# Patient Record
Sex: Female | Born: 1975
Health system: Southern US, Community
[De-identification: ages and names within clinical notes are randomized; demographics above are authoritative.]

## PROBLEM LIST (undated history)

## (undated) ENCOUNTER — Inpatient Hospital Stay (HOSPITAL_COMMUNITY): Payer: Self-pay

## (undated) DIAGNOSIS — R112 Nausea with vomiting, unspecified: Secondary | ICD-10-CM

## (undated) DIAGNOSIS — Z9889 Other specified postprocedural states: Secondary | ICD-10-CM

## (undated) DIAGNOSIS — O26619 Liver and biliary tract disorders in pregnancy, unspecified trimester: Secondary | ICD-10-CM

## (undated) DIAGNOSIS — N809 Endometriosis, unspecified: Secondary | ICD-10-CM

## (undated) DIAGNOSIS — C9590 Leukemia, unspecified not having achieved remission: Secondary | ICD-10-CM

## (undated) DIAGNOSIS — Z923 Personal history of irradiation: Secondary | ICD-10-CM

## (undated) DIAGNOSIS — Z800 Family history of malignant neoplasm of digestive organs: Secondary | ICD-10-CM

## (undated) DIAGNOSIS — Z806 Family history of leukemia: Secondary | ICD-10-CM

## (undated) DIAGNOSIS — Z5189 Encounter for other specified aftercare: Secondary | ICD-10-CM

## (undated) DIAGNOSIS — C50919 Malignant neoplasm of unspecified site of unspecified female breast: Secondary | ICD-10-CM

## (undated) DIAGNOSIS — D219 Benign neoplasm of connective and other soft tissue, unspecified: Secondary | ICD-10-CM

## (undated) DIAGNOSIS — K831 Obstruction of bile duct: Secondary | ICD-10-CM

## (undated) HISTORY — DX: Endometriosis, unspecified: N80.9

## (undated) HISTORY — PX: NO PAST SURGERIES: SHX2092

## (undated) HISTORY — DX: Benign neoplasm of connective and other soft tissue, unspecified: D21.9

## (undated) HISTORY — DX: Malignant neoplasm of unspecified site of unspecified female breast: C50.919

## (undated) HISTORY — DX: Family history of leukemia: Z80.6

## (undated) HISTORY — DX: Encounter for other specified aftercare: Z51.89

## (undated) HISTORY — DX: Family history of malignant neoplasm of digestive organs: Z80.0

---
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
## 2009-05-22 ENCOUNTER — Encounter (INDEPENDENT_AMBULATORY_CARE_PROVIDER_SITE_OTHER): Payer: Self-pay | Admitting: *Deleted

## 2009-05-22 ENCOUNTER — Ambulatory Visit (HOSPITAL_COMMUNITY): Admission: RE | Admit: 2009-05-22 | Discharge: 2009-05-22 | Payer: Self-pay | Admitting: *Deleted

## 2011-04-09 NOTE — Op Note (Signed)
NAMEALONZO, LOVING NO.:  [id number]   MEDICAL RECORD NO.:  [id number]          PATIENT TYPE:  AMB   LOCATION:  ENDO                         FACILITY:  [facility]   PHYSICIAN:  Georgiana Spinner, M.D.    DATE OF BIRTH:  04-06-1976   DATE OF PROCEDURE:  DATE OF DISCHARGE:                               OPERATIVE REPORT   PROCEDURE:  Colonoscopy.   INDICATIONS:  Colon cancer screening.  Mother with history of colon  cancer at an early age.   ANESTHESIA:  Fentanyl 125 mcg, Versed 12 mg.   PROCEDURE:  With the patient mildly sedated in the left lateral  decubitus position the Pentax videoscopic pediatric colonoscope was  inserted in the rectum and passed under direct vision to the cecum,  identified by ileocecal valve and appendiceal orifice, crow's foot of  the cecum all of which was photographed.  From this point, the  colonoscope was slowly withdrawn taking circumferential views of the  colonic mucosa stopping first at 50 cm from the anal verge at which  point a polyp was seen, photographed and removed using hot biopsy  forceps technique setting of 20/150 blended current.  We removed all the  tissue that we could and removed it in its entirety.  Then we withdrew  to the rectum where a second polyp was seen.  It too was photographed  and it too was removed, but this time using snare cautery technique with  the same setting of 20/150 blended current.  The endoscope was then  placed in retroflexion to view the anal canal from above.  The endoscope  was straightened and withdrawn.  The patient's vital signs and pulse  oximeter remained stable.  The patient tolerated the procedure well  without apparent complication.   FINDINGS:  1. Polyp of rectum.  2. Polyp at 50 cm from anal verge.   PLAN:  Await biopsy report.  The patient will call me for results and  follow-up with me as an outpatient.           ______________________________  Georgiana Spinner, M.D.     GMO/MEDQ  D:  05/22/2009  T:  05/22/2009  Job:  [id number]

## 2013-03-30 ENCOUNTER — Other Ambulatory Visit: Payer: Self-pay | Admitting: Obstetrics and Gynecology

## 2013-04-07 ENCOUNTER — Other Ambulatory Visit: Payer: Self-pay | Admitting: Internal Medicine

## 2013-04-07 DIAGNOSIS — E049 Nontoxic goiter, unspecified: Secondary | ICD-10-CM

## 2013-04-08 ENCOUNTER — Other Ambulatory Visit: Payer: Self-pay

## 2013-04-12 ENCOUNTER — Ambulatory Visit
Admission: RE | Admit: 2013-04-12 | Discharge: 2013-04-12 | Disposition: A | Discharge status: Home / Self Care | Payer: BC Managed Care – PPO | Source: Ambulatory Visit | Attending: Internal Medicine | Admitting: Internal Medicine

## 2013-04-12 DIAGNOSIS — E049 Nontoxic goiter, unspecified: Secondary | ICD-10-CM

## 2014-02-28 ENCOUNTER — Other Ambulatory Visit (HOSPITAL_COMMUNITY): Payer: Self-pay | Admitting: Endocrinology

## 2014-02-28 DIAGNOSIS — E0590 Thyrotoxicosis, unspecified without thyrotoxic crisis or storm: Secondary | ICD-10-CM

## 2014-03-08 ENCOUNTER — Ambulatory Visit (HOSPITAL_COMMUNITY): Payer: BC Managed Care – PPO

## 2014-03-08 ENCOUNTER — Encounter (HOSPITAL_COMMUNITY): Admission: RE | Admit: 2014-03-08 | Payer: BC Managed Care – PPO | Source: Ambulatory Visit

## 2014-03-08 ENCOUNTER — Encounter (HOSPITAL_COMMUNITY)
Admission: RE | Admit: 2014-03-08 | Discharge: 2014-03-08 | Disposition: A | Discharge status: Home / Self Care | Payer: BC Managed Care – PPO | Source: Ambulatory Visit | Attending: Endocrinology | Admitting: Endocrinology

## 2014-03-08 DIAGNOSIS — E0590 Thyrotoxicosis, unspecified without thyrotoxic crisis or storm: Secondary | ICD-10-CM

## 2014-03-08 DIAGNOSIS — R450 Nervousness: Secondary | ICD-10-CM | POA: Insufficient documentation

## 2014-03-08 DIAGNOSIS — G478 Other sleep disorders: Secondary | ICD-10-CM | POA: Insufficient documentation

## 2014-03-08 MED ORDER — SODIUM IODIDE I 131 CAPSULE
16.7000 | Freq: Once | INTRAVENOUS | Status: AC | PRN
  Administered 2014-03-08: 16.7 via ORAL

## 2014-03-09 ENCOUNTER — Ambulatory Visit (HOSPITAL_COMMUNITY): Payer: BC Managed Care – PPO

## 2014-03-09 ENCOUNTER — Encounter (HOSPITAL_COMMUNITY)
Admission: RE | Admit: 2014-03-09 | Discharge: 2014-03-09 | Disposition: A | Discharge status: Home / Self Care | Payer: BC Managed Care – PPO | Source: Ambulatory Visit | Attending: Endocrinology | Admitting: Endocrinology

## 2014-03-09 MED ORDER — SODIUM PERTECHNETATE TC 99M INJECTION
10.0000 | Freq: Once | INTRAVENOUS | Status: AC | PRN
  Administered 2014-03-09: 10 via INTRAVENOUS

## 2016-02-29 DIAGNOSIS — R109 Unspecified abdominal pain: Secondary | ICD-10-CM | POA: Diagnosis not present

## 2016-04-01 DIAGNOSIS — Z1231 Encounter for screening mammogram for malignant neoplasm of breast: Secondary | ICD-10-CM | POA: Diagnosis not present

## 2016-04-01 DIAGNOSIS — Z01419 Encounter for gynecological examination (general) (routine) without abnormal findings: Secondary | ICD-10-CM | POA: Diagnosis not present

## 2016-04-01 DIAGNOSIS — Z6824 Body mass index (BMI) 24.0-24.9, adult: Secondary | ICD-10-CM | POA: Diagnosis not present

## 2016-04-01 DIAGNOSIS — Z3200 Encounter for pregnancy test, result unknown: Secondary | ICD-10-CM | POA: Diagnosis not present

## 2016-08-26 DIAGNOSIS — N911 Secondary amenorrhea: Secondary | ICD-10-CM | POA: Diagnosis not present

## 2016-09-04 DIAGNOSIS — Z3481 Encounter for supervision of other normal pregnancy, first trimester: Secondary | ICD-10-CM | POA: Diagnosis not present

## 2016-09-04 DIAGNOSIS — Z3A08 8 weeks gestation of pregnancy: Secondary | ICD-10-CM | POA: Diagnosis not present

## 2016-09-04 LAB — OB RESULTS CONSOLE ANTIBODY SCREEN: Antibody Screen: NEGATIVE

## 2016-09-04 LAB — OB RESULTS CONSOLE RUBELLA ANTIBODY, IGM: Rubella: IMMUNE

## 2016-09-04 LAB — OB RESULTS CONSOLE RPR: RPR: NONREACTIVE

## 2016-09-04 LAB — OB RESULTS CONSOLE HEPATITIS B SURFACE ANTIGEN: Hepatitis B Surface Ag: NEGATIVE

## 2016-09-04 LAB — OB RESULTS CONSOLE ABO/RH: RH Type: POSITIVE

## 2016-09-04 LAB — OB RESULTS CONSOLE HIV ANTIBODY (ROUTINE TESTING): HIV: NONREACTIVE

## 2016-09-19 DIAGNOSIS — E789 Disorder of lipoprotein metabolism, unspecified: Secondary | ICD-10-CM | POA: Diagnosis not present

## 2016-09-19 DIAGNOSIS — R5383 Other fatigue: Secondary | ICD-10-CM | POA: Diagnosis not present

## 2016-09-19 DIAGNOSIS — E118 Type 2 diabetes mellitus with unspecified complications: Secondary | ICD-10-CM | POA: Diagnosis not present

## 2016-09-19 DIAGNOSIS — I10 Essential (primary) hypertension: Secondary | ICD-10-CM | POA: Diagnosis not present

## 2016-09-23 DIAGNOSIS — Z3491 Encounter for supervision of normal pregnancy, unspecified, first trimester: Secondary | ICD-10-CM | POA: Diagnosis not present

## 2016-09-23 DIAGNOSIS — Z113 Encounter for screening for infections with a predominantly sexual mode of transmission: Secondary | ICD-10-CM | POA: Diagnosis not present

## 2016-09-23 DIAGNOSIS — Z3480 Encounter for supervision of other normal pregnancy, unspecified trimester: Secondary | ICD-10-CM | POA: Diagnosis not present

## 2016-09-23 LAB — OB RESULTS CONSOLE GC/CHLAMYDIA
Chlamydia: NEGATIVE
Gonorrhea: NEGATIVE

## 2016-09-24 DIAGNOSIS — R899 Unspecified abnormal finding in specimens from other organs, systems and tissues: Secondary | ICD-10-CM | POA: Diagnosis not present

## 2016-10-02 DIAGNOSIS — O09521 Supervision of elderly multigravida, first trimester: Secondary | ICD-10-CM | POA: Diagnosis not present

## 2016-10-02 DIAGNOSIS — Z3682 Encounter for antenatal screening for nuchal translucency: Secondary | ICD-10-CM | POA: Diagnosis not present

## 2016-10-23 DIAGNOSIS — Z3480 Encounter for supervision of other normal pregnancy, unspecified trimester: Secondary | ICD-10-CM | POA: Diagnosis not present

## 2016-10-24 DIAGNOSIS — E0789 Other specified disorders of thyroid: Secondary | ICD-10-CM | POA: Diagnosis not present

## 2016-10-24 DIAGNOSIS — E034 Atrophy of thyroid (acquired): Secondary | ICD-10-CM | POA: Diagnosis not present

## 2016-11-13 DIAGNOSIS — Z363 Encounter for antenatal screening for malformations: Secondary | ICD-10-CM | POA: Diagnosis not present

## 2016-11-13 DIAGNOSIS — Z3A18 18 weeks gestation of pregnancy: Secondary | ICD-10-CM | POA: Diagnosis not present

## 2016-11-17 ENCOUNTER — Inpatient Hospital Stay (HOSPITAL_COMMUNITY)
Admission: AD | Admit: 2016-11-17 | Discharge: 2016-11-17 | Disposition: A | Discharge status: Home / Self Care | Payer: BLUE CROSS/BLUE SHIELD | Source: Ambulatory Visit | Attending: Obstetrics and Gynecology | Admitting: Obstetrics and Gynecology

## 2016-11-17 ENCOUNTER — Encounter (HOSPITAL_COMMUNITY): Payer: Self-pay | Admitting: *Deleted

## 2016-11-17 DIAGNOSIS — O26892 Other specified pregnancy related conditions, second trimester: Secondary | ICD-10-CM | POA: Insufficient documentation

## 2016-11-17 DIAGNOSIS — R102 Pelvic and perineal pain: Secondary | ICD-10-CM | POA: Diagnosis not present

## 2016-11-17 DIAGNOSIS — Z87891 Personal history of nicotine dependence: Secondary | ICD-10-CM | POA: Insufficient documentation

## 2016-11-17 DIAGNOSIS — O09522 Supervision of elderly multigravida, second trimester: Secondary | ICD-10-CM | POA: Diagnosis not present

## 2016-11-17 DIAGNOSIS — Z3A19 19 weeks gestation of pregnancy: Secondary | ICD-10-CM | POA: Insufficient documentation

## 2016-11-17 DIAGNOSIS — Z856 Personal history of leukemia: Secondary | ICD-10-CM | POA: Diagnosis not present

## 2016-11-17 DIAGNOSIS — R109 Unspecified abdominal pain: Secondary | ICD-10-CM | POA: Diagnosis present

## 2016-11-17 DIAGNOSIS — N949 Unspecified condition associated with female genital organs and menstrual cycle: Secondary | ICD-10-CM | POA: Diagnosis not present

## 2016-11-17 HISTORY — DX: Leukemia, unspecified not having achieved remission: C95.90

## 2016-11-17 LAB — URINALYSIS, ROUTINE W REFLEX MICROSCOPIC
Bilirubin Urine: NEGATIVE
Glucose, UA: NEGATIVE mg/dL
Hgb urine dipstick: NEGATIVE
Ketones, ur: NEGATIVE mg/dL
Leukocytes, UA: NEGATIVE
Nitrite: NEGATIVE
Protein, ur: NEGATIVE mg/dL
Specific Gravity, Urine: 1.008 (ref 1.005–1.030)
pH: 6 (ref 5.0–8.0)

## 2016-11-17 NOTE — Discharge Instructions (Signed)
Round Ligament Pain Introduction The round ligament is a cord of muscle and tissue that helps to support the uterus. It can become a source of pain during pregnancy if it becomes stretched or twisted as the baby grows. The pain usually begins in the second trimester of pregnancy, and it can come and go until the baby is delivered. It is not a serious problem, and it does not cause harm to the baby. Round ligament pain is usually a short, sharp, and pinching pain, but it can also be a dull, lingering, and aching pain. The pain is felt in the lower side of the abdomen or in the groin. It usually starts deep in the groin and moves up to the outside of the hip area. Pain can occur with:  A sudden change in position.  Rolling over in bed.  Coughing or sneezing.  Physical activity. Follow these instructions at home: Watch your condition for any changes. Take these steps to help with your pain:  When the pain starts, relax. Then try:  Sitting down.  Flexing your knees up to your abdomen.  Lying on your side with one pillow under your abdomen and another pillow between your legs.  Sitting in a warm bath for 15-20 minutes or until the pain goes away.  Take over-the-counter and prescription medicines only as told by your health care provider.  Move slowly when you sit and stand.  Avoid long walks if they cause pain.  Stop or lessen your physical activities if they cause pain. Contact a health care provider if:  Your pain does not go away with treatment.  You feel pain in your back that you did not have before.  Your medicine is not helping. Get help right away if:  You develop a fever or chills.  You develop uterine contractions.  You develop vaginal bleeding.  You develop nausea or vomiting.  You develop diarrhea.  You have pain when you urinate. This information is not intended to replace advice given to you by your health care provider. Make sure you discuss any questions  you have with your health care provider. Document Released: 08/20/2008 Document Revised: 04/18/2016 Document Reviewed: 01/18/2015  2017 Elsevier  

## 2016-11-17 NOTE — MAU Provider Note (Signed)
History     CSN: [id number]  Arrival date and time: 11/17/16 1536   First Provider Initiated Contact with Patient 11/17/16 1619      Chief Complaint  Patient presents with  . Abdominal Pain  . Vaginal Pain   HPI   [name] is a 40 y.o. female G2P1001 @ [redacted]w[redacted]d here in MAU with cold like symptoms and abdominal pain. She has had a cold X 3 days; congestion like. Around lunch time she started experiencing lower abdominal pain that comes every 20-30 minutes. The pain when it comes is very intense. She denies vaginal bleeding. + inconsistent movements.  Standing makes the pain worse. Sitting makes the pain improve.   Denies history of preterm delivery or labor   OB History    Gravida Para Term Preterm AB Living   2 1 1     1    SAB TAB Ectopic Multiple Live Births           1      Past Medical History:  Diagnosis Date  . Leukemia [facility])     Past Surgical History:  Procedure Laterality Date  . NO PAST SURGERIES      History reviewed. No pertinent family history.  Social History  Substance Use Topics  . Smoking status: Former Research scientist (life sciences)  . Smokeless tobacco: Never Used  . Alcohol use No    Allergies: No Known Allergies  Prescriptions Prior to Admission  Medication Sig Dispense Refill Last Dose  . acetaminophen (TYLENOL) 500 MG tablet Take 1,000 mg by mouth every 6 (six) hours as needed for moderate pain.   Past Week at Unknown time  . docusate sodium (COLACE) 100 MG capsule Take 100 mg by mouth daily.   11/16/2016 at Unknown time  . prenatal vitamin w/FE, FA (PRENATAL 1 + 1) 27-1 MG TABS tablet Take 1 tablet by mouth daily at 12 noon.   11/16/2016 at Unknown time   Results for orders placed or performed during the hospital encounter of 11/17/16 (from the past 48 hour(s))  Urinalysis, Routine w reflex microscopic     Status: Abnormal   Collection Time: 11/17/16  3:55 PM  Result Value Ref Range   Color, Urine STRAW (A) YELLOW   APPearance CLEAR CLEAR   Specific  Gravity, Urine 1.008 1.005 - 1.030   pH 6.0 5.0 - 8.0   Glucose, UA NEGATIVE NEGATIVE mg/dL   Hgb urine dipstick NEGATIVE NEGATIVE   Bilirubin Urine NEGATIVE NEGATIVE   Ketones, ur NEGATIVE NEGATIVE mg/dL   Protein, ur NEGATIVE NEGATIVE mg/dL   Nitrite NEGATIVE NEGATIVE   Leukocytes, UA NEGATIVE NEGATIVE    Review of Systems  Constitutional: Negative for fever.  Gastrointestinal: Negative for constipation and diarrhea.  Genitourinary: Negative for dysuria.   Physical Exam   Blood pressure 112/56, pulse 89, temperature 98.4 F (36.9 C), temperature source Oral, resp. rate 18.  Physical Exam  Constitutional: She is oriented to person, place, and time. She appears well-developed and well-nourished. No distress.  HENT:  Head: Normocephalic.  Eyes: Pupils are equal, round, and reactive to light.  GI: Soft. She exhibits no distension. There is no tenderness. There is no rebound.  Genitourinary:  Genitourinary Comments: Dilation: Closed Effacement (%): Thick Cervical Position: Posterior Exam by:: Lenna Sciara Demosthenes Virnig, NP  Musculoskeletal: Normal range of motion.  Neurological: She is alert and oriented to person, place, and time.  Skin: Skin is warm. She is not diaphoretic.  Psychiatric: Her behavior is normal.    MAU Course  Procedures  MDM  UA Discussed patient with Dr. Gaetano Net  + fetal hear tones via doppler    Assessment and Plan    A:  1. Round ligament pain     P:  Discharge home in stable condition Return if symptoms worsen Pregnancy support belt encouraged Over the counter medication list given.   Lezlie Lye, NP 11/17/2016 6:55 PM

## 2016-11-17 NOTE — MAU Note (Signed)
Got sick with cold, last 2 days.  about 4 hrs started having pain in lower abd.  Is not constant, comes about every 44min.  Sharp pains, esp when standing or walking.  Also having pain in her vagina

## 2016-11-21 DIAGNOSIS — E034 Atrophy of thyroid (acquired): Secondary | ICD-10-CM | POA: Diagnosis not present

## 2016-11-25 NOTE — L&D Delivery Note (Signed)
Delivery Note  SVD viable female Apgars 8,9 over intact perineum.  Placenta delivered spontaneously intact with 3VC. Good support and hemostasis noted and R/V exam confirms.  PH art was sent. I was unable to see a cervical polyp which had been previously noted  Mother and baby were doing well.  EBL 225cc  [name], MD

## 2016-12-18 DIAGNOSIS — L299 Pruritus, unspecified: Secondary | ICD-10-CM | POA: Diagnosis not present

## 2016-12-23 DIAGNOSIS — E0789 Other specified disorders of thyroid: Secondary | ICD-10-CM | POA: Diagnosis not present

## 2016-12-26 DIAGNOSIS — E0789 Other specified disorders of thyroid: Secondary | ICD-10-CM | POA: Diagnosis not present

## 2017-01-07 DIAGNOSIS — O368120 Decreased fetal movements, second trimester, not applicable or unspecified: Secondary | ICD-10-CM | POA: Diagnosis not present

## 2017-01-07 DIAGNOSIS — Z3A26 26 weeks gestation of pregnancy: Secondary | ICD-10-CM | POA: Diagnosis not present

## 2017-01-08 DIAGNOSIS — Z3A26 26 weeks gestation of pregnancy: Secondary | ICD-10-CM | POA: Diagnosis not present

## 2017-01-08 DIAGNOSIS — Z36 Encounter for antenatal screening for chromosomal anomalies: Secondary | ICD-10-CM | POA: Diagnosis not present

## 2017-01-08 DIAGNOSIS — Z3480 Encounter for supervision of other normal pregnancy, unspecified trimester: Secondary | ICD-10-CM | POA: Diagnosis not present

## 2017-01-08 DIAGNOSIS — O26612 Liver and biliary tract disorders in pregnancy, second trimester: Secondary | ICD-10-CM | POA: Diagnosis not present

## 2017-01-13 DIAGNOSIS — Z3480 Encounter for supervision of other normal pregnancy, unspecified trimester: Secondary | ICD-10-CM | POA: Diagnosis not present

## 2017-01-13 DIAGNOSIS — O09522 Supervision of elderly multigravida, second trimester: Secondary | ICD-10-CM | POA: Diagnosis not present

## 2017-01-13 DIAGNOSIS — Z3A27 27 weeks gestation of pregnancy: Secondary | ICD-10-CM | POA: Diagnosis not present

## 2017-01-14 ENCOUNTER — Other Ambulatory Visit: Payer: Self-pay

## 2017-01-14 ENCOUNTER — Encounter (HOSPITAL_COMMUNITY): Payer: Self-pay

## 2017-01-14 ENCOUNTER — Ambulatory Visit (HOSPITAL_COMMUNITY)
Admission: RE | Admit: 2017-01-14 | Discharge: 2017-01-14 | Disposition: A | Discharge status: Home / Self Care | Payer: BLUE CROSS/BLUE SHIELD | Source: Ambulatory Visit | Attending: Obstetrics and Gynecology | Admitting: Obstetrics and Gynecology

## 2017-01-14 VITALS — BP 116/65 | HR 82 | Wt 169.1 lb

## 2017-01-14 DIAGNOSIS — O26612 Liver and biliary tract disorders in pregnancy, second trimester: Secondary | ICD-10-CM | POA: Diagnosis not present

## 2017-01-14 DIAGNOSIS — Z3A27 27 weeks gestation of pregnancy: Secondary | ICD-10-CM | POA: Insufficient documentation

## 2017-01-14 DIAGNOSIS — K8020 Calculus of gallbladder without cholecystitis without obstruction: Secondary | ICD-10-CM | POA: Diagnosis not present

## 2017-01-14 DIAGNOSIS — Z856 Personal history of leukemia: Secondary | ICD-10-CM | POA: Insufficient documentation

## 2017-01-14 DIAGNOSIS — E041 Nontoxic single thyroid nodule: Secondary | ICD-10-CM | POA: Diagnosis not present

## 2017-01-14 DIAGNOSIS — Z87891 Personal history of nicotine dependence: Secondary | ICD-10-CM | POA: Diagnosis not present

## 2017-01-14 DIAGNOSIS — K831 Obstruction of bile duct: Secondary | ICD-10-CM | POA: Diagnosis not present

## 2017-01-14 DIAGNOSIS — O99612 Diseases of the digestive system complicating pregnancy, second trimester: Secondary | ICD-10-CM

## 2017-01-14 HISTORY — DX: Liver and biliary tract disorders in pregnancy, unspecified trimester: O26.619

## 2017-01-14 HISTORY — DX: Obstruction of bile duct: K83.1

## 2017-01-14 NOTE — Progress Notes (Signed)
41 year old, G2P1001, currently at 27 weeks 4 days gestation seen in evaluation for management plan for cholestasis of pregnancy.   She notes that her first pregnancy was relatively uncomplicated. She first noticed itching at ~13-[redacted] week gestation which has gotten progressively worse. She had Bile Acids drawn on 12/18/2016 which showed Total Bile Acids of 99991111, Cholic Acid of 8.1, Deoxycholic Acid of 9.4 and Chenodeoxycholic Acid of 8.9. She was placed on Ursodiol 300mg  orallly BID on 30 December 2016 and has noticed a progressive decline in the itching, although still has some mild itching, particularly at night. She has noticed a slight decrease in the fetal movement since starting this medication, but is still <[redacted] weeks gestation with an anterior placenta noted on the providers office ultrasound.   Past Medical History:  Diagnosis Date  . Cholestasis during pregnancy   . Leukemia [facility])    Past Surgical History:  Procedure Laterality Date  . NO PAST SURGERIES     Obstetric History   G2   P1   T1   P0   A0   L1    SAB0   TAB0   Ectopic0   Multiple0   Live Births1     # Outcome Date GA Lbr Len/2nd Weight Sex Delivery Anes PTL Lv  2 Current           1 Term 08/10/11     Vag-Spont        Social History   Social History  . Marital status: Single    Spouse name: N/A  . Number of children: N/A  . Years of education: N/A   Social History Main Topics  . Smoking status: Former Research scientist (life sciences)  . Smokeless tobacco: Never Used  . Alcohol use No  . Drug use: No  . Sexual activity: Yes    Birth control/ protection: None   Other Topics Concern  . None   Social History Narrative  . None   No Known Allergies   Current Outpatient Prescriptions:  .  acetaminophen (TYLENOL) 500 MG tablet, Take 1,000 mg by mouth every 6 (six) hours as needed for moderate pain., Disp: , Rfl:  .  docusate sodium (COLACE) 100 MG capsule, Take 100 mg by mouth daily., Disp: , Rfl:  .  prenatal vitamin w/FE, FA  (PRENATAL 1 + 1) 27-1 MG TABS tablet, Take 1 tablet by mouth daily at 12 noon., Disp: , Rfl:  .  ursodiol (ACTIGALL) 300 MG capsule, Take 300 mg by mouth 2 (two) times daily., Disp: , Rfl:    Impression / Recommendations  1) Intrahepatic Cholestasis of Pregnancy - Currently on Ursodiol therapy.  -  Ursodiol is the preferred treatment of ICP and results in complete resolution of pruritus in approximately 42 percent of patients and an improvement in pruritus in approximately 61 percent. In addition, it improves laboratory abnormalities associated with ICP in both the maternal and fetal compartments, may improve perinatal outcome, and has no known fetal/neonatal toxicity.  - The optimal starting dose has not been determined; you can prescribe 15 mg/kg per day in three divided doses until delivery, but 300 mg twice daily often works. The drug is well-tolerated by most patients, but mild nausea and dizziness have been reported in up to 25 percent of patients. A decrease in pruritus is usually seen within one to two weeks, and biochemical improvement is usually seen within three to four weeks. If pruritus is not relieved to a tolerable level within about two weeks, the  dose is titrated every week or two to symptoms, to a maximum dose of 21 mg/kg per day.  - We follow all pregnancies with ICP with twice weekly biophysical profiles with NST, although the value of antepartum fetal testing to identify fetuses at risk of demise in the setting of ICP is unproven. Nonstress tests and other tests (biophysical profile score, daily fetal kick count) for detection of the effects of chronic placental insufficiency on the fetus may not be useful in ICP because the mechanism of intrauterine fetal demise is thought to be a sudden event rather than the result of a chronic placental vascular process. However, in the absence of high-quality evidence of the lack of value of fetal testing or a proven mechanism for fetal death, many  obstetricians order antepartum testing in women with ICP to detect the rare test suggesting fetal compromise and the need for immediate delivery.  - We favor early delivery to reduce the risk of fetal demise and to initiate disease resolution. The timing should be guided by balancing the risk of fetal death with expectant management against the potential risks of prematurity with early delivery. We deliver most women with ICP at 36+0 to 36+6 weeks of gestation without performing an amniocentesis to check fetal pulmonary maturity prior to delivery. We consider delivery prior to 36 weeks of gestation in women with ICP and excruciating and unremitting maternal pruritus not relieved with pharmacotherapy or jaundice.  - No special considerations related to delivery are required in women with ICP. Continuous fetal monitoring during labor is indicated, given increased frequency of fetal death and non-fatal asphyxial events. Labor induction does not necessarily lead to an increased risk of cesarean delivery compared with expectant management. There does not appear to be an increased risk for postpartum hemorrhage when ICP is managed with Ursodiol. Therefore, we do not routinely assess coagulation parameters or prescribe vitamin K before delivery. In rare refractory cases, the prothrombin time can be checked and vitamin K administered if it is prolonged  2) Thyroid Nodule - Getting monthly TFT's and will get nodule further evaluated after delivery 3) AMA - Had NIPS WNL. Declines further testing  Questions appear answered to her satisfaction. Precautions for the above given. Spent greater than 1/2 of 35 minute visit face to face counseling.

## 2017-01-17 DIAGNOSIS — Z3A28 28 weeks gestation of pregnancy: Secondary | ICD-10-CM | POA: Diagnosis not present

## 2017-01-17 DIAGNOSIS — O9989 Other specified diseases and conditions complicating pregnancy, childbirth and the puerperium: Secondary | ICD-10-CM | POA: Diagnosis not present

## 2017-01-20 DIAGNOSIS — O9989 Other specified diseases and conditions complicating pregnancy, childbirth and the puerperium: Secondary | ICD-10-CM | POA: Diagnosis not present

## 2017-01-20 DIAGNOSIS — Z3A28 28 weeks gestation of pregnancy: Secondary | ICD-10-CM | POA: Diagnosis not present

## 2017-01-23 DIAGNOSIS — O26613 Liver and biliary tract disorders in pregnancy, third trimester: Secondary | ICD-10-CM | POA: Diagnosis not present

## 2017-01-23 DIAGNOSIS — E0789 Other specified disorders of thyroid: Secondary | ICD-10-CM | POA: Diagnosis not present

## 2017-01-23 DIAGNOSIS — Z3A28 28 weeks gestation of pregnancy: Secondary | ICD-10-CM | POA: Diagnosis not present

## 2017-01-27 DIAGNOSIS — H01024 Squamous blepharitis left upper eyelid: Secondary | ICD-10-CM | POA: Diagnosis not present

## 2017-01-27 DIAGNOSIS — H01021 Squamous blepharitis right upper eyelid: Secondary | ICD-10-CM | POA: Diagnosis not present

## 2017-01-27 DIAGNOSIS — D3132 Benign neoplasm of left choroid: Secondary | ICD-10-CM | POA: Diagnosis not present

## 2017-01-27 DIAGNOSIS — H01022 Squamous blepharitis right lower eyelid: Secondary | ICD-10-CM | POA: Diagnosis not present

## 2017-01-28 DIAGNOSIS — Z3A29 29 weeks gestation of pregnancy: Secondary | ICD-10-CM | POA: Diagnosis not present

## 2017-01-28 DIAGNOSIS — O26613 Liver and biliary tract disorders in pregnancy, third trimester: Secondary | ICD-10-CM | POA: Diagnosis not present

## 2017-01-28 DIAGNOSIS — O368330 Maternal care for abnormalities of the fetal heart rate or rhythm, third trimester, not applicable or unspecified: Secondary | ICD-10-CM | POA: Diagnosis not present

## 2017-01-31 DIAGNOSIS — Z3A30 30 weeks gestation of pregnancy: Secondary | ICD-10-CM | POA: Diagnosis not present

## 2017-01-31 DIAGNOSIS — Z3480 Encounter for supervision of other normal pregnancy, unspecified trimester: Secondary | ICD-10-CM | POA: Diagnosis not present

## 2017-01-31 DIAGNOSIS — O3663X0 Maternal care for excessive fetal growth, third trimester, not applicable or unspecified: Secondary | ICD-10-CM | POA: Diagnosis not present

## 2017-01-31 DIAGNOSIS — O09523 Supervision of elderly multigravida, third trimester: Secondary | ICD-10-CM | POA: Diagnosis not present

## 2017-02-04 DIAGNOSIS — O9989 Other specified diseases and conditions complicating pregnancy, childbirth and the puerperium: Secondary | ICD-10-CM | POA: Diagnosis not present

## 2017-02-07 DIAGNOSIS — Z3A31 31 weeks gestation of pregnancy: Secondary | ICD-10-CM | POA: Diagnosis not present

## 2017-02-07 DIAGNOSIS — O09522 Supervision of elderly multigravida, second trimester: Secondary | ICD-10-CM | POA: Diagnosis not present

## 2017-02-11 DIAGNOSIS — Z3A31 31 weeks gestation of pregnancy: Secondary | ICD-10-CM | POA: Diagnosis not present

## 2017-02-11 DIAGNOSIS — O09522 Supervision of elderly multigravida, second trimester: Secondary | ICD-10-CM | POA: Diagnosis not present

## 2017-02-14 DIAGNOSIS — O9989 Other specified diseases and conditions complicating pregnancy, childbirth and the puerperium: Secondary | ICD-10-CM | POA: Diagnosis not present

## 2017-02-14 DIAGNOSIS — Z3A32 32 weeks gestation of pregnancy: Secondary | ICD-10-CM | POA: Diagnosis not present

## 2017-02-18 DIAGNOSIS — O9989 Other specified diseases and conditions complicating pregnancy, childbirth and the puerperium: Secondary | ICD-10-CM | POA: Diagnosis not present

## 2017-02-18 DIAGNOSIS — Z3A32 32 weeks gestation of pregnancy: Secondary | ICD-10-CM | POA: Diagnosis not present

## 2017-02-20 DIAGNOSIS — Z3A32 32 weeks gestation of pregnancy: Secondary | ICD-10-CM | POA: Diagnosis not present

## 2017-02-24 DIAGNOSIS — Z3A33 33 weeks gestation of pregnancy: Secondary | ICD-10-CM | POA: Diagnosis not present

## 2017-02-24 DIAGNOSIS — O368330 Maternal care for abnormalities of the fetal heart rate or rhythm, third trimester, not applicable or unspecified: Secondary | ICD-10-CM | POA: Diagnosis not present

## 2017-02-28 DIAGNOSIS — Z3A34 34 weeks gestation of pregnancy: Secondary | ICD-10-CM | POA: Diagnosis not present

## 2017-02-28 DIAGNOSIS — O9989 Other specified diseases and conditions complicating pregnancy, childbirth and the puerperium: Secondary | ICD-10-CM | POA: Diagnosis not present

## 2017-03-01 ENCOUNTER — Inpatient Hospital Stay (HOSPITAL_COMMUNITY): Payer: BLUE CROSS/BLUE SHIELD

## 2017-03-01 ENCOUNTER — Inpatient Hospital Stay (HOSPITAL_COMMUNITY)
Admission: AD | Admit: 2017-03-01 | Discharge: 2017-03-01 | Disposition: A | Discharge status: Home / Self Care | Payer: BLUE CROSS/BLUE SHIELD | Source: Ambulatory Visit | Attending: Obstetrics & Gynecology | Admitting: Obstetrics & Gynecology

## 2017-03-01 ENCOUNTER — Encounter (HOSPITAL_COMMUNITY): Payer: Self-pay

## 2017-03-01 DIAGNOSIS — R1011 Right upper quadrant pain: Secondary | ICD-10-CM | POA: Diagnosis not present

## 2017-03-01 DIAGNOSIS — Z87891 Personal history of nicotine dependence: Secondary | ICD-10-CM | POA: Insufficient documentation

## 2017-03-01 DIAGNOSIS — O26613 Liver and biliary tract disorders in pregnancy, third trimester: Secondary | ICD-10-CM | POA: Diagnosis not present

## 2017-03-01 DIAGNOSIS — Z3A34 34 weeks gestation of pregnancy: Secondary | ICD-10-CM | POA: Diagnosis not present

## 2017-03-01 DIAGNOSIS — O26893 Other specified pregnancy related conditions, third trimester: Secondary | ICD-10-CM | POA: Diagnosis not present

## 2017-03-01 DIAGNOSIS — K831 Obstruction of bile duct: Secondary | ICD-10-CM | POA: Diagnosis not present

## 2017-03-01 DIAGNOSIS — R1110 Vomiting, unspecified: Secondary | ICD-10-CM | POA: Diagnosis not present

## 2017-03-01 DIAGNOSIS — Z856 Personal history of leukemia: Secondary | ICD-10-CM | POA: Insufficient documentation

## 2017-03-01 DIAGNOSIS — R109 Unspecified abdominal pain: Secondary | ICD-10-CM

## 2017-03-01 LAB — COMPREHENSIVE METABOLIC PANEL
ALT: 16 U/L (ref 14–54)
AST: 16 U/L (ref 15–41)
Albumin: 2.7 g/dL — ABNORMAL LOW (ref 3.5–5.0)
Alkaline Phosphatase: 63 U/L (ref 38–126)
Anion gap: 8 (ref 5–15)
BUN: 9 mg/dL (ref 6–20)
CO2: 20 mmol/L — ABNORMAL LOW (ref 22–32)
Calcium: 8 mg/dL — ABNORMAL LOW (ref 8.9–10.3)
Chloride: 108 mmol/L (ref 101–111)
Creatinine, Ser: 0.56 mg/dL (ref 0.44–1.00)
GFR calc Af Amer: >60 mL/min (ref >60)
GFR calc non Af Amer: >60 mL/min (ref >60)
Glucose, Bld: 91 mg/dL (ref 65–99)
Potassium: 3.7 mmol/L (ref 3.5–5.1)
Sodium: 136 mmol/L (ref 135–145)
Total Bilirubin: 0.2 mg/dL — ABNORMAL LOW (ref 0.3–1.2)
Total Protein: 5.8 g/dL — ABNORMAL LOW (ref 6.5–8.1)

## 2017-03-01 LAB — URINALYSIS, ROUTINE W REFLEX MICROSCOPIC
Bilirubin Urine: NEGATIVE
Glucose, UA: NEGATIVE mg/dL
Hgb urine dipstick: NEGATIVE
Ketones, ur: NEGATIVE mg/dL
Leukocytes, UA: NEGATIVE
Nitrite: NEGATIVE
Protein, ur: 30 mg/dL — AB
Specific Gravity, Urine: 1.023 (ref 1.005–1.030)
pH: 5 (ref 5.0–8.0)

## 2017-03-01 LAB — AMYLASE: Amylase: 52 U/L (ref 28–100)

## 2017-03-01 LAB — CBC
HCT: 35.8 % — ABNORMAL LOW (ref 36.0–46.0)
Hemoglobin: 11.9 g/dL — ABNORMAL LOW (ref 12.0–15.0)
MCH: 31.1 pg (ref 26.0–34.0)
MCHC: 33.2 g/dL (ref 30.0–36.0)
MCV: 93.5 fL (ref 78.0–100.0)
Platelets: 153 10*3/uL (ref 150–400)
RBC: 3.83 MIL/uL — ABNORMAL LOW (ref 3.87–5.11)
RDW: 14 % (ref 11.5–15.5)
WBC: 8.7 10*3/uL (ref 4.0–10.5)

## 2017-03-01 LAB — LIPASE, BLOOD: Lipase: 15 U/L (ref 11–51)

## 2017-03-01 NOTE — MAU Provider Note (Signed)
Joann Meadows is a 41 year old G2P1001 at 34 weeks and 1 day here with complaints of upper right-sided abdominal pain under her rib cage and nausea and vomiting 4 timse last night. Today she has felt nauseated but has not thrown up. She has not eaten today. She gets twice weekly testing for cholestasis of pregnancy and takes Actigall.   History     CSN: [id number]  Arrival date and time: 03/01/17 1049   First Provider Initiated Contact with Patient 03/01/17 1136      Chief Complaint  Patient presents with  . Nausea  . right hip pain   Abdominal Pain  This is a new problem. The current episode started yesterday. The onset quality is sudden. The problem occurs intermittently. The pain is located in the RUQ. The pain is at a severity of 6/10. The quality of the pain is aching. Pertinent negatives include no anorexia. The pain is aggravated by vomiting. The pain is relieved by nothing. She has tried nothing for the symptoms.    OB History    Gravida Para Term Preterm AB Living   2 1 1     1    SAB TAB Ectopic Multiple Live Births           1      Past Medical History:  Diagnosis Date  . Cholestasis during pregnancy   . Leukemia [facility])     Past Surgical History:  Procedure Laterality Date  . NO PAST SURGERIES      No family history on file.  Social History  Substance Use Topics  . Smoking status: Former Research scientist (life sciences)  . Smokeless tobacco: Never Used  . Alcohol use No    Allergies: No Known Allergies  Prescriptions Prior to Admission  Medication Sig Dispense Refill Last Dose  . acetaminophen (TYLENOL) 500 MG tablet Take 1,000 mg by mouth every 6 (six) hours as needed for mild pain, moderate pain, fever or headache.    Past Week at Unknown time  . docusate sodium (COLACE) 100 MG capsule Take 100 mg by mouth at bedtime.    02/28/2017 at Unknown time  . prenatal vitamin w/FE, FA (PRENATAL 1 + 1) 27-1 MG TABS tablet Take 1 tablet by mouth at bedtime.    Past Week at Unknown time  .  ursodiol (ACTIGALL) 300 MG capsule Take 300 mg by mouth 2 (two) times daily.   02/28/2017 at Unknown time    Review of Systems  Respiratory: Negative.   Cardiovascular: Negative.   Gastrointestinal: Positive for abdominal pain. Negative for anorexia.  Genitourinary: Negative.   Neurological: Negative.    Physical Exam   Blood pressure 107/73, pulse (!) 101, temperature 97.7 F (36.5 C), temperature source Oral, resp. rate 16, height 5\' 5"  (1.651 m), weight 79.4 kg (175 lb), last menstrual period 07/05/2016.  Physical Exam  Constitutional: She is oriented to person, place, and time. She appears well-developed.  HENT:  Head: Normocephalic.  Neck: Normal range of motion.  Respiratory: Effort normal.  GI: Soft. She exhibits no distension and no mass. There is no tenderness. There is no rebound and no guarding.  Musculoskeletal: Normal range of motion.  Neurological: She is alert and oriented to person, place, and time.  Skin: Skin is warm and dry.  Psychiatric: She has a normal mood and affect.  NEFG; cervix is closed and long.   MAU Course  Procedures  MDM NST: 135 bpm, moderate variability, present accels, no decels, irregular contractions UA: negative CMP/amylase/lipase  normal [place] of RUQ: gallbladder normal; liver has some benign hemangionas.   Assessment and Plan   1. Abdominal pain during pregnancy in third trimester   2. Cholestasis during pregnancy in third trimester    3. Patient stable for discharge; plans to keep follow up NST on 03-04-2017. Plan of care discussed with Dr. Lynnette Caffey, who agrees.   Mervyn Skeeters K[place]ra CNM 03/01/2017, 12:28 PM

## 2017-03-01 NOTE — Progress Notes (Addendum)
G2P1 @ 34.[redacted] wksga. Presents to triage for vomiting last night and right hip pain. Denies LOF or bleeding. +FM efm applied.   cholestasias with current pregnancy. On medications for the itching.   1137: Provider at bs assessing pt.   1140: Urine sent to lab  1143: SVE: ft.  FFN done but not sent until further order.   1215:Juice and crackers given. Pt tolerated it well  1236: received orders for lab and U/S 1238: U/S paged.  1239:  U/S returned page. Report status given 1240: Updated pt regards to POC.   1515: discharge instructions given with pt understanding. Pt left unit via ambulatory with family

## 2017-03-04 DIAGNOSIS — Z3A34 34 weeks gestation of pregnancy: Secondary | ICD-10-CM | POA: Diagnosis not present

## 2017-03-04 DIAGNOSIS — O368330 Maternal care for abnormalities of the fetal heart rate or rhythm, third trimester, not applicable or unspecified: Secondary | ICD-10-CM | POA: Diagnosis not present

## 2017-03-07 DIAGNOSIS — O9989 Other specified diseases and conditions complicating pregnancy, childbirth and the puerperium: Secondary | ICD-10-CM | POA: Diagnosis not present

## 2017-03-07 DIAGNOSIS — Z36 Encounter for antenatal screening for chromosomal anomalies: Secondary | ICD-10-CM | POA: Diagnosis not present

## 2017-03-07 DIAGNOSIS — Z3A35 35 weeks gestation of pregnancy: Secondary | ICD-10-CM | POA: Diagnosis not present

## 2017-03-11 DIAGNOSIS — Z3A35 35 weeks gestation of pregnancy: Secondary | ICD-10-CM | POA: Diagnosis not present

## 2017-03-11 DIAGNOSIS — O9989 Other specified diseases and conditions complicating pregnancy, childbirth and the puerperium: Secondary | ICD-10-CM | POA: Diagnosis not present

## 2017-03-13 LAB — OB RESULTS CONSOLE GBS: GBS: POSITIVE

## 2017-03-14 DIAGNOSIS — O368390 Maternal care for abnormalities of the fetal heart rate or rhythm, unspecified trimester, not applicable or unspecified: Secondary | ICD-10-CM | POA: Diagnosis not present

## 2017-03-17 ENCOUNTER — Telehealth (HOSPITAL_COMMUNITY): Payer: Self-pay | Admitting: *Deleted

## 2017-03-17 ENCOUNTER — Encounter (HOSPITAL_COMMUNITY): Payer: Self-pay | Admitting: *Deleted

## 2017-03-17 NOTE — Telephone Encounter (Signed)
Preadmission screen  

## 2017-03-18 DIAGNOSIS — O9989 Other specified diseases and conditions complicating pregnancy, childbirth and the puerperium: Secondary | ICD-10-CM | POA: Diagnosis not present

## 2017-03-18 DIAGNOSIS — Z6836 Body mass index (BMI) 36.0-36.9, adult: Secondary | ICD-10-CM | POA: Diagnosis not present

## 2017-03-21 ENCOUNTER — Inpatient Hospital Stay (HOSPITAL_COMMUNITY): Admission: RE | Admit: 2017-03-21 | Payer: BLUE CROSS/BLUE SHIELD | Source: Ambulatory Visit

## 2017-03-21 DIAGNOSIS — Z3A37 37 weeks gestation of pregnancy: Secondary | ICD-10-CM | POA: Diagnosis not present

## 2017-03-21 DIAGNOSIS — O368330 Maternal care for abnormalities of the fetal heart rate or rhythm, third trimester, not applicable or unspecified: Secondary | ICD-10-CM | POA: Diagnosis not present

## 2017-03-24 ENCOUNTER — Other Ambulatory Visit (HOSPITAL_COMMUNITY): Payer: Self-pay | Admitting: Obstetrics and Gynecology

## 2017-03-25 ENCOUNTER — Inpatient Hospital Stay (HOSPITAL_COMMUNITY): Payer: BLUE CROSS/BLUE SHIELD | Admitting: Anesthesiology

## 2017-03-25 ENCOUNTER — Encounter (HOSPITAL_COMMUNITY): Payer: Self-pay

## 2017-03-25 ENCOUNTER — Inpatient Hospital Stay (HOSPITAL_COMMUNITY)
Admission: RE | Admit: 2017-03-25 | Discharge: 2017-03-27 | DRG: 775 | Disposition: A | Discharge status: Home / Self Care | Payer: BLUE CROSS/BLUE SHIELD | Source: Ambulatory Visit | Attending: Obstetrics and Gynecology | Admitting: Obstetrics and Gynecology

## 2017-03-25 DIAGNOSIS — Z8249 Family history of ischemic heart disease and other diseases of the circulatory system: Secondary | ICD-10-CM | POA: Diagnosis not present

## 2017-03-25 DIAGNOSIS — Z3A37 37 weeks gestation of pregnancy: Secondary | ICD-10-CM

## 2017-03-25 DIAGNOSIS — Z87891 Personal history of nicotine dependence: Secondary | ICD-10-CM | POA: Diagnosis not present

## 2017-03-25 DIAGNOSIS — O99824 Streptococcus B carrier state complicating childbirth: Secondary | ICD-10-CM | POA: Diagnosis not present

## 2017-03-25 DIAGNOSIS — K831 Obstruction of bile duct: Secondary | ICD-10-CM | POA: Diagnosis not present

## 2017-03-25 DIAGNOSIS — Z412 Encounter for routine and ritual male circumcision: Secondary | ICD-10-CM | POA: Diagnosis not present

## 2017-03-25 DIAGNOSIS — O2662 Liver and biliary tract disorders in childbirth: Principal | ICD-10-CM | POA: Diagnosis present

## 2017-03-25 DIAGNOSIS — Z833 Family history of diabetes mellitus: Secondary | ICD-10-CM | POA: Diagnosis not present

## 2017-03-25 DIAGNOSIS — Z23 Encounter for immunization: Secondary | ICD-10-CM | POA: Diagnosis not present

## 2017-03-25 LAB — TYPE AND SCREEN
ABO/RH(D): B POS
Antibody Screen: NEGATIVE

## 2017-03-25 LAB — CBC
HCT: 35.7 % — ABNORMAL LOW (ref 36.0–46.0)
Hemoglobin: 12 g/dL (ref 12.0–15.0)
MCH: 31 pg (ref 26.0–34.0)
MCHC: 33.6 g/dL (ref 30.0–36.0)
MCV: 92.2 fL (ref 78.0–100.0)
Platelets: 180 10*3/uL (ref 150–400)
RBC: 3.87 MIL/uL (ref 3.87–5.11)
RDW: 13.9 % (ref 11.5–15.5)
WBC: 9.4 10*3/uL (ref 4.0–10.5)

## 2017-03-25 LAB — RPR: RPR Ser Ql: NONREACTIVE

## 2017-03-25 LAB — ABO/RH: ABO/RH(D): B POS

## 2017-03-25 MED ORDER — ERYTHROMYCIN 5 MG/GM OP OINT
TOPICAL_OINTMENT | OPHTHALMIC | Status: AC
  Filled 2017-03-25: qty 1

## 2017-03-25 MED ORDER — SENNOSIDES-DOCUSATE SODIUM 8.6-50 MG PO TABS
2.0000 | ORAL_TABLET | ORAL | Status: DC
  Administered 2017-03-25 – 2017-03-27 (×2): 2 via ORAL
  Filled 2017-03-25 (×2): qty 2

## 2017-03-25 MED ORDER — TETANUS-DIPHTH-ACELL PERTUSSIS 5-2.5-18.5 LF-MCG/0.5 IM SUSP: 0.5000 mL | Freq: Once | INTRAMUSCULAR | Status: DC

## 2017-03-25 MED ORDER — OXYCODONE-ACETAMINOPHEN 5-325 MG PO TABS: 1.0000 | ORAL_TABLET | ORAL | Status: DC | PRN

## 2017-03-25 MED ORDER — PHENYLEPHRINE 40 MCG/ML (10ML) SYRINGE FOR IV PUSH (FOR BLOOD PRESSURE SUPPORT)
80.0000 ug | PREFILLED_SYRINGE | INTRAVENOUS | Status: DC | PRN
  Filled 2017-03-25: qty 10

## 2017-03-25 MED ORDER — TERBUTALINE SULFATE 1 MG/ML IJ SOLN: 0.2500 mg | Freq: Once | INTRAMUSCULAR | Status: DC | PRN

## 2017-03-25 MED ORDER — COCONUT OIL OIL
1.0000 "application " | TOPICAL_OIL | Status: DC | PRN
  Administered 2017-03-26: 1 via TOPICAL
  Filled 2017-03-25: qty 120

## 2017-03-25 MED ORDER — SODIUM CHLORIDE 0.9 % IV SOLN
2.0000 g | Freq: Four times a day (QID) | INTRAVENOUS | Status: DC
  Administered 2017-03-25 (×2): 2 g via INTRAVENOUS
  Filled 2017-03-25 (×3): qty 2000

## 2017-03-25 MED ORDER — ONDANSETRON HCL 4 MG/2ML IJ SOLN: 4.0000 mg | INTRAMUSCULAR | Status: DC | PRN

## 2017-03-25 MED ORDER — PHENYLEPHRINE 40 MCG/ML (10ML) SYRINGE FOR IV PUSH (FOR BLOOD PRESSURE SUPPORT): 80.0000 ug | PREFILLED_SYRINGE | INTRAVENOUS | Status: DC | PRN

## 2017-03-25 MED ORDER — SOD CITRATE-CITRIC ACID 500-334 MG/5ML PO SOLN: 30.0000 mL | ORAL | Status: DC | PRN

## 2017-03-25 MED ORDER — BUTORPHANOL TARTRATE 1 MG/ML IJ SOLN: 1.0000 mg | INTRAMUSCULAR | Status: DC | PRN

## 2017-03-25 MED ORDER — OXYCODONE-ACETAMINOPHEN 5-325 MG PO TABS: 2.0000 | ORAL_TABLET | ORAL | Status: DC | PRN

## 2017-03-25 MED ORDER — MEASLES, MUMPS & RUBELLA VAC ~~LOC~~ INJ: 0.5000 mL | INJECTION | Freq: Once | SUBCUTANEOUS | Status: DC

## 2017-03-25 MED ORDER — ACETAMINOPHEN 325 MG PO TABS: 650.0000 mg | ORAL_TABLET | ORAL | Status: DC | PRN

## 2017-03-25 MED ORDER — ZOLPIDEM TARTRATE 5 MG PO TABS: 5.0000 mg | ORAL_TABLET | Freq: Every evening | ORAL | Status: DC | PRN

## 2017-03-25 MED ORDER — EPHEDRINE 5 MG/ML INJ: 10.0000 mg | INTRAVENOUS | Status: DC | PRN

## 2017-03-25 MED ORDER — BENZOCAINE-MENTHOL 20-0.5 % EX AERO: 1.0000 "application " | INHALATION_SPRAY | CUTANEOUS | Status: DC | PRN

## 2017-03-25 MED ORDER — ONDANSETRON HCL 4 MG/2ML IJ SOLN
4.0000 mg | Freq: Four times a day (QID) | INTRAMUSCULAR | Status: DC | PRN
  Administered 2017-03-25: 4 mg via INTRAVENOUS
  Filled 2017-03-25: qty 2

## 2017-03-25 MED ORDER — ONDANSETRON HCL 4 MG PO TABS: 4.0000 mg | ORAL_TABLET | ORAL | Status: DC | PRN

## 2017-03-25 MED ORDER — PRENATAL MULTIVITAMIN CH
1.0000 | ORAL_TABLET | Freq: Every day | ORAL | Status: DC
  Administered 2017-03-26 – 2017-03-27 (×2): 1 via ORAL
  Filled 2017-03-25 (×2): qty 1

## 2017-03-25 MED ORDER — FENTANYL 2.5 MCG/ML BUPIVACAINE 1/10 % EPIDURAL INFUSION (WH - ANES)
14.0000 mL/h | INTRAMUSCULAR | Status: DC | PRN
  Administered 2017-03-25 (×2): 14 mL/h via EPIDURAL
  Filled 2017-03-25 (×2): qty 100

## 2017-03-25 MED ORDER — WITCH HAZEL-GLYCERIN EX PADS: 1.0000 "application " | MEDICATED_PAD | CUTANEOUS | Status: DC | PRN

## 2017-03-25 MED ORDER — OXYTOCIN 40 UNITS IN LACTATED RINGERS INFUSION - SIMPLE MED
2.5000 [IU]/h | INTRAVENOUS | Status: DC
  Administered 2017-03-25: 2.5 [IU]/h via INTRAVENOUS

## 2017-03-25 MED ORDER — MEDROXYPROGESTERONE ACETATE 150 MG/ML IM SUSP: 150.0000 mg | INTRAMUSCULAR | Status: DC | PRN

## 2017-03-25 MED ORDER — LIDOCAINE HCL (PF) 1 % IJ SOLN
30.0000 mL | INTRAMUSCULAR | Status: DC | PRN
  Filled 2017-03-25: qty 30

## 2017-03-25 MED ORDER — OXYTOCIN 40 UNITS IN LACTATED RINGERS INFUSION - SIMPLE MED
1.0000 m[IU]/min | INTRAVENOUS | Status: DC
  Administered 2017-03-25: 2 m[IU]/min via INTRAVENOUS
  Filled 2017-03-25: qty 1000

## 2017-03-25 MED ORDER — DIBUCAINE 1 % RE OINT: 1.0000 "application " | TOPICAL_OINTMENT | RECTAL | Status: DC | PRN

## 2017-03-25 MED ORDER — SIMETHICONE 80 MG PO CHEW: 80.0000 mg | CHEWABLE_TABLET | ORAL | Status: DC | PRN

## 2017-03-25 MED ORDER — LIDOCAINE HCL (PF) 1 % IJ SOLN
INTRAMUSCULAR | Status: DC | PRN
  Administered 2017-03-25 (×2): 4 mL via EPIDURAL

## 2017-03-25 MED ORDER — DIPHENHYDRAMINE HCL 25 MG PO CAPS: 25.0000 mg | ORAL_CAPSULE | Freq: Four times a day (QID) | ORAL | Status: DC | PRN

## 2017-03-25 MED ORDER — LACTATED RINGERS IV SOLN: 500.0000 mL | INTRAVENOUS | Status: DC | PRN

## 2017-03-25 MED ORDER — LACTATED RINGERS IV SOLN
INTRAVENOUS | Status: DC
  Administered 2017-03-25 (×3): via INTRAVENOUS

## 2017-03-25 MED ORDER — IBUPROFEN 600 MG PO TABS
600.0000 mg | ORAL_TABLET | Freq: Four times a day (QID) | ORAL | Status: DC
  Administered 2017-03-25 – 2017-03-27 (×7): 600 mg via ORAL
  Filled 2017-03-25 (×7): qty 1

## 2017-03-25 MED ORDER — MISOPROSTOL 25 MCG QUARTER TABLET
25.0000 ug | ORAL_TABLET | ORAL | Status: DC | PRN
  Administered 2017-03-25 (×2): 25 ug via VAGINAL
  Filled 2017-03-25 (×2): qty 1

## 2017-03-25 MED ORDER — LACTATED RINGERS IV SOLN: 500.0000 mL | Freq: Once | INTRAVENOUS | Status: DC

## 2017-03-25 MED ORDER — DIPHENHYDRAMINE HCL 50 MG/ML IJ SOLN: 12.5000 mg | INTRAMUSCULAR | Status: DC | PRN

## 2017-03-25 MED ORDER — OXYTOCIN BOLUS FROM INFUSION
500.0000 mL | Freq: Once | INTRAVENOUS | Status: AC
  Administered 2017-03-25: 500 mL via INTRAVENOUS

## 2017-03-25 NOTE — H&P (Signed)
Shemia Bevel is a 41 y.o. female presenting for IOL due to Cholestasis of pregnancy, diagnosed at 28 weeks.  Has had 2x/weekly NSTs and BPP weekly.  MFM recommends delivery at 37 weeks.  AMA with normal NIPT.  GBS+. OB History    Gravida Para Term Preterm AB Living   2 1 1     1    SAB TAB Ectopic Multiple Live Births           1     Past Medical History:  Diagnosis Date  . Blood transfusion without reported diagnosis    leaukemia 18 years ago  . Cholestasis during pregnancy   . Endometriosis   . Fibroid   . Leukemia [facility])    Past Surgical History:  Procedure Laterality Date  . NO PAST SURGERIES     Family History: family history includes Cancer in her mother; Diabetes in her maternal aunt, maternal grandmother, maternal uncle, and paternal aunt; Hypertension in her father; Leukemia in her paternal grandfather; Mental retardation in her maternal uncle; Stroke in her paternal grandmother. Social History:  reports that she has quit smoking. She has never used smokeless tobacco. She reports that she does not drink alcohol or use drugs.     Maternal Diabetes: No Genetic Screening: Normal Maternal Ultrasounds/Referrals: Normal Fetal Ultrasounds or other Referrals:  Referred to Materal Fetal Medicine  Maternal Substance Abuse:  No Significant Maternal Medications:  Meds include: Other: urosdiol Significant Maternal Lab Results:  None Other Comments:  None  ROS History Dilation: 1.5 Effacement (%): Thick Station: -3 Exam by:: Tia Gelb Blood pressure 137/90, pulse 89, temperature 97.6 F (36.4 C), temperature source Oral, resp. rate 20, height 5\' 5"  (1.651 m), weight 178 lb (80.7 kg), last menstrual period 07/05/2016. Exam Physical Exam  Prenatal labs: ABO, Rh: --/--/B POS, B POS (05/01 0110) Antibody: NEG (05/01 0110) Rubella: Immune (10/11 0000) RPR: Nonreactive (10/11 0000)  HBsAg: Negative (10/11 0000)  HIV: Non-reactive (10/11 0000)  GBS: Positive (04/19 0000)    Assessment/Plan: IUP at term.Cholestasis GBS +  IV abx AROM, Pitocin, anticipate SVD   Denai Caba C 03/25/2017, 9:39 AM

## 2017-03-25 NOTE — Anesthesia Preprocedure Evaluation (Signed)
Anesthesia Evaluation  Patient identified by MRN, date of birth, ID band Patient awake    Reviewed: Allergy & Precautions, Patient's Chart, lab work & pertinent test results  Airway Mallampati: II  TM Distance: >3 FB Neck ROM: Full    Dental no notable dental hx. (+) Teeth Intact   Pulmonary former smoker,    Pulmonary exam normal breath sounds clear to auscultation       Cardiovascular negative cardio ROS Normal cardiovascular exam Rhythm:Regular Rate:Normal     Neuro/Psych negative neurological ROS  negative psych ROS   GI/Hepatic GERD  ,Cholestasis of pregnancy   Endo/Other    Renal/GU   negative genitourinary   Musculoskeletal negative musculoskeletal ROS (+)   Abdominal (+) - obese,   Peds  Hematology Hx/o Leukemia   Anesthesia Other Findings   Reproductive/Obstetrics (+) Pregnancy AMA                             Anesthesia Physical Anesthesia Plan  ASA: II  Anesthesia Plan: Epidural   Post-op Pain Management:    Induction:   Airway Management Planned: Natural Airway  Additional Equipment:   Intra-op Plan:   Post-operative Plan:   Informed Consent: I have reviewed the patients History and Physical, chart, labs and discussed the procedure including the risks, benefits and alternatives for the proposed anesthesia with the patient or authorized representative who has indicated his/her understanding and acceptance.     Plan Discussed with: Anesthesiologist  Anesthesia Plan Comments:         Anesthesia Quick Evaluation

## 2017-03-25 NOTE — Anesthesia Procedure Notes (Signed)
Epidural Patient location during procedure: OB Start time: 03/25/2017 11:01 AM  Staffing Anesthesiologist: Josephine Igo Performed: anesthesiologist   Preanesthetic Checklist Completed: patient identified, site marked, surgical consent, pre-op evaluation, timeout performed, IV checked, risks and benefits discussed and monitors and equipment checked  Epidural Patient position: sitting Prep: site prepped and draped and DuraPrep Patient monitoring: continuous pulse ox and blood pressure Approach: midline Location: L3-L4 Injection technique: LOR air  Needle:  Needle type: Tuohy  Needle gauge: 17 G Needle length: 9 cm and 9 Needle insertion depth: 5 cm cm Catheter type: closed end flexible Catheter size: 19 Gauge Catheter at skin depth: 10 cm Test dose: negative and Other  Assessment Events: blood not aspirated, injection not painful, no injection resistance, negative IV test and no paresthesia  Additional Notes Patient identified. Risks and benefits discussed including failed block, incomplete  Pain control, post dural puncture headache, nerve damage, paralysis, blood pressure Changes, nausea, vomiting, reactions to medications-both toxic and allergic and post Partum back pain. All questions were answered. Patient expressed understanding and wished to proceed. Sterile technique was used throughout procedure. Epidural site was Dressed with sterile barrier dressing. No paresthesias, signs of intravascular injection Or signs of intrathecal spread were encountered.  Patient was more comfortable after the epidural was dosed. Please see RN's note for documentation of vital signs and FHR which are stable.

## 2017-03-25 NOTE — Anesthesia Pain Management Evaluation Note (Signed)
  CRNA Pain Management Visit Note  Patient: Joann Meadows, 41 y.o., female  "Hello I am a member of the anesthesia team at [facility]. We have an anesthesia team available at all times to provide care throughout the hospital, including epidural management and anesthesia for C-section. I don't know your plan for the delivery whether it a natural birth, water birth, IV sedation, nitrous supplementation, doula or epidural, but we want to meet your pain goals."   1.Was your pain managed to your expectations on prior hospitalizations?   Yes   2.What is your expectation for pain management during this hospitalization?     Epidural  3.How can we help you reach that goal?   Record the patient's initial score and the patient's pain goal.   Pain: 4  Pain Goal: 8 [facility] wants you to be able to say your pain was always managed very well.  Jabier Mutton 03/25/2017

## 2017-03-26 LAB — CBC
HCT: 35.8 % — ABNORMAL LOW (ref 36.0–46.0)
Hemoglobin: 11.9 g/dL — ABNORMAL LOW (ref 12.0–15.0)
MCH: 31 pg (ref 26.0–34.0)
MCHC: 33.2 g/dL (ref 30.0–36.0)
MCV: 93.2 fL (ref 78.0–100.0)
Platelets: 186 10*3/uL (ref 150–400)
RBC: 3.84 MIL/uL — ABNORMAL LOW (ref 3.87–5.11)
RDW: 14.1 % (ref 11.5–15.5)
WBC: 15.3 10*3/uL — ABNORMAL HIGH (ref 4.0–10.5)

## 2017-03-26 NOTE — Lactation Note (Signed)
This note was copied from a baby's chart. Lactation Consultation Note  Mother reports that she is having difficulty positioning the baby.  Attempted to help her with positioning but baby was not hungry and fell asleep. Encouraged her to call for assistance at the next feeding to work on positioning. Reviewed early term behavior with her and encouraged hand expression and spoon feeding after each BF.  Patient Name: Joann Meadows [id number] Date: 03/26/2017 Reason for consult: Initial assessment   Maternal Data    Feeding Feeding Type: Breast Fed Length of feed: 15 min  LATCH Score/Interventions Latch: Too sleepy or reluctant, no latch achieved, no sucking elicited.  Audible Swallowing: None  Type of Nipple: Everted at rest and after stimulation  Comfort (Breast/Nipple): Soft / non-tender     Hold (Positioning): Assistance needed to correctly position infant at breast and maintain latch.  LATCH Score: 5  Lactation Tools Discussed/Used     Consult Status Consult Status: Follow-up Date: 03/27/17 Follow-up type: In-patient    [name] 03/26/2017, 2:00 PM

## 2017-03-26 NOTE — Anesthesia Postprocedure Evaluation (Signed)
Anesthesia Post Note  Patient: Joann Meadows  Procedure(s) Performed: * No procedures listed *  Patient location during evaluation: Mother Baby Anesthesia Type: Epidural Level of consciousness: awake Pain management: pain level controlled Vital Signs Assessment: post-procedure vital signs reviewed and stable Respiratory status: spontaneous breathing Cardiovascular status: stable Postop Assessment: no headache, no backache, epidural receding and patient able to bend at knees        Last Vitals:  Vitals:   03/25/17 2215 03/26/17 0235  BP: 130/71 110/68  Pulse: (!) 103 91  Resp: 18 18  Temp: 37.3 C 36.9 C    Last Pain:  Vitals:   03/26/17 0640  TempSrc:   PainSc: 3    Pain Goal:                 Everette Rank

## 2017-03-26 NOTE — Lactation Note (Signed)
This note was copied from a baby's chart. Lactation Consultation Note  Patient Name: Joann Meadows [id number] Date: 03/26/2017 Reason for consult: Follow-up assessment  Baby 13 hours old and 2nd [place] visit for today , mom called for a feeding assessment with feeding cues. Diaper checked and dry .  LC assessed breast tissue and noted the areolas to be  semi compressible, LC had mom massage breast tissue , hand express, pre - pump if needed to make the nipple areola complex more elastic and then reverse pressure . Areolas on right and left breast more compressible. After above steps. ( didn't need to pre - pump).  Baby fed the left 10 mins , and the the right 10 mins, swallows noted and sustained latch, initially on the left some discomfort and improved.  LC instructed mom on the use hand pump and shells.     Maternal Data Has patient been taught Hand Expression?: Yes Does the patient have breastfeeding experience prior to this delivery?: Yes  Feeding Feeding Type: Breast Fed (football / right breast ) Length of feed: 10 min  LATCH Score/Interventions Latch: Grasps breast easily, tongue down, lips flanged, rhythmical sucking. (massage, hand express, reverse pressure ) Intervention(s): Skin to skin;Teach feeding cues;Waking techniques Intervention(s): Adjust position;Assist with latch;Breast massage;Breast compression  Audible Swallowing: A few with stimulation Intervention(s): Skin to skin;Hand expression;Alternate breast massage  Type of Nipple: Everted at rest and after stimulation (more areola edema compared to the left , ) Intervention(s): Shells;Hand pump;Reverse pressure  Comfort (Breast/Nipple): Soft / non-tender  Problem noted: Mild/Moderate discomfort (swelling at nipples.) Interventions (Mild/moderate discomfort): Hand massage;Reverse pressue;Pre-pump if needed;Breast shields  Hold (Positioning): Assistance needed to correctly position infant at breast and maintain  latch. Intervention(s): Breastfeeding basics reviewed;Support Pillows;Position options;Skin to skin  LATCH Score: 8  Lactation Tools Discussed/Used Tools: Shells;Pump Shell Type: Inverted Breast pump type: Manual Pump Review: Setup, frequency, and cleaning   Consult Status Consult Status: Follow-up Date: 03/27/17 Follow-up type: In-patient    [place] 03/26/2017, 2:37 PM

## 2017-03-26 NOTE — Progress Notes (Signed)
Post Partum Day 1 Subjective: no complaints, up ad lib, voiding, tolerating PO and + flatus  Objective: Blood pressure 110/68, pulse 91, temperature 98.5 F (36.9 C), temperature source Oral, resp. rate 18, height 5\' 5"  (1.651 m), weight 178 lb (80.7 kg), last menstrual period 07/05/2016, SpO2 98 %, unknown if currently breastfeeding.  Physical Exam:  General: alert and cooperative Lochia: appropriate Uterine Fundus: firm Incision: na DVT Evaluation: No evidence of DVT seen on physical exam. Negative Homan's sign. No cords or calf tenderness. No significant calf/ankle edema.   Recent Labs  03/25/17 0110 03/26/17 0507  HGB 12.0 11.9*  HCT 35.7* 35.8*    Assessment/Plan: Plan for discharge tomorrow and Circumcision prior to discharge   LOS: 1 day   Sye Schroepfer G 03/26/2017, 7:59 AM

## 2017-03-26 NOTE — Progress Notes (Signed)
Post Partum Day 1 Subjective: no complaints, up ad lib, voiding, tolerating PO and + flatus  Objective: Blood pressure 110/68, pulse 91, temperature 98.5 F (36.9 C), temperature source Oral, resp. rate 18, height 5\' 5"  (1.651 m), weight 80.7 kg (178 lb), last menstrual period 07/05/2016, SpO2 98 %, unknown if currently breastfeeding.  Physical Exam:  General: alert, cooperative and appears stated age Lochia: appropriate Uterine Fundus: firm Incision: N/A DVT Evaluation: No evidence of DVT seen on physical exam. Negative Homan's sign. No cords or calf tenderness. No significant calf/ankle edema.   Recent Labs  03/25/17 0110 03/26/17 0507  HGB 12.0 11.9*  HCT 35.7* 35.8*    Assessment/Plan: Plan for discharge tomorrow and Circumcision prior to discharge - requests Dr. Corinna Capra to do.   LOS: 1 day   Tyson Dense 03/26/2017, 8:04 AM

## 2017-03-26 NOTE — Plan of Care (Signed)
Problem: Education: Goal: Knowledge of condition will improve Patient comfortable with care and is just waiting for baby to have circumcision. Consent signed.

## 2017-03-27 ENCOUNTER — Ambulatory Visit: Payer: Self-pay

## 2017-03-27 MED ORDER — IBUPROFEN 600 MG PO TABS: 600.0000 mg | ORAL_TABLET | Freq: Four times a day (QID) | ORAL | 1 refills | Status: DC

## 2017-03-27 NOTE — Lactation Note (Signed)
This note was copied from a baby's chart. Lactation Consultation Note  Baby 73 hours old and mother requested a nipple shield. Mother states her R nipple burns when she breastfeeds.  No shooting pain into breast.  Nipple is pink. Latched baby with #20NS per mother's request but mother states it does not help with burning. Relatched baby without NS.  Sucks and swallows observed. Provided mother w/ comfort gels and suggest she alternate with gels and shells w/ coconut oil and ebm. Discussed S&S of yeast infection for future reference. Reviewed cluster feeding.  Patient Name: Joann Meadows [id number] Date: 03/27/2017 Reason for consult: Follow-up assessment   Maternal Data    Feeding Feeding Type: Breast Fed Length of feed: 25 min  LATCH Score/Interventions Latch: Grasps breast easily, tongue down, lips flanged, rhythmical sucking. Intervention(s): Teach feeding cues Intervention(s): Assist with latch  Audible Swallowing: A few with stimulation Intervention(s): Skin to skin;Alternate breast massage  Type of Nipple: Everted at rest and after stimulation  Comfort (Breast/Nipple): Filling, red/small blisters or bruises, mild/mod discomfort  Problem noted: Mild/Moderate discomfort Interventions (Mild/moderate discomfort): Hand expression;Comfort gels  Hold (Positioning): No assistance needed to correctly position infant at breast. Intervention(s): Breastfeeding basics reviewed;Support Pillows;Skin to skin  LATCH Score: 8  Lactation Tools Discussed/Used Tools: Nipple Shields Nipple shield size: 20 Shell Type: Inverted   Consult Status Consult Status: Follow-up Date: 03/28/17 Follow-up type: In-patient    Joann Meadows [facility] 03/27/2017, 6:11 PM

## 2017-03-27 NOTE — Discharge Summary (Signed)
Obstetric Discharge Summary Reason for Admission: induction of labor Prenatal Procedures: ultrasound Intrapartum Procedures: spontaneous vaginal delivery Postpartum Procedures: none Complications-Operative and Postpartum: none Hemoglobin  Date Value Ref Range Status  03/26/2017 11.9 (L) 12.0 - 15.0 g/dL Final   HCT  Date Value Ref Range Status  03/26/2017 35.8 (L) 36.0 - 46.0 % Final    Physical Exam:  General: alert and cooperative Lochia: appropriate Uterine Fundus: firm Incision: perineum intact DVT Evaluation: No evidence of DVT seen on physical exam. Negative Homan's sign. No cords or calf tenderness. No significant calf/ankle edema.  Discharge Diagnoses: Term Pregnancy-delivered  Discharge Information: Date: 03/27/2017 Activity: pelvic rest Diet: routine Medications: PNV and Ibuprofen Condition: stable Instructions: refer to practice specific booklet Discharge to: home   Newborn Data: Live born female  Birth Weight: 6 lb 15.3 oz (3155 g) APGAR: 8, 9  Home with mother.  Joann Meadows G 03/27/2017, 8:16 AM

## 2017-03-27 NOTE — Lactation Note (Signed)
This note was copied from a baby's chart. Lactation Consultation Note In 110 hrs old baby had 7% weight loss. Had 7 voids and 5 stools. Baby cluster feeding at this time. Mom states feeding going well, just feeding a lot. Reviewed cluster feeding and newborn feeding habits. Stressed importance of I&O. Patient Name: Joann Meadows Date: 03/27/2017 Reason for consult: Follow-up assessment;Infant weight loss   Maternal Data    Feeding Feeding Type: Breast Fed Length of feed: 15 min  LATCH Score/Interventions                      Lactation Tools Discussed/Used     Consult Status Consult Status: Follow-up Date: 03/27/17 Follow-up type: In-patient    Vamsi Apfel, Elta [place] 03/27/2017, 2:20 AM

## 2017-03-27 NOTE — Plan of Care (Signed)
Problem: Education: Goal: Knowledge of condition will improve Outcome: Completed/Met Date Met: 03/27/17 Discharge education reviewed with patient. Patient verbalizes understanding of information.

## 2017-03-27 NOTE — Lactation Note (Signed)
This note was copied from a baby's chart. Lactation Consultation Note  Patient Name: Joann Meadows [id number] Date: 03/27/2017  Mom states baby cluster fed all night.  She is asking if baby needs formula.  Baby is currently in nursery for circumcision.   Discussed with mom that baby will probably be sleepy after circ and it will be a good time for her to get some rest. Encouraged to call out for assist/concerns.   Maternal Data    Feeding    LATCH Score/Interventions                      Lactation Tools Discussed/Used     Consult Status      Ave Filter 03/27/2017, 9:27 AM

## 2017-03-28 ENCOUNTER — Ambulatory Visit: Payer: Self-pay

## 2017-03-28 NOTE — Lactation Note (Signed)
This note was copied from a baby's chart. Lactation Consultation Note  Patient Name: Joann Meadows [id number] Date: 03/28/2017  Mom concerned about sore nipples and breast fullness.  Nipples red/intact.  Breasts full but not engorged.  Mom states baby softens breast but she fills up quickly.  Discussed milk coming to volume and reassured this is normal.  Mom has comfort gels.  Recommended ice packs for breast discomfort.  Mom would like to pump with DEBP.  Pump set up and initiated.  Mom comfortable with pumping.  She obtained 19 mls in a 10 minute pumping.  Parents would like to rent a pump.  Paperwork given.  Outpatient lactation services and support reviewed and encouraged.   Maternal Data    Feeding Feeding Type: Breast Fed Length of feed: 15 min  LATCH Score/Interventions                      Lactation Tools Discussed/Used     Consult Status      [place] Cantero S 03/28/2017, 10:30 AM

## 2017-04-13 ENCOUNTER — Telehealth (HOSPITAL_COMMUNITY): Payer: Self-pay | Admitting: Lactation Services

## 2017-04-13 NOTE — Telephone Encounter (Signed)
Joann Meadows called to say that her DEBP is not working properly, there is an error message when she attempts to use. Mom states that she or her sister will bring the pump in with the rental paperwork to switch with an operating pump. Given this LC's phone extension to call for assistance before 2300 tonight--04/13/17.

## 2017-04-25 DIAGNOSIS — E034 Atrophy of thyroid (acquired): Secondary | ICD-10-CM | POA: Diagnosis not present

## 2017-04-30 DIAGNOSIS — Z1389 Encounter for screening for other disorder: Secondary | ICD-10-CM | POA: Diagnosis not present

## 2017-08-04 DIAGNOSIS — J069 Acute upper respiratory infection, unspecified: Secondary | ICD-10-CM | POA: Diagnosis not present

## 2017-08-04 DIAGNOSIS — J029 Acute pharyngitis, unspecified: Secondary | ICD-10-CM | POA: Diagnosis not present

## 2017-09-03 DIAGNOSIS — R946 Abnormal results of thyroid function studies: Secondary | ICD-10-CM | POA: Diagnosis not present

## 2017-09-03 DIAGNOSIS — E7800 Pure hypercholesterolemia, unspecified: Secondary | ICD-10-CM | POA: Diagnosis not present

## 2017-09-03 DIAGNOSIS — D229 Melanocytic nevi, unspecified: Secondary | ICD-10-CM | POA: Diagnosis not present

## 2017-09-03 DIAGNOSIS — C9291 Myeloid leukemia, unspecified in remission: Secondary | ICD-10-CM | POA: Diagnosis not present

## 2017-09-18 DIAGNOSIS — Z1283 Encounter for screening for malignant neoplasm of skin: Secondary | ICD-10-CM | POA: Diagnosis not present

## 2017-09-18 DIAGNOSIS — D225 Melanocytic nevi of trunk: Secondary | ICD-10-CM | POA: Diagnosis not present

## 2017-09-18 DIAGNOSIS — L821 Other seborrheic keratosis: Secondary | ICD-10-CM | POA: Diagnosis not present

## 2017-12-26 DIAGNOSIS — H01024 Squamous blepharitis left upper eyelid: Secondary | ICD-10-CM | POA: Diagnosis not present

## 2017-12-26 DIAGNOSIS — D3132 Benign neoplasm of left choroid: Secondary | ICD-10-CM | POA: Diagnosis not present

## 2017-12-26 DIAGNOSIS — H01022 Squamous blepharitis right lower eyelid: Secondary | ICD-10-CM | POA: Diagnosis not present

## 2017-12-26 DIAGNOSIS — H01021 Squamous blepharitis right upper eyelid: Secondary | ICD-10-CM | POA: Diagnosis not present

## 2018-04-09 DIAGNOSIS — H01024 Squamous blepharitis left upper eyelid: Secondary | ICD-10-CM | POA: Diagnosis not present

## 2018-04-09 DIAGNOSIS — H01021 Squamous blepharitis right upper eyelid: Secondary | ICD-10-CM | POA: Diagnosis not present

## 2018-04-09 DIAGNOSIS — D3132 Benign neoplasm of left choroid: Secondary | ICD-10-CM | POA: Diagnosis not present

## 2018-04-09 DIAGNOSIS — H01022 Squamous blepharitis right lower eyelid: Secondary | ICD-10-CM | POA: Diagnosis not present

## 2018-08-12 DIAGNOSIS — Z800 Family history of malignant neoplasm of digestive organs: Secondary | ICD-10-CM | POA: Diagnosis not present

## 2018-08-12 DIAGNOSIS — R1013 Epigastric pain: Secondary | ICD-10-CM | POA: Diagnosis not present

## 2018-08-14 DIAGNOSIS — J069 Acute upper respiratory infection, unspecified: Secondary | ICD-10-CM | POA: Diagnosis not present

## 2018-08-14 DIAGNOSIS — R109 Unspecified abdominal pain: Secondary | ICD-10-CM | POA: Diagnosis not present

## 2018-09-09 DIAGNOSIS — Z800 Family history of malignant neoplasm of digestive organs: Secondary | ICD-10-CM | POA: Diagnosis not present

## 2018-09-09 DIAGNOSIS — Z1211 Encounter for screening for malignant neoplasm of colon: Secondary | ICD-10-CM | POA: Diagnosis not present

## 2018-09-29 DIAGNOSIS — H01022 Squamous blepharitis right lower eyelid: Secondary | ICD-10-CM | POA: Diagnosis not present

## 2018-09-29 DIAGNOSIS — H01025 Squamous blepharitis left lower eyelid: Secondary | ICD-10-CM | POA: Diagnosis not present

## 2018-09-29 DIAGNOSIS — H01021 Squamous blepharitis right upper eyelid: Secondary | ICD-10-CM | POA: Diagnosis not present

## 2018-09-29 DIAGNOSIS — H01024 Squamous blepharitis left upper eyelid: Secondary | ICD-10-CM | POA: Diagnosis not present

## 2018-10-01 DIAGNOSIS — R197 Diarrhea, unspecified: Secondary | ICD-10-CM | POA: Diagnosis not present

## 2018-11-25 DIAGNOSIS — C50919 Malignant neoplasm of unspecified site of unspecified female breast: Secondary | ICD-10-CM

## 2018-11-25 DIAGNOSIS — Z923 Personal history of irradiation: Secondary | ICD-10-CM

## 2018-11-25 HISTORY — DX: Malignant neoplasm of unspecified site of unspecified female breast: C50.919

## 2018-11-25 HISTORY — DX: Personal history of irradiation: Z92.3

## 2018-12-24 DIAGNOSIS — H0102B Squamous blepharitis left eye, upper and lower eyelids: Secondary | ICD-10-CM | POA: Diagnosis not present

## 2018-12-24 DIAGNOSIS — H0102A Squamous blepharitis right eye, upper and lower eyelids: Secondary | ICD-10-CM | POA: Diagnosis not present

## 2018-12-24 DIAGNOSIS — H1045 Other chronic allergic conjunctivitis: Secondary | ICD-10-CM | POA: Diagnosis not present

## 2018-12-24 DIAGNOSIS — H0014 Chalazion left upper eyelid: Secondary | ICD-10-CM | POA: Diagnosis not present

## 2018-12-30 DIAGNOSIS — Z1231 Encounter for screening mammogram for malignant neoplasm of breast: Secondary | ICD-10-CM | POA: Diagnosis not present

## 2018-12-30 DIAGNOSIS — N760 Acute vaginitis: Secondary | ICD-10-CM | POA: Diagnosis not present

## 2018-12-30 DIAGNOSIS — N951 Menopausal and female climacteric states: Secondary | ICD-10-CM | POA: Diagnosis not present

## 2018-12-30 DIAGNOSIS — Z6827 Body mass index (BMI) 27.0-27.9, adult: Secondary | ICD-10-CM | POA: Diagnosis not present

## 2018-12-30 DIAGNOSIS — Z01419 Encounter for gynecological examination (general) (routine) without abnormal findings: Secondary | ICD-10-CM | POA: Diagnosis not present

## 2019-01-04 ENCOUNTER — Other Ambulatory Visit: Payer: Self-pay | Admitting: Obstetrics and Gynecology

## 2019-01-04 DIAGNOSIS — R928 Other abnormal and inconclusive findings on diagnostic imaging of breast: Secondary | ICD-10-CM

## 2019-01-05 ENCOUNTER — Other Ambulatory Visit: Payer: Self-pay | Admitting: Obstetrics and Gynecology

## 2019-01-05 ENCOUNTER — Ambulatory Visit
Admission: RE | Admit: 2019-01-05 | Discharge: 2019-01-05 | Disposition: A | Discharge status: Home / Self Care | Payer: BLUE CROSS/BLUE SHIELD | Source: Ambulatory Visit | Attending: Obstetrics and Gynecology | Admitting: Obstetrics and Gynecology

## 2019-01-05 DIAGNOSIS — R921 Mammographic calcification found on diagnostic imaging of breast: Secondary | ICD-10-CM

## 2019-01-05 DIAGNOSIS — R928 Other abnormal and inconclusive findings on diagnostic imaging of breast: Secondary | ICD-10-CM

## 2019-01-08 ENCOUNTER — Other Ambulatory Visit: Payer: Self-pay | Admitting: Obstetrics and Gynecology

## 2019-01-08 ENCOUNTER — Ambulatory Visit
Admission: RE | Admit: 2019-01-08 | Discharge: 2019-01-08 | Disposition: A | Discharge status: Home / Self Care | Payer: BLUE CROSS/BLUE SHIELD | Source: Ambulatory Visit | Attending: Obstetrics and Gynecology | Admitting: Obstetrics and Gynecology

## 2019-01-08 DIAGNOSIS — R921 Mammographic calcification found on diagnostic imaging of breast: Secondary | ICD-10-CM

## 2019-01-08 DIAGNOSIS — D0511 Intraductal carcinoma in situ of right breast: Secondary | ICD-10-CM | POA: Diagnosis not present

## 2019-01-11 ENCOUNTER — Other Ambulatory Visit: Payer: Self-pay | Admitting: Obstetrics and Gynecology

## 2019-01-11 DIAGNOSIS — R921 Mammographic calcification found on diagnostic imaging of breast: Secondary | ICD-10-CM

## 2019-01-14 ENCOUNTER — Ambulatory Visit
Admission: RE | Admit: 2019-01-14 | Discharge: 2019-01-14 | Disposition: A | Discharge status: Home / Self Care | Payer: BLUE CROSS/BLUE SHIELD | Source: Ambulatory Visit | Attending: Obstetrics and Gynecology | Admitting: Obstetrics and Gynecology

## 2019-01-14 DIAGNOSIS — R921 Mammographic calcification found on diagnostic imaging of breast: Secondary | ICD-10-CM

## 2019-01-14 DIAGNOSIS — N6012 Diffuse cystic mastopathy of left breast: Secondary | ICD-10-CM | POA: Diagnosis not present

## 2019-01-15 ENCOUNTER — Ambulatory Visit: Payer: Self-pay | Admitting: Surgery

## 2019-01-15 DIAGNOSIS — D0511 Intraductal carcinoma in situ of right breast: Secondary | ICD-10-CM

## 2019-01-15 DIAGNOSIS — N6092 Unspecified benign mammary dysplasia of left breast: Secondary | ICD-10-CM

## 2019-01-15 NOTE — H&P (Signed)
History of Present Illness Joann Meadows. Joann Corwin MD; 01/15/2019 1:09 PM) The patient is a 43 year old female who presents with breast cancer. PCP - [facility] Referred by GYN [name] for DCIS right breast/ ALH left breast  This is a healthy 43 year old [place] female with a history of acute myelogenous leukemia 21 years ago who presents after a recent routine screening mammogram. In the lateral inferior right breast there is a 5 mm area of pleomorphic calcifications. The upper outer left breast showed a loosely grouped 16 mm area of calcifications. The left lateral inferior breast showed a 3 mm area of round punctate calcifications. Initially she underwent core biopsy of the right breast calcifications. This returned a diagnosis of DCIS with lobular neoplasia, ER/PR positive. Subsequently she underwent biopsy of the 2 areas in the left breast. The loosely grouped 16 mm area of calcifications revealed a diagnosis of PASH. The lower Outer quadrant area of calcifications showed atypical lobular hyperplasia. She is here for her initial consultation. She is accompanied by her husband and sister-in-law.  No previous breast complaints AML treated at age 35. No residual problems.  Menarche - 16 First pregnancy - 34 Breast feed - yes Hormones - no Family history - negative for breast cancer; mother died of colon cancer at age 61.    Path - right breast - DCIS with lobular neoplasia, ER/ PR + Left breast - lower outer quadrant 3 mm calcs - atypical lobular hyperplasia Left breast - upper outer quadrant 16 mm calcs - PASH   CLINICAL DATA: The patient was called back from screening mammography due to bilateral breast calcifications.  EXAM: DIGITAL DIAGNOSTIC BILATERAL MAMMOGRAM  COMPARISON: Previous exam(s).  ACR Breast Density Category c: The breast tissue is heterogeneously dense, which may obscure small masses.  FINDINGS: There is a group of calcifications in the lateral inferior  right breast spanning 5 mm. These calcifications are pleomorphic. The remainder of the calcifications are scattered and not group.  There are loosely grouped calcifications in the upper-outer left breast spanning up to 16 mm, round and punctate in character. There is a smaller group of round and punctate calcifications in the lateral inferior left breast, spanning 3 mm. The remainder of the calcifications on the left are scattered.  IMPRESSION: There is a 5 mm group of indeterminate calcifications in the lateral inferior right breast. There are 2 indeterminate groups on the left as described above. The remainder of the bilateral calcifications are scattered and of no concern.  RECOMMENDATION: Recommend biopsying the lateral inferior 5 mm group of calcifications on the right. If this biopsy is benign, recommend six-month follow-up of the left breast calcifications. If the biopsy requires surgery, recommend biopsying the 2 groups of indeterminate calcifications on the left.  I have discussed the findings and recommendations with the patient. Results were also provided in writing at the conclusion of the visit. If applicable, a reminder letter will be sent to the patient regarding the next appointment.  BI-RADS CATEGORY 4: Suspicious.   Electronically Signed By: [name] On: 01/05/2019 15:30  CLINICAL DATA: 43 year old with screening detected 5 mm group of indeterminate calcifications involving the LOWER OUTER QUADRANT of the RIGHT breast at MIDDLE depth. Patient states she had multiple episodes of RIGHT breast mastitis while breast feeding (which she discontinued approximately 6 months ago). Personal history of leukemia 20+ years ago.  EXAM: RIGHT BREAST STEREOTACTIC CORE NEEDLE BIOPSY  COMPARISON: Previous exams.  FINDINGS: The patient and I  discussed the procedure of stereotactic-guided biopsy including benefits and alternatives. We discussed the  high likelihood of a successful procedure. We discussed the risks of the procedure including infection, bleeding, tissue injury, clip migration, and inadequate sampling. Informed written consent was given. The usual time out protocol was performed immediately prior to the procedure.  Using sterile technique with chlorhexidine as skin antisepsis, 1% lidocaine and 1% lidocaine with epinephrine as local anesthetic, under stereotactic guidance, a 9 gauge Brevera vacuum assisted device was used to perform core needle biopsy of calcifications involving the LOWER OUTER QUADRANT of the RIGHT breast at MIDDLE depth using a LATERAL approach. Specimen radiograph was performed showing calcifications in at least 2 of the core samples. Specimens with calcifications are identified for pathology.  Lesion quadrant: LOWER OUTER QUADRANT.  At the conclusion of the procedure, a coil shaped tissue marker clip was deployed into the biopsy cavity. Follow-up 2-view mammogram was performed and dictated separately.  IMPRESSION: Stereotactic-guided biopsy of indeterminate calcifications involving the LOWER OUTER QUADRANT of the RIGHT breast at MIDDLE depth. No apparent complications.  Electronically Signed: By: [name] On: 01/08/2019 13:50  ADDENDUM: Pathology revealed DUCTAL CARCINOMA IN SITU, LOBULAR NEOPLASIA with CALCIFICATIONS of the RIGHT breast, lower outer quadrant at middle depth. This was found to be concordant by Dr. [name].  Pathology results were discussed with the patient by telephone. The patient reported doing well after the biopsy with tenderness at the site. Post biopsy instructions and care were reviewed and questions were answered. The patient was encouraged to call [facility] for any additional concerns.  Surgical consultation has been arranged with Dr. [name] at [facility] on 01-15-2019. Patient is scheduled for  biopsies at [facility] for LEFT breast CALCIFICATIONS on 01-14-19 at 1130.  Pathology results reported by [name], RN on 01/12/2019.   Electronically Signed By: [name] On: 01/12/2019 08:17  CLINICAL DATA: Confirmation of clip placement after stereotactic tomosynthesis guided core needle biopsy of calcifications involving the LOWER OUTER QUADRANT of the RIGHT breast at MIDDLE depth.  EXAM: DIAGNOSTIC RIGHT MAMMOGRAM POST STEREOTACTIC BIOPSY  COMPARISON: Previous exam(s).  FINDINGS: Mammographic images were obtained following stereotactic tomosynthesis guided biopsy of calcifications involving the LOWER OUTER QUADRANT of the RIGHT breast at MIDDLE depth. The coil shaped tissue marker clip is appropriately position at the site of the biopsied calcifications. There are residual calcifications from the posterior aspect of the biopsied group, and the clip is approximately 5 mm MEDIAL to the calcifications.  Expected post biopsy changes are present without evidence of hematoma.  IMPRESSION: Appropriate positioning of the coil shaped tissue marker clip at the site of the biopsied calcifications in the LOWER OUTER QUADRANT of the RIGHT breast at MIDDLE depth. The clip is approximately 5 mm MEDIAL to the biopsied calcifications.  Final Assessment: Post Procedure Mammograms for Marker Placement   Electronically Signed By: [name] On: 01/08/2019 13:53  CLINICAL DATA: Status post stereotactic core biopsies of left breast calcifications  EXAM: DIAGNOSTIC LEFT MAMMOGRAM POST STEREOTACTIC BIOPSY  COMPARISON: Previous exam(s).  FINDINGS: Mammographic images were obtained following left breast stereotactic guided biopsy of calcifications in the lower lateral left breast, calcifications in the upper-outer left breast. Cc and lateral views of the left breast demonstrate coil biopsy clip in the lower lateral left breast expected area of concern, X  biopsy clip in the upper-outer left breast expected area of concern.  IMPRESSION: Post biopsy mammogram demonstrating biopsy clips in the expected  areas of concern left breast.  Final Assessment: Post Procedure Mammograms for Marker Placement   Electronically Signed By: Abelardo Diesel M.D. On: 01/14/2019 10:22   Past Surgical History Emeline Gins, [place]; 01/15/2019 11:26 AM) Colon Polyp Removal - Colonoscopy  Diagnostic Studies History Emeline Gins, CMA; 01/15/2019 11:26 AM) Colonoscopy within last year Mammogram within last year Pap Smear 1-5 years ago  Allergies Emeline Gins, [place]; 01/15/2019 11:27 AM) No Known Drug Allergies [01/15/2019]: Allergies Reconciled  Medication History Emeline Gins, [place]; 01/15/2019 11:28 AM) Medications Reconciled  Social History Emeline Gins, [place]; 01/15/2019 11:26 AM) Caffeine use Coffee. No alcohol use No drug use Tobacco use Former smoker.  Family History Emeline Gins, [place]; 01/15/2019 11:26 AM) Cancer Family Members In General. Cerebrovascular Accident Family Members In [place] Mother. Diabetes Mellitus Family Members In General. Hypertension Family Members In General, Father.  Pregnancy / Birth History Emeline Gins, [place]; 01/15/2019 11:26 AM) Age at menarche 2 years. Gravida 2 Length (months) of breastfeeding 12-24 Maternal age 72-40 Para 2 Regular periods  Other Problems Emeline Gins, CMA; 01/15/2019 11:26 AM) Cancer Hypercholesterolemia Thyroid Disease     Review of Systems Emeline Gins CMA; 01/15/2019 11:26 AM) General Present- Fatigue and Night Sweats. Not Present- Appetite Loss, Chills, Fever, Weight Gain and Weight Loss. Skin Present- Dryness. Not Present- Change in Wart/Mole, Hives, Jaundice, New Lesions, Non-Healing Wounds, Rash and Ulcer. HEENT Present- Sore Throat. Not Present- Earache, Hearing Loss, Hoarseness, Nose Bleed, Oral Ulcers, Ringing in  the Ears, Seasonal Allergies, Sinus Pain, Visual Disturbances, Wears glasses/contact lenses and Yellow Eyes. Breast Present- Breast Pain. Not Present- Breast Mass, Nipple Discharge and Skin Changes. Cardiovascular Present- Palpitations and Rapid Heart Rate. Not Present- Chest Pain, Difficulty Breathing Lying Down, Leg Cramps, Shortness of Breath and Swelling of Extremities. Gastrointestinal Present- Gets full quickly at meals. Not Present- Abdominal Pain, Bloating, Bloody Stool, Change in Bowel Habits, Chronic diarrhea, Constipation, Difficulty Swallowing, Excessive gas, Hemorrhoids, Indigestion, Nausea, Rectal Pain and Vomiting. Female Genitourinary Not Present- Frequency, Nocturia, Painful Urination, Pelvic Pain and Urgency. Neurological Present- Decreased Memory and Headaches. Not Present- Fainting, Numbness, Seizures, Tingling, Tremor, Trouble walking and Weakness. Psychiatric Present- Anxiety. Not Present- Bipolar, Change in Sleep Pattern, Depression, Fearful and Frequent crying. Endocrine Present- Excessive Hunger. Not Present- Cold Intolerance, Hair Changes, Heat Intolerance, Hot flashes and New Diabetes.  Vitals Emeline Gins CMA; 01/15/2019 11:27 AM) 01/15/2019 11:27 AM Weight: 150.8 lb Height: 65in Body Surface Area: 1.75 m Body Mass Index: 25.09 kg/m  Temp.: 98.53F  Pulse: 97 (Regular)  BP: 156/90 (Sitting, Left Arm, Standard)      Physical Exam Rodman Key K. Francesco Provencal MD; 01/15/2019 1:10 PM)  The physical exam findings are as follows: Note:WDWN in NAD Eyes: Pupils equal, round; sclera anicteric HENT: Oral mucosa moist; good dentition Neck: No masses palpated, no thyromegaly Lungs: CTA bilaterally; normal respiratory effort Breasts: symmetric; significant bruising in right lower quadrant near inframammary crease; no lymphadenopathy, no palpable masses; normal nipple left breast - healing biopsy sites in upper and lower outer quadrants; no lymphadenopathy; no palpable  masses; normal nipple CV: Regular rate and rhythm; no murmurs; extremities well-perfused with no edema Abd: +bowel sounds, soft, non-tender, no palpable organomegaly; no palpable hernias Skin: Warm, dry; no sign of jaundice Psychiatric - alert and oriented x 4; calm mood and affect    Assessment & Plan Rodman Key K. Tijana Walder MD; 01/15/2019 1:16 PM)  DUCTAL CARCINOMA IN SITU (DCIS) OF RIGHT BREAST (D05.11)  Current Plans Referred to Genetic Counseling, for evaluation and follow  up [facility]). Routine. Referred to Oncology, for evaluation and follow up (Oncology). Routine. Referred to Radiation Oncology, for evaluation and follow up (Radiation Oncology). Routine. Schedule for Surgery - Tentatively schedule bilateral radioactive seed localized lumpectomy. The surgical procedure has been discussed with the patient. Potential risks, benefits, alternative treatments, and expected outcomes have been explained. All of the patient's questions at this time have been answered. The likelihood of reaching the patient's treatment goal is good. The patient understand the proposed surgical procedure and wishes to proceed. Note:I spent over 60 minutes with the patient and her family members discussing the current findings as well as the expected course of her disease. I explained that the atypical lobular hyperplasia in the left breast was a marker for increased risk of breast cancer in the future. For this we recommend simple radioactive seed localized lumpectomy. There is no need to address the area of [place] at this time. We discussed that the core biopsy on the right side showed DCIS with no sign of invasive cancer. This is a relatively small area of calcifications. At this time, we would recommend radioactive seed localized lumpectomy for excision. We did discuss the possibility of finding invasive cancer in the specimen. This might require subsequent sentinel lymph node biopsy to rule out  lymph node metastases. I also discussed the need for genetic testing which may change all of her surgical plans. She has no family history of breast cancer but did have her first child at a later age.  I answered all of their questions to the best of my ability. We will put in an urgent referral for genetic counseling and testing as well as a consultation with medical oncology. We sent a referral also for radiation oncology but there is no urgency to have this consultation prior to surgery. We will tentatively schedule her for bilateral radioactive seed localized lumpectomies with the understanding that positive genetic testing may change our surgical plan.  We also briefly discussed possible bilateral nipple sparing mastectomies with immediate reconstruction by plastic surgery if genetic testing revealed high risk breast cancer gene.  Joann Meadows. [name], MD, [facility]  General/ Trauma Surgery [place] [phone]  01/15/2019 1:16 PM

## 2019-01-15 NOTE — H&P (View-Only) (Signed)
History of Present Illness Joann Meadows. Joann Pope MD; 01/15/2019 1:09 PM) Joann Meadows is a 43 year old female who presents with breast cancer. PCP - [place] Referred by GYN Joann Meadows for DCIS right breast/ ALH left breast  This is a healthy 43 year old [place] female with a history of acute myelogenous leukemia 21 years ago who presents after a recent routine screening mammogram. In Joann lateral inferior right breast there is a 5 mm area of pleomorphic calcifications. Joann upper outer left breast showed a loosely grouped 16 mm area of calcifications. Joann left lateral inferior breast showed a 3 mm area of round punctate calcifications. Initially she underwent core biopsy of Joann right breast calcifications. This returned a diagnosis of DCIS with lobular neoplasia, ER/PR positive. Subsequently she underwent biopsy of Joann 2 areas in Joann left breast. Joann loosely grouped 16 mm area of calcifications revealed a diagnosis of PASH. Joann lower Outer quadrant area of calcifications showed atypical lobular hyperplasia. She is here for her initial consultation. She is accompanied by her husband and sister-in-law.  No previous breast complaints AML treated at age 72. No residual problems.  Menarche - 84 First pregnancy - 34 Breast feed - yes Hormones - no Family history - negative for breast cancer; mother died of colon cancer at age 98.    Path - right breast - DCIS with lobular neoplasia, ER/ PR + Left breast - lower outer quadrant 3 mm calcs - atypical lobular hyperplasia Left breast - upper outer quadrant 16 mm calcs - PASH   CLINICAL DATA: Joann Meadows was called back from screening mammography due to bilateral breast calcifications.  EXAM: DIGITAL DIAGNOSTIC BILATERAL MAMMOGRAM  COMPARISON: Previous exam(s).  ACR Breast Density Category c: Joann breast tissue is heterogeneously dense, which may obscure small masses.  FINDINGS: There is a group of calcifications in Joann lateral inferior  right breast spanning 5 mm. These calcifications are pleomorphic. Joann remainder of Joann calcifications are scattered and not group.  There are loosely grouped calcifications in Joann upper-outer left breast spanning up to 16 mm, round and punctate in character. There is a smaller group of round and punctate calcifications in Joann lateral inferior left breast, spanning 3 mm. Joann remainder of Joann calcifications on Joann left are scattered.  IMPRESSION: There is a 5 mm group of indeterminate calcifications in Joann lateral inferior right breast. There are 2 indeterminate groups on Joann left as described above. Joann remainder of Joann bilateral calcifications are scattered and of no concern.  RECOMMENDATION: Recommend biopsying Joann lateral inferior 5 mm group of calcifications on Joann right. If this biopsy is benign, recommend six-month follow-up of Joann left breast calcifications. If Joann biopsy requires surgery, recommend biopsying Joann 2 groups of indeterminate calcifications on Joann left.  I have discussed Joann findings and recommendations with Joann Meadows. Results were also provided in writing at Joann conclusion of Joann visit. If applicable, a reminder letter will be sent to Joann Meadows regarding Joann next appointment.  BI-RADS CATEGORY 4: Suspicious.   Electronically Signed By: [name] On: 01/05/2019 15:30  CLINICAL DATA: 43 year old with screening detected 5 mm group of indeterminate calcifications involving Joann LOWER OUTER QUADRANT of Joann RIGHT breast at MIDDLE depth. Meadows states she had multiple episodes of RIGHT breast mastitis while breast feeding (which she discontinued approximately 6 months ago). Personal history of leukemia 20+ years ago.  EXAM: RIGHT BREAST STEREOTACTIC CORE NEEDLE BIOPSY  COMPARISON: Previous exams.  FINDINGS: Joann Meadows and I  discussed Joann procedure of stereotactic-guided biopsy including benefits and alternatives. We discussed Joann  high likelihood of a successful procedure. We discussed Joann risks of Joann procedure including infection, bleeding, tissue injury, clip migration, and inadequate sampling. Informed written consent was given. Joann usual time out protocol was performed immediately prior to Joann procedure.  Using sterile technique with chlorhexidine as skin antisepsis, 1% lidocaine and 1% lidocaine with epinephrine as local anesthetic, under stereotactic guidance, a 9 gauge Brevera vacuum assisted device was used to perform core needle biopsy of calcifications involving Joann LOWER OUTER QUADRANT of Joann RIGHT breast at MIDDLE depth using a LATERAL approach. Specimen radiograph was performed showing calcifications in at least 2 of Joann core samples. Specimens with calcifications are identified for pathology.  Lesion quadrant: LOWER OUTER QUADRANT.  At Joann conclusion of Joann procedure, a coil shaped tissue marker clip was deployed into Joann biopsy cavity. Follow-up 2-view mammogram was performed and dictated separately.  IMPRESSION: Stereotactic-guided biopsy of indeterminate calcifications involving Joann LOWER OUTER QUADRANT of Joann RIGHT breast at MIDDLE depth. No apparent complications.  Electronically Signed: By: [name] On: 01/08/2019 13:50  ADDENDUM: Pathology revealed DUCTAL CARCINOMA IN SITU, LOBULAR NEOPLASIA with CALCIFICATIONS of Joann RIGHT breast, lower outer quadrant at middle depth. This was found to be concordant by Dr. [name].  Pathology results were discussed with Joann Meadows by telephone. Joann Meadows reported doing well after Joann biopsy with tenderness at Joann site. Post biopsy instructions and care were reviewed and questions were answered. Joann Meadows was encouraged to call Joann Bailey for any additional concerns.  Surgical consultation has been arranged with Dr. [name] at [facility] on 01-15-2019. Meadows is scheduled for  biopsies at [facility] for LEFT breast CALCIFICATIONS on 01-14-19 at 1130.  Pathology results reported by [name], RN on 01/12/2019.   Electronically Signed By: [name] On: 01/12/2019 08:17  CLINICAL DATA: Confirmation of clip placement after stereotactic tomosynthesis guided core needle biopsy of calcifications involving Joann LOWER OUTER QUADRANT of Joann RIGHT breast at MIDDLE depth.  EXAM: DIAGNOSTIC RIGHT MAMMOGRAM POST STEREOTACTIC BIOPSY  COMPARISON: Previous exam(s).  FINDINGS: Mammographic images were obtained following stereotactic tomosynthesis guided biopsy of calcifications involving Joann LOWER OUTER QUADRANT of Joann RIGHT breast at MIDDLE depth. Joann coil shaped tissue marker clip is appropriately position at Joann site of Joann biopsied calcifications. There are residual calcifications from Joann posterior aspect of Joann biopsied group, and Joann clip is approximately 5 mm MEDIAL to Joann calcifications.  Expected post biopsy changes are present without evidence of hematoma.  IMPRESSION: Appropriate positioning of Joann coil shaped tissue marker clip at Joann site of Joann biopsied calcifications in Joann LOWER OUTER QUADRANT of Joann RIGHT breast at MIDDLE depth. Joann clip is approximately 5 mm MEDIAL to Joann biopsied calcifications.  Final Assessment: Post Procedure Mammograms for Marker Placement   Electronically Signed By: [name] On: 01/08/2019 13:53  CLINICAL DATA: Status post stereotactic core biopsies of left breast calcifications  EXAM: DIAGNOSTIC LEFT MAMMOGRAM POST STEREOTACTIC BIOPSY  COMPARISON: Previous exam(s).  FINDINGS: Mammographic images were obtained following left breast stereotactic guided biopsy of calcifications in Joann lower lateral left breast, calcifications in Joann upper-outer left breast. Cc and lateral views of Joann left breast demonstrate coil biopsy clip in Joann lower lateral left breast expected area of concern, X  biopsy clip in Joann upper-outer left breast expected area of concern.  IMPRESSION: Post biopsy mammogram demonstrating biopsy clips in Joann expected  areas of concern left breast.  Final Assessment: Post Procedure Mammograms for Marker Placement   Electronically Signed By: Abelardo Diesel M.D. On: 01/14/2019 10:22   Past Surgical History Emeline Gins, [place]; 01/15/2019 11:26 AM) Colon Polyp Removal - Colonoscopy  Diagnostic Studies History Emeline Gins, CMA; 01/15/2019 11:26 AM) Colonoscopy within last year Mammogram within last year Pap Smear 1-5 years ago  Allergies Emeline Gins, [place]; 01/15/2019 11:27 AM) No Known Drug Allergies [01/15/2019]: Allergies Reconciled  Medication History Emeline Gins, [place]; 01/15/2019 11:28 AM) Medications Reconciled  Social History Emeline Gins, [place]; 01/15/2019 11:26 AM) Caffeine use Coffee. No alcohol use No drug use Tobacco use Former smoker.  Family History Emeline Gins, [place]; 01/15/2019 11:26 AM) Cancer Family Members In General. Cerebrovascular Accident Family Members In [place] Mother. Diabetes Mellitus Family Members In General. Hypertension Family Members In General, Father.  Pregnancy / Birth History Emeline Gins, [place]; 01/15/2019 11:26 AM) Age at menarche 24 years. Gravida 2 Length (months) of breastfeeding 12-24 Maternal age 24-40 Para 2 Regular periods  Other Problems Emeline Gins, CMA; 01/15/2019 11:26 AM) Cancer Hypercholesterolemia Thyroid Disease     Review of Systems Emeline Gins CMA; 01/15/2019 11:26 AM) General Present- Fatigue and Night Sweats. Not Present- Appetite Loss, Chills, Fever, Weight Gain and Weight Loss. Skin Present- Dryness. Not Present- Change in Wart/Mole, Hives, Jaundice, New Lesions, Non-Healing Wounds, Rash and Ulcer. HEENT Present- Sore Throat. Not Present- Earache, Hearing Loss, Hoarseness, Nose Bleed, Oral Ulcers, Ringing in  Joann Ears, Seasonal Allergies, Sinus Pain, Visual Disturbances, Wears glasses/contact lenses and Yellow Eyes. Breast Present- Breast Pain. Not Present- Breast Mass, Nipple Discharge and Skin Changes. Cardiovascular Present- Palpitations and Rapid Heart Rate. Not Present- Chest Pain, Difficulty Breathing Lying Down, Leg Cramps, Shortness of Breath and Swelling of Extremities. Gastrointestinal Present- Gets full quickly at meals. Not Present- Abdominal Pain, Bloating, Bloody Stool, Change in Bowel Habits, Chronic diarrhea, Constipation, Difficulty Swallowing, Excessive gas, Hemorrhoids, Indigestion, Nausea, Rectal Pain and Vomiting. Female Genitourinary Not Present- Frequency, Nocturia, Painful Urination, Pelvic Pain and Urgency. Neurological Present- Decreased Memory and Headaches. Not Present- Fainting, Numbness, Seizures, Tingling, Tremor, Trouble walking and Weakness. Psychiatric Present- Anxiety. Not Present- Bipolar, Change in Sleep Pattern, Depression, Fearful and Frequent crying. Endocrine Present- Excessive Hunger. Not Present- Cold Intolerance, Hair Changes, Heat Intolerance, Hot flashes and New Diabetes.  Vitals Emeline Gins CMA; 01/15/2019 11:27 AM) 01/15/2019 11:27 AM Weight: 150.8 lb Height: 65in Body Surface Area: 1.75 m Body Mass Index: 25.09 kg/m  Temp.: 98.58F  Pulse: 97 (Regular)  BP: 156/90 (Sitting, Left Arm, Standard)      Physical Exam Rodman Key K. Rea Reser MD; 01/15/2019 1:10 PM)  Joann physical exam findings are as follows: Note:WDWN in NAD Eyes: Pupils equal, round; sclera anicteric HENT: Oral mucosa moist; good dentition Neck: No masses palpated, no thyromegaly Lungs: CTA bilaterally; normal respiratory effort Breasts: symmetric; significant bruising in right lower quadrant near inframammary crease; no lymphadenopathy, no palpable masses; normal nipple left breast - healing biopsy sites in upper and lower outer quadrants; no lymphadenopathy; no palpable  masses; normal nipple CV: Regular rate and rhythm; no murmurs; extremities well-perfused with no edema Abd: +bowel sounds, soft, non-tender, no palpable organomegaly; no palpable hernias Skin: Warm, dry; no sign of jaundice Psychiatric - alert and oriented x 4; calm mood and affect    Assessment & Plan Rodman Key K. Sharae Zappulla MD; 01/15/2019 1:16 PM)  DUCTAL CARCINOMA IN SITU (DCIS) OF RIGHT BREAST (D05.11)  Current Plans Referred to Genetic Counseling, for evaluation and follow  up [facility]). Routine. Referred to Oncology, for evaluation and follow up (Oncology). Routine. Referred to Radiation Oncology, for evaluation and follow up (Radiation Oncology). Routine. Schedule for Surgery - Tentatively schedule bilateral radioactive seed localized lumpectomy. Joann surgical procedure has been discussed with Joann Meadows. Potential risks, benefits, alternative treatments, and expected outcomes have been explained. All of Joann Meadows's questions at this time have been answered. Joann likelihood of reaching Joann Meadows's treatment goal is good. Joann Meadows understand Joann proposed surgical procedure and wishes to proceed. Note:I spent over 60 minutes with Joann Meadows and her family members discussing Joann current findings as well as Joann expected course of her disease. I explained that Joann atypical lobular hyperplasia in Joann left breast was a marker for increased risk of breast cancer in Joann future. For this we recommend simple radioactive seed localized lumpectomy. There is no need to address Joann area of [place] at this time. We discussed that Joann core biopsy on Joann right side showed DCIS with no sign of invasive cancer. This is a relatively small area of calcifications. At this time, we would recommend radioactive seed localized lumpectomy for excision. We did discuss Joann possibility of finding invasive cancer in Joann specimen. This might require subsequent sentinel lymph node biopsy to rule out  lymph node metastases. I also discussed Joann need for genetic testing which may change all of her surgical plans. She has no family history of breast cancer but did have her first child at a later age.  I answered all of their questions to Joann best of my ability. We will put in an urgent referral for genetic counseling and testing as well as a consultation with medical oncology. We sent a referral also for radiation oncology but there is no urgency to have this consultation prior to surgery. We will tentatively schedule her for bilateral radioactive seed localized lumpectomies with Joann understanding that positive genetic testing may change our surgical plan.  We also briefly discussed possible bilateral nipple sparing mastectomies with immediate reconstruction by plastic surgery if genetic testing revealed high risk breast cancer gene.  Joann Meadows. [name], MD, [facility]  General/ Trauma Surgery [place] [phone]  01/15/2019 1:16 PM

## 2019-01-18 ENCOUNTER — Other Ambulatory Visit: Payer: Self-pay | Admitting: Surgery

## 2019-01-18 DIAGNOSIS — N6092 Unspecified benign mammary dysplasia of left breast: Secondary | ICD-10-CM

## 2019-01-20 ENCOUNTER — Encounter: Payer: Self-pay | Admitting: Hematology and Oncology

## 2019-01-20 ENCOUNTER — Telehealth: Payer: Self-pay | Admitting: Hematology and Oncology

## 2019-01-20 NOTE — Telephone Encounter (Signed)
A genetic counseling appt has been scheduled for the patient on 3/2 at 8am. A medonc appt has been scheduled for the pt to see Dr. Lindi Adie on 3/16 at 1pm. Letters mailed to the pt.

## 2019-01-25 ENCOUNTER — Inpatient Hospital Stay: Payer: BLUE CROSS/BLUE SHIELD | Attending: Genetic Counselor | Admitting: Genetic Counselor

## 2019-01-25 ENCOUNTER — Inpatient Hospital Stay: Payer: BLUE CROSS/BLUE SHIELD

## 2019-01-25 ENCOUNTER — Encounter: Payer: Self-pay | Admitting: Genetic Counselor

## 2019-01-25 DIAGNOSIS — Z800 Family history of malignant neoplasm of digestive organs: Secondary | ICD-10-CM

## 2019-01-25 DIAGNOSIS — C50919 Malignant neoplasm of unspecified site of unspecified female breast: Secondary | ICD-10-CM | POA: Insufficient documentation

## 2019-01-25 DIAGNOSIS — C9200 Acute myeloblastic leukemia, not having achieved remission: Secondary | ICD-10-CM | POA: Insufficient documentation

## 2019-01-25 DIAGNOSIS — Z1379 Encounter for other screening for genetic and chromosomal anomalies: Secondary | ICD-10-CM

## 2019-01-25 DIAGNOSIS — C9201 Acute myeloblastic leukemia, in remission: Secondary | ICD-10-CM

## 2019-01-25 DIAGNOSIS — Z87891 Personal history of nicotine dependence: Secondary | ICD-10-CM | POA: Insufficient documentation

## 2019-01-25 DIAGNOSIS — Z806 Family history of leukemia: Secondary | ICD-10-CM

## 2019-01-25 DIAGNOSIS — D0511 Intraductal carcinoma in situ of right breast: Secondary | ICD-10-CM | POA: Diagnosis not present

## 2019-01-25 NOTE — Progress Notes (Signed)
REFERRING PROVIDER: Nicholas Lose, MD [place], [place] [id number]   Donnie Mesa MD  PRIMARY PROVIDER:  [name], Carlean Jews.Marlou Sa, MD  PRIMARY REASON FOR VISIT:  1. Family history of colon cancer   2. Family history of leukemia   3. Ductal carcinoma in situ (DCIS) of right breast   4. Acute myeloid leukemia in remission ([place])      HISTORY OF PRESENT ILLNESS:   Joann Meadows, a 43 y.o. female, was seen for a [facility] cancer genetics consultation at the request of Dr. Lindi Adie due to a personal and family history of cancer.  Joann Meadows presents to clinic today to discuss the possibility of a hereditary predisposition to cancer, genetic testing, and to further clarify her future cancer risks, as well as potential cancer risks for family members.   In 1998, at the age of 20, Joann Meadows was diagnosed with AML. This was treated with chemotherapy only.  In 2020, at the age of 48, Joann Meadows was diagnosed with DCIS of the right breast.  This will be treated with lumpectomy on 02/11/2019.     CANCER HISTORY:   No history exists.     HORMONAL RISK FACTORS:  Menarche was at age 64.  First live birth at age 62.  OCP use for approximately 0 years.  Ovaries intact: yes.  Hysterectomy: no.  Menopausal status: premenopausal.  HRT use: 0 years. Colonoscopy: yes; 2 polyps total. Mammogram within the last year: yes. Number of breast biopsies: 1. Up to date with pelvic exams:  yes. Any excessive radiation exposure in the past:  no  Past Medical History:  Diagnosis Date  . Blood transfusion without reported diagnosis    leaukemia 18 years ago  . Breast cancer ([place])   . Cholestasis during pregnancy   . Endometriosis   . Family history of colon cancer   . Family history of leukemia   . Fibroid   . Leukemia [facility])     Past Surgical History:  Procedure Laterality Date  . NO PAST SURGERIES      Social History   Socioeconomic History  . Marital status: Single     Spouse name: Not on file  . Number of children: Not on file  . Years of education: Not on file  . Highest education level: Not on file  Occupational History  . Not on file  Social Needs  . Financial resource strain: Not on file  . Food insecurity:    Worry: Not on file    Inability: Not on file  . Transportation needs:    Medical: Not on file    Non-medical: Not on file  Tobacco Use  . Smoking status: Former Smoker    Years: 10.00    Types: Cigarettes    Last attempt to quit: 01/25/2015    Years since quitting: 4.0  . Smokeless tobacco: Never Used  Substance and Sexual Activity  . Alcohol use: No  . Drug use: No  . Sexual activity: Yes    Birth control/protection: None  Lifestyle  . Physical activity:    Days per week: Not on file    Minutes per session: Not on file  . Stress: Not on file  Relationships  . Social connections:    Talks on phone: Not on file    Gets together: Not on file    Attends religious service: Not on file    Active member of club or organization: Not on file    Attends meetings  of clubs or organizations: Not on file    Relationship status: Not on file  Other Topics Concern  . Not on file  Social History Narrative  . Not on file     FAMILY HISTORY:  We obtained a detailed, 4-generation family history.  Significant diagnoses are listed below: Family History  Problem Relation Age of Onset  . Colon cancer Mother 94       d. 20  . Hypertension Father   . Diabetes Maternal Aunt   . Diabetes Maternal Uncle   . Mental retardation Maternal Uncle   . Diabetes Paternal Aunt   . Diabetes Maternal Grandmother   . Stroke Paternal Grandmother        c. 4  . Leukemia Paternal Grandfather        d. 36; ?CLL  . Leukemia Cousin 4       mat first cousin  . Lymphoma Cousin 40       pat first cousin    The patient has two children who are cancer free.  She has a brother who is cancer free.  Her father is living and her mother is deceased.  The  patient's mother was diagnosed with colon cancer at 31 and died at 84.  She had three brothers and a sister who are cancer free.  One brother had mental retardation and is now deceased.  The maternal grandparents are deceased from non cancer related issues.  The aperient's father is living.  He has one brother and two sisters who are cancer free.  His brother's son has Hodgkin's lymphoma.  The paternal grandparents are deceased.  The grandfather had CLL, and the grandmother had a stroke.  The patient' reports that her father's cousin (unsure if it was a maternal or paternal cousin) had leukemia, and that cousin's twin had a sarcoma.  Joann Meadows is unaware of previous family history of genetic testing for hereditary cancer risks. Patient's maternal ancestors are of [place] descent, and paternal ancestors are of [place] descent. There is no reported Ashkenazi Jewish ancestry. There is no known consanguinity.  GENETIC COUNSELING ASSESSMENT: Joann Meadows is a 43 y.o. female with a personal history of breast cancer and AML and a family history of colon cancer and leukemia which is somewhat suggestive of a hereditary cancer syndrome and predisposition to cancer. We, therefore, discussed and recommended the following at today's visit.   DISCUSSION: We discussed that 5-10% of breast cancer is hereditary with most cases due to BRCA mutations.  There are other genes that can increase the risk for breast cancer, and understanding that she has a history of AML, we can look at some of the Fanconi anemia genes, such as ATM, PALB2, and others.  Her mother has a history of early onset colon cancer, which can be the result of Lynch syndrome or even MUTYH, which is recessive.    The patient's history of AML at a young age, along with a family history of leukemia in a young child can also be associated with hereditary cancer syndromes.  We offered genetic testing for this condition as well.    We reviewed the  characteristics, features and inheritance patterns of hereditary cancer syndromes. We also discussed genetic testing, including the appropriate family members to test, the process of testing, insurance coverage and turn-around-time for results. We discussed the implications of a negative, positive and/or variant of uncertain significant result. In order to get genetic test results in a timely manner so that Joann Meadows  can use these genetic test results for surgical decisions, we recommended Joann Meadows pursue genetic testing for the STAT panel. If this test is negative, we then recommend Joann Meadows pursue reflex genetic testing to the multi cancer gene panel. The Multi-Gene Panel offered by [facility] includes sequencing and/or deletion duplication testing of the following 84 genes: AIP, ALK, APC, ATM, AXIN2,BAP1,  BARD1, BLM, BMPR1A, BRCA1, BRCA2, BRIP1, CASR, CDC73, CDH1, CDK4, CDKN1B, CDKN1C, CDKN2A (p14ARF), CDKN2A (p16INK4a), CEBPA, CHEK2, CTNNA1, DICER1, DIS3L2, EGFR (c.2369C>T, p.Thr790Met variant only), EPCAM (Deletion/duplication testing only), FH, FLCN, GATA2, GPC3, GREM1 (Promoter region deletion/duplication testing only), HOXB13 (c.251G>A, p.Gly84Glu), HRAS, KIT, MAX, MEN1, MET, MITF (c.952G>A, p.Glu318Lys variant only), MLH1, MSH2, MSH3, MSH6, MUTYH, NBN, NF1, NF2, NTHL1, PALB2, PDGFRA, PHOX2B, PMS2, POLD1, POLE, POT1, PRKAR1A, PTCH1, PTEN, RAD50, RAD51C, RAD51D, RB1, RECQL4, RET, RUNX1, SDHAF2, SDHA (sequence changes only), SDHB, SDHC, SDHD, SMAD4, SMARCA4, SMARCB1, SMARCE1, STK11, SUFU, TERC, TERT, TMEM127, TP53, TSC1, TSC2, VHL, WRN and WT1.    Based on Joann Meadows's personal and family history of cancer, she meets medical criteria for genetic testing. Despite that she meets criteria, she may still have an out of pocket cost. We discussed that if her out of pocket cost for testing is over $100, the laboratory will call and confirm whether she wants to proceed with testing.  If the out of pocket cost  of testing is less than $100 she will be billed by the genetic testing laboratory.   PLAN: After considering the risks, benefits, and limitations, Joann Meadows  provided informed consent to pursue genetic testing and the blood sample was sent to [facility] for analysis of the STAT panel.  She will reconsider the remainder of the panel testing after these results come back. Results should be available within approximately 5-10 days' time, at which point they will be disclosed by telephone to Joann Meadows, as will any additional recommendations warranted by these results. Joann Meadows will receive a summary of her genetic counseling visit and a copy of her results once available. This information will also be available in Epic. We encouraged Joann Meadows to remain in contact with cancer genetics annually so that we can continuously update the family history and inform her of any changes in cancer genetics and testing that may be of benefit for her family. Joann Meadows's questions were answered to her satisfaction today. Our contact information was provided should additional questions or concerns arise.  Lastly, we encouraged Joann Meadows to remain in contact with cancer genetics annually so that we can continuously update the family history and inform her of any changes in cancer genetics and testing that may be of benefit for this family.   Ms.  Meadows's questions were answered to her satisfaction today. Our contact information was provided should additional questions or concerns arise. Thank you for the referral and allowing [place] to share in the care of your patient.   Karen P. Florene Glen, [place], [facility] Certified Genetic Counselor [email or web address] phone: [phone]  The patient was seen for a total of 56 minutes in face-to-face genetic counseling.  This patient was discussed with Drs. Magrinat, Lindi Adie and/or Burr Medico who agrees with the above.     _______________________________________________________________________ For Office Staff:  Number of people involved in session: 1 Was an Intern/ student involved with case: no

## 2019-01-28 DIAGNOSIS — D0511 Intraductal carcinoma in situ of right breast: Secondary | ICD-10-CM | POA: Diagnosis not present

## 2019-02-01 ENCOUNTER — Encounter (HOSPITAL_BASED_OUTPATIENT_CLINIC_OR_DEPARTMENT_OTHER): Payer: Self-pay | Admitting: *Deleted

## 2019-02-01 ENCOUNTER — Other Ambulatory Visit: Payer: Self-pay

## 2019-02-02 ENCOUNTER — Ambulatory Visit: Payer: Self-pay | Admitting: Genetic Counselor

## 2019-02-02 ENCOUNTER — Encounter: Payer: Self-pay | Admitting: Genetic Counselor

## 2019-02-02 ENCOUNTER — Telehealth: Payer: Self-pay | Admitting: Genetic Counselor

## 2019-02-02 DIAGNOSIS — Z1379 Encounter for other screening for genetic and chromosomal anomalies: Secondary | ICD-10-CM

## 2019-02-02 DIAGNOSIS — Z5111 Encounter for antineoplastic chemotherapy: Secondary | ICD-10-CM | POA: Insufficient documentation

## 2019-02-02 NOTE — Telephone Encounter (Signed)
Discussed that STAT panel was negative, other than an ATM VUS idnetified.  We will reflex to the larger Multi-cancer panel.

## 2019-02-02 NOTE — Telephone Encounter (Signed)
Revealed negative genetic testing.  Discussed that we do not know why she has breast cancer or why there is cancer in the family. It could be due to a different gene that we are not testing, or maybe our current technology may not be able to pick something up.  It will be important for her to keep in contact with genetics to keep up with whether additional testing may be needed.  Revealed two VUS, but otherwise negative testing.  We will not change medical management due to these VUS'.

## 2019-02-02 NOTE — Progress Notes (Signed)
HPI:  Joann Meadows was previously seen in the [place] clinic due to a personal and family history of cancer and concerns regarding a hereditary predisposition to cancer. Please refer to our prior cancer genetics clinic note for more information regarding our discussion, assessment and recommendations, at the time. Joann Meadows's recent genetic test results were disclosed to her, as were recommendations warranted by these results. These results and recommendations are discussed in more detail below.  CANCER HISTORY:    Ductal carcinoma in situ (DCIS) of right breast   01/25/2019 Initial Diagnosis    Ductal carcinoma in situ (DCIS) of right breast    01/31/2019 Genetic Testing    ATM c.8495G>A and RECQL4 c.2587G>A VUS identified on the 9-gene STAT panel and Multi-cancer panel through [facility].  The STAT Breast cancer panel offered by [facility] includes sequencing and rearrangement analysis for the following 9 genes:  ATM, BRCA1, BRCA2, CDH1, CHEK2, PALB2, PTEN, STK11 and TP53.   The Multi-Gene Panel offered by [facility] includes sequencing and/or deletion duplication testing of the following 84 genes: AIP, ALK, APC, ATM, AXIN2,BAP1,  BARD1, BLM, BMPR1A, BRCA1, BRCA2, BRIP1, CASR, CDC73, CDH1, CDK4, CDKN1B, CDKN1C, CDKN2A (p14ARF), CDKN2A (p16INK4a), CEBPA, CHEK2, CTNNA1, DICER1, DIS3L2, EGFR (c.2369C>T, p.Thr790Met variant only), EPCAM (Deletion/duplication testing only), FH, FLCN, GATA2, GPC3, GREM1 (Promoter region deletion/duplication testing only), HOXB13 (c.251G>A, p.Gly84Glu), HRAS, KIT, MAX, MEN1, MET, MITF (c.952G>A, p.Glu318Lys variant only), MLH1, MSH2, MSH3, MSH6, MUTYH, NBN, NF1, NF2, NTHL1, PALB2, PDGFRA, PHOX2B, PMS2, POLD1, POLE, POT1, PRKAR1A, PTCH1, PTEN, RAD50, RAD51C, RAD51D, RB1, RECQL4, RET, RUNX1, SDHAF2, SDHA (sequence changes only), SDHB, SDHC, SDHD, SMAD4, SMARCA4, SMARCB1, SMARCE1, STK11, SUFU, TERC, TERT, TMEM127, TP53, TSC1, TSC2, VHL, WRN and WT1.  he report date is February 02, 2019.     AML (acute myeloid leukemia) ([place])   01/25/2019 Initial Diagnosis    AML (acute myeloid leukemia) ([place])    02/02/2019 Genetic Testing    ATM c.8495G>A and RECQL4 c.2587G>A VUS identified on the 9-gene STAT panel and Multi-cancer panel through [facility].  The STAT Breast cancer panel offered by [facility] includes sequencing and rearrangement analysis for the following 9 genes:  ATM, BRCA1, BRCA2, CDH1, CHEK2, PALB2, PTEN, STK11 and TP53.   The Multi-Gene Panel offered by [facility] includes sequencing and/or deletion duplication testing of the following 84 genes: AIP, ALK, APC, ATM, AXIN2,BAP1,  BARD1, BLM, BMPR1A, BRCA1, BRCA2, BRIP1, CASR, CDC73, CDH1, CDK4, CDKN1B, CDKN1C, CDKN2A (p14ARF), CDKN2A (p16INK4a), CEBPA, CHEK2, CTNNA1, DICER1, DIS3L2, EGFR (c.2369C>T, p.Thr790Met variant only), EPCAM (Deletion/duplication testing only), FH, FLCN, GATA2, GPC3, GREM1 (Promoter region deletion/duplication testing only), HOXB13 (c.251G>A, p.Gly84Glu), HRAS, KIT, MAX, MEN1, MET, MITF (c.952G>A, p.Glu318Lys variant only), MLH1, MSH2, MSH3, MSH6, MUTYH, NBN, NF1, NF2, NTHL1, PALB2, PDGFRA, PHOX2B, PMS2, POLD1, POLE, POT1, PRKAR1A, PTCH1, PTEN, RAD50, RAD51C, RAD51D, RB1, RECQL4, RET, RUNX1, SDHAF2, SDHA (sequence changes only), SDHB, SDHC, SDHD, SMAD4, SMARCA4, SMARCB1, SMARCE1, STK11, SUFU, TERC, TERT, TMEM127, TP53, TSC1, TSC2, VHL, WRN and WT1.  he report date is February 02, 2019.     FAMILY HISTORY:  We obtained a detailed, 4-generation family history.  Significant diagnoses are listed below: Family History  Problem Relation Age of Onset  . Colon cancer Mother 42       d. 37  . Hypertension Father   . Diabetes Maternal Aunt   . Diabetes Maternal Uncle   . Mental retardation Maternal Uncle   . Diabetes Paternal Aunt   . Diabetes Maternal Grandmother   . Stroke Paternal Grandmother  c. 68  . Leukemia Paternal Grandfather        d. 33; ?CLL  . Leukemia Cousin 4       mat first cousin  .  Lymphoma Cousin 40       pat first cousin    The patient has two children who are cancer free.  She has a brother who is cancer free.  Her father is living and her mother is deceased.  The patient's mother was diagnosed with colon cancer at 39 and died at 57.  She had three brothers and a sister who are cancer free.  One brother had mental retardation and is now deceased.  The maternal grandparents are deceased from non cancer related issues.  The aperient's father is living.  He has one brother and two sisters who are cancer free.  His brother's son has Hodgkin's lymphoma.  The paternal grandparents are deceased.  The grandfather had CLL, and the grandmother had a stroke.  The patient' reports that her father's cousin (unsure if it was a maternal or paternal cousin) had leukemia, and that cousin's twin had a sarcoma.  Joann Meadows is unaware of previous family history of genetic testing for hereditary cancer risks. Patient's maternal ancestors are of [place] descent, and paternal ancestors are of [place] descent. There is no reported Ashkenazi Jewish ancestry. There is no known consanguinity.    GENETIC TEST RESULTS: Genetic testing reported out on February 02, 2019 through the Multi-cancer panel found no pathogenic mutations. The Multi-Gene Panel offered by [facility] includes sequencing and/or deletion duplication testing of the following 84 genes: AIP, ALK, APC, ATM, AXIN2,BAP1,  BARD1, BLM, BMPR1A, BRCA1, BRCA2, BRIP1, CASR, CDC73, CDH1, CDK4, CDKN1B, CDKN1C, CDKN2A (p14ARF), CDKN2A (p16INK4a), CEBPA, CHEK2, CTNNA1, DICER1, DIS3L2, EGFR (c.2369C>T, p.Thr790Met variant only), EPCAM (Deletion/duplication testing only), FH, FLCN, GATA2, GPC3, GREM1 (Promoter region deletion/duplication testing only), HOXB13 (c.251G>A, p.Gly84Glu), HRAS, KIT, MAX, MEN1, MET, MITF (c.952G>A, p.Glu318Lys variant only), MLH1, MSH2, MSH3, MSH6, MUTYH, NBN, NF1, NF2, NTHL1, PALB2, PDGFRA, PHOX2B, PMS2, POLD1, POLE, POT1,  PRKAR1A, PTCH1, PTEN, RAD50, RAD51C, RAD51D, RB1, RECQL4, RET, RUNX1, SDHAF2, SDHA (sequence changes only), SDHB, SDHC, SDHD, SMAD4, SMARCA4, SMARCB1, SMARCE1, STK11, SUFU, TERC, TERT, TMEM127, TP53, TSC1, TSC2, VHL, WRN and WT1.  The test report has been scanned into EPIC and is located under the Molecular Pathology section of the Results Review tab.  A portion of the result report is included below for reference.     We discussed with Joann Meadows that because current genetic testing is not perfect, it is possible there may be a gene mutation in one of these genes that current testing cannot detect, but that chance is small.  We also discussed, that there could be another gene that has not yet been discovered, or that we have not yet tested, that is responsible for the cancer diagnoses in the family. It is also possible there is a hereditary cause for the cancer in the family that Joann Meadows did not inherit and therefore was not identified in her testing.  Therefore, it is important to remain in touch with cancer genetics in the future so that we can continue to offer Joann Meadows the most up to date genetic testing.   Genetic testing did identify two Variants of uncertain significance (VUS) - one in the ATM gene called c.8495G>A, and a second in the RECQL4 gene called c.2587G>A.  At this time, it is unknown if these variants are associated with increased cancer risk or if they  are normal findings, but most variants such as these get reclassified to being inconsequential. They should not be used to make medical management decisions. With time, we suspect the lab will determine the significance of these variants, if any. If we do learn more about them, we will try to contact Joann Meadows to discuss it further. However, it is important to stay in touch with [place] periodically and keep the address and phone number up to date.  CANCER SCREENING RECOMMENDATIONS: Joann Meadows's test result is considered negative  (normal).  This means that we have not identified a hereditary cause for her personal and family history of cancer at this time. Most cancers happen by chance and this negative test suggests that her cancer may fall into this category.    While reassuring, this does not definitively rule out a hereditary predisposition to cancer. It is still possible that there could be genetic mutations that are undetectable by current technology. There could be genetic mutations in genes that have not been tested or identified to increase cancer risk.  Therefore, it is recommended she continue to follow the cancer management and screening guidelines provided by her oncology and primary healthcare provider.   An individual's cancer risk and medical management are not determined by genetic test results alone. Overall cancer risk assessment incorporates additional factors, including personal medical history, family history, and any available genetic information that may result in a personalized plan for cancer prevention and surveillance  RECOMMENDATIONS FOR FAMILY MEMBERS:  Individuals in this family might be at some increased risk of developing cancer, over the general population risk, simply due to the family history of cancer.  We recommended women in this family have a yearly mammogram beginning at age 41, or 12 years younger than the earliest onset of cancer, an annual clinical breast exam, and perform monthly breast self-exams. Women in this family should also have a gynecological exam as recommended by their primary provider. All family members should have a colonoscopy by age 73.  FOLLOW-UP: Lastly, we discussed with Joann Meadows that cancer genetics is a rapidly advancing field and it is possible that new genetic tests will be appropriate for her and/or her family members in the future. We encouraged her to remain in contact with cancer genetics on an annual basis so we can update her personal and family histories  and let her know of advances in cancer genetics that may benefit this family.   Our contact number was provided. Joann Meadows's questions were answered to her satisfaction, and she knows she is welcome to call us at anytime with additional questions or concerns.   Roma Kayser, MS, [facility] Certified Genetic Counselor [email or web address]

## 2019-02-08 ENCOUNTER — Telehealth: Payer: Self-pay | Admitting: Hematology and Oncology

## 2019-02-08 ENCOUNTER — Inpatient Hospital Stay (HOSPITAL_BASED_OUTPATIENT_CLINIC_OR_DEPARTMENT_OTHER): Payer: BLUE CROSS/BLUE SHIELD | Admitting: Hematology and Oncology

## 2019-02-08 ENCOUNTER — Encounter: Payer: Self-pay | Admitting: *Deleted

## 2019-02-08 ENCOUNTER — Other Ambulatory Visit: Payer: Self-pay

## 2019-02-08 DIAGNOSIS — C9200 Acute myeloblastic leukemia, not having achieved remission: Secondary | ICD-10-CM | POA: Diagnosis not present

## 2019-02-08 DIAGNOSIS — D0511 Intraductal carcinoma in situ of right breast: Secondary | ICD-10-CM | POA: Diagnosis not present

## 2019-02-08 DIAGNOSIS — Z87891 Personal history of nicotine dependence: Secondary | ICD-10-CM | POA: Diagnosis not present

## 2019-02-08 NOTE — Progress Notes (Signed)
[facility] NOTE  Patient Care Team: [name], L.Marlou Sa, MD as PCP - General (Family Medicine)  CHIEF COMPLAINTS/PURPOSE OF CONSULTATION:  Newly diagnosed breast cancer  HISTORY OF PRESENTING ILLNESS:  Joann Meadows 43 y.o. female is here because of recent diagnosis of right breast DCIS who is here to discuss the treatment plan.  She had a routine screening mammogram that detected bilateral breast calcifications.  She underwent multiple biopsies which revealed a right-sided DCIS and left side invasive ductal carcinoma.  She had seen Dr. [name] and will undergo lumpectomy next week.  She has been sent to [place] for discussion regarding adjuvant treatment options.  She has an appoint with radiation oncology after surgery is complete. I reviewed her records extensively and collaborated the history with the patient.  SUMMARY OF ONCOLOGIC HISTORY:   Ductal carcinoma in situ (DCIS) of right breast   01/25/2019 Initial Diagnosis    Screening mammogram showed bilateral calcifications right breast 5 mm, pleomorphic.  Left breast 1.6 cm punctate, smaller group of calcifications spanning 3 mm.  Right lower outer quadrant biopsy revealed low-grade DCIS ER 90%, PR 70%; biopsy of left breast calcifications ALH and PASH    01/31/2019 Genetic Testing    ATM c.8495G>A and RECQL4 c.2587G>A VUS identified on the 9-gene STAT panel and Multi-cancer panel through [facility].  The STAT Breast cancer panel offered by [facility] includes sequencing and rearrangement analysis for the following 9 genes:  ATM, BRCA1, BRCA2, CDH1, CHEK2, PALB2, PTEN, STK11 and TP53.   The Multi-Gene Panel offered by [facility] includes sequencing and/or deletion duplication testing of the following 84 genes: AIP, ALK, APC, ATM, AXIN2,BAP1,  BARD1, BLM, BMPR1A, BRCA1, BRCA2, BRIP1, CASR, CDC73, CDH1, CDK4, CDKN1B, CDKN1C, CDKN2A (p14ARF), CDKN2A (p16INK4a), CEBPA, CHEK2, CTNNA1, DICER1, DIS3L2, EGFR (c.2369C>T, p.Thr790Met variant only),  EPCAM (Deletion/duplication testing only), FH, FLCN, GATA2, GPC3, GREM1 (Promoter region deletion/duplication testing only), HOXB13 (c.251G>A, p.Gly84Glu), HRAS, KIT, MAX, MEN1, MET, MITF (c.952G>A, p.Glu318Lys variant only), MLH1, MSH2, MSH3, MSH6, MUTYH, NBN, NF1, NF2, NTHL1, PALB2, PDGFRA, PHOX2B, PMS2, POLD1, POLE, POT1, PRKAR1A, PTCH1, PTEN, RAD50, RAD51C, RAD51D, RB1, RECQL4, RET, RUNX1, SDHAF2, SDHA (sequence changes only), SDHB, SDHC, SDHD, SMAD4, SMARCA4, SMARCB1, SMARCE1, STK11, SUFU, TERC, TERT, TMEM127, TP53, TSC1, TSC2, VHL, WRN and WT1.  he report date is February 02, 2019.     AML (acute myeloid leukemia) ([place])   09/1998 Initial Diagnosis    AML (acute myeloid leukemia) ([place]) treated with 2 induction regimens followed by 6 consolidation regimens, remission      MEDICAL HISTORY:  Past Medical History:  Diagnosis Date  . Blood transfusion without reported diagnosis    leaukemia 18 years ago  . Breast cancer ([place])   . Cholestasis during pregnancy   . Endometriosis   . Family history of colon cancer   . Family history of leukemia   . Fibroid   . Leukemia ([place])     SURGICAL HISTORY: Past Surgical History:  Procedure Laterality Date  . NO PAST SURGERIES      SOCIAL HISTORY: Social History   Socioeconomic History  . Marital status: Single    Spouse name: Not on file  . Number of children: Not on file  . Years of education: Not on file  . Highest education level: Not on file  Occupational History  . Not on file  Social Needs  . Financial resource strain: Not on file  . Food insecurity:    Worry: Not on file    Inability: Not on file  .  Transportation needs:    Medical: Not on file    Non-medical: Not on file  Tobacco Use  . Smoking status: Former Smoker    Years: 10.00    Types: Cigarettes    Last attempt to quit: 01/25/2015    Years since quitting: 4.0  . Smokeless tobacco: Never Used  Substance and Sexual Activity  . Alcohol use: No  . Drug use: No  .  Sexual activity: Yes    Birth control/protection: None  Lifestyle  . Physical activity:    Days per week: Not on file    Minutes per session: Not on file  . Stress: Not on file  Relationships  . Social connections:    Talks on phone: Not on file    Gets together: Not on file    Attends religious service: Not on file    Active member of club or organization: Not on file    Attends meetings of clubs or organizations: Not on file    Relationship status: Not on file  . Intimate partner violence:    Fear of current or ex partner: Not on file    Emotionally abused: Not on file    Physically abused: Not on file    Forced sexual activity: Not on file  Other Topics Concern  . Not on file  Social History Narrative  . Not on file    FAMILY HISTORY: Family History  Problem Relation Age of Onset  . Colon cancer Mother 79       d. 67  . Hypertension Father   . Diabetes Maternal Aunt   . Diabetes Maternal Uncle   . Mental retardation Maternal Uncle   . Diabetes Paternal Aunt   . Diabetes Maternal Grandmother   . Stroke Paternal Grandmother        c. 59  . Leukemia Paternal Grandfather        d. 43; ?CLL  . Leukemia Cousin 4       mat first cousin  . Lymphoma Cousin 67       pat first cousin    ALLERGIES:  has No Known Allergies.  MEDICATIONS:  Current Outpatient Medications  Medication Sig Dispense Refill  . ibuprofen (ADVIL,MOTRIN) 600 MG tablet Take 1 tablet (600 mg total) by mouth every 6 (six) hours. 30 tablet 1   No current facility-administered medications for this visit.     REVIEW OF SYSTEMS:   Constitutional: Denies fevers, chills or abnormal night sweats Eyes: Denies blurriness of vision, double vision or watery eyes Ears, nose, mouth, throat, and face: Denies mucositis or sore throat Respiratory: Denies cough, dyspnea or wheezes Cardiovascular: Denies palpitation, chest discomfort or lower extremity swelling Gastrointestinal:  Denies nausea, heartburn or  change in bowel habits Skin: Denies abnormal skin rashes Lymphatics: Denies new lymphadenopathy or easy bruising Neurological:Denies numbness, tingling or new weaknesses Behavioral/Psych: Mood is stable, no new changes  Breast:  Denies any palpable lumps or discharge All other systems were reviewed with the patient and are negative.  PHYSICAL EXAMINATION: ECOG PERFORMANCE STATUS: 1 - Symptomatic but completely ambulatory  Vitals:   02/08/19 1254  BP: 137/87  Pulse: 73  Resp: 18  Temp: 98.3 F (36.8 C)  SpO2: 100%   Filed Weights   02/08/19 1254  Weight: 153 lb 9.6 oz (69.7 kg)    GENERAL:alert, no distress and comfortable SKIN: skin color, texture, turgor are normal, no rashes or significant lesions EYES: normal, conjunctiva are pink and non-injected, sclera clear OROPHARYNX:no exudate,  no erythema and lips, buccal mucosa, and tongue normal  NECK: supple, thyroid normal size, non-tender, without nodularity LYMPH:  no palpable lymphadenopathy in the cervical, axillary or inguinal LUNGS: clear to auscultation and percussion with normal breathing effort HEART: regular rate & rhythm and no murmurs and no lower extremity edema ABDOMEN:abdomen soft, non-tender and normal bowel sounds Musculoskeletal:no cyanosis of digits and no clubbing  PSYCH: alert & oriented x 3 with fluent speech NEURO: no focal motor/sensory deficits BREAST: No palpable nodules in breast. No palpable axillary or supraclavicular lymphadenopathy (exam performed in the presence of a chaperone)   LABORATORY DATA:  I have reviewed the data as listed Lab Results  Component Value Date   WBC 15.3 (H) 03/26/2017   HGB 11.9 (L) 03/26/2017   HCT 35.8 (L) 03/26/2017   MCV 93.2 03/26/2017   PLT 186 03/26/2017   Lab Results  Component Value Date   NA 136 03/01/2017   K 3.7 03/01/2017   CL 108 03/01/2017   CO2 20 (L) 03/01/2017    RADIOGRAPHIC STUDIES: I have personally reviewed the radiological reports and  agreed with the findings in the report.  ASSESSMENT AND PLAN:  Ductal carcinoma in situ (DCIS) of right breast 01/08/2019 and 01/14/2019:Screening mammogram showed bilateral calcifications right breast 5 mm, pleomorphic.  Left breast 1.6 cm punctate, smaller group of calcifications spanning 3 mm.  Right lower outer quadrant biopsy revealed low-grade DCIS ER 90%, PR 70%; biopsy of left breast calcifications ALH and PASH  Pathology review: I discussed with the patient the difference between DCIS and invasive breast cancer. It is considered a precancerous lesion. DCIS is classified as a 0. It is generally detected through mammograms as calcifications. We discussed the significance of grades and its impact on prognosis. We also discussed the importance of ER and PR receptors and their implications to adjuvant treatment options. Prognosis of DCIS dependence on grade, comedo necrosis. It is anticipated that if not treated, 20-30% of DCIS can develop into invasive breast cancer.  Recommendation: 1. Breast conserving surgery 2. Followed by adjuvant radiation therapy 3. Followed by antiestrogen therapy with tamoxifen 5 years  Tamoxifen counseling: We discussed the risks and benefits of tamoxifen. These include but not limited to insomnia, hot flashes, mood changes, vaginal dryness, and weight gain. Although rare, serious side effects including endometrial cancer, risk of blood clots were also discussed. We strongly believe that the benefits far outweigh the risks. Patient understands these risks and consented to starting treatment. Planned treatment duration is 5 years.  Her surgery has been planned for next Thursday.   History of AML: In remission Return to clinic after surgery to discuss the final pathology report and come up with an adjuvant treatment plan.   All questions were answered. The patient knows to call the clinic with any problems, questions or concerns.    Harriette Ohara,  MD 02/08/19

## 2019-02-08 NOTE — Assessment & Plan Note (Signed)
01/08/2019 and 01/14/2019:Screening mammogram showed bilateral calcifications right breast 5 mm, pleomorphic.  Left breast 1.6 cm punctate, smaller group of calcifications spanning 3 mm.  Right lower outer quadrant biopsy revealed low-grade DCIS ER 90%, PR 70%; biopsy of left breast calcifications ALH and PASH  Pathology review: I discussed with the patient the difference between DCIS and invasive breast cancer. It is considered a precancerous lesion. DCIS is classified as a 0. It is generally detected through mammograms as calcifications. We discussed the significance of grades and its impact on prognosis. We also discussed the importance of ER and PR receptors and their implications to adjuvant treatment options. Prognosis of DCIS dependence on grade, comedo necrosis. It is anticipated that if not treated, 20-30% of DCIS can develop into invasive breast cancer.  Recommendation: 1. Breast conserving surgery 2. Followed by adjuvant radiation therapy 3. Followed by antiestrogen therapy with tamoxifen 5 years  Tamoxifen counseling: We discussed the risks and benefits of tamoxifen. These include but not limited to insomnia, hot flashes, mood changes, vaginal dryness, and weight gain. Although rare, serious side effects including endometrial cancer, risk of blood clots were also discussed. We strongly believe that the benefits far outweigh the risks. Patient understands these risks and consented to starting treatment. Planned treatment duration is 5 years.  We also discussed participation in [facility] clinical trial AFT 25 COMET Phase 3 clinical trial for low risk DCIS grade 1/2 PR positive, age greater than 12 randomized to surgery +/- radiation, +/- endocrine therapy versus active surveillance with +/- endocrine therapy surveillance with mammograms every 6 months for 5 years;patient's have option to decline elevated arm and still be followed on study   Return to clinic after surgery to discuss the final  pathology report and come up with an adjuvant treatment plan.

## 2019-02-08 NOTE — Telephone Encounter (Signed)
Gave avs and calendar ° °

## 2019-02-09 ENCOUNTER — Encounter: Payer: Self-pay | Admitting: *Deleted

## 2019-02-10 ENCOUNTER — Ambulatory Visit
Admission: RE | Admit: 2019-02-10 | Discharge: 2019-02-10 | Disposition: A | Discharge status: Home / Self Care | Payer: BLUE CROSS/BLUE SHIELD | Source: Ambulatory Visit | Attending: Surgery | Admitting: Surgery

## 2019-02-10 ENCOUNTER — Other Ambulatory Visit: Payer: Self-pay

## 2019-02-10 ENCOUNTER — Encounter (HOSPITAL_BASED_OUTPATIENT_CLINIC_OR_DEPARTMENT_OTHER)
Admission: RE | Admit: 2019-02-10 | Discharge: 2019-02-10 | Disposition: A | Discharge status: Home / Self Care | Payer: BLUE CROSS/BLUE SHIELD | Source: Ambulatory Visit | Attending: Surgery | Admitting: Surgery

## 2019-02-10 DIAGNOSIS — Z01812 Encounter for preprocedural laboratory examination: Secondary | ICD-10-CM | POA: Diagnosis not present

## 2019-02-10 DIAGNOSIS — D0511 Intraductal carcinoma in situ of right breast: Secondary | ICD-10-CM

## 2019-02-10 DIAGNOSIS — N6092 Unspecified benign mammary dysplasia of left breast: Secondary | ICD-10-CM

## 2019-02-10 DIAGNOSIS — N6091 Unspecified benign mammary dysplasia of right breast: Secondary | ICD-10-CM | POA: Diagnosis not present

## 2019-02-10 LAB — POCT PREGNANCY, URINE: Preg Test, Ur: NEGATIVE

## 2019-02-10 NOTE — Progress Notes (Signed)
Ensure pre surgery drink given with instructions to complete by 0600 dos, surgical soap given with instructions.

## 2019-02-11 ENCOUNTER — Ambulatory Visit (HOSPITAL_BASED_OUTPATIENT_CLINIC_OR_DEPARTMENT_OTHER): Payer: BLUE CROSS/BLUE SHIELD | Admitting: Anesthesiology

## 2019-02-11 ENCOUNTER — Ambulatory Visit (HOSPITAL_BASED_OUTPATIENT_CLINIC_OR_DEPARTMENT_OTHER)
Admission: RE | Admit: 2019-02-11 | Discharge: 2019-02-11 | Disposition: A | Discharge status: Home / Self Care | Payer: BLUE CROSS/BLUE SHIELD | Attending: Surgery | Admitting: Surgery

## 2019-02-11 ENCOUNTER — Encounter (HOSPITAL_BASED_OUTPATIENT_CLINIC_OR_DEPARTMENT_OTHER): Payer: Self-pay | Admitting: Anesthesiology

## 2019-02-11 ENCOUNTER — Ambulatory Visit
Admission: RE | Admit: 2019-02-11 | Discharge: 2019-02-11 | Disposition: A | Discharge status: Home / Self Care | Payer: BLUE CROSS/BLUE SHIELD | Source: Ambulatory Visit | Attending: Surgery | Admitting: Surgery

## 2019-02-11 ENCOUNTER — Encounter (HOSPITAL_BASED_OUTPATIENT_CLINIC_OR_DEPARTMENT_OTHER)
Admission: RE | Disposition: A | Discharge status: Home / Self Care | Payer: Self-pay | Source: Home / Self Care | Attending: Surgery

## 2019-02-11 ENCOUNTER — Other Ambulatory Visit: Payer: Self-pay

## 2019-02-11 DIAGNOSIS — Z87891 Personal history of nicotine dependence: Secondary | ICD-10-CM | POA: Diagnosis not present

## 2019-02-11 DIAGNOSIS — N6092 Unspecified benign mammary dysplasia of left breast: Secondary | ICD-10-CM

## 2019-02-11 DIAGNOSIS — N644 Mastodynia: Secondary | ICD-10-CM | POA: Diagnosis not present

## 2019-02-11 DIAGNOSIS — D0511 Intraductal carcinoma in situ of right breast: Secondary | ICD-10-CM | POA: Insufficient documentation

## 2019-02-11 DIAGNOSIS — Z9889 Other specified postprocedural states: Secondary | ICD-10-CM | POA: Insufficient documentation

## 2019-02-11 DIAGNOSIS — Z856 Personal history of leukemia: Secondary | ICD-10-CM | POA: Diagnosis not present

## 2019-02-11 DIAGNOSIS — Z86010 Personal history of colonic polyps: Secondary | ICD-10-CM | POA: Insufficient documentation

## 2019-02-11 DIAGNOSIS — N6091 Unspecified benign mammary dysplasia of right breast: Secondary | ICD-10-CM | POA: Diagnosis not present

## 2019-02-11 DIAGNOSIS — D0502 Lobular carcinoma in situ of left breast: Secondary | ICD-10-CM | POA: Diagnosis not present

## 2019-02-11 DIAGNOSIS — Z800 Family history of malignant neoplasm of digestive organs: Secondary | ICD-10-CM | POA: Insufficient documentation

## 2019-02-11 DIAGNOSIS — Z170 Estrogen receptor positive status [ER+]: Secondary | ICD-10-CM | POA: Diagnosis not present

## 2019-02-11 DIAGNOSIS — N6022 Fibroadenosis of left breast: Secondary | ICD-10-CM | POA: Insufficient documentation

## 2019-02-11 DIAGNOSIS — N6012 Diffuse cystic mastopathy of left breast: Secondary | ICD-10-CM | POA: Diagnosis not present

## 2019-02-11 HISTORY — DX: Nausea with vomiting, unspecified: R11.2

## 2019-02-11 HISTORY — PX: BREAST LUMPECTOMY WITH RADIOACTIVE SEED LOCALIZATION: SHX6424

## 2019-02-11 HISTORY — DX: Nausea with vomiting, unspecified: Z98.890

## 2019-02-11 HISTORY — PX: BREAST LUMPECTOMY: SHX2

## 2019-02-11 HISTORY — PX: BREAST EXCISIONAL BIOPSY: SUR124

## 2019-02-11 SURGERY — BREAST LUMPECTOMY WITH RADIOACTIVE SEED LOCALIZATION
Anesthesia: General | Site: Breast | Laterality: Bilateral

## 2019-02-11 MED ORDER — DEXAMETHASONE SODIUM PHOSPHATE 10 MG/ML IJ SOLN
INTRAMUSCULAR | Status: AC
  Filled 2019-02-11: qty 1

## 2019-02-11 MED ORDER — DEXAMETHASONE SODIUM PHOSPHATE 4 MG/ML IJ SOLN
INTRAMUSCULAR | Status: DC | PRN
  Administered 2019-02-11: 10 mg via INTRAVENOUS

## 2019-02-11 MED ORDER — MIDAZOLAM HCL 2 MG/2ML IJ SOLN
1.0000 mg | INTRAMUSCULAR | Status: DC | PRN
  Administered 2019-02-11: 2 mg via INTRAVENOUS

## 2019-02-11 MED ORDER — CELECOXIB 200 MG PO CAPS
ORAL_CAPSULE | ORAL | Status: AC
  Filled 2019-02-11: qty 1

## 2019-02-11 MED ORDER — FENTANYL CITRATE (PF) 100 MCG/2ML IJ SOLN
50.0000 ug | INTRAMUSCULAR | Status: DC | PRN
  Administered 2019-02-11: 100 ug via INTRAVENOUS

## 2019-02-11 MED ORDER — HYDROMORPHONE HCL 1 MG/ML IJ SOLN
INTRAMUSCULAR | Status: AC
  Filled 2019-02-11: qty 0.5

## 2019-02-11 MED ORDER — PROMETHAZINE HCL 25 MG/ML IJ SOLN
INTRAMUSCULAR | Status: AC
  Filled 2019-02-11: qty 1

## 2019-02-11 MED ORDER — LACTATED RINGERS IV SOLN
INTRAVENOUS | Status: DC
  Administered 2019-02-11 (×2): via INTRAVENOUS

## 2019-02-11 MED ORDER — OXYCODONE HCL 5 MG/5ML PO SOLN: 5.0000 mg | Freq: Once | ORAL | Status: DC | PRN

## 2019-02-11 MED ORDER — ACETAMINOPHEN 500 MG PO TABS
1000.0000 mg | ORAL_TABLET | ORAL | Status: AC
  Administered 2019-02-11: 1000 mg via ORAL

## 2019-02-11 MED ORDER — PROMETHAZINE HCL 25 MG/ML IJ SOLN
6.2500 mg | INTRAMUSCULAR | Status: DC | PRN
  Administered 2019-02-11: 6.25 mg via INTRAVENOUS

## 2019-02-11 MED ORDER — ONDANSETRON HCL 4 MG/2ML IJ SOLN
INTRAMUSCULAR | Status: AC
  Filled 2019-02-11: qty 2

## 2019-02-11 MED ORDER — CHLORHEXIDINE GLUCONATE CLOTH 2 % EX PADS: 6.0000 | MEDICATED_PAD | Freq: Once | CUTANEOUS | Status: DC

## 2019-02-11 MED ORDER — MIDAZOLAM HCL 2 MG/2ML IJ SOLN
INTRAMUSCULAR | Status: AC
  Filled 2019-02-11: qty 2

## 2019-02-11 MED ORDER — FENTANYL CITRATE (PF) 100 MCG/2ML IJ SOLN
INTRAMUSCULAR | Status: AC
  Filled 2019-02-11: qty 2

## 2019-02-11 MED ORDER — LIDOCAINE 2% (20 MG/ML) 5 ML SYRINGE
INTRAMUSCULAR | Status: DC | PRN
  Administered 2019-02-11: 80 mg via INTRAVENOUS

## 2019-02-11 MED ORDER — BUPIVACAINE-EPINEPHRINE 0.25% -1:200000 IJ SOLN
INTRAMUSCULAR | Status: DC | PRN
  Administered 2019-02-11: 20 mL

## 2019-02-11 MED ORDER — PHENYLEPHRINE 40 MCG/ML (10ML) SYRINGE FOR IV PUSH (FOR BLOOD PRESSURE SUPPORT)
PREFILLED_SYRINGE | INTRAVENOUS | Status: AC
  Filled 2019-02-11: qty 10

## 2019-02-11 MED ORDER — GABAPENTIN 300 MG PO CAPS
ORAL_CAPSULE | ORAL | Status: AC
  Filled 2019-02-11: qty 1

## 2019-02-11 MED ORDER — FAMOTIDINE 20 MG PO TABS
20.0000 mg | ORAL_TABLET | Freq: Once | ORAL | Status: AC
  Administered 2019-02-11: 20 mg via ORAL

## 2019-02-11 MED ORDER — LIDOCAINE 2% (20 MG/ML) 5 ML SYRINGE
INTRAMUSCULAR | Status: AC
  Filled 2019-02-11: qty 5

## 2019-02-11 MED ORDER — OXYCODONE HCL 5 MG PO TABS: 5.0000 mg | ORAL_TABLET | Freq: Once | ORAL | Status: DC | PRN

## 2019-02-11 MED ORDER — ACETAMINOPHEN 500 MG PO TABS
ORAL_TABLET | ORAL | Status: AC
  Filled 2019-02-11: qty 2

## 2019-02-11 MED ORDER — CEFAZOLIN SODIUM-DEXTROSE 2-4 GM/100ML-% IV SOLN
INTRAVENOUS | Status: AC
  Filled 2019-02-11: qty 100

## 2019-02-11 MED ORDER — HYDROCODONE-ACETAMINOPHEN 5-325 MG PO TABS: 1.0000 | ORAL_TABLET | Freq: Four times a day (QID) | ORAL | 0 refills | Status: DC | PRN

## 2019-02-11 MED ORDER — HYDROMORPHONE HCL 1 MG/ML IJ SOLN
0.2500 mg | INTRAMUSCULAR | Status: DC | PRN
  Administered 2019-02-11: 0.5 mg via INTRAVENOUS

## 2019-02-11 MED ORDER — FAMOTIDINE 20 MG PO TABS
ORAL_TABLET | ORAL | Status: AC
  Filled 2019-02-11: qty 1

## 2019-02-11 MED ORDER — EPHEDRINE 5 MG/ML INJ
INTRAVENOUS | Status: AC
  Filled 2019-02-11: qty 10

## 2019-02-11 MED ORDER — SODIUM CHLORIDE 0.9 % IV SOLN
INTRAVENOUS | Status: DC | PRN
  Administered 2019-02-11: 40 ug via INTRAVENOUS

## 2019-02-11 MED ORDER — ONDANSETRON HCL 4 MG/2ML IJ SOLN
INTRAMUSCULAR | Status: DC | PRN
  Administered 2019-02-11: 4 mg via INTRAVENOUS

## 2019-02-11 MED ORDER — EPHEDRINE SULFATE 50 MG/ML IJ SOLN
INTRAMUSCULAR | Status: DC | PRN
  Administered 2019-02-11: 10 mg via INTRAVENOUS
  Administered 2019-02-11: 15 mg via INTRAVENOUS

## 2019-02-11 MED ORDER — CEFAZOLIN SODIUM-DEXTROSE 2-4 GM/100ML-% IV SOLN
2.0000 g | INTRAVENOUS | Status: AC
  Administered 2019-02-11: 2 g via INTRAVENOUS

## 2019-02-11 MED ORDER — SCOPOLAMINE 1 MG/3DAYS TD PT72: 1.0000 | MEDICATED_PATCH | Freq: Once | TRANSDERMAL | Status: DC | PRN

## 2019-02-11 MED ORDER — GABAPENTIN 300 MG PO CAPS
300.0000 mg | ORAL_CAPSULE | ORAL | Status: AC
  Administered 2019-02-11: 300 mg via ORAL

## 2019-02-11 MED ORDER — PROPOFOL 10 MG/ML IV BOLUS
INTRAVENOUS | Status: DC | PRN
  Administered 2019-02-11: 200 mg via INTRAVENOUS

## 2019-02-11 MED ORDER — SODIUM CHLORIDE (PF) 0.9 % IJ SOLN
INTRAMUSCULAR | Status: AC
  Filled 2019-02-11: qty 10

## 2019-02-11 MED ORDER — CELECOXIB 200 MG PO CAPS
200.0000 mg | ORAL_CAPSULE | ORAL | Status: AC
  Administered 2019-02-11: 200 mg via ORAL

## 2019-02-11 SURGICAL SUPPLY — 51 items
APPLIER CLIP 9.375 MED OPEN (MISCELLANEOUS) ×2
BENZOIN TINCTURE PRP APPL 2/3 (GAUZE/BANDAGES/DRESSINGS) ×2 IMPLANT
BLADE HEX COATED 2.75 (ELECTRODE) ×2 IMPLANT
BLADE SURG 15 STRL LF DISP TIS (BLADE) ×1 IMPLANT
BLADE SURG 15 STRL SS (BLADE) ×1
CANISTER SUC SOCK COL 7IN (MISCELLANEOUS) IMPLANT
CANISTER SUCT 1200ML W/VALVE (MISCELLANEOUS) ×2 IMPLANT
CHLORAPREP W/TINT 26 (MISCELLANEOUS) ×2 IMPLANT
CLIP APPLIE 9.375 MED OPEN (MISCELLANEOUS) ×1 IMPLANT
COVER BACK TABLE 60X90IN (DRAPES) ×2 IMPLANT
COVER MAYO STAND STRL (DRAPES) ×2 IMPLANT
COVER PROBE W GEL 5X96 (DRAPES) ×2 IMPLANT
COVER WAND RF STERILE (DRAPES) IMPLANT
DECANTER SPIKE VIAL GLASS SM (MISCELLANEOUS) IMPLANT
DRAPE LAPAROTOMY 100X72 PEDS (DRAPES) ×2 IMPLANT
DRAPE UTILITY XL STRL (DRAPES) ×2 IMPLANT
DRSG TEGADERM 4X4.75 (GAUZE/BANDAGES/DRESSINGS) ×4 IMPLANT
ELECT BLADE 4.0 EZ CLEAN MEGAD (MISCELLANEOUS) ×4
ELECT REM PT RETURN 9FT ADLT (ELECTROSURGICAL) ×2
ELECTRODE BLDE 4.0 EZ CLN MEGD (MISCELLANEOUS) ×2 IMPLANT
ELECTRODE REM PT RTRN 9FT ADLT (ELECTROSURGICAL) ×1 IMPLANT
GAUZE SPONGE 4X4 12PLY STRL LF (GAUZE/BANDAGES/DRESSINGS) ×2 IMPLANT
GLOVE BIO SURGEON STRL SZ 6.5 (GLOVE) ×2 IMPLANT
GLOVE BIO SURGEON STRL SZ7 (GLOVE) ×2 IMPLANT
GLOVE BIOGEL PI IND STRL 7.5 (GLOVE) ×1 IMPLANT
GLOVE BIOGEL PI INDICATOR 7.5 (GLOVE) ×1
GLOVE EXAM NITRILE MD LF STRL (GLOVE) ×2 IMPLANT
GOWN STRL REUS W/ TWL LRG LVL3 (GOWN DISPOSABLE) ×2 IMPLANT
GOWN STRL REUS W/TWL LRG LVL3 (GOWN DISPOSABLE) ×2
ILLUMINATOR WAVEGUIDE N/F (MISCELLANEOUS) IMPLANT
KIT MARKER MARGIN INK (KITS) ×2 IMPLANT
LIGHT WAVEGUIDE WIDE FLAT (MISCELLANEOUS) IMPLANT
NEEDLE HYPO 25X1 1.5 SAFETY (NEEDLE) ×2 IMPLANT
NS IRRIG 1000ML POUR BTL (IV SOLUTION) ×2 IMPLANT
PACK BASIN DAY SURGERY FS (CUSTOM PROCEDURE TRAY) ×2 IMPLANT
PENCIL BUTTON HOLSTER BLD 10FT (ELECTRODE) ×4 IMPLANT
SLEEVE SCD COMPRESS KNEE MED (MISCELLANEOUS) ×2 IMPLANT
SPONGE GAUZE 2X2 8PLY STRL LF (GAUZE/BANDAGES/DRESSINGS) IMPLANT
SPONGE LAP 18X18 RF (DISPOSABLE) IMPLANT
SPONGE LAP 4X18 RFD (DISPOSABLE) ×4 IMPLANT
STRIP CLOSURE SKIN 1/2X4 (GAUZE/BANDAGES/DRESSINGS) ×2 IMPLANT
SUT MON AB 4-0 PC3 18 (SUTURE) ×4 IMPLANT
SUT SILK 2 0 SH (SUTURE) IMPLANT
SUT VIC AB 3-0 SH 27 (SUTURE) ×2
SUT VIC AB 3-0 SH 27X BRD (SUTURE) ×2 IMPLANT
SYR BULB 3OZ (MISCELLANEOUS) ×2 IMPLANT
SYR CONTROL 10ML LL (SYRINGE) ×2 IMPLANT
TOWEL GREEN STERILE FF (TOWEL DISPOSABLE) ×2 IMPLANT
TRAY FAXITRON CT DISP (TRAY / TRAY PROCEDURE) ×4 IMPLANT
TUBE CONNECTING 20X1/4 (TUBING) ×2 IMPLANT
YANKAUER SUCT BULB TIP NO VENT (SUCTIONS) ×2 IMPLANT

## 2019-02-11 NOTE — Anesthesia Postprocedure Evaluation (Signed)
Anesthesia Post Note  Patient: Dietitian  Procedure(s) Performed: BILATERAL BREAST LUMPECTOMIES WITH BILATERAL RADIOACTIVE SEED LOCALIZATION (Bilateral Breast)     Patient location during evaluation: PACU Anesthesia Type: General Level of consciousness: awake and alert Pain management: pain level controlled Vital Signs Assessment: post-procedure vital signs reviewed and stable Respiratory status: spontaneous breathing, nonlabored ventilation, respiratory function stable and patient connected to nasal cannula oxygen Cardiovascular status: blood pressure returned to baseline and stable Postop Assessment: no apparent nausea or vomiting Anesthetic complications: no    Last Vitals:  Vitals:   02/11/19 1200 02/11/19 1220  BP: 125/86 131/85  Pulse: 94 96  Resp: 13 16  Temp:  36.5 C  SpO2: 98% 100%    Last Pain:  Vitals:   02/11/19 1220  TempSrc:   PainSc: 2                  Nalayah Hitt P Jairen Goldfarb

## 2019-02-11 NOTE — Interval H&P Note (Signed)
History and Physical Interval Note:  02/11/2019 9:01 AM  Joann Meadows  has presented today for surgery, with the diagnosis of RIGHT BREAST DCIS, LEFT BREAST ALH.  The various methods of treatment have been discussed with the patient and family. After consideration of risks, benefits and other options for treatment, the patient has consented to  Procedure(s): BILATERAL BREAST LUMPECTOMIES WITH BILATERAL RADIOACTIVE SEED LOCALIZATION (Bilateral) as a surgical intervention.  The patient's history has been reviewed, patient examined, no change in status, stable for surgery.  I have reviewed the patient's chart and labs.  Questions were answered to the patient's satisfaction.     Maia Petties

## 2019-02-11 NOTE — Transfer of Care (Signed)
Immediate Anesthesia Transfer of Care Note  Patient: Joann Meadows  Procedure(s) Performed: BILATERAL BREAST LUMPECTOMIES WITH BILATERAL RADIOACTIVE SEED LOCALIZATION (Bilateral Breast)  Patient Location: PACU  Anesthesia Type:General  Level of Consciousness: awake and sedated  Airway & Oxygen Therapy: Patient Spontanous Breathing and Patient connected to face mask oxygen  Post-op Assessment: Report given to RN and Post -op Vital signs reviewed and stable  Post vital signs: Reviewed and stable  Last Vitals:  Vitals Value Taken Time  BP 121/68 02/11/2019 10:56 AM  Temp    Pulse 108 02/11/2019 11:00 AM  Resp 12 02/11/2019 11:00 AM  SpO2 100 % 02/11/2019 11:00 AM  Vitals shown include unvalidated device data.  Last Pain:  Vitals:   02/11/19 0800  TempSrc: Oral  PainSc: 0-No pain         Complications: No apparent anesthesia complications

## 2019-02-11 NOTE — Anesthesia Procedure Notes (Signed)
Procedure Name: LMA Insertion Performed by: Khya Halls W, CRNA Pre-anesthesia Checklist: Patient identified, Emergency Drugs available, Suction available and Patient being monitored Patient Re-evaluated:Patient Re-evaluated prior to induction Oxygen Delivery Method: Circle system utilized Preoxygenation: Pre-oxygenation with 100% oxygen Induction Type: IV induction Ventilation: Mask ventilation without difficulty LMA: LMA inserted LMA Size: 4.0 Number of attempts: 1 Placement Confirmation: positive ETCO2 Tube secured with: Tape Dental Injury: Teeth and Oropharynx as per pre-operative assessment        

## 2019-02-11 NOTE — Anesthesia Preprocedure Evaluation (Addendum)
Anesthesia Evaluation  Patient identified by MRN, date of birth, ID band Patient awake    Reviewed: Allergy & Precautions, NPO status , Patient's Chart, lab work & pertinent test results  History of Anesthesia Complications (+) PONV and history of anesthetic complications  Airway Mallampati: II  TM Distance: >3 FB Neck ROM: Full    Dental no notable dental hx.    Pulmonary former smoker,    Pulmonary exam normal breath sounds clear to auscultation       Cardiovascular negative cardio ROS Normal cardiovascular exam Rhythm:Regular Rate:Normal     Neuro/Psych negative neurological ROS  negative psych ROS   GI/Hepatic negative GI ROS, Neg liver ROS,   Endo/Other  Leukemia   Renal/GU negative Renal ROS     Musculoskeletal negative musculoskeletal ROS (+)   Abdominal   Peds  Hematology negative hematology ROS (+)   Anesthesia Other Findings RIGHT BREAST DCIS, LEFT BREAST ALH  Reproductive/Obstetrics hcg negative                            Anesthesia Physical Anesthesia Plan  ASA: II  Anesthesia Plan: General   Post-op Pain Management:    Induction: Intravenous  PONV Risk Score and Plan: 3 and Midazolam, Dexamethasone, Ondansetron and Treatment may vary due to age or medical condition  Airway Management Planned: LMA  Additional Equipment:   Intra-op Plan:   Post-operative Plan: Extubation in OR  Informed Consent: I have reviewed the patients History and Physical, chart, labs and discussed the procedure including the risks, benefits and alternatives for the proposed anesthesia with the patient or authorized representative who has indicated his/her understanding and acceptance.     Dental advisory given  Plan Discussed with: CRNA  Anesthesia Plan Comments:         Anesthesia Quick Evaluation

## 2019-02-11 NOTE — Op Note (Signed)
Pre-op Diagnosis:  Right breast DCIS/ left breast ALH Post-op Diagnosis: same Procedure:  Bilateral radioactive seed localized lumpectomies Surgeon:  Shareese Macha K. Anesthesia:  GEN - LMA Indications:  This is a healthy 43 year old [place] female with a history of acute myelogenous leukemia 21 years ago who presents after a recent routine screening mammogram. In the lateral inferior right breast there is a 5 mm area of pleomorphic calcifications. The upper outer left breast showed a loosely grouped 16 mm area of calcifications. The left lateral inferior breast showed a 3 mm area of round punctate calcifications. Initially she underwent core biopsy of the right breast calcifications. This returned a diagnosis of DCIS with lobular neoplasia, ER/PR positive. Subsequently she underwent biopsy of the 2 areas in the left breast. The loosely grouped 16 mm area of calcifications revealed a diagnosis of PASH. The lower Outer quadrant area of calcifications showed atypical lobular hyperplasia. Marland Kitchen  No previous breast complaints AML treated at age 67. No residual problems.  Path - right breast - DCIS with lobular neoplasia, ER/ PR + Left breast - lower outer quadrant 3 mm calcs - atypical lobular hyperplasia Left breast - upper outer quadrant 16 mm calcs - PASH   Description of procedure: The patient is brought to the operating room placed in supine position on the operating room table. After an adequate level of general anesthesia was obtained, her chest was prepped with ChloraPrep and draped in sterile fashion. A timeout was taken to ensure the proper patient and proper procedure. We interrogated the left breast with the neoprobe. We made a inframammary incision closest to the seed after infiltrating with 0.25% Marcaine. Dissection was carried down in the breast tissue with cautery. We used the neoprobe to guide [place] towards the radioactive seed. We excised an area of tissue around the radioactive  seed 2.5 cm in diameter. The specimen was removed and was oriented with a paint kit. Specimen mammogram showed the radioactive seed as well as the biopsy clip within the specimen. This was sent for pathologic examination. There is no residual radioactivity within the biopsy cavity. We inspected carefully for hemostasis. The wound was thoroughly irrigated. The wound was closed with a deep layer of 3-0 Vicryl and a subcuticular layer of 4-0 Monocryl.   We then turned our attention to the right side.  We interrogated the right breast with the neoprobe. We made a lateral inframammary incision after infiltrating with 0.25% Marcaine. Dissection was carried down in the breast tissue with cautery. We used the neoprobe to guide [place] towards the radioactive seed. We excised an area of tissue around the radioactive seed 2.5 cm in diameter. The specimen was removed and was oriented with a paint kit. Specimen mammogram showed the radioactive seed as well as the biopsy clip within the specimen. This was sent for pathologic examination. There is no residual radioactivity within the biopsy cavity. We inspected carefully for hemostasis. The wound was thoroughly irrigated. Marking clips were placed to delineate the margins.  The wound was closed with a deep layer of 3-0 Vicryl and a subcuticular layer of 4-0 Monocryl.  Benzoin and Steri-Strips were applied. The patient was then extubated and brought to the recovery room in stable condition. All sponge, instrument, and needle counts are correct.  Imogene Burn. [name], MD, [facility]  General/ Trauma Surgery  02/11/2019 10:49 AM

## 2019-02-11 NOTE — Discharge Instructions (Signed)
Post Anesthesia Home Care Instructions  Activity: Get plenty of rest for the remainder of the day. A responsible individual must stay with you for 24 hours following the procedure.  For the next 24 hours, DO NOT: -Drive a car -Paediatric nurse -Drink alcoholic beverages -Take any medication unless instructed by your physician -Make any legal decisions or sign important papers.  Meals: Start with liquid foods such as gelatin or soup. Progress to regular foods as tolerated. Avoid greasy, spicy, heavy foods. If nausea and/or vomiting occur, drink only clear liquids until the nausea and/or vomiting subsides. Call your physician if vomiting continues.  Special Instructions/Symptoms: Your throat may feel dry or sore from the anesthesia or the breathing tube placed in your throat during surgery. If this causes discomfort, gargle with warm salt water. The discomfort should disappear within 24 hours.  If you had a scopolamine patch placed behind your ear for the management of post- operative nausea and/or vomiting:  1. The medication in the patch is effective for 72 hours, after which it should be removed.  Wrap patch in a tissue and discard in the trash. Wash hands thoroughly with soap and water. 2. You may remove the patch earlier than 72 hours if you experience unpleasant side effects which may include dry mouth, dizziness or visual disturbances. 3. Avoid touching the patch. Wash your hands with soap and water after contact with the patch.      [place] Office Phone Number [phone]  BREAST BIOPSY/ PARTIAL MASTECTOMY: POST OP INSTRUCTIONS  Always review your discharge instruction sheet given to you by the facility where your surgery was performed.  IF YOU HAVE DISABILITY OR FAMILY LEAVE FORMS, YOU MUST BRING THEM TO THE OFFICE FOR PROCESSING.  DO NOT GIVE THEM TO YOUR DOCTOR.  1. A prescription for pain medication may be given to you upon discharge.  Take your  pain medication as prescribed, if needed.  If narcotic pain medicine is not needed, then you may take acetaminophen (Tylenol) or ibuprofen (Advil) as needed. No Tylenol/Motrin/Ibuprofen until after 2pm 2. Take your usually prescribed medications unless otherwise directed 3. If you need a refill on your pain medication, please contact your pharmacy.  They will contact our office to request authorization.  Prescriptions will not be filled after 5pm or on week-ends. 4. You should eat very light the first 24 hours after surgery, such as soup, crackers, pudding, etc.  Resume your normal diet the day after surgery. 5. Most patients will experience some swelling and bruising in the breast.  Ice packs and a good support bra will help.  Swelling and bruising can take several days to resolve.  6. It is common to experience some constipation if taking pain medication after surgery.  Increasing fluid intake and taking a stool softener will usually help or prevent this problem from occurring.  A mild laxative (Milk of Magnesia or Miralax) should be taken according to package directions if there are no bowel movements after 48 hours. 7. Unless discharge instructions indicate otherwise, you may remove your bandages 24-48 hours after surgery, and you may shower at that time.  You may have steri-strips (small skin tapes) in place directly over the incision.  These strips should be left on the skin for 7-10 days.  If your surgeon used skin glue on the incision, you may shower in 24 hours.  The glue will flake off over the next 2-3 weeks.  Any sutures or staples will be removed at the office  during your follow-up visit. 8. ACTIVITIES:  You may resume regular daily activities (gradually increasing) beginning the next day.  Wearing a good support bra or sports bra minimizes pain and swelling.  You may have sexual intercourse when it is comfortable. a. You may drive when you no longer are taking prescription pain medication, you  can comfortably wear a seatbelt, and you can safely maneuver your car and apply brakes. b. RETURN TO WORK:  ______________________________________________________________________________________ 9. You should see your doctor in the office for a follow-up appointment approximately two weeks after your surgery.  Your doctors nurse will typically make your follow-up appointment when she calls you with your pathology report.  Expect your pathology report 2-3 business days after your surgery.  You may call to check if you do not hear from [place] after three days. 10. OTHER INSTRUCTIONS: _______________________________________________________________________________________________ _____________________________________________________________________________________________________________________________________ _____________________________________________________________________________________________________________________________________ _____________________________________________________________________________________________________________________________________  WHEN TO CALL YOUR DOCTOR: 1. Fever over 101.0 2. Nausea and/or vomiting. 3. Extreme swelling or bruising. 4. Continued bleeding from incision. 5. Increased pain, redness, or drainage from the incision.  The clinic staff is available to answer your questions during regular business hours.  Please dont hesitate to call and ask to speak to one of the nurses for clinical concerns.  If you have a medical emergency, go to the nearest emergency room or call 911.  A surgeon from [facility] is always on call at the hospital.  For further questions, please visit [email or web address]

## 2019-02-12 ENCOUNTER — Encounter (HOSPITAL_BASED_OUTPATIENT_CLINIC_OR_DEPARTMENT_OTHER): Payer: Self-pay | Admitting: Surgery

## 2019-02-19 NOTE — Progress Notes (Signed)
Location of Breast Cancer: Right Breast  Histology per Pathology Report:  01/08/19 Diagnosis Breast, right, needle core biopsy, lower outer quadrant at middle depth - DUCTAL CARCINOMA IN SITU - LOBULAR NEOPLASIA - CALCIFICATIONS  Receptor Status: ER(90%), PR (70%)  02/11/19 Diagnosis 1. Breast, lumpectomy, Left w/seed - BIOPSY SITE CHANGES. - ADENOSIS WITH LACTATIONAL CHANGES. - USUAL DUCT EPITHELIAL HYPERPLASIA. - FIBROCYSTIC CHANGES. 2. Breast, lumpectomy, Right w/seed - DUCTAL CARCINOMA IN SITU, INTERMEDIATE NUCLEAR GRADE, AT LEAST 1.2 CM. - MARGINS OF RESECTION ARE NOT INVOLVED (CLOSEST MARGIN: 3 MM, POSTERIOR/INFERIOR). - BIOPSY SITE CHANGES. - SEE ONCOLOGY TABLE.  Did patient present with symptoms or was this found on screening mammography?: It was found on a screening mammogram.   Past/Anticipated interventions by surgeon, if any: 02/11/19 Procedure:  Bilateral radioactive seed localized lumpectomies Surgeon:  TSUEI,MATTHEW K.  Past/Anticipated interventions by medical oncology, if any:  02/08/19 Dr. Lindi Adie Recommendation: 1. Breast conserving surgery 2. Followed by adjuvant radiation therapy 3. Followed by antiestrogen therapy with tamoxifen 5 years  History of AML: In remission Return to clinic after surgery to discuss the final pathology report and come up with an adjuvant treatment plan.  Lymphedema issues, if any:   She denies.   Pain issues, if any:  She reports soreness and burning at her incision site.   SAFETY ISSUES:  Prior radiation? No  Pacemaker/ICD? No  Possible current pregnancy? No, she is currently having periods, but denies possibility of pregnancy.   Is the patient on methotrexate? No  Current Complaints / other details:        Joann Meadows, Stephani Police, RN 02/19/2019,2:29 PM

## 2019-02-24 NOTE — Progress Notes (Signed)
HEMATOLOGY-ONCOLOGY [place] VISIT PROGRESS NOTE  I connected with Ria Clock on 02/26/2019 at  9:15 AM EDT by Webex video conference and verified that I am speaking with the correct person using two identifiers.  I discussed the limitations, risks, security and privacy concerns of performing an evaluation and management service by Webex and the availability of in person appointments.  I also discussed with the patient that there may be a patient responsible charge related to this service. The patient expressed understanding and agreed to proceed.  Patient's Location: Home Physician Location: Home/WebEx  CHIEF COMPLIANT: Follow-up s/p bilateral lumpectomies to review pathology  INTERVAL HISTORY: Margaretta Chittum is a 43 y.o. female with above-mentioned history of right breast DCIS who underwent bilateral lumpectomies on 02/11/19 for which pathology confirmed 1.2cm intermediate grade DCIS in the right breast, ER 90%, PR 70%, and atypical lobular hyperplasia and PASH in the left breast. She is here to discuss the treatment plan.  Radiation oncology has informed her that they will postpone radiation till June.  She is here also to discuss the pros and cons of starting tamoxifen right now.    Ductal carcinoma in situ (DCIS) of right breast   01/25/2019 Initial Diagnosis    Screening mammogram showed bilateral calcifications right breast 5 mm, pleomorphic.  Left breast 1.6 cm punctate, smaller group of calcifications spanning 3 mm.  Right lower outer quadrant biopsy revealed low-grade DCIS ER 90%, PR 70%; biopsy of left breast calcifications ALH and PASH    01/31/2019 Genetic Testing    ATM c.8495G>A and RECQL4 c.2587G>A VUS identified on the 9-gene STAT panel and Multi-cancer panel through [facility].  The STAT Breast cancer panel offered by [facility] includes sequencing and rearrangement analysis for the following 9 genes:  ATM, BRCA1, BRCA2, CDH1, CHEK2, PALB2, PTEN, STK11 and TP53.   The Multi-Gene Panel  offered by [facility] includes sequencing and/or deletion duplication testing of the following 84 genes: AIP, ALK, APC, ATM, AXIN2,BAP1,  BARD1, BLM, BMPR1A, BRCA1, BRCA2, BRIP1, CASR, CDC73, CDH1, CDK4, CDKN1B, CDKN1C, CDKN2A (p14ARF), CDKN2A (p16INK4a), CEBPA, CHEK2, CTNNA1, DICER1, DIS3L2, EGFR (c.2369C>T, p.Thr790Met variant only), EPCAM (Deletion/duplication testing only), FH, FLCN, GATA2, GPC3, GREM1 (Promoter region deletion/duplication testing only), HOXB13 (c.251G>A, p.Gly84Glu), HRAS, KIT, MAX, MEN1, MET, MITF (c.952G>A, p.Glu318Lys variant only), MLH1, MSH2, MSH3, MSH6, MUTYH, NBN, NF1, NF2, NTHL1, PALB2, PDGFRA, PHOX2B, PMS2, POLD1, POLE, POT1, PRKAR1A, PTCH1, PTEN, RAD50, RAD51C, RAD51D, RB1, RECQL4, RET, RUNX1, SDHAF2, SDHA (sequence changes only), SDHB, SDHC, SDHD, SMAD4, SMARCA4, SMARCB1, SMARCE1, STK11, SUFU, TERC, TERT, TMEM127, TP53, TSC1, TSC2, VHL, WRN and WT1.  he report date is February 02, 2019.    02/11/2019 Surgery    RT lumpectomy: DICS Intermediate grade, Margins Neg, ER 90%, PR 70%; Lt Lumpectomy: UDH     AML (acute myeloid leukemia) ([place])   09/1998 Initial Diagnosis    AML (acute myeloid leukemia) ([place]) treated with 2 induction regimens followed by 6 consolidation regimens, remission     Observations/Objective:  There were no vitals filed for this visit. There is no height or weight on file to calculate BMI.  I have reviewed the data as listed CMP Latest Ref Rng & Units 03/01/2017  Glucose 65 - 99 mg/dL 91  BUN 6 - 20 mg/dL 9  Creatinine 0.44 - 1.00 mg/dL 0.56  Sodium 135 - 145 mmol/L 136  Potassium 3.5 - 5.1 mmol/L 3.7  Chloride 101 - 111 mmol/L 108  CO2 22 - 32 mmol/L 20(L)  Calcium 8.9 - 10.3 mg/dL 8.0(L)  Total  Protein 6.5 - 8.1 g/dL 5.8(L)  Total Bilirubin 0.3 - 1.2 mg/dL 0.2(L)  Alkaline Phos 38 - 126 U/L 63  AST 15 - 41 U/L 16  ALT 14 - 54 U/L 16    Lab Results  Component Value Date   WBC 15.3 (H) 03/26/2017   HGB 11.9 (L) 03/26/2017   HCT 35.8 (L)  03/26/2017   MCV 93.2 03/26/2017   PLT 186 03/26/2017      Assessment Plan:  Ductal carcinoma in situ (DCIS) of right breast 02/11/19: RT lumpectomy: DICS Intermediate grade, Margins Neg, ER 90%, PR 70%; Lt Lumpectomy: UDH Stage TisNx Stage 0  Pathology counseling: I discussed the final pathology report of the patient provided  a copy of this report. I discussed the margins. We also discussed the final staging along with previously performed ER/PR testing.  Plan: 1. Adj RT 2. Adj Tamoxifen for 5 years  If the Radiation is delayed due to [facility] Virus pandemic, we can start her on Tamoxifen. We discussed the risks and benefits of tamoxifen. These include but not limited to insomnia, hot flashes, mood changes, vaginal dryness, and weight gain. Although rare, serious side effects including endometrial cancer, risk of blood clots were also discussed. We strongly believe that the benefits far outweigh the risks. Patient understands these risks and consented to starting treatment. Planned treatment duration is 5 years.  RTC in 6 months for follow up for SCP visit     I discussed the assessment and treatment plan with the patient. The patient was provided an opportunity to ask questions and all were answered. The patient agreed with the plan and demonstrated an understanding of the instructions. The patient was advised to call back or seek an in-person evaluation if the symptoms worsen or if the condition fails to improve as anticipated.   I provided 15 minutes of face-to-face Web Ex time during this encounter.    Rulon Eisenmenger, MD 02/26/2019    I, Molly Dorshimer, am acting as scribe for Nicholas Lose, MD.  I have reviewed the above documentation for accuracy and completeness, and I agree with the above.

## 2019-02-24 NOTE — Progress Notes (Addendum)
Radiation Oncology         [phone] ________________________________  Initial TELEMEDICINE Consultation  Name: Joann Meadows MRN: [id number]  Date: 02/26/2019  DOB: 02-Oct-1976  [id number], L.Marlou Sa, MD  Donnie Mesa, MD   REFERRING PHYSICIAN: Donnie Mesa, MD  DIAGNOSIS:    ICD-10-CM   1. Malignant neoplasm of lower-outer quadrant of right breast of female, estrogen receptor positive ([place]) C50.511    Z17.0     Stage 0 Right Breast Ductal Carcinoma In Situ, ER+ / PR+, Grade 2  CHIEF COMPLAINT: Here to discuss management of right breast DCIS  HISTORY OF PRESENT ILLNESS::Joann Meadows is a 43 y.o. female who presented with breast abnormality on the following imaging: screening mammogram.  Symptoms, if any, at that time, were: none.   Digital diagnostic mammogram of breasts on 01/05/2019 revealed a 5 mm group of indeterminate calcifications in the lateral inferior right breast, and 2 indeterminate groups on the left spanning 16 mm and 3 mm.  Right breast biopsy on date of 01/08/2019 showed ductal carcinoma in situ (0.5 cm), lobular neoplasia, and calcifications.  ER status: 90% positive; PR status 70% positive.  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   She opted for a [facility]  Consultation to reduce risk of COVID 19 transmission.   She underwent genetics testing on 02/02/2019, with negative results.  She met with Dr. Lindi Adie on 02/08/2019. His recommendation was for her to undergo breast conserving surgery, followed by adjuvant radiation therapy, and finally antiestrogen therapy with tamoxifen for 5 years.  She proceeded to bilateral breast lumpectomies on 02/11/2019. Pathology from the procedure revealed: right breast DCIS, grade 2, at least 1.2 cm, margins not involved; left breast adenosis with lactational changes.  She was cured of AML about 2 decades ago with  chemotherapy alone.  She has two children, ages 47 and 82.  PREVIOUS RADIATION THERAPY: No  PAST MEDICAL HISTORY:  has a past medical history of Blood transfusion without reported diagnosis, Breast cancer ([place]), Cholestasis during pregnancy, Endometriosis, Family history of colon cancer, Family history of leukemia, Fibroid, Leukemia ([place]), and PONV (postoperative nausea and vomiting).    PAST SURGICAL HISTORY: Past Surgical History:  Procedure Laterality Date   BREAST LUMPECTOMY WITH RADIOACTIVE SEED LOCALIZATION Bilateral 02/11/2019   Procedure: BILATERAL BREAST LUMPECTOMIES WITH BILATERAL RADIOACTIVE SEED LOCALIZATION;  Surgeon: Donnie Mesa, MD;  Location: [facility];  Service: General;  Laterality: Bilateral;   NO PAST SURGERIES      FAMILY HISTORY: family history includes Colon cancer (age of onset: 61) in her mother; Diabetes in her maternal aunt, maternal grandmother, maternal uncle, and paternal aunt; Hypertension in her father; Leukemia in her paternal grandfather; Leukemia (age of onset: 59) in her cousin; Lymphoma (age of onset: 28) in her cousin; Mental retardation in her maternal uncle; Stroke in her paternal grandmother.  SOCIAL HISTORY:  reports that she quit smoking about 4 years ago. Her smoking use included cigarettes. She quit after 10.00 years of use. She has never used smokeless tobacco. She reports that she does not drink alcohol or use drugs.  ALLERGIES: Patient has no known allergies.  MEDICATIONS:  Current Outpatient Medications  Medication Sig Dispense Refill   acetaminophen (TYLENOL) 325 MG tablet Take 650 mg by mouth every 6 (six) hours as needed.     ibuprofen (ADVIL,MOTRIN) 600 MG tablet Take 1 tablet (600 mg total) by mouth every 6 (six) hours.  30 tablet 1   HYDROcodone-acetaminophen (NORCO/VICODIN) 5-325 MG tablet Take 1 tablet by mouth every 6 (six) hours as needed for moderate pain. (Patient not taking: Reported on 02/26/2019) 15 tablet 0     No current facility-administered medications for this encounter.     REVIEW OF SYSTEMS: As above   PHYSICAL EXAM:  vitals were not taken for this visit.   General: Alert and oriented, in no acute distress     LABORATORY DATA:  Lab Results  Component Value Date   WBC 15.3 (H) 03/26/2017   HGB 11.9 (L) 03/26/2017   HCT 35.8 (L) 03/26/2017   MCV 93.2 03/26/2017   PLT 186 03/26/2017   CMP     Component Value Date/Time   NA 136 03/01/2017 1337   K 3.7 03/01/2017 1337   CL 108 03/01/2017 1337   CO2 20 (L) 03/01/2017 1337   GLUCOSE 91 03/01/2017 1337   BUN 9 03/01/2017 1337   CREATININE 0.56 03/01/2017 1337   CALCIUM 8.0 (L) 03/01/2017 1337   PROT 5.8 (L) 03/01/2017 1337   ALBUMIN 2.7 (L) 03/01/2017 1337   AST 16 03/01/2017 1337   ALT 16 03/01/2017 1337   ALKPHOS 63 03/01/2017 1337   BILITOT 0.2 (L) 03/01/2017 1337   GFRNONAA >60 03/01/2017 1337   GFRAA >60 03/01/2017 1337         RADIOGRAPHY: Mm Breast Surgical Specimen  Result Date: 02/11/2019 CLINICAL DATA:  RIGHT breast DCIS status post lumpectomy today after earlier radioactive seed localization. EXAM: SPECIMEN RADIOGRAPH OF THE RIGHT BREAST COMPARISON:  Previous exam(s). FINDINGS: Status post excision of the right breast. The radioactive seed and biopsy marker clip are present, completely intact, and were marked for pathology. IMPRESSION: Specimen radiograph of the right breast. Electronically Signed   By: Franki Cabot M.D.   On: 02/11/2019 10:41   Mm Breast Surgical Specimen  Result Date: 02/11/2019 CLINICAL DATA:  Patient with LEFT breast lobular neoplasia status post surgical excision today after earlier radioactive seed localization. EXAM: SPECIMEN RADIOGRAPH OF THE LEFT BREAST COMPARISON:  Previous exam(s). FINDINGS: Status post excision of the left breast. The radioactive seed and biopsy marker clip are present, completely intact, and were marked for pathology. IMPRESSION: Specimen radiograph of the left  breast. Electronically Signed   By: Franki Cabot M.D.   On: 02/11/2019 10:16   Mm Lt Radioactive Seed Loc Mammo Guide  Result Date: 02/10/2019 CLINICAL DATA:  43 year old female for radioactive seed localization of RIGHT breast DCIS and LEFT breast lobular neoplasia. EXAM: MAMMOGRAPHIC GUIDED RADIOACTIVE SEED LOCALIZATION OF THE RIGHT BREAST MAMMOGRAPHIC GUIDED RADIOACTIVE SEED LOCALIZATION OF THE LEFT BREAST COMPARISON:  Previous exam(s). FINDINGS: Patient presents for radioactive seed localization prior to surgical excisions. I met with the patient and we discussed the procedures of seed localization including benefits and alternatives. We discussed the high likelihood of successful procedures. We discussed the risks of the procedures including infection, bleeding, tissue injury and further surgery. We discussed the low dose of radioactivity involved in the procedures. Informed, written consent was given. The usual time-out protocol was performed immediately prior to the procedures. MAMMOGRAPHIC GUIDED RADIOACTIVE SEED LOCALIZATION OF THE RIGHT BREAST: Using mammographic guidance, sterile technique, 1% lidocaine and an I-125 radioactive seed, the COIL clip and calcifications were localized using a LATERAL approach. The follow-up mammogram images confirm the seed in the expected location and were marked for Dr. [name]. Follow-up survey of the patient confirms presence of the radioactive seed. Order number of I-125 seed:  [id number].  Total activity:  1.610 millicuries.  Reference Date: 01/15/2019 MAMMOGRAPHIC GUIDED RADIOACTIVE SEED LOCALIZATION OF THE LEFT BREAST: Using mammographic guidance, sterile technique, 1% lidocaine and an I-125 radioactive seed, the COIL clip in the LOWER OUTER LEFT breast was localized using a LATERAL approach. The follow-up mammogram images confirm the seed in the expected location and were marked for Dr. [name]. Follow-up survey of the patient confirms presence of the radioactive  seed. Order number of I-125 seed:  [id number]. Total activity:  1.191 millicuries.  Reference Date: 02/02/2019 The patient tolerated the procedures well and was released from the [facility]. She was given instructions regarding seed removal. IMPRESSION: Radioactive seed localization of the RIGHT breast and radioactive seed localization of the LEFT breast. No apparent complications. Electronically Signed   By: Margarette Canada M.D.   On: 02/10/2019 15:59   Mm Rt Radioactive Seed Loc Mammo Guide  Result Date: 02/10/2019 CLINICAL DATA:  43 year old female for radioactive seed localization of RIGHT breast DCIS and LEFT breast lobular neoplasia. EXAM: MAMMOGRAPHIC GUIDED RADIOACTIVE SEED LOCALIZATION OF THE RIGHT BREAST MAMMOGRAPHIC GUIDED RADIOACTIVE SEED LOCALIZATION OF THE LEFT BREAST COMPARISON:  Previous exam(s). FINDINGS: Patient presents for radioactive seed localization prior to surgical excisions. I met with the patient and we discussed the procedures of seed localization including benefits and alternatives. We discussed the high likelihood of successful procedures. We discussed the risks of the procedures including infection, bleeding, tissue injury and further surgery. We discussed the low dose of radioactivity involved in the procedures. Informed, written consent was given. The usual time-out protocol was performed immediately prior to the procedures. MAMMOGRAPHIC GUIDED RADIOACTIVE SEED LOCALIZATION OF THE RIGHT BREAST: Using mammographic guidance, sterile technique, 1% lidocaine and an I-125 radioactive seed, the COIL clip and calcifications were localized using a LATERAL approach. The follow-up mammogram images confirm the seed in the expected location and were marked for Dr. [name]. Follow-up survey of the patient confirms presence of the radioactive seed. Order number of I-125 seed:  [id number]. Total activity:  3.086 millicuries.  Reference Date: 01/15/2019 MAMMOGRAPHIC GUIDED RADIOACTIVE SEED  LOCALIZATION OF THE LEFT BREAST: Using mammographic guidance, sterile technique, 1% lidocaine and an I-125 radioactive seed, the COIL clip in the LOWER OUTER LEFT breast was localized using a LATERAL approach. The follow-up mammogram images confirm the seed in the expected location and were marked for Dr. [name]. Follow-up survey of the patient confirms presence of the radioactive seed. Order number of I-125 seed:  [id number]. Total activity:  5.284 millicuries.  Reference Date: 02/02/2019 The patient tolerated the procedures well and was released from the [facility]. She was given instructions regarding seed removal. IMPRESSION: Radioactive seed localization of the RIGHT breast and radioactive seed localization of the LEFT breast. No apparent complications. Electronically Signed   By: Margarette Canada M.D.   On: 02/10/2019 15:59      IMPRESSION/PLAN: Right Breast DCIS   It was a pleasure meeting the patient today. We discussed the risks, benefits, and side effects of radiotherapy. I recommend radiotherapy to the right breast to reduce her risk of locoregional recurrence by 1/2.  We discussed that radiation would take approximately 4 weeks to complete and that I would give the patient a few weeks to heal following surgery before starting treatment planning. We spoke about acute effects including skin irritation and fatigue as well as much less common late effects including internal organ injury or irritation. We spoke about the latest technology that is used to minimize the risk of late effects  for patients undergoing radiotherapy to the breast or chest wall. No guarantees of treatment were given. The patient is enthusiastic about proceeding with treatment. I look forward to participating in the patient's care.    We discussed the pros/ cons of radiotherapy now vs later while carefully considering her particular oncologic condition and also factoring the risks of the COVID 19 pandemic.  We are in agreement that  it makes sense to delay radiotherapy to maximize her ability to stay at home and reduce the risk of catching the virus outside the home.  I let her know that I will share the above plan with medical oncology so that they can consider starting antiestrogen therapy for her sooner rather than later.  She is very pleased with this plan.  I will order rad onc scheduling to arrange a follow-up in person with me, plus CT simulation/treatment planning, in June, once we anticipate that the pandemic will have dissipated.   This encounter was provided by telemedicine platform Webex.  The patient has given verbal consent for this type of encounter and has been advised to only accept a meeting of this type in a secure network environment. The time spent during this encounter was 15 minutes. The attendants for this meeting include Eppie Gibson  and Ria Clock.  During the encounter, Eppie Gibson was located at [facility] Radiation Oncology Department.  Joann Meadows was located at home.  __________________________________________   Eppie Gibson, MD   This document serves as a record of services personally performed by Eppie Gibson, MD. It was created on her behalf by Wilburn Mylar, a trained medical scribe. The creation of this record is based on the scribe's personal observations and the provider's statements to them. This document has been checked and approved by the attending provider.

## 2019-02-25 ENCOUNTER — Telehealth: Payer: Self-pay

## 2019-02-25 NOTE — Telephone Encounter (Signed)
Pt left voicemail asking for return call.  Nurse placed return call, pt unavailable at time of call.

## 2019-02-26 ENCOUNTER — Other Ambulatory Visit: Payer: Self-pay

## 2019-02-26 ENCOUNTER — Inpatient Hospital Stay: Payer: BLUE CROSS/BLUE SHIELD | Attending: Genetic Counselor | Admitting: Hematology and Oncology

## 2019-02-26 ENCOUNTER — Ambulatory Visit
Admission: RE | Admit: 2019-02-26 | Discharge: 2019-02-26 | Disposition: A | Discharge status: Home / Self Care | Payer: BLUE CROSS/BLUE SHIELD | Source: Ambulatory Visit | Attending: Radiation Oncology | Admitting: Radiation Oncology

## 2019-02-26 ENCOUNTER — Encounter: Payer: Self-pay | Admitting: Radiation Oncology

## 2019-02-26 DIAGNOSIS — D0511 Intraductal carcinoma in situ of right breast: Secondary | ICD-10-CM | POA: Diagnosis not present

## 2019-02-26 DIAGNOSIS — Z170 Estrogen receptor positive status [ER+]: Secondary | ICD-10-CM

## 2019-02-26 DIAGNOSIS — N62 Hypertrophy of breast: Secondary | ICD-10-CM | POA: Diagnosis not present

## 2019-02-26 DIAGNOSIS — C50511 Malignant neoplasm of lower-outer quadrant of right female breast: Secondary | ICD-10-CM

## 2019-02-26 DIAGNOSIS — Z9889 Other specified postprocedural states: Secondary | ICD-10-CM | POA: Diagnosis not present

## 2019-02-26 MED ORDER — TAMOXIFEN CITRATE 20 MG PO TABS: 20.0000 mg | ORAL_TABLET | Freq: Every day | ORAL | 3 refills | Status: DC

## 2019-02-26 NOTE — Addendum Note (Signed)
Encounter addended by: Eppie Gibson, MD on: 02/26/2019 10:35 AM  Actions taken: Clinical Note Signed

## 2019-02-26 NOTE — Assessment & Plan Note (Signed)
02/11/19: RT lumpectomy: DICS Intermediate grade, Margins Neg, ER 90%, PR 70%; Lt Lumpectomy: UDH Stage TisNx Stage 0  Pathology counseling: I discussed the final pathology report of the patient provided  a copy of this report. I discussed the margins. We also discussed the final staging along with previously performed ER/PR testing.  Plan: 1. Adj RT 2. Adj Tamoxifen for 5 years  If the Radiation is delayed due to [facility] Virus pandemic, we can start her on Tamoxifen. We discussed the risks and benefits of tamoxifen. These include but not limited to insomnia, hot flashes, mood changes, vaginal dryness, and weight gain. Although rare, serious side effects including endometrial cancer, risk of blood clots were also discussed. We strongly believe that the benefits far outweigh the risks. Patient understands these risks and consented to starting treatment. Planned treatment duration is 5 years.  RTC in 6 months for follow up for SCP visit

## 2019-03-01 ENCOUNTER — Telehealth: Payer: Self-pay | Admitting: *Deleted

## 2019-03-01 NOTE — Telephone Encounter (Signed)
CALLED PATIENT TO INFORM OF [facility] APPT. AND SIM APPT. ON 05-12-19, SPOKE WITH PATIENT AND SHE IS AWARE OF THESE APPTS.

## 2019-05-04 ENCOUNTER — Telehealth: Payer: Self-pay | Admitting: *Deleted

## 2019-05-04 NOTE — Telephone Encounter (Signed)
Received call from pt stating she is going to start radiation treatment on June 17th and wanted to know if she needed to continue taking her tamoxifen or stop.  Per Dr. Lindi Adie, since pt has been taking tamoxifen x3 months, she will continue to take medication while receive radiation therapy.  Pt verbalized understanding.

## 2019-05-07 NOTE — Progress Notes (Signed)
Location of Breast Cancer: Right Breast  Histology per Pathology Report:  01/08/19 Diagnosis Breast, right, needle core biopsy, lower outer quadrant at middle depth - DUCTAL CARCINOMA IN SITU - LOBULAR NEOPLASIA - CALCIFICATIONS  Receptor Status: ER(90%), PR (70%)  02/11/19 Diagnosis 1. Breast, lumpectomy, Left w/seed - BIOPSY SITE CHANGES. - ADENOSIS WITH LACTATIONAL CHANGES. - USUAL DUCT EPITHELIAL HYPERPLASIA. - FIBROCYSTIC CHANGES. 2. Breast, lumpectomy, Right w/seed - DUCTAL CARCINOMA IN SITU, INTERMEDIATE NUCLEAR GRADE, AT LEAST 1.2 CM. - MARGINS OF RESECTION ARE NOT INVOLVED (CLOSEST MARGIN: 3 MM, POSTERIOR/INFERIOR). - BIOPSY SITE CHANGES. - SEE ONCOLOGY TABLE.  Did patient present with symptoms or was this found on screening mammography?: It was found on a screening mammogram.   Past/Anticipated interventions by surgeon, if any: 02/11/19 Procedure:Bilateralradioactive seed localized lumpectomies Surgeon: TSUEI,MATTHEW K.  Past/Anticipated interventions by medical oncology, if any:  02/08/19 Dr. Lindi Adie Recommendation: 1. Breast conserving surgery 2. Followed by adjuvant radiation therapy 3. Followed by antiestrogen therapy with tamoxifen 5 years  History of AML: In remission Return to clinic after surgery to discuss the final pathology report and come up with an adjuvant treatment plan.  ++She is currently taking Tamoxifen prescribed by Dr. Lindi Adie.   Lymphedema issues, if any:   She denies.   Pain issues, if any:  She reports tingling/soreness to her bilateral fingers since starting Tamoxifen. She experiences this especially at night. She also reports pain to her Left hip.    SAFETY ISSUES:  Prior radiation? No  Pacemaker/ICD? No  Possible current pregnancy? No, she is currently having periods, but denies possibility of pregnancy. She denies having intercourse at this time.   Is the patient on methotrexate? No  Current Complaints / other  details:    BP (!) 151/94 (BP Location: Left Arm, Patient Position: Sitting)   Pulse 78   Temp 99 F (37.2 C) (Temporal)   Ht 5\' 5"  (1.651 m)   Wt 156 lb 4 oz (70.9 kg)   SpO2 100%   BMI 26.00 kg/m    Wt Readings from Last 3 Encounters:  05/12/19 156 lb 4 oz (70.9 kg)  02/11/19 153 lb 7 oz (69.6 kg)  02/08/19 153 lb 9.6 oz (69.7 kg)

## 2019-05-10 ENCOUNTER — Encounter: Payer: Self-pay | Admitting: *Deleted

## 2019-05-12 ENCOUNTER — Ambulatory Visit
Admission: RE | Admit: 2019-05-12 | Discharge: 2019-05-12 | Disposition: A | Discharge status: Home / Self Care | Payer: BC Managed Care – PPO | Source: Ambulatory Visit | Attending: Radiation Oncology | Admitting: Radiation Oncology

## 2019-05-12 ENCOUNTER — Encounter: Payer: Self-pay | Admitting: Radiation Oncology

## 2019-05-12 ENCOUNTER — Encounter: Payer: Self-pay | Admitting: *Deleted

## 2019-05-12 ENCOUNTER — Other Ambulatory Visit: Payer: Self-pay

## 2019-05-12 DIAGNOSIS — F4323 Adjustment disorder with mixed anxiety and depressed mood: Secondary | ICD-10-CM | POA: Diagnosis not present

## 2019-05-12 DIAGNOSIS — C50511 Malignant neoplasm of lower-outer quadrant of right female breast: Secondary | ICD-10-CM

## 2019-05-12 DIAGNOSIS — Z170 Estrogen receptor positive status [ER+]: Secondary | ICD-10-CM

## 2019-05-12 DIAGNOSIS — D0511 Intraductal carcinoma in situ of right breast: Secondary | ICD-10-CM

## 2019-05-12 DIAGNOSIS — Z510 Encounter for antineoplastic radiation therapy: Secondary | ICD-10-CM | POA: Diagnosis not present

## 2019-05-12 DIAGNOSIS — Z9889 Other specified postprocedural states: Secondary | ICD-10-CM | POA: Diagnosis not present

## 2019-05-12 LAB — PREGNANCY, URINE: Preg Test, Ur: NEGATIVE

## 2019-05-12 NOTE — Patient Instructions (Signed)
Coronavirus (COVID-19) Are you at risk?  Are you at risk for the Coronavirus (COVID-19)?  To be considered HIGH RISK for Coronavirus (COVID-19), you have to meet the following criteria:  . Traveled to [place], [place], [place], [place] or [place]; or in the [place] to [place], [place], [place], or [place]; and have fever, cough, and shortness of breath within the last 2 weeks of travel OR . Been in close contact with a person diagnosed with COVID-19 within the last 2 weeks and have fever, cough, and shortness of breath . IF YOU DO NOT MEET THESE CRITERIA, YOU ARE CONSIDERED LOW RISK FOR COVID-19.  What to do if you are HIGH RISK for COVID-19?  . If you are having a medical emergency, call 911. . Seek medical care right away. Before you go to a doctor's office, urgent care or emergency department, call ahead and tell them about your recent travel, contact with someone diagnosed with COVID-19, and your symptoms. You should receive instructions from your physician's office regarding next steps of care.  . When you arrive at healthcare provider, tell the healthcare staff immediately you have returned from visiting [place], [place], [place], [place] or [place]; or traveled in the [place] to [place], [place], [place], or [place]; in the last two weeks or you have been in close contact with a person diagnosed with COVID-19 in the last 2 weeks.   . Tell the health care staff about your symptoms: fever, cough and shortness of breath. . After you have been seen by a medical provider, you will be either: o Tested for (COVID-19) and discharged home on quarantine except to seek medical care if symptoms worsen, and asked to  - Stay home and avoid contact with others until you get your results (4-5 days)  - Avoid travel on public transportation if possible (such as bus, train, or airplane) or o Sent to the Emergency Department by EMS for evaluation, COVID-19 testing, and possible  admission depending on your condition and test results.  What to do if you are LOW RISK for COVID-19?  Reduce your risk of any infection by using the same precautions used for avoiding the common cold or flu:  . Wash your hands often with soap and warm water for at least 20 seconds.  If soap and water are not readily available, use an alcohol-based hand sanitizer with at least 60% alcohol.  . If coughing or sneezing, cover your mouth and nose by coughing or sneezing into the elbow areas of your shirt or coat, into a tissue or into your sleeve (not your hands). . Avoid shaking hands with others and consider head nods or verbal greetings only. . Avoid touching your eyes, nose, or mouth with unwashed hands.  . Avoid close contact with people who are sick. . Avoid places or events with large numbers of people in one location, like concerts or sporting events. . Carefully consider travel plans you have or are making. . If you are planning any travel outside or inside the [place], visit the CDC's Travelers' Health webpage for the latest health notices. . If you have some symptoms but not all symptoms, continue to monitor at home and seek medical attention if your symptoms worsen. . If you are having a medical emergency, call 911.   ADDITIONAL HEALTHCARE OPTIONS FOR PATIENTS  [facility] / e-Visit: [email or web address]         [facility]: [phone]  [facility]: [phone]                   [facility]: [phone]   

## 2019-05-12 NOTE — Progress Notes (Signed)
  Radiation Oncology         [phone] ________________________________  Name: Joann Meadows MRN: [id number]  Date: 05/12/2019  DOB: [birth year]  SIMULATION AND TREATMENT PLANNING NOTE    Outpatient  DIAGNOSIS:     ICD-10-CM   1. Ductal carcinoma in situ (DCIS) of right breast  D05.11     NARRATIVE:  The patient was brought to the [facility].  Identity was confirmed.  All relevant records and images related to the planned course of therapy were reviewed.  The patient freely provided informed written consent to proceed with treatment after reviewing the details related to the planned course of therapy. The consent form was witnessed and verified by the simulation staff.    Then, the patient was set-up in a stable reproducible supine position for radiation therapy with her ipsilateral arm over her head, and her upper body secured in a custom-made Vac-lok device.  CT images were obtained.  Surface markings were placed.  The CT images were loaded into the planning software.    TREATMENT PLANNING NOTE: Treatment planning then occurred.  The radiation prescription was entered and confirmed.     A total of 3 medically necessary complex treatment devices were fabricated and supervised by me: 2 fields with MLCs for custom blocks to protect heart, and lungs;  and, a Vac-lok. MORE COMPLEX DEVICES MAY BE MADE IN DOSIMETRY FOR FIELD IN FIELD BEAMS FOR DOSE HOMOGENEITY.  I have requested : 3D Simulation which is medically necessary to give adequate dose to at risk tissues while sparing lungs and heart.  I have requested a DVH of the following structures: lungs, heart, lumpectomy cavity.    The patient will receive 40.05 Gy in 15 fractions to the right breast with 2 tangential fields.  This will be followed by a boost.  Optical Surface Tracking Plan:  Since intensity modulated radiotherapy (IMRT) and 3D conformal radiation treatment methods are predicated on accurate and precise  positioning for treatment, intrafraction motion monitoring is medically necessary to ensure accurate and safe treatment delivery. The ability to quantify intrafraction motion without excessive ionizing radiation dose can only be performed with optical surface tracking. Accordingly, surface imaging offers the opportunity to obtain 3D measurements of patient position throughout IMRT and 3D treatments without excessive radiation exposure. I am ordering optical surface tracking for this patient's upcoming course of radiotherapy.  ________________________________   Reference:  Ursula Alert, J, et al. Surface imaging-based analysis of intrafraction motion for breast radiotherapy patients.Journal of [place], n. 6, nov. 2014. ISSN [id number].  Available at: <http://www.jacmp.org/index.php/jacmp/article/view/4957>.    -----------------------------------  Eppie Gibson, MD

## 2019-05-14 ENCOUNTER — Telehealth: Payer: Self-pay | Admitting: Hematology and Oncology

## 2019-05-14 ENCOUNTER — Telehealth: Payer: Self-pay

## 2019-05-14 DIAGNOSIS — Z510 Encounter for antineoplastic radiation therapy: Secondary | ICD-10-CM | POA: Diagnosis not present

## 2019-05-14 DIAGNOSIS — D0511 Intraductal carcinoma in situ of right breast: Secondary | ICD-10-CM | POA: Diagnosis not present

## 2019-05-14 NOTE — Telephone Encounter (Signed)
Pt called reporting episode of dizziness, sweating, numbness in extremities, dry mouth and very weak for about 3hrs on 6/18.  Episode resolved but pt feels anxious - "is the tamoxifen causing this". Pt does not report any change in appetite - eating 3 meals a day. Pt reports increased water intake "I drink water all the time" and increased thirst "I am always thirsty". Pt reports she noticed her BP runs higher since starting Tamoxifen in April  - 150s / 80s and increase sadness, depression and anxiety. Pt states she is not getting out of the house much and not getting exercise. Above reviewed with Dr Lindi Adie.  Per Dr Lindi Adie pt should stop Tamoxifen until xrt complete the he will see pt and decide on a treatment plan. Pt given these instructions and verbalizes understanding. Pt was also instructed to try to get out of the house to get some exercise such as walking and / or yoga as this may help with mood and blood pressure as well. Appt made for pt to see Dr Lindi Adie on 7/22.  Pt aware of appt date, time.

## 2019-05-14 NOTE — Telephone Encounter (Signed)
Patient already on schedule for July as requested per 6/17 schedule message.

## 2019-05-15 ENCOUNTER — Encounter: Payer: Self-pay | Admitting: Radiation Oncology

## 2019-05-15 NOTE — Progress Notes (Signed)
Radiation Oncology         [phone] ________________________________  Name: Joann Meadows MRN: [id number]  Date: 05/12/2019  DOB: [birth year]  Follow-Up Visit Note  Outpatient  CC: [name], L.Marlou Sa, MD  Donnie Mesa, MD  Diagnosis:      ICD-10-CM   1. Ductal carcinoma in situ (DCIS) of right breast  D05.11 Pregnancy, urine    Ambulatory referral to Social Work    Stage 0 DCIS, right breast   CHIEF COMPLAINT: Here to discuss management of DCIS, right breast  Narrative:  The patient returns today for follow-up.   She is status post lumpectomy with clear margins for ER+ intermediate grade DCIS of the right breast.  She is taking Tamoxifen. She reports being tearful with depressed mood which is uncharacteristic for her - She is also very anxious.  She finds is traumatic to be back in a cancer center as it brings up memories from her prior AML.         ALLERGIES:  has No Known Allergies.  Meds: Current Outpatient Medications  Medication Sig Dispense Refill  . acetaminophen (TYLENOL) 325 MG tablet Take 650 mg by mouth every 6 (six) hours as needed.    Marland Kitchen ibuprofen (ADVIL,MOTRIN) 600 MG tablet Take 1 tablet (600 mg total) by mouth every 6 (six) hours. 30 tablet 1  . tamoxifen (NOLVADEX) 20 MG tablet Take 1 tablet (20 mg total) by mouth daily. 90 tablet 3  . HYDROcodone-acetaminophen (NORCO/VICODIN) 5-325 MG tablet Take 1 tablet by mouth every 6 (six) hours as needed for moderate pain. (Patient not taking: Reported on 02/26/2019) 15 tablet 0   No current facility-administered medications for this encounter.     Physical Findings:  height is 5\' 5"  (1.651 m) and weight is 156 lb 4 oz (70.9 kg). Her temporal temperature is 99 F (37.2 C). Her blood pressure is 151/94 (abnormal) and her pulse is 78. Her oxygen saturation is 100%. .     General: Alert and oriented, tearful Musculoskeletal: symmetric strength and muscle tone throughout. - good ROM in shoulders Neurologic: No obvious  focalities. Speech is fluent.  Psychiatric: Judgment and insight are intact. Affect is appropriate. Breast exam reveals good healing of surgical scar  Lab Findings: Lab Results  Component Value Date   WBC 15.3 (H) 03/26/2017   HGB 11.9 (L) 03/26/2017   HCT 35.8 (L) 03/26/2017   MCV 93.2 03/26/2017   PLT 186 03/26/2017    @LASTCHEMISTRY @  Radiographic Findings: No results found.  Impression/Plan:  I advised her to call med/onc to discuss if her mood changes are related to Tamoxifen. I will also refer her to Social work for support.  I assured her of her excellent prognosis.  We discussed adjuvant radiotherapy today.  I recommend adjuvant RT to the right breast in order to reduce risk of locoregional recurrence by half.  The risks, benefits and side effects of this treatment were discussed in detail.  She understands that radiotherapy is associated with skin irritation and fatigue in the acute setting. Late effects can include cosmetic changes and rare injury to internal organs.   She is enthusiastic about proceeding with treatment. A consent form has been signed and placed in her chart.  A total of 3 medically necessary complex treatment devices will be fabricated and supervised by me: 2 fields with MLCs for custom blocks to protect heart, and lungs;  and, a Vac-lok. MORE COMPLEX DEVICES MAY BE MADE IN DOSIMETRY FOR FIELD IN FIELD BEAMS FOR DOSE  HOMOGENEITY.  I have requested : 3D Simulation which is medically necessary to give adequate dose to at risk tissues while sparing lungs and heart.  I have requested a DVH of the following structures: lungs, heart, right lumpectomy cavity.    The patient will receive 40.05 Gy in 15 fractions to the right breast with 2 fields.  This will be followed by a boost. Simulation today.   I spent 15 minutes minutes face to face with the patient and more than 50% of that time was spent in counseling and/or coordination of care.  _____________________________________   Eppie Gibson, MD

## 2019-05-19 ENCOUNTER — Ambulatory Visit
Admission: RE | Admit: 2019-05-19 | Discharge: 2019-05-19 | Disposition: A | Discharge status: Home / Self Care | Payer: BC Managed Care – PPO | Source: Ambulatory Visit | Attending: Radiation Oncology | Admitting: Radiation Oncology

## 2019-05-19 ENCOUNTER — Other Ambulatory Visit: Payer: Self-pay

## 2019-05-19 DIAGNOSIS — D0511 Intraductal carcinoma in situ of right breast: Secondary | ICD-10-CM

## 2019-05-19 DIAGNOSIS — Z510 Encounter for antineoplastic radiation therapy: Secondary | ICD-10-CM | POA: Diagnosis not present

## 2019-05-19 MED ORDER — RADIAPLEXRX EX GEL
Freq: Once | CUTANEOUS | Status: AC
  Administered 2019-05-19: 17:00:00 via TOPICAL

## 2019-05-19 NOTE — Progress Notes (Signed)
Pt here for patient teaching.  Pt given Radiation and You booklet and Radiaplex gel.  Reviewed areas of pertinence such as fatigue, hair loss, skin changes, breast tenderness and breast swelling . Pt able to give teach back of to pat skin and use unscented/gentle soap,apply Radiaplex bid, avoid applying anything to skin within 4 hours of treatment, avoid wearing an under wire bra and to use an electric razor if they must shave. Pt verbalized understanding of information given and will contact nursing with any questions or concerns.    Gloriajean Dell. Leonie Green, BSN

## 2019-05-20 ENCOUNTER — Encounter: Payer: Self-pay | Admitting: General Practice

## 2019-05-20 ENCOUNTER — Ambulatory Visit
Admission: RE | Admit: 2019-05-20 | Discharge: 2019-05-20 | Disposition: A | Discharge status: Home / Self Care | Payer: BC Managed Care – PPO | Source: Ambulatory Visit | Attending: Radiation Oncology | Admitting: Radiation Oncology

## 2019-05-20 ENCOUNTER — Other Ambulatory Visit: Payer: Self-pay

## 2019-05-20 DIAGNOSIS — D0511 Intraductal carcinoma in situ of right breast: Secondary | ICD-10-CM | POA: Diagnosis not present

## 2019-05-20 DIAGNOSIS — Z510 Encounter for antineoplastic radiation therapy: Secondary | ICD-10-CM | POA: Diagnosis not present

## 2019-05-20 NOTE — Progress Notes (Signed)
Joann Meadows Progress Notes  Joann Meadows [id number]  - [facility].  Referral received from Dr Isidore Moos to assess for emotional support needs.  Was noted to be tearful and anxious at last visit - current diagnosis and treatment for DCIS is bringing back uncomfortable memories of previous treatment for AML, as well as death of mother from cancer when she was a child.  At the forefront is her concern for her children: "I am very very worried about my future, this is my second time, I have two small children, I worry about them, they ask if I will die".  "My mother died when I was a child from cancer, I am constantly worried."  Children are ages  53.5 and 71 yo - had difficulty getting pregnant.  "Took me a long time to get pregnant, Ive done so much to get pregnant, and now I have cancer."  In general, finds it difficult to move her mind off her DCIS diagnosis and fear of cancer/death/loss. Describes near constant battle w anxiety and low mood.  Has also had specific incident of overwhelming anxiety which included nausea, shaking, significant fatigue such that she needed to call family to care for the children as she was unable to do so.  "Now I live in fear of this happening again to me."  Has lost interest in "many things", anhedonic, guilty, constant worry/anxiety.  "I am a different person emotionally."  Has withdrawn from contact with family and usual sources of support.  Family lives in [place], usually goes to [place] for the summer months, "this is also affecting me" as she is unable to travel abroad as she usually does.  Discussed need to find ways to cope w anxiety/depression as well as trauma and grief/loss stemming from past cancer treatment/loss of mother to cancer. Will connect w [place] guide and refer to community therapist experienced w trauma.  Suggested contact w PCP for medication support if desired, pt wants to try nonpharmacological support first.  Edwyna Shell, [place] Worker Phone:  [phone]

## 2019-05-20 NOTE — Progress Notes (Signed)
[facility] CSW Progress Notes  Referral submitted for Hotel manager.  Mailed [place] information as patients email address on file was incorrect.  Edwyna Shell, LCSW Clinical Social Worker Phone:  [phone]

## 2019-05-21 ENCOUNTER — Other Ambulatory Visit: Payer: Self-pay

## 2019-05-21 ENCOUNTER — Ambulatory Visit
Admission: RE | Admit: 2019-05-21 | Discharge: 2019-05-21 | Disposition: A | Discharge status: Home / Self Care | Payer: BC Managed Care – PPO | Source: Ambulatory Visit | Attending: Radiation Oncology | Admitting: Radiation Oncology

## 2019-05-21 DIAGNOSIS — F32A Depression, unspecified: Secondary | ICD-10-CM

## 2019-05-21 DIAGNOSIS — Z510 Encounter for antineoplastic radiation therapy: Secondary | ICD-10-CM | POA: Diagnosis not present

## 2019-05-21 DIAGNOSIS — D0511 Intraductal carcinoma in situ of right breast: Secondary | ICD-10-CM

## 2019-05-21 DIAGNOSIS — F329 Major depressive disorder, single episode, unspecified: Secondary | ICD-10-CM

## 2019-05-21 NOTE — Progress Notes (Signed)
Referral placed per Edwyna Shell, social work, recommendation to [employer].

## 2019-05-24 ENCOUNTER — Ambulatory Visit
Admission: RE | Admit: 2019-05-24 | Discharge: 2019-05-24 | Disposition: A | Discharge status: Home / Self Care | Payer: BC Managed Care – PPO | Source: Ambulatory Visit | Attending: Radiation Oncology | Admitting: Radiation Oncology

## 2019-05-24 ENCOUNTER — Other Ambulatory Visit: Payer: Self-pay

## 2019-05-24 DIAGNOSIS — Z510 Encounter for antineoplastic radiation therapy: Secondary | ICD-10-CM | POA: Diagnosis not present

## 2019-05-24 DIAGNOSIS — D0511 Intraductal carcinoma in situ of right breast: Secondary | ICD-10-CM

## 2019-05-24 MED ORDER — ALRA NON-METALLIC DEODORANT (RAD-ONC)
1.0000 "application " | Freq: Once | TOPICAL | Status: AC
  Administered 2019-05-24: 1 via TOPICAL

## 2019-05-25 ENCOUNTER — Other Ambulatory Visit: Payer: Self-pay

## 2019-05-25 ENCOUNTER — Ambulatory Visit
Admission: RE | Admit: 2019-05-25 | Discharge: 2019-05-25 | Disposition: A | Discharge status: Home / Self Care | Payer: BC Managed Care – PPO | Source: Ambulatory Visit | Attending: Radiation Oncology | Admitting: Radiation Oncology

## 2019-05-25 DIAGNOSIS — D0511 Intraductal carcinoma in situ of right breast: Secondary | ICD-10-CM | POA: Diagnosis not present

## 2019-05-25 DIAGNOSIS — Z510 Encounter for antineoplastic radiation therapy: Secondary | ICD-10-CM | POA: Diagnosis not present

## 2019-05-26 ENCOUNTER — Ambulatory Visit
Admission: RE | Admit: 2019-05-26 | Discharge: 2019-05-26 | Disposition: A | Discharge status: Home / Self Care | Payer: BC Managed Care – PPO | Source: Ambulatory Visit | Attending: Radiation Oncology | Admitting: Radiation Oncology

## 2019-05-26 DIAGNOSIS — D0511 Intraductal carcinoma in situ of right breast: Secondary | ICD-10-CM | POA: Diagnosis not present

## 2019-05-26 DIAGNOSIS — Z510 Encounter for antineoplastic radiation therapy: Secondary | ICD-10-CM | POA: Diagnosis not present

## 2019-05-27 ENCOUNTER — Other Ambulatory Visit: Payer: Self-pay

## 2019-05-27 ENCOUNTER — Ambulatory Visit
Admission: RE | Admit: 2019-05-27 | Discharge: 2019-05-27 | Disposition: A | Discharge status: Home / Self Care | Payer: BC Managed Care – PPO | Source: Ambulatory Visit | Attending: Radiation Oncology | Admitting: Radiation Oncology

## 2019-05-27 DIAGNOSIS — D0511 Intraductal carcinoma in situ of right breast: Secondary | ICD-10-CM | POA: Diagnosis not present

## 2019-05-27 DIAGNOSIS — Z510 Encounter for antineoplastic radiation therapy: Secondary | ICD-10-CM | POA: Diagnosis not present

## 2019-05-31 ENCOUNTER — Ambulatory Visit
Admission: RE | Admit: 2019-05-31 | Discharge: 2019-05-31 | Disposition: A | Discharge status: Home / Self Care | Payer: BC Managed Care – PPO | Source: Ambulatory Visit | Attending: Radiation Oncology | Admitting: Radiation Oncology

## 2019-05-31 DIAGNOSIS — D0511 Intraductal carcinoma in situ of right breast: Secondary | ICD-10-CM | POA: Diagnosis not present

## 2019-05-31 DIAGNOSIS — Z510 Encounter for antineoplastic radiation therapy: Secondary | ICD-10-CM | POA: Diagnosis not present

## 2019-06-01 ENCOUNTER — Ambulatory Visit
Admission: RE | Admit: 2019-06-01 | Discharge: 2019-06-01 | Disposition: A | Discharge status: Home / Self Care | Payer: BC Managed Care – PPO | Source: Ambulatory Visit | Attending: Radiation Oncology | Admitting: Radiation Oncology

## 2019-06-01 ENCOUNTER — Other Ambulatory Visit: Payer: Self-pay

## 2019-06-01 DIAGNOSIS — D0511 Intraductal carcinoma in situ of right breast: Secondary | ICD-10-CM | POA: Diagnosis not present

## 2019-06-01 DIAGNOSIS — Z510 Encounter for antineoplastic radiation therapy: Secondary | ICD-10-CM | POA: Diagnosis not present

## 2019-06-02 ENCOUNTER — Ambulatory Visit
Admission: RE | Admit: 2019-06-02 | Discharge: 2019-06-02 | Disposition: A | Discharge status: Home / Self Care | Payer: BC Managed Care – PPO | Source: Ambulatory Visit | Attending: Radiation Oncology | Admitting: Radiation Oncology

## 2019-06-02 DIAGNOSIS — Z510 Encounter for antineoplastic radiation therapy: Secondary | ICD-10-CM | POA: Diagnosis not present

## 2019-06-02 DIAGNOSIS — D0511 Intraductal carcinoma in situ of right breast: Secondary | ICD-10-CM | POA: Diagnosis not present

## 2019-06-03 ENCOUNTER — Other Ambulatory Visit: Payer: Self-pay

## 2019-06-03 ENCOUNTER — Ambulatory Visit
Admission: RE | Admit: 2019-06-03 | Discharge: 2019-06-03 | Disposition: A | Discharge status: Home / Self Care | Payer: BC Managed Care – PPO | Source: Ambulatory Visit | Attending: Radiation Oncology | Admitting: Radiation Oncology

## 2019-06-03 DIAGNOSIS — D0511 Intraductal carcinoma in situ of right breast: Secondary | ICD-10-CM | POA: Diagnosis not present

## 2019-06-03 DIAGNOSIS — Z510 Encounter for antineoplastic radiation therapy: Secondary | ICD-10-CM | POA: Diagnosis not present

## 2019-06-04 ENCOUNTER — Ambulatory Visit
Admission: RE | Admit: 2019-06-04 | Discharge: 2019-06-04 | Disposition: A | Discharge status: Home / Self Care | Payer: BC Managed Care – PPO | Source: Ambulatory Visit | Attending: Radiation Oncology | Admitting: Radiation Oncology

## 2019-06-04 DIAGNOSIS — Z510 Encounter for antineoplastic radiation therapy: Secondary | ICD-10-CM | POA: Diagnosis not present

## 2019-06-04 DIAGNOSIS — D0511 Intraductal carcinoma in situ of right breast: Secondary | ICD-10-CM | POA: Diagnosis not present

## 2019-06-07 ENCOUNTER — Ambulatory Visit: Payer: BC Managed Care – PPO | Admitting: Radiation Oncology

## 2019-06-07 ENCOUNTER — Other Ambulatory Visit: Payer: Self-pay | Admitting: Radiation Oncology

## 2019-06-07 ENCOUNTER — Ambulatory Visit
Admission: RE | Admit: 2019-06-07 | Discharge: 2019-06-07 | Disposition: A | Discharge status: Home / Self Care | Payer: BC Managed Care – PPO | Source: Ambulatory Visit | Attending: Radiation Oncology | Admitting: Radiation Oncology

## 2019-06-07 ENCOUNTER — Other Ambulatory Visit: Payer: Self-pay

## 2019-06-07 DIAGNOSIS — Z510 Encounter for antineoplastic radiation therapy: Secondary | ICD-10-CM | POA: Diagnosis not present

## 2019-06-07 DIAGNOSIS — D0511 Intraductal carcinoma in situ of right breast: Secondary | ICD-10-CM

## 2019-06-07 MED ORDER — RADIAPLEXRX EX GEL
Freq: Two times a day (BID) | CUTANEOUS | Status: DC
  Administered 2019-06-07: 16:00:00 via TOPICAL

## 2019-06-07 MED ORDER — LIDOCAINE 5 % EX OINT: 1.0000 "application " | TOPICAL_OINTMENT | Freq: Three times a day (TID) | CUTANEOUS | 0 refills | Status: DC | PRN

## 2019-06-08 ENCOUNTER — Ambulatory Visit
Admission: RE | Admit: 2019-06-08 | Discharge: 2019-06-08 | Disposition: A | Discharge status: Home / Self Care | Payer: BC Managed Care – PPO | Source: Ambulatory Visit | Attending: Radiation Oncology | Admitting: Radiation Oncology

## 2019-06-08 DIAGNOSIS — D0511 Intraductal carcinoma in situ of right breast: Secondary | ICD-10-CM | POA: Diagnosis not present

## 2019-06-08 DIAGNOSIS — Z510 Encounter for antineoplastic radiation therapy: Secondary | ICD-10-CM | POA: Diagnosis not present

## 2019-06-09 ENCOUNTER — Other Ambulatory Visit: Payer: Self-pay

## 2019-06-09 ENCOUNTER — Ambulatory Visit
Admission: RE | Admit: 2019-06-09 | Discharge: 2019-06-09 | Disposition: A | Discharge status: Home / Self Care | Payer: BC Managed Care – PPO | Source: Ambulatory Visit | Attending: Radiation Oncology | Admitting: Radiation Oncology

## 2019-06-09 DIAGNOSIS — D0511 Intraductal carcinoma in situ of right breast: Secondary | ICD-10-CM | POA: Diagnosis not present

## 2019-06-09 DIAGNOSIS — Z510 Encounter for antineoplastic radiation therapy: Secondary | ICD-10-CM | POA: Diagnosis not present

## 2019-06-10 ENCOUNTER — Ambulatory Visit
Admission: RE | Admit: 2019-06-10 | Discharge: 2019-06-10 | Disposition: A | Discharge status: Home / Self Care | Payer: BC Managed Care – PPO | Source: Ambulatory Visit | Attending: Radiation Oncology | Admitting: Radiation Oncology

## 2019-06-10 DIAGNOSIS — Z510 Encounter for antineoplastic radiation therapy: Secondary | ICD-10-CM | POA: Diagnosis not present

## 2019-06-10 DIAGNOSIS — D0511 Intraductal carcinoma in situ of right breast: Secondary | ICD-10-CM | POA: Diagnosis not present

## 2019-06-10 NOTE — Assessment & Plan Note (Signed)
02/11/19: RT lumpectomy: DICS Intermediate grade, Margins Neg, ER 90%, PR 70%; Lt Lumpectomy: UDH Stage TisNx Stage 0  Plan: 1. Adj RT started 05/24/2019 2. Adj Tamoxifen for 5 years  Patient took tamoxifen prior to radiation therapy.  She tolerated it very well. We will plan to continue tamoxifen for 5 years. Return to clinic in 3 months for survivorship care plan visit.

## 2019-06-11 ENCOUNTER — Ambulatory Visit
Admission: RE | Admit: 2019-06-11 | Discharge: 2019-06-11 | Disposition: A | Discharge status: Home / Self Care | Payer: BC Managed Care – PPO | Source: Ambulatory Visit | Attending: Radiation Oncology | Admitting: Radiation Oncology

## 2019-06-11 ENCOUNTER — Other Ambulatory Visit: Payer: Self-pay

## 2019-06-11 DIAGNOSIS — D0511 Intraductal carcinoma in situ of right breast: Secondary | ICD-10-CM | POA: Diagnosis not present

## 2019-06-11 DIAGNOSIS — Z510 Encounter for antineoplastic radiation therapy: Secondary | ICD-10-CM | POA: Diagnosis not present

## 2019-06-14 ENCOUNTER — Ambulatory Visit
Admission: RE | Admit: 2019-06-14 | Discharge: 2019-06-14 | Disposition: A | Discharge status: Home / Self Care | Payer: BC Managed Care – PPO | Source: Ambulatory Visit | Attending: Radiation Oncology | Admitting: Radiation Oncology

## 2019-06-14 DIAGNOSIS — D0511 Intraductal carcinoma in situ of right breast: Secondary | ICD-10-CM | POA: Diagnosis not present

## 2019-06-14 DIAGNOSIS — Z510 Encounter for antineoplastic radiation therapy: Secondary | ICD-10-CM | POA: Diagnosis not present

## 2019-06-15 ENCOUNTER — Emergency Department (HOSPITAL_COMMUNITY)
Admission: EM | Admit: 2019-06-15 | Discharge: 2019-06-15 | Disposition: A | Discharge status: Home / Self Care | Payer: BC Managed Care – PPO | Attending: Emergency Medicine | Admitting: Emergency Medicine

## 2019-06-15 ENCOUNTER — Encounter (HOSPITAL_COMMUNITY): Payer: Self-pay | Admitting: Pharmacy Technician

## 2019-06-15 ENCOUNTER — Other Ambulatory Visit: Payer: Self-pay | Admitting: *Deleted

## 2019-06-15 ENCOUNTER — Other Ambulatory Visit: Payer: Self-pay

## 2019-06-15 ENCOUNTER — Telehealth: Payer: Self-pay | Admitting: *Deleted

## 2019-06-15 ENCOUNTER — Ambulatory Visit: Payer: BC Managed Care – PPO

## 2019-06-15 ENCOUNTER — Emergency Department (HOSPITAL_COMMUNITY): Payer: BC Managed Care – PPO

## 2019-06-15 DIAGNOSIS — R200 Anesthesia of skin: Secondary | ICD-10-CM | POA: Insufficient documentation

## 2019-06-15 DIAGNOSIS — R739 Hyperglycemia, unspecified: Secondary | ICD-10-CM | POA: Diagnosis not present

## 2019-06-15 DIAGNOSIS — Z87891 Personal history of nicotine dependence: Secondary | ICD-10-CM | POA: Diagnosis not present

## 2019-06-15 DIAGNOSIS — R479 Unspecified speech disturbances: Secondary | ICD-10-CM | POA: Diagnosis not present

## 2019-06-15 DIAGNOSIS — C50919 Malignant neoplasm of unspecified site of unspecified female breast: Secondary | ICD-10-CM | POA: Diagnosis not present

## 2019-06-15 DIAGNOSIS — R531 Weakness: Secondary | ICD-10-CM | POA: Diagnosis not present

## 2019-06-15 DIAGNOSIS — Z853 Personal history of malignant neoplasm of breast: Secondary | ICD-10-CM | POA: Diagnosis not present

## 2019-06-15 DIAGNOSIS — R202 Paresthesia of skin: Secondary | ICD-10-CM | POA: Diagnosis not present

## 2019-06-15 DIAGNOSIS — Z03818 Encounter for observation for suspected exposure to other biological agents ruled out: Secondary | ICD-10-CM | POA: Insufficient documentation

## 2019-06-15 DIAGNOSIS — D0511 Intraductal carcinoma in situ of right breast: Secondary | ICD-10-CM

## 2019-06-15 DIAGNOSIS — R51 Headache: Secondary | ICD-10-CM | POA: Insufficient documentation

## 2019-06-15 DIAGNOSIS — Z856 Personal history of leukemia: Secondary | ICD-10-CM | POA: Insufficient documentation

## 2019-06-15 DIAGNOSIS — R519 Headache, unspecified: Secondary | ICD-10-CM

## 2019-06-15 DIAGNOSIS — R29810 Facial weakness: Secondary | ICD-10-CM | POA: Diagnosis not present

## 2019-06-15 LAB — COMPREHENSIVE METABOLIC PANEL
ALT: 15 U/L (ref 0–44)
AST: 14 U/L — ABNORMAL LOW (ref 15–41)
Albumin: 3.8 g/dL (ref 3.5–5.0)
Alkaline Phosphatase: 41 U/L (ref 38–126)
Anion gap: 5 (ref 5–15)
BUN: 15 mg/dL (ref 6–20)
CO2: 25 mmol/L (ref 22–32)
Calcium: 9 mg/dL (ref 8.9–10.3)
Chloride: 111 mmol/L (ref 98–111)
Creatinine, Ser: 0.74 mg/dL (ref 0.44–1.00)
GFR calc Af Amer: >60 mL/min (ref >60)
GFR calc non Af Amer: >60 mL/min (ref >60)
Glucose, Bld: 120 mg/dL — ABNORMAL HIGH (ref 70–99)
Potassium: 4.5 mmol/L (ref 3.5–5.1)
Sodium: 141 mmol/L (ref 135–145)
Total Bilirubin: 0.4 mg/dL (ref 0.3–1.2)
Total Protein: 6.6 g/dL (ref 6.5–8.1)

## 2019-06-15 LAB — I-STAT CHEM 8, ED
BUN: 15 mg/dL (ref 6–20)
Calcium, Ion: 1.23 mmol/L (ref 1.15–1.40)
Chloride: 109 mmol/L (ref 98–111)
Creatinine, Ser: 0.7 mg/dL (ref 0.44–1.00)
Glucose, Bld: 111 mg/dL — ABNORMAL HIGH (ref 70–99)
HCT: 41 % (ref 36.0–46.0)
Hemoglobin: 13.9 g/dL (ref 12.0–15.0)
Potassium: 3.9 mmol/L (ref 3.5–5.1)
Sodium: 143 mmol/L (ref 135–145)
TCO2: 22 mmol/L (ref 22–32)

## 2019-06-15 LAB — RAPID URINE DRUG SCREEN, HOSP PERFORMED
Amphetamines: NOT DETECTED
Barbiturates: NOT DETECTED
Benzodiazepines: NOT DETECTED
Cocaine: NOT DETECTED
Opiates: NOT DETECTED
Tetrahydrocannabinol: NOT DETECTED

## 2019-06-15 LAB — APTT: aPTT: 31 seconds (ref 24–36)

## 2019-06-15 LAB — PROTIME-INR
INR: 1.1 (ref 0.8–1.2)
Prothrombin Time: 14.1 seconds (ref 11.4–15.2)

## 2019-06-15 LAB — CBC
HCT: 41.1 % (ref 36.0–46.0)
Hemoglobin: 13.3 g/dL (ref 12.0–15.0)
MCH: 30.3 pg (ref 26.0–34.0)
MCHC: 32.4 g/dL (ref 30.0–36.0)
MCV: 93.6 fL (ref 80.0–100.0)
Platelets: 206 10*3/uL (ref 150–400)
RBC: 4.39 MIL/uL (ref 3.87–5.11)
RDW: 12.8 % (ref 11.5–15.5)
WBC: 6 10*3/uL (ref 4.0–10.5)
nRBC: 0 % (ref 0.0–0.2)

## 2019-06-15 LAB — DIFFERENTIAL
Abs Immature Granulocytes: 0.03 10*3/uL (ref 0.00–0.07)
Basophils Absolute: 0 10*3/uL (ref 0.0–0.1)
Basophils Relative: 1 %
Eosinophils Absolute: 0.1 10*3/uL (ref 0.0–0.5)
Eosinophils Relative: 2 %
Immature Granulocytes: 1 %
Lymphocytes Relative: 17 %
Lymphs Abs: 1 10*3/uL (ref 0.7–4.0)
Monocytes Absolute: 0.4 10*3/uL (ref 0.1–1.0)
Monocytes Relative: 7 %
Neutro Abs: 4.3 10*3/uL (ref 1.7–7.7)
Neutrophils Relative %: 72 %

## 2019-06-15 LAB — URINALYSIS, ROUTINE W REFLEX MICROSCOPIC
Bilirubin Urine: NEGATIVE
Glucose, UA: NEGATIVE mg/dL
Hgb urine dipstick: NEGATIVE
Ketones, ur: NEGATIVE mg/dL
Leukocytes,Ua: NEGATIVE
Nitrite: NEGATIVE
Protein, ur: NEGATIVE mg/dL
Specific Gravity, Urine: 1.008 (ref 1.005–1.030)
pH: 7 (ref 5.0–8.0)

## 2019-06-15 LAB — ETHANOL: Alcohol, Ethyl (B): <10 mg/dL (ref <10)

## 2019-06-15 LAB — SARS CORONAVIRUS 2 BY RT PCR (HOSPITAL ORDER, PERFORMED IN ~~LOC~~ HOSPITAL LAB): SARS Coronavirus 2: NEGATIVE

## 2019-06-15 LAB — I-STAT BETA HCG BLOOD, ED (MC, WL, AP ONLY): I-stat hCG, quantitative: <5 m[IU]/mL (ref <5)

## 2019-06-15 MED ORDER — SODIUM CHLORIDE 0.9 % IV BOLUS
1000.0000 mL | Freq: Once | INTRAVENOUS | Status: AC
  Administered 2019-06-15: 1000 mL via INTRAVENOUS

## 2019-06-15 MED ORDER — IOHEXOL 350 MG/ML SOLN
100.0000 mL | Freq: Once | INTRAVENOUS | Status: AC | PRN
  Administered 2019-06-15: 100 mL via INTRAVENOUS

## 2019-06-15 NOTE — Discharge Instructions (Addendum)
You need to call your PCP to schedule an outpatient echocardiogram.  Please follow-up with neurology outpatient.  Please start taking 324 mg baby aspirin daily.  Return to the emergency department for any new worsening symptoms.

## 2019-06-15 NOTE — ED Triage Notes (Signed)
Pt being treated for breast cancer. States hard to get her words out because her "tongue is stuck" for approx 1 week. States today at approx 1200 the R side of her face was numb. Numbness to face is resolved at this time. Also reports R Knee felt weak today.

## 2019-06-15 NOTE — Consult Note (Addendum)
Neurology Consultation  Reason for Consult: Right-sided weakness and decreased sensation Referring Physician: Melina Copa    History is obtained from: Patient  HPI: Joann Meadows is a 43 y.o. female with history of leukemia, fibroids, endometriosis, breast cancer who is receiving her last radiation treatment tomorrow.  Patient states that 3 weeks ago she had a period in which she felt lethargic and weak all over.  This was also accompanied by a tingling sensation throughout her whole body.  This lasted for about a day and then dissipated.  Today she awoke after she had taken a nap at 12 and noted that her right face, right arm felt as I decrease sensation.  Patient called her physician and was told to go to the ED secondary to possible stroke.  She states that she went to the shower and felt as though her right knee could not hold her up.  Currently she states that all of her symptoms have dissipated other than decreased sensation in her right leg.  She also notes that over the past few weeks she feels as though she is having a hard time getting her words out at times.   ED course at this point time patient has been brought to her room and awaiting MRI of brain and CT Angie of head and neck.   LKW: 12 PM tpa given?: no, minimal symptoms Premorbid modified Rankin scale (mRS): 0 NIH stroke scale 1   ROS: A 14 point ROS was performed and is negative except as noted in the HPI.   Past Medical History:  Diagnosis Date  . Blood transfusion without reported diagnosis    leaukemia 18 years ago  . Breast cancer ([place])   . Cholestasis during pregnancy   . Endometriosis   . Family history of colon cancer   . Family history of leukemia   . Fibroid   . Leukemia ([place])   . PONV (postoperative nausea and vomiting)    pt states she vomits after colonoscopies     Family History  Problem Relation Age of Onset  . Colon cancer Mother 4       d. 86  . Hypertension Father   . Diabetes Maternal Aunt    . Diabetes Maternal Uncle   . Mental retardation Maternal Uncle   . Diabetes Paternal Aunt   . Diabetes Maternal Grandmother   . Stroke Paternal Grandmother        c. 29  . Leukemia Paternal Grandfather        d. 66; ?CLL  . Leukemia Cousin 4       mat first cousin  . Lymphoma Cousin 83       pat first cousin     Social History:   reports that she quit smoking about 4 years ago. Her smoking use included cigarettes. She quit after 10.00 years of use. She has never used smokeless tobacco. She reports that she does not drink alcohol or use drugs.  Medications  Current Facility-Administered Medications:  .  sodium chloride 0.9 % bolus 1,000 mL, 1,000 mL, Intravenous, Once, Henderly, Britni A, PA-C  Current Outpatient Medications:  .  acetaminophen (TYLENOL) 325 MG tablet, Take 650 mg by mouth every 6 (six) hours as needed for mild pain. , Disp: , Rfl:  .  tamoxifen (NOLVADEX) 20 MG tablet, Take 1 tablet (20 mg total) by mouth daily. (Patient not taking: Reported on 06/15/2019), Disp: 90 tablet, Rfl: 3   Exam: Current vital signs: BP 129/84   Pulse  84   Temp 98.2 F (36.8 C) (Oral)   Ht 5\' 5"  (1.651 m)   Wt 68 kg   SpO2 100%   BMI 24.96 kg/m  Vital signs in last 24 hours: Temp:  [98.2 F (36.8 C)] 98.2 F (36.8 C) (07/21 1512) Pulse Rate:  [84-95] 84 (07/21 1545) BP: (129-151)/(84-107) 129/84 (07/21 1545) SpO2:  [96 %-100 %] 100 % (07/21 1545) Weight:  [68 kg] 68 kg (07/21 1500)  Physical Exam  Constitutional: Appears well-developed and well-nourished.  Psych: Affect appropriate to situation Eyes: No scleral injection HENT: No OP obstrucion Head: Normocephalic.  Cardiovascular: Normal rate and regular rhythm.  Respiratory: Effort normal, non-labored breathing GI: Soft.  No distension. There is no tenderness.  Skin: WDI  Neuro: Mental Status: Patient is awake, alert, oriented to person, place, month, year, and situation. Patient is able to give a clear and  coherent history. Patient is sometimes slow to answer questions but does not show any aphasia or dysarthria Cranial Nerves: II: Visual Fields are full.  III,IV, VI: EOMI without ptosis or diploplia. Pupils equal, round and reactive to light V: Facial sensation is symmetric to temperature VII: Facial movement is symmetric.  VIII: hearing is intact to voice X: Palat elevates symmetrically XI: Shoulder shrug is symmetric. XII: tongue is midline without atrophy or fasciculations.  Motor: Tone is normal. Bulk is normal. 5/5 strength was present in all four extremities.  Sensory: Decreased sensation on her right leg otherwise negative Deep Tendon Reflexes: 2+ and symmetric in the biceps and patellae.  Plantars: Mute bilaterally Cerebellar: FNF and HKS are intact bilaterally  Labs I have reviewed labs in epic and the results pertinent to this consultation are:   CBC    Component Value Date/Time   WBC 6.0 06/15/2019 1530   RBC 4.39 06/15/2019 1530   HGB 13.9 06/15/2019 1535   HCT 41.0 06/15/2019 1535   PLT 206 06/15/2019 1530   MCV 93.6 06/15/2019 1530   MCH 30.3 06/15/2019 1530   MCHC 32.4 06/15/2019 1530   RDW 12.8 06/15/2019 1530   LYMPHSABS 1.0 06/15/2019 1530   MONOABS 0.4 06/15/2019 1530   EOSABS 0.1 06/15/2019 1530   BASOSABS 0.0 06/15/2019 1530    CMP     Component Value Date/Time   NA 143 06/15/2019 1535   K 3.9 06/15/2019 1535   CL 109 06/15/2019 1535   CO2 25 06/15/2019 1530   GLUCOSE 111 (H) 06/15/2019 1535   BUN 15 06/15/2019 1535   CREATININE 0.70 06/15/2019 1535   CALCIUM 9.0 06/15/2019 1530   PROT 6.6 06/15/2019 1530   ALBUMIN 3.8 06/15/2019 1530   AST 14 (L) 06/15/2019 1530   ALT 15 06/15/2019 1530   ALKPHOS 41 06/15/2019 1530   BILITOT 0.4 06/15/2019 1530   GFRNONAA >60 06/15/2019 1530   GFRAA >60 06/15/2019 1530    Lipid Panel  No results found for: CHOL, TRIG, HDL, CHOLHDL, VLDL, LDLCALC, LDLDIRECT   Imaging I have reviewed the images  obtained:  CTA of head and neck-pending  MRI examination of the brain-pending  Etta Quill PA-C [facility] [phone]  M-F  (9:00 am- 5:00 PM)  06/15/2019, 4:51 PM    ASSESSMENT AND PLAN  43 year old female with past medical history of breast cancer presents with headache, paresthesia right side of her face as well as right arm and leg weakness onset approximately 2 hours.  Patient has been having recurrent headaches for the last 1 month which is new for her.  1 month ago she also had episode of bilateral facial numbness as well as generalized body weakness.  Her headache is associated with nausea and photophobia on occasion, denies any visual auras.  On examination patient has no cranial nerve deficits.  No motor weakness.  Complains of mild reduced sensation on the outer aspect of the left leg.  She is alert oriented and does not seem to be much distress, slightly anxious.  Transient ischemic attack versus complicated migraine   Recommendations MRI brain without contrast to rule out acute stroke was CT angiogram to look for any intracranial or extracranial atherosclerosis If negative, administer migraine cocktail and start patient on Topamax 25mg  BID daily Outpatient echo neurology follow-up in 2 weeks Counseled on stroke symptoms and need to come to emergency room if patient has warning signs of stroke.

## 2019-06-15 NOTE — ED Notes (Signed)
Patient Alert and oriented to baseline. Stable and ambulatory to baseline. Patient verbalized understanding of the discharge instructions.  Patient belongings were taken by the patient.   

## 2019-06-15 NOTE — Telephone Encounter (Signed)
Received call from pt stating that for several days she has been experiencing numbness and tingling in the right side of her face and trouble speaking.  RN spoke with Sandi Mealy, PA who stated pt needs to go straight to the emergency department.  Pt notified to go directly to [facility] ED.  Pt verbalized understanding.   RN called charge Psychologist, occupational at [facility] to let her be aware that pt is on their way.

## 2019-06-15 NOTE — Progress Notes (Signed)
Patient Care Team: [name], L.Marlou Sa, MD as PCP - General (Family Medicine) Rockwell Germany, RN as Oncology Nurse Navigator [name], RN as Oncology Nurse Navigator  DIAGNOSIS:    ICD-10-CM   1. Ductal carcinoma in situ (DCIS) of right breast  D05.11     SUMMARY OF ONCOLOGIC HISTORY: Oncology History  Ductal carcinoma in situ (DCIS) of right breast  01/25/2019 Initial Diagnosis   Screening mammogram showed bilateral calcifications right breast 5 mm, pleomorphic.  Left breast 1.6 cm punctate, smaller group of calcifications spanning 3 mm.  Right lower outer quadrant biopsy revealed low-grade DCIS ER 90%, PR 70%; biopsy of left breast calcifications ALH and PASH   01/31/2019 Genetic Testing   ATM c.8495G>A and RECQL4 c.2587G>A VUS identified on the 9-gene STAT panel and Multi-cancer panel through [facility].  The STAT Breast cancer panel offered by [facility] includes sequencing and rearrangement analysis for the following 9 genes:  ATM, BRCA1, BRCA2, CDH1, CHEK2, PALB2, PTEN, STK11 and TP53.   The Multi-Gene Panel offered by [facility] includes sequencing and/or deletion duplication testing of the following 84 genes: AIP, ALK, APC, ATM, AXIN2,BAP1,  BARD1, BLM, BMPR1A, BRCA1, BRCA2, BRIP1, CASR, CDC73, CDH1, CDK4, CDKN1B, CDKN1C, CDKN2A (p14ARF), CDKN2A (p16INK4a), CEBPA, CHEK2, CTNNA1, DICER1, DIS3L2, EGFR (c.2369C>T, p.Thr790Met variant only), EPCAM (Deletion/duplication testing only), FH, FLCN, GATA2, GPC3, GREM1 (Promoter region deletion/duplication testing only), HOXB13 (c.251G>A, p.Gly84Glu), HRAS, KIT, MAX, MEN1, MET, MITF (c.952G>A, p.Glu318Lys variant only), MLH1, MSH2, MSH3, MSH6, MUTYH, NBN, NF1, NF2, NTHL1, PALB2, PDGFRA, PHOX2B, PMS2, POLD1, POLE, POT1, PRKAR1A, PTCH1, PTEN, RAD50, RAD51C, RAD51D, RB1, RECQL4, RET, RUNX1, SDHAF2, SDHA (sequence changes only), SDHB, SDHC, SDHD, SMAD4, SMARCA4, SMARCB1, SMARCE1, STK11, SUFU, TERC, TERT, TMEM127, TP53, TSC1, TSC2, VHL, WRN and WT1.  he report  date is February 02, 2019.   02/11/2019 Surgery   RT lumpectomy: DICS Intermediate grade, Margins Neg, ER 90%, PR 70%; Lt Lumpectomy: UDH   02/26/2019 -  Anti-estrogen oral therapy   Tamoxifen 23m daily, plan for 5 years   05/20/2019 -  Radiation Therapy   Adjuvant XRT   [place]   [place] 2 induction regimens followed by 6 consolidation regimens, remission     CHIEF COMPLIANT: Follow-up after radiation to discuss further treatment  INTERVAL HISTORY: LTamre Cassis a 43y.o. with above-mentioned history of right breast DCIS for which she underwent lumpectomy and is currently receiving radiation. She is currently on anti-estrogen therapy with tamoxifen. She presents to the clinic today for follow-up.  Tomorrow is her last radiation therapy.  She has been feeling extremely fatigued and tired and also sunburn-like skin rash.  She went to the emergency room yesterday because of speech difficulties as well as numbness of the right side of the face.  Brain MRI and CT head were negative.  She was asked to follow-up with neurology and cardiology and also to start aspirin daily.  REVIEW OF SYSTEMS:   Constitutional: Denies fevers, chills or abnormal weight loss Eyes: Denies blurriness of vision Ears, nose, mouth, throat, and face: Denies mucositis or sore throat Respiratory: Denies cough, dyspnea or wheezes Cardiovascular: Denies palpitation, chest discomfort Gastrointestinal: Denies nausea, heartburn or change in bowel habits Skin: Denies abnormal skin rashes Lymphatics: Denies new lymphadenopathy or easy bruising Neurological: Difficulty with articulation and speech, right-sided facial numbness Behavioral/Psych: Mood is stable, no new changes  Extremities: No lower extremity edema Breast: denies any pain or lumps or nodules  in either breasts All other systems were reviewed with the patient and are  negative.  I have reviewed the past medical history, past surgical history, social history and family history with the patient and they are unchanged from previous note.  ALLERGIES:  has No Known Allergies.  MEDICATIONS:  Current Outpatient Medications  Medication Sig Dispense Refill   acetaminophen (TYLENOL) 325 MG tablet Take 650 mg by mouth every 6 (six) hours as needed for mild pain.      tamoxifen (NOLVADEX) 20 MG tablet Take 1 tablet (20 mg total) by mouth daily. (Patient not taking: Reported on 06/15/2019) 90 tablet 3   No current facility-administered medications for this visit.     PHYSICAL EXAMINATION: ECOG PERFORMANCE STATUS: 1 - Symptomatic but completely ambulatory  Vitals:   06/16/19 1450  BP: 119/76  Pulse: 90  Resp: 17  Temp: 98.7 F (37.1 C)  SpO2: 100%   Filed Weights   06/16/19 1450  Weight: 156 lb 4.8 oz (70.9 kg)    GENERAL: alert, no distress and comfortable SKIN: skin color, texture, turgor are normal, no rashes or significant lesions EYES: normal, Conjunctiva are pink and non-injected, sclera clear OROPHARYNX: no exudate, no erythema and lips, buccal mucosa, and tongue normal  NECK: supple, thyroid normal size, non-tender, without nodularity LYMPH: no palpable lymphadenopathy in the cervical, axillary or inguinal LUNGS: clear to auscultation and percussion with normal breathing effort HEART: regular rate & rhythm and no murmurs and no lower extremity edema ABDOMEN: abdomen soft, non-tender and normal bowel sounds MUSCULOSKELETAL: no cyanosis of digits and no clubbing  NEURO: Numbness of the face and difficulty with articulation and speech EXTREMITIES: No lower extremity edema  LABORATORY DATA:  I have reviewed the data as listed CMP Latest Ref Rng & Units 06/15/2019 06/15/2019 03/01/2017  Glucose 70 - 99 mg/dL 111(H) 120(H) 91  BUN 6 - 20 mg/dL _0 Creatinine 0.44 - 1.00 mg/dL 0.70 0.74 0.56  Sodium 135 - 145 mmol/L 143 141 136  Potassium  3.5 - 5.1 mmol/L 3.9 4.5 3.7  Chloride 98 - 111 mmol/L 109 111 108  CO2 22 - 32 mmol/L - 25 20(L)  Calcium 8.9 - 10.3 mg/dL - 9.0 8.0(L)  Total Protein 6.5 - 8.1 g/dL - 6.6 5.8(L)  Total Bilirubin 0.3 - 1.2 mg/dL - 0.4 0.2(L)  Alkaline Phos 38 - 126 U/L - 41 63  AST 15 - 41 U/L - 14(L) 16  ALT 0 - 44 U/L - 15 16    Lab Results  Component Value Date   WBC 6.5 06/16/2019   HGB 12.9 06/16/2019   HCT 40.0 06/16/2019   MCV 93.5 06/16/2019   PLT 207 06/16/2019   NEUTROABS 4.3 06/16/2019    ASSESSMENT & PLAN:  Ductal carcinoma in situ (DCIS) of right breast 02/11/19: RT lumpectomy: DICS Intermediate grade, Margins Neg, ER 90%, PR 70%; Lt Lumpectomy: UDH Stage TisNx Stage 0  Plan: 1. Adj RT started 05/24/2019 2. Adj Tamoxifen for 5 years  She started tamoxifen prior to radiation and that she could not tolerate it.  She felt depressed, had one episode of numbness of the face.  Instructed her to stop it at that time.  Emergency room visit for numbness of the face and difficulty with articulation of speech.  CT head and brain MRI were negative.  She was asked to take aspirin and get a cardiology evaluation as well as neurology evaluation.  I will see her back in 3 months  to recheck on how she is doing with overall symptoms.  We will consider starting her on tamoxifen at 5 mg daily if her symptoms have resolved.  We discussed with her the risks and benefits of adjuvant tamoxifen in DCIS.  3 months virtual visit    No orders of the defined types were placed in this encounter.  The patient has a good understanding of the overall plan. she agrees with it. she will call with any problems that may develop before the next visit here.  Nicholas Lose, MD 06/16/2019  Julious Oka Dorshimer am acting as scribe for Dr. Nicholas Lose.  I have reviewed the above documentation for accuracy and completeness, and I agree with the above.

## 2019-06-15 NOTE — ED Provider Notes (Signed)
[facility] EMERGENCY DEPARTMENT Provider Note   CSN: [id number] Arrival date & time: 06/15/19  1449  History   Chief Complaint Numbness  HPI Joann Meadows is a 43 y.o. female with past medical history significant for ductal carcinoma currently undergoing radiation who presents for evaluation of difficulty with word finding and getting words out intermittently x 1 week, headache and generalized weakness x1 week. At 1200 today noticed 1 hour of right sided facial numbness and right upper and lower extremity weakness. No current symptoms.  Patient states she had similar symptoms 1 month ago when she was taking Taxol for her breast cancer.  Patient states her symptoms resolved after she discontinued her Taxol.  Is fever, chills, nausea, vomiting, current headache, sudden onset thunderclap headache, blurred vision, facial asymmetry, drooling, dysphasia, neck pain, neck stiffness, chest pain, shortness of breath decreased range of motion to extremities.  Denies prior history of hypertension, diabetes, prior CVA.  Has not taken anything for symptoms.  Denies additional aggravating or alleviating factors.  Current treatment for breast cancer with radiation.   History obtained from patient and past medical history. No interpretor was used.     HPI  Past Medical History:  Diagnosis Date   Blood transfusion without reported diagnosis    leaukemia 18 years ago   Breast cancer ([place])    Cholestasis during pregnancy    Endometriosis    Family history of colon cancer    Family history of leukemia    Fibroid    Leukemia ([place])    PONV (postoperative nausea and vomiting)    pt states she vomits after colonoscopies    Patient Active Problem List   Diagnosis Date Noted   Genetic testing 02/02/2019   Ductal carcinoma in situ (DCIS) of right breast 01/25/2019   AML (acute myeloid leukemia) ([place]) 01/25/2019   Family history of colon cancer    Family history of  leukemia    Delivery normal 03/25/2017    Past Surgical History:  Procedure Laterality Date   BREAST LUMPECTOMY WITH RADIOACTIVE SEED LOCALIZATION Bilateral 02/11/2019   Procedure: BILATERAL BREAST LUMPECTOMIES WITH BILATERAL RADIOACTIVE SEED LOCALIZATION;  Surgeon: Donnie Mesa, MD;  Location: [facility];  Service: General;  Laterality: Bilateral;   NO PAST SURGERIES       OB History    Gravida  2   Para  2   Term  2   Preterm      AB      Living  2     SAB      TAB      Ectopic      Multiple  0   Live Births  2            Home Medications    Prior to Admission medications   Medication Sig Start Date End Date Taking? Authorizing Provider  acetaminophen (TYLENOL) 325 MG tablet Take 650 mg by mouth every 6 (six) hours as needed for mild pain.    Yes [provider]  tamoxifen (NOLVADEX) 20 MG tablet Take 1 tablet (20 mg total) by mouth daily. Patient not taking: Reported on 06/15/2019 02/26/19   Nicholas Lose, MD    Family History Family History  Problem Relation Age of Onset   Colon cancer Mother 36       d. 38   Hypertension Father    Diabetes Maternal Aunt    Diabetes Maternal Uncle    Mental retardation Maternal Uncle  Diabetes Paternal Aunt    Diabetes Maternal Grandmother    Stroke Paternal Grandmother        c. 67   Leukemia Paternal Grandfather        d. 63; ?CLL   Leukemia Cousin 4       mat first cousin   Lymphoma Cousin 48       pat first cousin    Social History Social History   Tobacco Use   Smoking status: Former Smoker    Years: 10.00    Types: Cigarettes    Quit date: 01/25/2015    Years since quitting: 4.3   Smokeless tobacco: Never Used  Substance Use Topics   Alcohol use: No   Drug use: No     Allergies   Patient has no known allergies.   Review of Systems Review of Systems  Constitutional: Negative.   HENT: Negative.   Respiratory: Negative.   Cardiovascular:  Negative.   Gastrointestinal: Negative.   Genitourinary: Negative.   Musculoskeletal: Negative.   Skin: Negative.   Neurological: Positive for speech difficulty, weakness, numbness and headaches. Negative for dizziness, tremors, seizures, syncope, facial asymmetry and light-headedness.  All other systems reviewed and are negative.   Physical Exam Updated Vital Signs BP (!) 140/95    Pulse 84    Temp 98.2 F (36.8 C) (Oral)    Resp 14    Ht 5\' 5"  (1.651 m)    Wt 68 kg    SpO2 100%    BMI 24.96 kg/m   Physical Exam  Physical Exam  Constitutional: Pt is oriented to person, place, and time. Pt appears well-developed and well-nourished. No distress.  HENT:  Head: Normocephalic and atraumatic.  Mouth/Throat: Oropharynx is clear and moist.  Eyes: Conjunctivae and EOM are normal. Pupils are equal, round, and reactive to light. No scleral icterus.  No horizontal, vertical or rotational nystagmus  Neck: Normal range of motion. Neck supple.  Full active and passive ROM without pain No midline or paraspinal tenderness No nuchal rigidity or meningeal signs  Cardiovascular: Normal rate, regular rhythm and intact distal pulses.   Pulmonary/Chest: Effort normal and breath sounds normal. No respiratory distress. Pt has no wheezes. No rales.  Abdominal: Soft. Bowel sounds are normal. There is no tenderness. There is no rebound and no guarding.  Musculoskeletal: Normal range of motion.  Lymphadenopathy:    No cervical adenopathy.  Neurological: Pt. is alert and oriented to person, place, and time. He has normal reflexes. No cranial nerve deficit.  Exhibits normal muscle tone. Coordination normal.  Mental Status:  Alert, oriented, thought content appropriate. Speech fluent without evidence of aphasia. Able to follow 2 step commands without difficulty.  Cranial Nerves:  II:  Peripheral visual fields grossly normal, pupils equal, round, reactive to light III,IV, VI: ptosis not present, extra-ocular  motions intact bilaterally  V,VII: smile symmetric, facial light touch sensation equal VIII: hearing grossly normal bilaterally  IX,X: midline uvula rise  XI: bilateral shoulder shrug equal and strong XII: midline tongue extension  Motor:  5/5 in upper and lower extremities bilaterally including strong and equal grip strength and dorsiflexion/plantar flexion Sensory: Pinprick and light touch normal in all extremities.  Deep Tendon Reflexes: 2+ and symmetric  Cerebellar: normal finger-to-nose with bilateral upper extremities Gait: normal gait and balance CV: distal pulses palpable throughout   Skin: Skin is warm and dry. No rash noted. Pt is not diaphoretic.  Psychiatric: Pt has a normal mood and affect. Behavior is normal.  Judgment and thought content normal.  Nursing note and vitals reviewed. ED Treatments / Results  Labs (all labs ordered are listed, but only abnormal results are displayed) Labs Reviewed  COMPREHENSIVE METABOLIC PANEL - Abnormal; Notable for the following components:      Result Value   Glucose, Bld 120 (*)    AST 14 (*)    All other components within normal limits  URINALYSIS, ROUTINE W REFLEX MICROSCOPIC - Abnormal; Notable for the following components:   Color, Urine STRAW (*)    All other components within normal limits  I-STAT CHEM 8, ED - Abnormal; Notable for the following components:   Glucose, Bld 111 (*)    All other components within normal limits  SARS CORONAVIRUS 2 (HOSPITAL ORDER, [facility])  ETHANOL  PROTIME-INR  APTT  CBC  DIFFERENTIAL  RAPID URINE DRUG SCREEN, HOSP PERFORMED  I-STAT BETA HCG BLOOD, ED ([facility], [facility], [facility] ONLY)    EKG EKG Interpretation  Date/Time:  Tuesday June 15 2019 15:58:58 EDT Ventricular Rate:  90 PR Interval:    QRS Duration: 88 QT Interval:  355 QTC Calculation: 435 R Axis:   68 Text Interpretation:  Sinus rhythm RSR' in V1 or V2, probably normal variant no prior to compare with  Confirmed by Aletta Edouard [phone]) on 06/15/2019 4:04:16 PM   Radiology Ct Angio Head W Or Wo Contrast  Result Date: 06/15/2019 CLINICAL DATA:  Breast cancer patient with speech disturbance over the last week. Right-sided facial numbness. EXAM: CT ANGIOGRAPHY HEAD AND NECK TECHNIQUE: Multidetector CT imaging of the head and neck was performed using the standard protocol during bolus administration of intravenous contrast. Multiplanar CT image reconstructions and MIPs were obtained to evaluate the vascular anatomy. Carotid stenosis measurements (when applicable) are obtained utilizing NASCET criteria, using the distal internal carotid diameter as the denominator. CONTRAST:  147mL OMNIPAQUE IOHEXOL 350 MG/ML SOLN COMPARISON:  None. FINDINGS: CT HEAD FINDINGS Brain: The brain shows a normal appearance without evidence of malformation, atrophy, old or acute small or large vessel infarction, mass lesion, hemorrhage, hydrocephalus or extra-axial collection. Vascular: No hyperdense vessel. No evidence of atherosclerotic calcification. Skull: Normal.  No traumatic finding.  No focal bone lesion. Sinuses/Orbits: Sinuses are clear. Orbits appear normal. Mastoids are clear. Other: None significant CTA NECK FINDINGS Aortic arch: Normal Right carotid system: Common carotid artery widely patent to the bifurcation. No soft or calcified plaque at the carotid bifurcation. Cervical ICA is normal. Left carotid system: Common carotid artery widely patent to the bifurcation. No soft or calcified plaque at the carotid bifurcation. Cervical ICA is normal. Vertebral arteries: Both vertebral arteries are widely patent at their origins and through the cervical region to the foramen magnum. Skeleton: Normal Other neck: No mass or lymphadenopathy. Upper chest: Normal Review of the MIP images confirms the above findings CTA HEAD FINDINGS Anterior circulation: Both internal carotid arteries are widely patent through the skull base and  siphon regions. The anterior and middle cerebral vessels are patent without stenosis, aneurysm or vascular malformation. No vascular occlusion. Posterior circulation: Both vertebral arteries are widely patent to the basilar. No basilar stenosis. Posterior circulation branch vessels are normal. Venous sinuses: Patent and normal. Anatomic variants: None Delayed phase: Not performed. Review of the MIP images confirms the above findings IMPRESSION: Normal examinations. Normal CT appearance of the brain. Normal CT angiogram appearance of the cerebrovascular system. Electronically Signed   By: Nelson Chimes M.D.   On: 06/15/2019 18:16   Ct  Angio Neck W And/or Wo Contrast  Result Date: 06/15/2019 CLINICAL DATA:  Breast cancer patient with speech disturbance over the last week. Right-sided facial numbness. EXAM: CT ANGIOGRAPHY HEAD AND NECK TECHNIQUE: Multidetector CT imaging of the head and neck was performed using the standard protocol during bolus administration of intravenous contrast. Multiplanar CT image reconstructions and MIPs were obtained to evaluate the vascular anatomy. Carotid stenosis measurements (when applicable) are obtained utilizing NASCET criteria, using the distal internal carotid diameter as the denominator. CONTRAST:  158mL OMNIPAQUE IOHEXOL 350 MG/ML SOLN COMPARISON:  None. FINDINGS: CT HEAD FINDINGS Brain: The brain shows a normal appearance without evidence of malformation, atrophy, old or acute small or large vessel infarction, mass lesion, hemorrhage, hydrocephalus or extra-axial collection. Vascular: No hyperdense vessel. No evidence of atherosclerotic calcification. Skull: Normal.  No traumatic finding.  No focal bone lesion. Sinuses/Orbits: Sinuses are clear. Orbits appear normal. Mastoids are clear. Other: None significant CTA NECK FINDINGS Aortic arch: Normal Right carotid system: Common carotid artery widely patent to the bifurcation. No soft or calcified plaque at the carotid  bifurcation. Cervical ICA is normal. Left carotid system: Common carotid artery widely patent to the bifurcation. No soft or calcified plaque at the carotid bifurcation. Cervical ICA is normal. Vertebral arteries: Both vertebral arteries are widely patent at their origins and through the cervical region to the foramen magnum. Skeleton: Normal Other neck: No mass or lymphadenopathy. Upper chest: Normal Review of the MIP images confirms the above findings CTA HEAD FINDINGS Anterior circulation: Both internal carotid arteries are widely patent through the skull base and siphon regions. The anterior and middle cerebral vessels are patent without stenosis, aneurysm or vascular malformation. No vascular occlusion. Posterior circulation: Both vertebral arteries are widely patent to the basilar. No basilar stenosis. Posterior circulation branch vessels are normal. Venous sinuses: Patent and normal. Anatomic variants: None Delayed phase: Not performed. Review of the MIP images confirms the above findings IMPRESSION: Normal examinations. Normal CT appearance of the brain. Normal CT angiogram appearance of the cerebrovascular system. Electronically Signed   By: Nelson Chimes M.D.   On: 06/15/2019 18:16   Mr Brain Wo Contrast  Result Date: 06/15/2019 CLINICAL DATA:  History of breast cancer. Lethargy and weakness. Tingling sensation. Right face and arm numbness. EXAM: MRI HEAD WITHOUT CONTRAST TECHNIQUE: Multiplanar, multiecho pulse sequences of the brain and surrounding structures were obtained without intravenous contrast. COMPARISON:  CT studies same day FINDINGS: Brain: The brain has a normal appearance without evidence of malformation, atrophy, old or acute small or large vessel infarction, mass lesion, hemorrhage, hydrocephalus or extra-axial collection. Vascular: Major vessels at the base of the brain show flow. Venous sinuses appear patent. Skull and upper cervical spine: Normal. Sinuses/Orbits: Sinuses are clear  except for an insignificant retention cyst of the sphenoid sinus. Other: None significant. IMPRESSION: Normal MRI brain examination. Electronically Signed   By: Nelson Chimes M.D.   On: 06/15/2019 19:00    Procedures Procedures (including critical care time)  Medications Ordered in ED Medications  sodium chloride 0.9 % bolus 1,000 mL (1,000 mLs Intravenous New Bag/Given 06/15/19 1905)  iohexol (OMNIPAQUE) 350 MG/ML injection 100 mL (100 mLs Intravenous Contrast Given 06/15/19 1756)    Initial Impression / Assessment and Plan / ED Course  I have reviewed the triage vital signs and the nursing notes.  Pertinent labs & imaging results that were available during my care of the patient were reviewed by me and considered in my medical decision making (see chart  for details).  43 year old female appears otherwise well presents for evaluation of numbness and weakness.  Patient with expressive aphasia intermittently over the last week intermittent headache and weakness.  Today at 12 PM experienced right-sided facial paresthesias as well as weakness to her right lower extremity which lasted 1 hour.  She has a normal neurologic exam without neurologic deficits.  No prior history of CVA, diabetes or hypertension.  Denies sudden onset thunderclap headache.  Been intermittent symptoms x1 week code stroke not called. Clinical Course as of Jun 14 1934  Tue Jun 15, 5967  4914 42 year old female with history of breast cancer currently getting local radiation.  She is complaining of 1 week of feeling difficulty speaking and today experiencing facial numbness along with some right arm numbness and some weakness in her right leg.  She also said she had an episode about a month ago of whole body tingling that her doctor recommended that she stop her tamoxifen and that seemed to settle down.  She is awake and alert with a normal exam here.  She is getting some basic lab work EKG head CT and a neurology consult.   [MB]     Clinical Course User Index [MB] Hayden Rasmussen, MD     1515: Consulted with Dr. Melina Copa attending physician who states no need to call code stroke at this time given length of symtpoms. Will need neuro consult.  1600: Consultedwith Dr. Lorraine Lax, Neurology.  Recommend CTA head, CTA neck MR brain WO and will evaluate patient.  Labs and imaging personally reviewed: CBC without leukocytosis CMP without electtrolyte, renal or liver abnormality I-STAT hcg negative Istat chem 8 negative COVID negative CTA head negative CTA neck negative MR brain WO negative Urinalysis negative for infection  1820: Consulted with neurology, Dr. Lorraine Lax who has evaluated patient.  If MRI negative can have outpatient follow-up for echo. Possible complex migraine as cause of symptoms. Will start on aspirin outpatient.  1920: Patient continues to be neurologically intact without any focal deficits.  Ambulatory in ED without difficulty. Patient needs outpatient transthoracic ECHO per Neurology recommendations.   Presentation non concerning for [facility], ICH, Meningitis, or temporal arteritis. Pt is afebrile with no focal neuro deficits, nuchal rigidity, or change in vision.  Low suspicion for CVA, dissection.  The patient has been appropriately medically screened and/or stabilized in the ED. I have low suspicion for any other emergent medical condition which would require further screening, evaluation or treatment in the ED or require inpatient management.  Patient is hemodynamically stable and in no acute distress.  Patient able to ambulate in department prior to ED.  Evaluation does not show acute pathology that would require ongoing or additional emergent interventions while in the emergency department or further inpatient treatment.  I have discussed the diagnosis with the patient and answered all questions.  Pain is been managed while in the emergency department and patient has no further complaints prior to discharge.   Patient is comfortable with plan discussed in room and is stable for discharge at this time.  I have discussed strict return precautions for returning to the emergency department.  Patient was encouraged to follow-up with PCP/specialist refer to at discharge.  Patient seen and evaluated today in the emergency department by attending physician, Dr. Melina Copa who agrees with the treatment, plan and disposition.  Final Clinical Impressions(s) / ED Diagnoses   Final diagnoses:  Paresthesias  Weakness  Acute nonintractable headache, unspecified headache type  Hyperglycemia    ED  Discharge Orders    None       Luqman Perrelli A, PA-C 06/15/19 1936    Hayden Rasmussen, MD 06/16/19 [phone]

## 2019-06-16 ENCOUNTER — Ambulatory Visit: Payer: BC Managed Care – PPO

## 2019-06-16 ENCOUNTER — Ambulatory Visit
Admission: RE | Admit: 2019-06-16 | Discharge: 2019-06-16 | Disposition: A | Discharge status: Home / Self Care | Payer: BC Managed Care – PPO | Source: Ambulatory Visit | Attending: Radiation Oncology | Admitting: Radiation Oncology

## 2019-06-16 ENCOUNTER — Inpatient Hospital Stay: Payer: BC Managed Care – PPO | Attending: Hematology and Oncology

## 2019-06-16 ENCOUNTER — Other Ambulatory Visit: Payer: Self-pay

## 2019-06-16 ENCOUNTER — Inpatient Hospital Stay (HOSPITAL_BASED_OUTPATIENT_CLINIC_OR_DEPARTMENT_OTHER): Payer: BC Managed Care – PPO | Admitting: Hematology and Oncology

## 2019-06-16 DIAGNOSIS — Z856 Personal history of leukemia: Secondary | ICD-10-CM | POA: Diagnosis not present

## 2019-06-16 DIAGNOSIS — Z7982 Long term (current) use of aspirin: Secondary | ICD-10-CM

## 2019-06-16 DIAGNOSIS — R200 Anesthesia of skin: Secondary | ICD-10-CM | POA: Insufficient documentation

## 2019-06-16 DIAGNOSIS — D0511 Intraductal carcinoma in situ of right breast: Secondary | ICD-10-CM | POA: Insufficient documentation

## 2019-06-16 DIAGNOSIS — Z510 Encounter for antineoplastic radiation therapy: Secondary | ICD-10-CM | POA: Diagnosis not present

## 2019-06-16 DIAGNOSIS — Z170 Estrogen receptor positive status [ER+]: Secondary | ICD-10-CM

## 2019-06-16 LAB — CBC WITH DIFFERENTIAL (CANCER CENTER ONLY)
Abs Immature Granulocytes: 0.02 10*3/uL (ref 0.00–0.07)
Basophils Absolute: 0.1 10*3/uL (ref 0.0–0.1)
Basophils Relative: 1 %
Eosinophils Absolute: 0.2 10*3/uL (ref 0.0–0.5)
Eosinophils Relative: 3 %
HCT: 40 % (ref 36.0–46.0)
Hemoglobin: 12.9 g/dL (ref 12.0–15.0)
Immature Granulocytes: 0 %
Lymphocytes Relative: 22 %
Lymphs Abs: 1.5 10*3/uL (ref 0.7–4.0)
MCH: 30.1 pg (ref 26.0–34.0)
MCHC: 32.3 g/dL (ref 30.0–36.0)
MCV: 93.5 fL (ref 80.0–100.0)
Monocytes Absolute: 0.5 10*3/uL (ref 0.1–1.0)
Monocytes Relative: 8 %
Neutro Abs: 4.3 10*3/uL (ref 1.7–7.7)
Neutrophils Relative %: 66 %
Platelet Count: 207 10*3/uL (ref 150–400)
RBC: 4.28 MIL/uL (ref 3.87–5.11)
RDW: 13 % (ref 11.5–15.5)
WBC Count: 6.5 10*3/uL (ref 4.0–10.5)
nRBC: 0 % (ref 0.0–0.2)

## 2019-06-16 LAB — CMP (CANCER CENTER ONLY)
ALT: 12 U/L (ref 0–44)
AST: 11 U/L — ABNORMAL LOW (ref 15–41)
Albumin: 3.8 g/dL (ref 3.5–5.0)
Alkaline Phosphatase: 45 U/L (ref 38–126)
Anion gap: 9 (ref 5–15)
BUN: 14 mg/dL (ref 6–20)
CO2: 24 mmol/L (ref 22–32)
Calcium: 9.1 mg/dL (ref 8.9–10.3)
Chloride: 109 mmol/L (ref 98–111)
Creatinine: 0.82 mg/dL (ref 0.44–1.00)
GFR, Est AFR Am: >60 mL/min (ref >60)
GFR, Estimated: >60 mL/min (ref >60)
Glucose, Bld: 117 mg/dL — ABNORMAL HIGH (ref 70–99)
Potassium: 4.4 mmol/L (ref 3.5–5.1)
Sodium: 142 mmol/L (ref 135–145)
Total Bilirubin: 0.4 mg/dL (ref 0.3–1.2)
Total Protein: 6.9 g/dL (ref 6.5–8.1)

## 2019-06-17 ENCOUNTER — Ambulatory Visit
Admission: RE | Admit: 2019-06-17 | Discharge: 2019-06-17 | Disposition: A | Discharge status: Home / Self Care | Payer: BC Managed Care – PPO | Source: Ambulatory Visit | Attending: Radiation Oncology | Admitting: Radiation Oncology

## 2019-06-17 ENCOUNTER — Other Ambulatory Visit: Payer: Self-pay

## 2019-06-17 ENCOUNTER — Encounter: Payer: Self-pay | Admitting: Radiation Oncology

## 2019-06-17 ENCOUNTER — Encounter: Payer: Self-pay | Admitting: *Deleted

## 2019-06-17 ENCOUNTER — Other Ambulatory Visit (HOSPITAL_COMMUNITY): Payer: Self-pay

## 2019-06-17 ENCOUNTER — Telehealth: Payer: Self-pay | Admitting: Hematology and Oncology

## 2019-06-17 DIAGNOSIS — Z510 Encounter for antineoplastic radiation therapy: Secondary | ICD-10-CM | POA: Diagnosis not present

## 2019-06-17 DIAGNOSIS — D0511 Intraductal carcinoma in situ of right breast: Secondary | ICD-10-CM | POA: Diagnosis not present

## 2019-06-17 DIAGNOSIS — R202 Paresthesia of skin: Secondary | ICD-10-CM

## 2019-06-17 NOTE — Telephone Encounter (Signed)
I left a message regarding video visit  °

## 2019-06-18 ENCOUNTER — Ambulatory Visit (HOSPITAL_COMMUNITY): Payer: BC Managed Care – PPO | Attending: Cardiology

## 2019-06-18 DIAGNOSIS — R202 Paresthesia of skin: Secondary | ICD-10-CM

## 2019-06-21 DIAGNOSIS — D0511 Intraductal carcinoma in situ of right breast: Secondary | ICD-10-CM | POA: Diagnosis not present

## 2019-06-26 DIAGNOSIS — Z923 Personal history of irradiation: Secondary | ICD-10-CM

## 2019-06-26 HISTORY — DX: Personal history of irradiation: Z92.3

## 2019-07-01 ENCOUNTER — Encounter: Payer: Self-pay | Admitting: Diagnostic Neuroimaging

## 2019-07-01 ENCOUNTER — Other Ambulatory Visit: Payer: Self-pay

## 2019-07-01 ENCOUNTER — Ambulatory Visit: Payer: BC Managed Care – PPO | Admitting: Diagnostic Neuroimaging

## 2019-07-01 VITALS — BP 112/78 | HR 80 | Temp 98.6°F | Ht 65.0 in | Wt 160.0 lb

## 2019-07-01 DIAGNOSIS — R200 Anesthesia of skin: Secondary | ICD-10-CM | POA: Diagnosis not present

## 2019-07-01 DIAGNOSIS — Q383 Other congenital malformations of tongue: Secondary | ICD-10-CM | POA: Diagnosis not present

## 2019-07-01 NOTE — Progress Notes (Signed)
[facility]  PATIENT: Joann Meadows DOB: 1976-03-03  REFERRING CLINICIAN: ER  HISTORY FROM: patient  REASON FOR VISIT: new consult    HISTORICAL  CHIEF COMPLAINT:  Chief Complaint  Patient presents with  . Headache    rm 6 New Pt , "2 episodes of arm/leg weakness, headaches with cramp in my tongue that causes trouble speaking"    HISTORY OF PRESENT ILLNESS:   43 year old female here for evaluation of abnormal spells.    History of AML diagnosed 1999.  History of ductal carcinoma in situ of right breast diagnosed 01/25/2019, status post radiation therapy and tamoxifen.  Radiation treatment was started on 05/24/2019 and completed last Thursday.  Patient tried tamoxifen for sometime in April 2020 but could not tolerate side effects.  June 2020 patient had episode of generalized dizziness, weakness, lethargy, whole body numbness starting from her entire head and face radiating down her entire body.  Episode lasted for 1 to 2 hours and resolved.  06/15/2019 patient had another attack of generalized dizziness, weakness, this time associated with right face numbness and abnormal sensation in her tongue.  Patient went to the hospital for evaluation.  MRI of the brain, CTA of the head and neck were unremarkable.  Patient was diagnosed with possible complicated migraine or other stress reaction.  Since that time no recurrent symptoms of numbness or tingling.  Occasionally she has a few seconds of cramping sensation in her tongue.   REVIEW OF SYSTEMS: Full 14 system review of systems performed and negative with exception of: as per HPI.  ALLERGIES: No Known Allergies  HOME MEDICATIONS: Outpatient Medications Prior to Visit  Medication Sig Dispense Refill  . acetaminophen (TYLENOL) 325 MG tablet Take 650 mg by mouth every 6 (six) hours as needed for mild pain.     . tamoxifen (NOLVADEX) 20 MG tablet Take 1 tablet (20 mg total) by mouth daily. (Patient not taking:  Reported on 06/15/2019) 90 tablet 3   No facility-administered medications prior to visit.     PAST MEDICAL HISTORY: Past Medical History:  Diagnosis Date  . Blood transfusion without reported diagnosis    leaukemia 18 years ago  . Breast cancer ([place]) 2020  . Cholestasis during pregnancy   . Endometriosis   . Family history of colon cancer   . Family history of leukemia   . Fibroid   . Hx of radiation therapy 06/2019  . Leukemia ([place])    20 years ago  . PONV (postoperative nausea and vomiting)    pt states she vomits after colonoscopies    PAST SURGICAL HISTORY: Past Surgical History:  Procedure Laterality Date  . BREAST LUMPECTOMY WITH RADIOACTIVE SEED LOCALIZATION Bilateral 02/11/2019   Procedure: BILATERAL BREAST LUMPECTOMIES WITH BILATERAL RADIOACTIVE SEED LOCALIZATION;  Surgeon: Donnie Mesa, MD;  Location: [facility];  Service: General;  Laterality: Bilateral;  . NO PAST SURGERIES      FAMILY HISTORY: Family History  Problem Relation Age of Onset  . Colon cancer Mother 7       d. 13  . Hypertension Father   . Diabetes Maternal Aunt   . Diabetes Maternal Uncle   . Mental retardation Maternal Uncle   . Diabetes Paternal Aunt   . Diabetes Maternal Grandmother   . Stroke Paternal Grandmother        c. 34  . Leukemia Paternal Grandfather        d. 52; ?CLL  . Leukemia Cousin 4  mat first cousin  . Lymphoma Cousin 2       pat first cousin    SOCIAL HISTORY: Social History   Socioeconomic History  . Marital status: Single    Spouse name: Not on file  . Number of children: 2  . Years of education: Not on file  . Highest education level: High school graduate  Occupational History  . Not on file  Social Needs  . Financial resource strain: Not on file  . Food insecurity    Worry: Not on file    Inability: Not on file  . Transportation needs    Medical: No    Non-medical: No  Tobacco Use  . Smoking status: Former Smoker     Years: 10.00    Types: Cigarettes    Quit date: 01/25/2015    Years since quitting: 4.4  . Smokeless tobacco: Never Used  Substance and Sexual Activity  . Alcohol use: No  . Drug use: No  . Sexual activity: Yes    Birth control/protection: None  Lifestyle  . Physical activity    Days per week: Not on file    Minutes per session: Not on file  . Stress: Not on file  Relationships  . Social Herbalist on phone: Not on file    Gets together: Not on file    Attends religious service: Not on file    Active member of club or organization: Not on file    Attends meetings of clubs or organizations: Not on file    Relationship status: Not on file  . Intimate partner violence    Fear of current or ex partner: No    Emotionally abused: No    Physically abused: No    Forced sexual activity: No  Other Topics Concern  . Not on file  Social History Narrative   Lives with children     PHYSICAL EXAM  GENERAL EXAM/CONSTITUTIONAL: Vitals:  Vitals:   07/01/19 1419  BP: 112/78  Pulse: 80  Temp: 98.6 F (37 C)  Weight: 160 lb (72.6 kg)  Height: 5\' 5"  (1.651 m)     Body mass index is 26.63 kg/m. Wt Readings from Last 3 Encounters:  07/01/19 160 lb (72.6 kg)  06/16/19 156 lb 4.8 oz (70.9 kg)  06/15/19 150 lb (68 kg)     Patient is in no distress; well developed, nourished and groomed; neck is supple  CARDIOVASCULAR:  Examination of carotid arteries is normal; no carotid bruits  Regular rate and rhythm, no murmurs  Examination of peripheral vascular system by observation and palpation is normal  EYES:  Ophthalmoscopic exam of optic discs and posterior segments is normal; no papilledema or hemorrhages  No exam data present  MUSCULOSKELETAL:  Gait, strength, tone, movements noted in Neurologic exam below  NEUROLOGIC: MENTAL STATUS:  No flowsheet data found.  awake, alert, oriented to person, place and time  recent and remote memory intact  normal  attention and concentration  language fluent, comprehension intact, naming intact  fund of knowledge appropriate  CRANIAL NERVE:   2nd - no papilledema on fundoscopic exam  2nd, 3rd, 4th, 6th - pupils equal and reactive to light, visual fields full to confrontation, extraocular muscles intact, no nystagmus  5th - facial sensation symmetric  7th - facial strength symmetric  8th - hearing intact  9th - palate elevates symmetrically, uvula midline  11th - shoulder shrug symmetric  12th - tongue protrusion midline  MOTOR:  normal bulk and tone, full strength in the BUE, BLE  SENSORY:   normal and symmetric to light touch, temperature, vibration  COORDINATION:   finger-nose-finger, fine finger movements normal  REFLEXES:   deep tendon reflexes present and symmetric  GAIT/STATION:   narrow based gait     DIAGNOSTIC DATA (LABS, IMAGING, TESTING) - I reviewed patient records, labs, notes, testing and imaging myself where available.  Lab Results  Component Value Date   WBC 6.5 06/16/2019   HGB 12.9 06/16/2019   HCT 40.0 06/16/2019   MCV 93.5 06/16/2019   PLT 207 06/16/2019      Component Value Date/Time   NA 142 06/16/2019 1438   K 4.4 06/16/2019 1438   CL 109 06/16/2019 1438   CO2 24 06/16/2019 1438   GLUCOSE 117 (H) 06/16/2019 1438   BUN 14 06/16/2019 1438   CREATININE 0.82 06/16/2019 1438   CALCIUM 9.1 06/16/2019 1438   PROT 6.9 06/16/2019 1438   ALBUMIN 3.8 06/16/2019 1438   AST 11 (L) 06/16/2019 1438   ALT 12 06/16/2019 1438   ALKPHOS 45 06/16/2019 1438   BILITOT 0.4 06/16/2019 1438   GFRNONAA >60 06/16/2019 1438   GFRAA >60 06/16/2019 1438   No results found for: CHOL, HDL, LDLCALC, LDLDIRECT, TRIG, CHOLHDL No results found for: HGBA1C No results found for: VITAMINB12 No results found for: TSH   06/15/19 MRI brain [I reviewed images myself and agree with interpretation. -[facility]]  - normal  06/15/19 CTA head / neck - normal  06/18/19 TTE   1. The left ventricle has normal systolic function, with an ejection fraction of 55-60%. The cavity size was normal. Left ventricular diastolic parameters were normal.  2. The right ventricle has normal systolic function. The cavity was normal. There is no increase in right ventricular wall thickness. Right ventricular systolic pressure could not be assessed.  3. The aortic valve is tricuspid. Mild sclerosis of the aortic valve. Aortic valve regurgitation was not assessed by color flow Doppler.  4. The aorta is normal in size and structure.    ASSESSMENT AND PLAN  43 y.o. year old female here with new onset episodes of generalized dizziness, weakness, numbness in June and July 2020.  Stroke/TIA work-up completed.  Unclear etiology but could be related to side effects from oncology treatments versus complicated migraine or other cause.  Reassured patient and recommend to monitor symptoms for now.  May consider migraine treatments in future.   Ddx: side effects from oncology treatments vs complicated migraine vs other secondary cause  1. Numbness   2. Tongue abnormality     PLAN:  - monitor symptoms - may consider migraine treatments in future  Return for pending if symptoms worsen or fail to improve.    Penni Bombard, MD 2/0/3559, 7:41 PM Certified in Neurology, Neurophysiology and Neuroimaging  [facility] [address] [place], [place] [address] [phone]

## 2019-07-08 NOTE — Progress Notes (Signed)
  Patient Name: Joann Meadows MRN: [id number] DOB: 12/22/1975 Referring Physician: Donnie Mesa (Profile Not Attached) Date of Service: 06/17/2019 [facility], [place]                                                        End Of Treatment Note  Diagnoses: D05.11-Intraductal carcinoma in situ of right breast  Cancer Staging: Stage 0 DCIS, right breast  Intent: Curative  Radiation Treatment Dates: 05/19/2019 through 06/17/2019 Site Technique Total Dose - Gy Dose per Fx Completed Fx Beam Energies  Breast: Breast_Rt 3D 40.05/40.05 2.67 15/15 6X  Breast: Breast_Rt_Bst 3D 10/10 2 5/5 6X   Narrative: The patient tolerated radiation therapy relatively well. She experienced increasing fatigue and skin irritation as she progressed through treatment. Her right breast is erythematous with mild edema. She is applying Radiaplex gel to her skin twice a day and hydrocortisone cream as needed for itching. She also noted soreness of right nipple.   Plan: The patient will follow-up with radiation oncology in one month. Prescription made for lidocaine to apply to nipple as needed.  ________________________________________________  Eppie Gibson, MD  This document serves as a record of services personally performed by Eppie Gibson, MD. It was created on her behalf by Rae Lips, a trained medical scribe. The creation of this record is based on the scribe's personal observations and the provider's statements to them. This document has been checked and approved by the attending provider.

## 2019-07-12 DIAGNOSIS — E7800 Pure hypercholesterolemia, unspecified: Secondary | ICD-10-CM | POA: Diagnosis not present

## 2019-07-12 DIAGNOSIS — Z658 Other specified problems related to psychosocial circumstances: Secondary | ICD-10-CM | POA: Diagnosis not present

## 2019-07-12 DIAGNOSIS — D0510 Intraductal carcinoma in situ of unspecified breast: Secondary | ICD-10-CM | POA: Diagnosis not present

## 2019-07-22 DIAGNOSIS — E7800 Pure hypercholesterolemia, unspecified: Secondary | ICD-10-CM | POA: Diagnosis not present

## 2019-07-22 NOTE — Progress Notes (Signed)
I called the patient today about her upcoming follow-up appointment in radiation oncology.   Given concerns about the COVID-19 pandemic, I offered a phone assessment with the patient to determine if coming to the clinic was necessary. She accepted.  I let the patient know that I had spoken with Dr. Isidore Moos, and she wanted them to know the importance of washing their hands for at least 20 seconds at a time, especially after going out in public, and before they eat.  Limit going out in public whenever possible. Do not touch your face, unless your hands are clean, such as when bathing. Get plenty of rest, eat well, and stay hydrated.   The patient denies any symptomatic concerns.  Specifically, they report good healing of their skin in the radiation fields.  Skin is intact.    I recommended that she continue skin care by applying oil or lotion with vitamin E to the skin in the radiation fields, BID, for 2 more months.  Continue follow-up with medical oncology - follow-up is scheduled on 09/16/19 with Dr. Lindi Adie.  I explained that yearly mammograms are important for patients with intact breast tissue, and physical exams are important after mastectomy for patients that cannot undergo mammography.  I encouraged her to call if she had further questions or concerns about her healing. Otherwise, she will follow-up PRN in radiation oncology. Patient is pleased with this plan, and we will cancel her upcoming follow-up to reduce the risk of COVID-19 transmission.

## 2019-07-23 ENCOUNTER — Ambulatory Visit
Admission: RE | Admit: 2019-07-23 | Discharge: 2019-07-23 | Disposition: A | Discharge status: Home / Self Care | Payer: BC Managed Care – PPO | Source: Ambulatory Visit | Attending: Radiation Oncology | Admitting: Radiation Oncology

## 2019-09-15 NOTE — Progress Notes (Signed)
HEMATOLOGY-ONCOLOGY MYCHART VIDEO VISIT PROGRESS NOTE  I connected with Joann Meadows on 09/16/2019 at  9:15 AM EDT by MyChart video conference and verified that I am speaking with the correct person using two identifiers.  I discussed the limitations, risks, security and privacy concerns of performing an evaluation and management service by MyChart and the availability of in person appointments.  I also discussed with the patient that there may be a patient responsible charge related to this service. The patient expressed understanding and agreed to proceed.  Patient's Location: Home Physician Location: Clinic  CHIEF COMPLIANT: Follow-up to discuss reinitiation of anti-estrogen therapy  INTERVAL HISTORY: Joann Meadows is a 43 y.o. female with above-mentioned history of right breast DCIS for which she underwent a lumpectomy, radiation, and is currently on surveillance as she could not tolerate tamoxifen. She presents over MyChart today for follow-up to discuss reinitiation of anti-estrogen therapy.   Oncology History  Ductal carcinoma in situ (DCIS) of right breast  01/25/2019 Initial Diagnosis   Screening mammogram showed bilateral calcifications right breast 5 mm, pleomorphic.  Left breast 1.6 cm punctate, smaller group of calcifications spanning 3 mm.  Right lower outer quadrant biopsy revealed low-grade DCIS ER 90%, PR 70%; biopsy of left breast calcifications ALH and PASH   01/31/2019 Genetic Testing   ATM c.8495G>A and RECQL4 c.2587G>A VUS identified on the 9-gene STAT panel and Multi-cancer panel through [facility].  The STAT Breast cancer panel offered by [facility] includes sequencing and rearrangement analysis for the following 9 genes:  ATM, BRCA1, BRCA2, CDH1, CHEK2, PALB2, PTEN, STK11 and TP53.   The Multi-Gene Panel offered by [facility] includes sequencing and/or deletion duplication testing of the following 84 genes: AIP, ALK, APC, ATM, AXIN2,BAP1,  BARD1, BLM, BMPR1A, BRCA1, BRCA2,  BRIP1, CASR, CDC73, CDH1, CDK4, CDKN1B, CDKN1C, CDKN2A (p14ARF), CDKN2A (p16INK4a), CEBPA, CHEK2, CTNNA1, DICER1, DIS3L2, EGFR (c.2369C>T, p.Thr790Met variant only), EPCAM (Deletion/duplication testing only), FH, FLCN, GATA2, GPC3, GREM1 (Promoter region deletion/duplication testing only), HOXB13 (c.251G>A, p.Gly84Glu), HRAS, KIT, MAX, MEN1, MET, MITF (c.952G>A, p.Glu318Lys variant only), MLH1, MSH2, MSH3, MSH6, MUTYH, NBN, NF1, NF2, NTHL1, PALB2, PDGFRA, PHOX2B, PMS2, POLD1, POLE, POT1, PRKAR1A, PTCH1, PTEN, RAD50, RAD51C, RAD51D, RB1, RECQL4, RET, RUNX1, SDHAF2, SDHA (sequence changes only), SDHB, SDHC, SDHD, SMAD4, SMARCA4, SMARCB1, SMARCE1, STK11, SUFU, TERC, TERT, TMEM127, TP53, TSC1, TSC2, VHL, WRN and WT1.  he report date is February 02, 2019.   02/11/2019 Surgery   RT lumpectomy: DICS Intermediate grade, Margins Neg, ER 90%, PR 70%; Lt Lumpectomy: UDH   02/26/2019 -  Anti-estrogen oral therapy   Tamoxifen 35m daily, plan for 5 years   05/20/2019 - 06/17/2019 Radiation Therapy   Adjuvant XRT   [place]  09/1998 Initial Diagnosis   [place] [place] with 2 induction regimens followed by 6 consolidation regimens, remission     REVIEW OF SYSTEMS:   Constitutional: Denies fevers, chills or abnormal weight loss Eyes: Denies blurriness of vision Ears, nose, mouth, throat, and face: Denies mucositis or sore throat Respiratory: Denies cough, dyspnea or wheezes Cardiovascular: Denies palpitation, chest discomfort Gastrointestinal:  Denies nausea, heartburn or change in bowel habits Skin: Denies abnormal skin rashes Lymphatics: Denies new lymphadenopathy or easy bruising Neurological:Denies numbness, tingling or new weaknesses Behavioral/Psych: Mood is stable, no new changes  Extremities: No lower extremity edema Breast: denies any pain or lumps or nodules in either breasts All other systems were reviewed with the patient and are negative.   Observations/Objective:  There were no vitals  filed for this visit. There is no height or weight on file to calculate BMI.  I have reviewed the data as listed CMP Latest Ref Rng & Units 06/16/2019 06/15/2019 06/15/2019  Glucose 70 - 99 mg/dL 117(H) 111(H) 120(H)  BUN 6 - 20 mg/dL _0 Creatinine 0.44 - 1.00 mg/dL 0.82 0.70 0.74  Sodium 135 - 145 mmol/L 142 143 141  Potassium 3.5 - 5.1 mmol/L 4.4 3.9 4.5  Chloride 98 - 111 mmol/L 109 109 111  CO2 22 - 32 mmol/L 24 - 25  Calcium 8.9 - 10.3 mg/dL 9.1 - 9.0  Total Protein 6.5 - 8.1 g/dL 6.9 - 6.6  Total Bilirubin 0.3 - 1.2 mg/dL 0.4 - 0.4  Alkaline Phos 38 - 126 U/L 45 - 41  AST 15 - 41 U/L 11(L) - 14(L)  ALT 0 - 44 U/L 12 - 15    Lab Results  Component Value Date   WBC 6.5 06/16/2019   HGB 12.9 06/16/2019   HCT 40.0 06/16/2019   MCV 93.5 06/16/2019   PLT 207 06/16/2019   NEUTROABS 4.3 06/16/2019      Assessment Plan:  Ductal carcinoma in situ (DCIS) of right breast 02/11/19:RT lumpectomy: DICS Intermediate grade, Margins Neg, ER 90%, PR 70%; Lt Lumpectomy: UDH Stage TisNx Stage 0  Plan: 1. Adj RT started 05/24/2019 2. Adj Tamoxifen for 5 years (because of side effects we will D/C it.  She started tamoxifen prior to radiation and that she could not tolerate it.  She felt depressed, had one episode of numbness of the face. stopped it at that time.  Emergency room visit for numbness of the face and difficulty with articulation of speech.  CT head and brain MRI were negative.  Treatment plan: I discussed the pros and cons of restarting tamoxifen at a lower dosage of 5 mg daily.   I discussed the assessment and treatment plan with the patient. The patient was provided an opportunity to ask questions and all were answered. The patient agreed with the plan and demonstrated an understanding of the instructions. The patient was advised to call back or seek an in-person evaluation if the symptoms worsen or if the condition  fails to improve as anticipated.   I provided 15 minutes of face-to-face MyChart video visit time during this encounter.    Rulon Eisenmenger, MD 09/16/2019   I, Molly Dorshimer, am acting as scribe for Nicholas Lose, MD.  I have reviewed the above documentation for accuracy and completeness, and I agree with the above.

## 2019-09-16 ENCOUNTER — Inpatient Hospital Stay: Payer: BC Managed Care – PPO | Attending: Hematology and Oncology | Admitting: Hematology and Oncology

## 2019-09-16 ENCOUNTER — Encounter: Payer: Self-pay | Admitting: *Deleted

## 2019-09-16 DIAGNOSIS — Z79810 Long term (current) use of selective estrogen receptor modulators (SERMs): Secondary | ICD-10-CM | POA: Diagnosis not present

## 2019-09-16 DIAGNOSIS — D0511 Intraductal carcinoma in situ of right breast: Secondary | ICD-10-CM | POA: Insufficient documentation

## 2019-09-16 DIAGNOSIS — C9201 Acute myeloblastic leukemia, in remission: Secondary | ICD-10-CM | POA: Insufficient documentation

## 2019-09-16 DIAGNOSIS — Z170 Estrogen receptor positive status [ER+]: Secondary | ICD-10-CM | POA: Diagnosis not present

## 2019-09-16 DIAGNOSIS — Z923 Personal history of irradiation: Secondary | ICD-10-CM | POA: Insufficient documentation

## 2019-09-16 NOTE — Assessment & Plan Note (Signed)
02/11/19:RT lumpectomy: DICS Intermediate grade, Margins Neg, ER 90%, PR 70%; Lt Lumpectomy: UDH Stage TisNx Stage 0  Plan: 1. Adj RT started 05/24/2019 2. Adj Tamoxifen for 5 years  She started tamoxifen prior to radiation and that she could not tolerate it.  She felt depressed, had one episode of numbness of the face. stopped it at that time.  Emergency room visit for numbness of the face and difficulty with articulation of speech.  CT head and brain MRI were negative.  Treatment plan: I discussed the pros and cons of restarting tamoxifen at a lower dosage of 5 mg daily.

## 2019-09-29 DIAGNOSIS — N760 Acute vaginitis: Secondary | ICD-10-CM | POA: Diagnosis not present

## 2019-09-29 DIAGNOSIS — N939 Abnormal uterine and vaginal bleeding, unspecified: Secondary | ICD-10-CM | POA: Diagnosis not present

## 2019-09-29 DIAGNOSIS — L739 Follicular disorder, unspecified: Secondary | ICD-10-CM | POA: Diagnosis not present

## 2019-10-26 ENCOUNTER — Telehealth: Payer: Self-pay | Admitting: Hematology and Oncology

## 2019-10-26 ENCOUNTER — Telehealth: Payer: Self-pay | Admitting: *Deleted

## 2019-10-26 NOTE — Telephone Encounter (Signed)
Received a call from pt stating over the last 2 weeks she has experienced increase shortness of breath especially with normal conversations.  Pt also states she is experiencing increase in fatigue to the point she is not able to play with her children.  Pt also complaining of right underarm tenderness.  Pt denies any redness, warmth, or recent trauma.  Per Dr. Lindi Adie, pt needs to be evaluated in [facility].  High priority message sent to scheduling to schedule apt and to let pt be aware of date and time.

## 2019-10-26 NOTE — Telephone Encounter (Signed)
Confirmed 12/2 appointment with patient.

## 2019-10-27 ENCOUNTER — Other Ambulatory Visit: Payer: Self-pay

## 2019-10-27 ENCOUNTER — Ambulatory Visit (HOSPITAL_COMMUNITY)
Admission: RE | Admit: 2019-10-27 | Discharge: 2019-10-27 | Disposition: A | Discharge status: Home / Self Care | Payer: BC Managed Care – PPO | Source: Ambulatory Visit | Attending: Medical | Admitting: Medical

## 2019-10-27 ENCOUNTER — Inpatient Hospital Stay: Payer: BC Managed Care – PPO | Attending: Hematology and Oncology | Admitting: Medical

## 2019-10-27 ENCOUNTER — Inpatient Hospital Stay: Payer: BC Managed Care – PPO

## 2019-10-27 VITALS — BP 128/88 | HR 88 | Temp 98.2°F | Resp 18 | Ht 65.0 in | Wt 166.2 lb

## 2019-10-27 DIAGNOSIS — Z170 Estrogen receptor positive status [ER+]: Secondary | ICD-10-CM | POA: Diagnosis not present

## 2019-10-27 DIAGNOSIS — R0602 Shortness of breath: Secondary | ICD-10-CM | POA: Diagnosis not present

## 2019-10-27 DIAGNOSIS — Z923 Personal history of irradiation: Secondary | ICD-10-CM | POA: Diagnosis not present

## 2019-10-27 DIAGNOSIS — D0511 Intraductal carcinoma in situ of right breast: Secondary | ICD-10-CM | POA: Diagnosis not present

## 2019-10-27 DIAGNOSIS — R0902 Hypoxemia: Secondary | ICD-10-CM | POA: Diagnosis not present

## 2019-10-27 DIAGNOSIS — Z79810 Long term (current) use of selective estrogen receptor modulators (SERMs): Secondary | ICD-10-CM | POA: Insufficient documentation

## 2019-10-27 DIAGNOSIS — F17210 Nicotine dependence, cigarettes, uncomplicated: Secondary | ICD-10-CM | POA: Insufficient documentation

## 2019-10-27 DIAGNOSIS — N920 Excessive and frequent menstruation with regular cycle: Secondary | ICD-10-CM | POA: Diagnosis not present

## 2019-10-27 DIAGNOSIS — C9201 Acute myeloblastic leukemia, in remission: Secondary | ICD-10-CM | POA: Diagnosis not present

## 2019-10-27 LAB — CMP (CANCER CENTER ONLY)
ALT: 15 U/L (ref 0–44)
AST: 13 U/L — ABNORMAL LOW (ref 15–41)
Albumin: 4.2 g/dL (ref 3.5–5.0)
Alkaline Phosphatase: 59 U/L (ref 38–126)
Anion gap: 9 (ref 5–15)
BUN: 19 mg/dL (ref 6–20)
CO2: 25 mmol/L (ref 22–32)
Calcium: 9.1 mg/dL (ref 8.9–10.3)
Chloride: 107 mmol/L (ref 98–111)
Creatinine: 0.84 mg/dL (ref 0.44–1.00)
GFR, Est AFR Am: >60 mL/min (ref >60)
GFR, Estimated: >60 mL/min (ref >60)
Glucose, Bld: 96 mg/dL (ref 70–99)
Potassium: 3.9 mmol/L (ref 3.5–5.1)
Sodium: 141 mmol/L (ref 135–145)
Total Bilirubin: 0.3 mg/dL (ref 0.3–1.2)
Total Protein: 7.2 g/dL (ref 6.5–8.1)

## 2019-10-27 LAB — CBC WITH DIFFERENTIAL (CANCER CENTER ONLY)
Abs Immature Granulocytes: 0.02 10*3/uL (ref 0.00–0.07)
Basophils Absolute: 0 10*3/uL (ref 0.0–0.1)
Basophils Relative: 1 %
Eosinophils Absolute: 0.2 10*3/uL (ref 0.0–0.5)
Eosinophils Relative: 2 %
HCT: 40.2 % (ref 36.0–46.0)
Hemoglobin: 13.1 g/dL (ref 12.0–15.0)
Immature Granulocytes: 0 %
Lymphocytes Relative: 25 %
Lymphs Abs: 1.7 10*3/uL (ref 0.7–4.0)
MCH: 29.9 pg (ref 26.0–34.0)
MCHC: 32.6 g/dL (ref 30.0–36.0)
MCV: 91.8 fL (ref 80.0–100.0)
Monocytes Absolute: 0.6 10*3/uL (ref 0.1–1.0)
Monocytes Relative: 8 %
Neutro Abs: 4.2 10*3/uL (ref 1.7–7.7)
Neutrophils Relative %: 64 %
Platelet Count: 220 10*3/uL (ref 150–400)
RBC: 4.38 MIL/uL (ref 3.87–5.11)
RDW: 12.8 % (ref 11.5–15.5)
WBC Count: 6.7 10*3/uL (ref 4.0–10.5)
nRBC: 0 % (ref 0.0–0.2)

## 2019-10-27 MED ORDER — FLOVENT HFA 110 MCG/ACT IN AERO: 2.0000 | INHALATION_SPRAY | Freq: Two times a day (BID) | RESPIRATORY_TRACT | 12 refills | Status: DC

## 2019-10-27 MED ORDER — SODIUM CHLORIDE (PF) 0.9 % IJ SOLN
INTRAMUSCULAR | Status: AC
  Filled 2019-10-27: qty 50

## 2019-10-27 MED ORDER — IOHEXOL 350 MG/ML SOLN
100.0000 mL | Freq: Once | INTRAVENOUS | Status: AC | PRN
  Administered 2019-10-27: 100 mL via INTRAVENOUS

## 2019-10-27 NOTE — Progress Notes (Signed)
Pt ambulated w/O2 sats/HR monitor by PA Lucianne Lei. At rest: oxygen dats at 99% and HR at 86 With ambulation: oxygen sats dropped to 82% and HR increased to 108

## 2019-10-27 NOTE — Patient Instructions (Signed)
COVID-19: How to Protect Yourself and Others Know how it spreads  There is currently no vaccine to prevent coronavirus disease 2019 (COVID-19).  The best way to prevent illness is to avoid being exposed to this virus.  The virus is thought to spread mainly from person-to-person. ? Between people who are in close contact with one another (within about 6 feet). ? Through respiratory droplets produced when an infected person coughs, sneezes or talks. ? These droplets can land in the mouths or noses of people who are nearby or possibly be inhaled into the lungs. ? Some recent studies have suggested that COVID-19 may be spread by people who are not showing symptoms. Everyone should Clean your hands often  Wash your hands often with soap and water for at least 20 seconds especially after you have been in a public place, or after blowing your nose, coughing, or sneezing.  If soap and water are not readily available, use a hand sanitizer that contains at least 60% alcohol. Cover all surfaces of your hands and rub them together until they feel dry.  Avoid touching your eyes, nose, and mouth with unwashed hands. Avoid close contact  Stay home if you are sick.  Avoid close contact with people who are sick.  Put distance between yourself and other people. ? Remember that some people without symptoms may be able to spread virus. ? This is especially important for people who are at higher risk of getting very sick.www.cdc.gov/coronavirus/2019-ncov/need-extra-precautions/people-at-higher-risk.html Cover your mouth and nose with a cloth face cover when around others  You could spread COVID-19 to others even if you do not feel sick.  Everyone should wear a cloth face cover when they have to go out in public, for example to the grocery store or to pick up other necessities. ? Cloth face coverings should not be placed on young children under age 2, anyone who has trouble breathing, or is unconscious,  incapacitated or otherwise unable to remove the mask without assistance.  The cloth face cover is meant to protect other people in case you are infected.  Do NOT use a facemask meant for a healthcare worker.  Continue to keep about 6 feet between yourself and others. The cloth face cover is not a substitute for social distancing. Cover coughs and sneezes  If you are in a private setting and do not have on your cloth face covering, remember to always cover your mouth and nose with a tissue when you cough or sneeze or use the inside of your elbow.  Throw used tissues in the trash.  Immediately wash your hands with soap and water for at least 20 seconds. If soap and water are not readily available, clean your hands with a hand sanitizer that contains at least 60% alcohol. Clean and disinfect  Clean AND disinfect frequently touched surfaces daily. This includes tables, doorknobs, light switches, countertops, handles, desks, phones, keyboards, toilets, faucets, and sinks. www.cdc.gov/coronavirus/2019-ncov/prevent-getting-sick/disinfecting-your-home.html  If surfaces are dirty, clean them: Use detergent or soap and water prior to disinfection.  Then, use a household disinfectant. You can see a list of EPA-registered household disinfectants here. cdc.gov/coronavirus 03/30/2019 This information is not intended to replace advice given to you by your health care provider. Make sure you discuss any questions you have with your health care provider. Document Released: 03/09/2019 Document Revised: 04/07/2019 Document Reviewed: 03/09/2019 Elsevier Patient Education  2020 [employer]  

## 2019-10-28 LAB — IRON AND TIBC
Iron: 74 ug/dL (ref 41–142)
Saturation Ratios: 19 % — ABNORMAL LOW (ref 21–57)
TIBC: 385 ug/dL (ref 236–444)
UIBC: 311 ug/dL (ref 120–384)

## 2019-10-28 LAB — FERRITIN: Ferritin: 22 ng/mL (ref 11–307)

## 2019-10-28 NOTE — Progress Notes (Signed)
Sympt[facility] Management Clinic Progress Note   Jaylanni Grahovac [id number] [birth year] 43 y.o.  Joann Meadows is managed by Dr. Nicholas Lose  Actively treated with chemotherapy/immunotherapy/hormonal therapy: no  Next scheduled appointment with provider:04/19/2020  Assessment: Plan:    Hypoxia - Plan: CBC with Differential ([place] Only), CMP ([place] only), Ferritin, Iron and TIBC, CT ANGIO CHEST PE W OR WO CONTRAST  Shortness of breath - Plan: CBC with Differential ([place] Only), CMP ([place] only), Ferritin, Iron and TIBC, CT ANGIO CHEST PE W OR WO CONTRAST  Menorrhagia with regular cycle  Ductal carcinoma in situ (DCIS) of right breast  Acute myeloid leukemia in remission (HCC)   Shortness of breath with hypoxia: Joann Meadows was referred for a CT angiogram which returned showing:  Cardiovascular: Satisfactory opacification of the pulmonary arteries to the proximal segmental level. Apparent distal segmental and subsegmental filling defects at the lung bases are more likely to be artifactual given motion in this region. No definite evidence of pulmonary embolism. Normal heart size. No pericardial effusion.  Mediastinum/Nodes: No adenopathy.  Lungs/Pleura: There is a 4 mm nodule the right upper lobe (series 10, image 49). No consolidation or pleural effusion. No pneumothorax.  Upper Abdomen: No acute abnormality.  Musculoskeletal: No acute or significant osseous findings.  A CBC returned with a hemoglobin of 13.1 and hematocrit of 40.2.  These results were reviewed with Dr. Lindi Adie.  She was given a prescription for Flovent inhaler and will be referred to pulmonology for evaluation.   Menorrhagia: A CBC returned with a hemoglobin of 13.1 and hematocrit of 40.2.  Iron studies were collected.   Ductal carcinoma in situ of the right breast: The patient continues to be followed by Dr. Nicholas Lose and was last seen on a video visit on 09/16/2019.   She was previously on tamoxifen but stopped this medication in June by her report due to side effects.  She is scheduled to return for follow-up on 04/19/2020.   Acute myeloid leukemia in remission: The patient continues to be followed by Dr.Vinay Gudena.  She was treated for this in [place] and at the [facility].  Her labs today returned with a WBC of 6.7, hemoglobin 13.1, hematocrit 40.2, and platelet count of 220.  Her differential was within normal limits.   Please see After Visit Summary for patient specific instructions.  Future Appointments  Date Time Provider [place]  04/19/2020  8:45 AM Nicholas Lose, MD [facility] None    Orders Placed This Encounter  Procedures  . CT ANGIO CHEST PE W OR WO CONTRAST  . CBC with Differential ([place] Only)  . CMP ([place] only)  . Ferritin  . Iron and TIBC       Subjective:   Patient ID:  Zabria Vicini is a 43 y.o. (DOB [birth year]) female.  Chief Complaint:  Chief Complaint  Patient presents with  . Shortness of Breath    HPI Joann Meadows  is a 43 y.o. female with a diagnosis of a ductal carcinoma in situ of the right breast. The patient continues to be followed by Dr. Nicholas Lose and was last seen on a video visit on 09/16/2019.  She was previously on tamoxifen but stopped this medication in June by her report due to side effects.  She also has a history of an acute myeloid leukemia which is now in remission. She was treated for this in [place] and at the [facility].  She presents today with progressive shortness of breath which has been worsening since July.  She also has fatigue which is been stable since radiation.  She reports her shortness of breath increases with activity and that her fatigue hampers her ability to take care of her children.  She reports that she is easily irritable when taking care of her children due to her fatigue.  She has a history of  having periods with 4 to 5 days of spotting prior to 3 days of heavy menses followed by another 4 to 5 days of spotting.  She reports having occasional evening sweats.  She denies bright red blood per rectum, melena, increased bruising, fevers, or chills.  She has rare episodes of nosebleeds.  She denies any cough.  Medications: I have reviewed the patient's current medications.  Allergies: No Known Allergies  Past Medical History:  Diagnosis Date  . Blood transfusion without reported diagnosis    leaukemia 18 years ago  . Breast cancer ([place]) 2020  . Cholestasis during pregnancy   . Endometriosis   . Family history of colon cancer   . Family history of leukemia   . Fibroid   . Hx of radiation therapy 06/2019  . Leukemia ([place])    20 years ago  . PONV (postoperative nausea and vomiting)    pt states she vomits after colonoscopies    Past Surgical History:  Procedure Laterality Date  . BREAST LUMPECTOMY WITH RADIOACTIVE SEED LOCALIZATION Bilateral 02/11/2019   Procedure: BILATERAL BREAST LUMPECTOMIES WITH BILATERAL RADIOACTIVE SEED LOCALIZATION;  Surgeon: Donnie Mesa, MD;  Location: [facility];  Service: General;  Laterality: Bilateral;  . NO PAST SURGERIES      Family History  Problem Relation Age of Onset  . Colon cancer Mother 75       d. 46  . Hypertension Father   . Diabetes Maternal Aunt   . Diabetes Maternal Uncle   . Mental retardation Maternal Uncle   . Diabetes Paternal Aunt   . Diabetes Maternal Grandmother   . Stroke Paternal Grandmother        c. 40  . Leukemia Paternal Grandfather        d. 64; ?CLL  . Leukemia Cousin 4       mat first cousin  . Lymphoma Cousin 70       pat first cousin    Social History   Socioeconomic History  . Marital status: Single    Spouse name: Not on file  . Number of children: 2  . Years of education: Not on file  . Highest education level: High school graduate  Occupational History  . Not on file   Social Needs  . Financial resource strain: Not on file  . Food insecurity    Worry: Not on file    Inability: Not on file  . Transportation needs    Medical: No    Non-medical: No  Tobacco Use  . Smoking status: Former Smoker    Years: 10.00    Types: Cigarettes    Quit date: 01/25/2015    Years since quitting: 4.7  . Smokeless tobacco: Never Used  Substance and Sexual Activity  . Alcohol use: No  . Drug use: No  . Sexual activity: Yes    Birth control/protection: None  Lifestyle  . Physical activity    Days per week: Not on file    Minutes per session: Not on file  . Stress: Not on file  Relationships  .  Social Herbalist on phone: Not on file    Gets together: Not on file    Attends religious service: Not on file    Active member of club or organization: Not on file    Attends meetings of clubs or organizations: Not on file    Relationship status: Not on file  . Intimate partner violence    Fear of current or ex partner: No    Emotionally abused: No    Physically abused: No    Forced sexual activity: No  Other Topics Concern  . Not on file  Social History Narrative   Lives with children    Past Medical History, Surgical history, Social history, and Family history were reviewed and updated as appropriate.   Please see review of systems for further details on the patient's review from today.   Review of Systems:  Review of Systems  Constitutional: Positive for fatigue. Negative for chills, diaphoresis and fever.  HENT: Negative for trouble swallowing.   Respiratory: Positive for shortness of breath. Negative for cough, choking, chest tightness, wheezing and stridor.   Cardiovascular: Negative for chest pain and palpitations.  Genitourinary: Positive for menstrual problem.    Objective:   Physical Exam:  BP 128/88 (BP Location: Left Arm, Patient Position: Sitting)   Pulse 88   Temp 98.2 F (36.8 C) (Temporal)   Resp 18   Ht 5\' 5"  (1.651 m)    Wt 166 lb 3.2 oz (75.4 kg)   SpO2 100%   BMI 27.66 kg/m  ECOG: 1  Oxygen Saturation:  Room air at rest:    98 %  Pulse:  92 Room air with ambulation:   82 %  Pulse: 109  Physical Exam Constitutional:      General: She is not in acute distress.    Appearance: She is not diaphoretic.  HENT:     Head: Normocephalic and atraumatic.  Cardiovascular:     Rate and Rhythm: Regular rhythm. Tachycardia present.     Heart sounds: Normal heart sounds. No murmur. No friction rub. No gallop.   Pulmonary:     Effort: Pulmonary effort is normal. No respiratory distress.     Breath sounds: Normal breath sounds. No stridor. No wheezing or rales.  Skin:    General: Skin is warm and dry.     Coloration: Skin is not pale.     Findings: No erythema.  Neurological:     Mental Status: She is alert.  Psychiatric:        Behavior: Behavior normal.        Thought Content: Thought content normal.        Judgment: Judgment normal.     Lab Review:     Component Value Date/Time   NA 141 10/27/2019 1528   K 3.9 10/27/2019 1528   CL 107 10/27/2019 1528   CO2 25 10/27/2019 1528   GLUCOSE 96 10/27/2019 1528   BUN 19 10/27/2019 1528   CREATININE 0.84 10/27/2019 1528   CALCIUM 9.1 10/27/2019 1528   PROT 7.2 10/27/2019 1528   ALBUMIN 4.2 10/27/2019 1528   AST 13 (L) 10/27/2019 1528   ALT 15 10/27/2019 1528   ALKPHOS 59 10/27/2019 1528   BILITOT 0.3 10/27/2019 1528   GFRNONAA >60 10/27/2019 1528   GFRAA >60 10/27/2019 1528       Component Value Date/Time   WBC 6.7 10/27/2019 1528   WBC 6.0 06/15/2019 1530   RBC 4.38 10/27/2019 1528  HGB 13.1 10/27/2019 1528   HCT 40.2 10/27/2019 1528   PLT 220 10/27/2019 1528   MCV 91.8 10/27/2019 1528   MCH 29.9 10/27/2019 1528   MCHC 32.6 10/27/2019 1528   RDW 12.8 10/27/2019 1528   LYMPHSABS 1.7 10/27/2019 1528   MONOABS 0.6 10/27/2019 1528   EOSABS 0.2 10/27/2019 1528   BASOSABS 0.0 10/27/2019 1528   -------------------------------  Imaging  from last 24 hours (if applicable):  Radiology interpretation: Ct Angio Chest Pe W Or Wo Contrast  Result Date: 10/27/2019 CLINICAL DATA:  Shortness of breath EXAM: CT ANGIOGRAPHY CHEST WITH CONTRAST TECHNIQUE: Multidetector CT imaging of the chest was performed using the standard protocol during bolus administration of intravenous contrast. Multiplanar CT image reconstructions and MIPs were obtained to evaluate the vascular anatomy. CONTRAST:  144mL OMNIPAQUE IOHEXOL 350 MG/ML SOLN COMPARISON:  None. FINDINGS: Cardiovascular: Satisfactory opacification of the pulmonary arteries to the proximal segmental level. Apparent distal segmental and subsegmental filling defects at the lung bases are more likely to be artifactual given motion in this region. No definite evidence of pulmonary embolism. Normal heart size. No pericardial effusion. Mediastinum/Nodes: No adenopathy. Lungs/Pleura: There is a 4 mm nodule the right upper lobe (series 10, image 49). No consolidation or pleural effusion. No pneumothorax. Upper Abdomen: No acute abnormality. Musculoskeletal: No acute or significant osseous findings. Review of the MIP images confirms the above findings. IMPRESSION: No definite acute pulmonary embolism. Apparent distal segmental and subsegmental filling defects at the lung bases are more likely to be artifactual given motion in this region. 4 mm right upper lobe nodule. No follow-up needed if patient is low-risk. Non-contrast chest CT can be considered in 12 months if patient is high-risk. This recommendation follows the consensus statement: Guidelines for Management of Incidental Pulmonary Nodules Detected on CT Images: From the Fleischner Society 2017; Radiology 2017; 284:228-243. These results were called by telephone at the time of interpretation on 10/27/2019 at 5:15 pm to provider Sandi Mealy , who verbally acknowledged these results. Electronically Signed   By: Macy Mis M.D.   On: 10/27/2019 17:19         This case was discussed with Dr. Lindi Adie. He expresses agreement with my management of this patient.

## 2019-10-29 ENCOUNTER — Other Ambulatory Visit: Payer: Self-pay | Admitting: Medical

## 2019-10-29 DIAGNOSIS — R0600 Dyspnea, unspecified: Secondary | ICD-10-CM

## 2019-10-29 DIAGNOSIS — R0609 Other forms of dyspnea: Secondary | ICD-10-CM

## 2019-10-29 DIAGNOSIS — R0902 Hypoxemia: Secondary | ICD-10-CM

## 2019-10-29 MED ORDER — ALBUTEROL SULFATE HFA 108 (90 BASE) MCG/ACT IN AERS: 1.0000 | INHALATION_SPRAY | RESPIRATORY_TRACT | 5 refills | Status: DC | PRN

## 2019-10-29 NOTE — Progress Notes (Signed)
These results were called to Ria Clock and were reviewed with her . Her questions were answered. She expressed understanding. She is being referred to pulmonology.

## 2019-11-01 NOTE — Progress Notes (Signed)
These results were called to Ria Clock and were reviewed with her. Her questions were answered. She expressed understanding. She has been referred to pulmonology.

## 2019-11-24 ENCOUNTER — Other Ambulatory Visit: Payer: Self-pay

## 2019-11-24 ENCOUNTER — Ambulatory Visit: Payer: BC Managed Care – PPO | Admitting: Internal Medicine

## 2019-11-24 ENCOUNTER — Encounter: Payer: Self-pay | Admitting: Internal Medicine

## 2019-11-24 VITALS — BP 118/88 | HR 82 | Temp 98.2°F | Ht 65.0 in | Wt 169.0 lb

## 2019-11-24 DIAGNOSIS — J449 Chronic obstructive pulmonary disease, unspecified: Secondary | ICD-10-CM

## 2019-11-24 DIAGNOSIS — R0609 Other forms of dyspnea: Secondary | ICD-10-CM | POA: Insufficient documentation

## 2019-11-24 DIAGNOSIS — R0600 Dyspnea, unspecified: Secondary | ICD-10-CM

## 2019-11-24 LAB — SEDIMENTATION RATE: Sed Rate: 4 mm/hr (ref 0–20)

## 2019-11-24 LAB — TSH: TSH: 0.76 u[IU]/mL (ref 0.35–4.50)

## 2019-11-24 LAB — D-DIMER, QUANTITATIVE: D-Dimer, Quant: 0.22 mcg/mL FEU (ref <0.50)

## 2019-11-24 MED ORDER — SPIRIVA RESPIMAT 1.25 MCG/ACT IN AERS: 1.0000 | INHALATION_SPRAY | Freq: Every day | RESPIRATORY_TRACT | 0 refills | Status: DC

## 2019-11-24 MED ORDER — SPIRIVA RESPIMAT 1.25 MCG/ACT IN AERS: 1.0000 | INHALATION_SPRAY | Freq: Every day | RESPIRATORY_TRACT | Status: DC

## 2019-11-24 NOTE — Patient Instructions (Addendum)
Spiriva 1.25 mcg  One puff each am x 5 days then 2 pffs each am   Please remember to go to the lab  department  for your tests - we will call you with the results when they are available with additional testing depending on the results   Please schedule a follow up office visit in 1 week -sooner if needed  with all medications /inhalers/ solutions in hand so we can verify exactly what you are taking. This includes all medications from all doctors and over the counters  - consider 48 h holter/ recheck bnp next ov with repeat walking sats

## 2019-11-24 NOTE — Progress Notes (Addendum)
Joann Meadows, female    DOB: [birth year],   MRN: [id number]   Brief patient profile:  43 yo [place] female who quit smoking 01/25/15 dx  allergy/asthma as child took as shots and by age 2-14 did not having any symtoms and no need for medications and  While visiting [place] 1998, dx AML  [place] > completely recovered and returned to [place] and then [place]  2008 p married American and 2 IUPs last one was May 2018 no problems then dx  March 2020 with R breast Ca > lumpectomy / Rad to R chest with onset of sob which has worsened since RT so referred to pulmonary clinic 11/24/2019 by Sandi Mealy in oncology      History of Present Illness  11/24/2019  Pulmonary/ 1st office eval/Bunnie Rehberg  Chief Complaint  Patient presents with  . Consult    SOB, lower sats  Dyspnea:  Ok at rest unless unless talking/ one aisle at grocery nl pace but tends to rush  Cough: no Sleep: one pillow flat, not waking up  SABA use: could not really tell a difference / made her shaky off for a week / 10 min uncomfortable worse with albuterol and resolves p 10 min assoc with palpitations   No obvious day to day or daytime variability or assoc excess/ purulent sputum or mucus plugs or hemoptysis or cp or chest tightness, subjective wheeze or overt sinus or hb symptoms.   Sleeping  without nocturnal  or early am exacerbation  of respiratory  c/o's or need for noct saba. Also denies any obvious fluctuation of symptoms with weather or environmental changes or other aggravating or alleviating factors except as outlined above   No unusual exposure hx or h/o childhood pna  or knowledge of premature birth.  Current Allergies, Complete Past Medical History, Past Surgical History, Family History, and Social History were reviewed in [employer] record.  ROS  The following are not active complaints unless bolded Hoarseness, sore throat, dysphagia, dental problems, itching, sneezing,  nasal congestion  or discharge of excess mucus or purulent secretions, ear ache,   fever, chills, sweats, unintended wt loss or wt gain, classically pleuritic or exertional cp,  orthopnea pnd or arm/hand swelling  or leg swelling, presyncope, palpitations, abdominal pain, anorexia, nausea, vomiting, diarrhea  or change in bowel habits or change in bladder habits, change in stools or change in urine, dysuria, hematuria,  rash, arthralgias, visual complaints, headache, numbness, weakness or ataxia or problems with walking or coordination,  change in mood= anxious or  memory.            Past Medical History:  Diagnosis Date  . Blood transfusion without reported diagnosis    leaukemia 18 years ago  . Breast cancer ([place]) 2020  . Breast cancer ([place])   . Endometriosis   . Family history of colon cancer   . Family history of leukemia   . Fibroid   . Hx of radiation therapy 06/2019  . Leukemia ([place])    20 years ago  . PONV (postoperative nausea and vomiting)    pt states she vomits after colonoscopies    Outpatient Medications Prior to Visit  Medication Sig Dispense Refill  . acetaminophen (TYLENOL) 325 MG tablet Take 650 mg by mouth every 6 (six) hours as needed for mild pain.     .     5  .     12  .     3  Objective:     BP 118/88 (BP Location: Left Arm, Cuff Size: Normal)   Pulse 82   Temp 98.2 F (36.8 C) (Temporal)   Ht 5\' 5"  (1.651 m)   Wt 169 lb (76.7 kg)   SpO2 99% Comment: RA  BMI 28.12 kg/m   SpO2: 99 %(RA)    Pleasant amb [place] female nad   HEENT : pt wearing mask not removed for exam due to covid -19 concerns.    NECK :  without JVD/Nodes/TM/ nl carotid upstrokes bilaterally   LUNGS: no acc muscle use,  Nl contour chest which is clear to A and P bilaterally without cough on insp or exp maneuvers   CV:  RRR  no s3 or murmur or increase in P2, and no edema   ABD:  soft and nontender with nl inspiratory excursion in the supine position. No bruits or organomegaly  appreciated, bowel sounds nl  MS:  Nl gait/ ext warm without deformities, calf tenderness, cyanosis or clubbing No obvious joint restrictions   SKIN: warm and dry without lesions    NEURO:  alert, approp, nl sensorium with  no motor or cerebellar deficits apparent.    Labs ordered/ reviewed:      Chemistry      Component Value Date/Time   NA 141 10/27/2019 1528   K 3.9 10/27/2019 1528   CL 107 10/27/2019 1528   CO2 25 10/27/2019 1528   BUN 19 10/27/2019 1528   CREATININE 0.84 10/27/2019 1528      Component Value Date/Time   CALCIUM 9.1 10/27/2019 1528   ALKPHOS 59 10/27/2019 1528   AST 13 (L) 10/27/2019 1528   ALT 15 10/27/2019 1528   BILITOT 0.3 10/27/2019 1528        Lab Results  Component Value Date   WBC 6.7 10/27/2019   HGB 13.1 10/27/2019   HCT 40.2 10/27/2019   MCV 91.8 10/27/2019   PLT 220 10/27/2019       EOS                                                              0.2                                     10/27/2019   Lab Results  Component Value Date   DDIMER 0.22 11/24/2019      Lab Results  Component Value Date   TSH 0.76 11/24/2019        Lab Results  Component Value Date   ESRSEDRATE 4 11/24/2019              Assessment   DOE (dyspnea on exertion) Onset spring  2020  p RT for breast ca/ could not tol tamoxifen  > stopped 09/16/2019 - Echo 06/18/2019  1. The left ventricle has normal systolic function, with an ejection fraction of 55-60%. The cavity size was normal. Left ventricular diastolic parameters were normal.  2. The right ventricle has normal systolic function. The cavity was normal. There is no increase in right ventricular wall thickness. Right ventricular systolic pressure could not be assessed.  3. The aortic valve is tricuspid. Mild sclerosis of the aortic valve. Aortic valve regurgitation  was not assessed by color flow Doppler.  4. The aorta is normal in size and structure. - CTa 10/27/2019 no large blood clots/SSA in  bases -11/24/2019   Walked RA x two laps =  approx 560ft @ nl/fast pace - stopped due to end of study, mild sob  with sats of 90% at the end of the study.  Chronic unexplained doe @ [facility]3 = can't walk 100 yards even at a slow pace at a flat grade s stopping due to sob  >>> Symptoms and drop in sats are   disproportionate to other  findings  >>>  pt does appear to have difficult to sort out respiratory symptoms of unknown origin for which  DDX  = almost all start with A and  include Adherence, Ace Inhibitors, Acid Reflux, Active Sinus Disease, Alpha 1 Antitripsin deficiency, Anxiety masquerading as Airways dz,  ABPA,  Allergy(esp in young), Aspiration (esp in elderly), Adverse effects of meds,  Active smoking or Vaping, A bunch of PE's/clot burden (a few small clots can't cause this syndrome unless there is already severe underlying pulm or vascular dz with poor reserve),  Anemia or thyroid disorder, plus two Bs  = Bronchiectasis and Beta blocker use..and one C= CHF   Adherence is always the initial "prime suspect" and is a multilayered concern that requires a "trust but verify" approach in every patient - starting with knowing how to use medications, especially inhalers, correctly, keeping up with refills and understanding the fundamental difference between maintenance and prns vs those medications only taken for a very short course and then stopped and not refilled.  - see [facility] teaching  - return in one week with all meds in hand using a trust but verify approach to confirm accurate Medication  Reconciliation The principal here is that until we are certain that the  patients are doing what we've asked, it makes no sense to ask them to do more.   ? Allergy/asthma/ copd > did not tolerate empirical rx with saba or ics so try very low dose lama (see COPD)   ? Adverse drug effects >  None of the usual suspects listed  ? Anxiety > usually at the bottom of this list of usual suspects but should be  Included  on this pt's based on H and P   and may interfere with adherence and also interpretation of response or lack thereof to symptom management which can be quite subjective.   ? Anemia/ thyroid dz > excluded by labs   ? A bunch of PE's > CTa limited study but d dimer nl - while a normal  or high normal value (seen commonly in the elderly or chronically ill)  may miss small peripheral pe, the clot burden with sob is moderately high and the d dimer  has a very high neg pred value if used in this setting.    ? CHF > excluded by echo though may do bnp next ov if not improving         COPD possible, would be [employer] B Stopped smoking 2016  - 11/24/2019  After extensive coaching inhaler device,  effectiveness =    90% with smi so try spiriva 1.25 mcg one qam x 5 days then 2  q am x 5 days then ov to re-eval walking study and pfts as soon as they can be scheduled    When respiratory symptoms begin or become refractory well after a patient reports complete smoking cessation,  Especially when this wasn't the case while they were smoking, a red flag is raised based on the work of Dr Kris Mouton which states:  if you quit smoking when your best day FEV1 is still well preserved it is highly unlikely you will progress to severe disease.  That is to say, once the smoking stops,  the symptoms should not suddenly erupt or markedly worsen.  If so, the differential diagnosis should include  obesity/deconditioning,  LPR/Reflux/Aspiration syndromes,  occult CHF, or  especially side effect of medications commonly used in this population.   >>> Nevertheless reasonable to try low dose lama since tol saba/ics poorly  And consider empirically starting gerd rx at f/u    Total time devoted to counseling  > 50 % of initial 60 min office visit:  reviewed case with pt/  directly observed portions of ambulatory 02 saturation study/ performed device teaching  using a teach back technique which also  extended face to face time  for this visit (see above) discussion of options/alternatives/ personally creating written customized instructions  in presence of pt  then going over those specific  Instructions directly with the pt including how to use all of the meds but in particular covering each new medication in detail and the difference between the maintenance= "automatic" meds and the prns using an action plan format for the latter (If this problem/symptom => do that organization reading Left to right).  Please see AVS from this visit for a full list of these instructions which I personally wrote for this pt and  are unique to this visit.      Christinia Gully, MD 11/24/2019

## 2019-11-25 ENCOUNTER — Telehealth: Payer: Self-pay | Admitting: *Deleted

## 2019-11-25 ENCOUNTER — Encounter: Payer: Self-pay | Admitting: Internal Medicine

## 2019-11-25 DIAGNOSIS — J449 Chronic obstructive pulmonary disease, unspecified: Secondary | ICD-10-CM | POA: Insufficient documentation

## 2019-11-25 NOTE — Telephone Encounter (Signed)
Pt states she stopped taking tamoxifen in June 2020

## 2019-11-25 NOTE — Assessment & Plan Note (Addendum)
Onset spring  2020  p RT for breast ca/ could not tol tamoxifen > stopped 09/16/2019 - Echo 06/18/2019  1. The left ventricle has normal systolic function, with an ejection fraction of 55-60%. The cavity size was normal. Left ventricular diastolic parameters were normal.  2. The right ventricle has normal systolic function. The cavity was normal. There is no increase in right ventricular wall thickness. Right ventricular systolic pressure could not be assessed.  3. The aortic valve is tricuspid. Mild sclerosis of the aortic valve. Aortic valve regurgitation was not assessed by color flow Doppler.  4. The aorta is normal in size and structure. - CTa 10/27/2019 no large blood clots/SSA in bases -11/24/2019   Walked RA x two laps =  approx 557ft @ nl/fast pace - stopped due to end of study, mild sob  with sats of 90% at the end of the study.  Chronic unexplained doe @ [facility] = can't walk 100 yards even at a slow pace at a flat grade s stopping due to sob  >>> Symptoms and drop in sats are   disproportionate to other  findings  >>>  pt does appear to have difficult to sort out respiratory symptoms of unknown origin for which  DDX  = almost all start with A and  include Adherence, Ace Inhibitors, Acid Reflux, Active Sinus Disease, Alpha 1 Antitripsin deficiency, Anxiety masquerading as Airways dz,  ABPA,  Allergy(esp in young), Aspiration (esp in elderly), Adverse effects of meds,  Active smoking or Vaping, A bunch of PE's/clot burden (a few small clots can't cause this syndrome unless there is already severe underlying pulm or vascular dz with poor reserve),  Anemia or thyroid disorder, plus two Bs  = Bronchiectasis and Beta blocker use..and one C= CHF   Adherence is always the initial "prime suspect" and is a multilayered concern that requires a "trust but verify" approach in every patient - starting with knowing how to use medications, especially inhalers, correctly, keeping up with refills and understanding  the fundamental difference between maintenance and prns vs those medications only taken for a very short course and then stopped and not refilled.  - see [facility] teaching  - return in one week with all meds in hand using a trust but verify approach to confirm accurate Medication  Reconciliation The principal here is that until we are certain that the  patients are doing what we've asked, it makes no sense to ask them to do more.   ? Allergy/asthma/ copd > did not tolerate empirical rx with saba or ics so try very low dose lama (see COPD)   ? Adverse drug effects >  None of the usual suspects listed  ? Anxiety > usually at the bottom of this list of usual suspects but should be  Included on this pt's based on H and P   and may interfere with adherence and also interpretation of response or lack thereof to symptom management which can be quite subjective.   ? Anemia/ thyroid dz > excluded by labs   ? A bunch of PE's > CTa limited study but d dimer nl - while a normal  or high normal value (seen commonly in the elderly or chronically ill)  may miss small peripheral pe, the clot burden with sob is moderately high and the d dimer  has a very high neg pred value if used in this setting.    ? CHF > excluded by echo though may do bnp next ov  if not improving

## 2019-11-25 NOTE — Assessment & Plan Note (Signed)
Stopped smoking 2016  - 11/24/2019  After extensive coaching inhaler device,  effectiveness =    90% with smi so try spiriva 1.25 mcg one qam x 5 days then 2  q am x 5 days then ov to re-eval walking study and pfts as soon as they can be scheduled    When respiratory symptoms begin or become refractory well after a patient reports complete smoking cessation,  Especially when this wasn't the case while they were smoking, a red flag is raised based on the work of Dr Kris Mouton which states:  if you quit smoking when your best day FEV1 is still well preserved it is highly unlikely you will progress to severe disease.  That is to say, once the smoking stops,  the symptoms should not suddenly erupt or markedly worsen.  If so, the differential diagnosis should include  obesity/deconditioning,  LPR/Reflux/Aspiration syndromes,  occult CHF, or  especially side effect of medications commonly used in this population.   >>> Nevertheless reasonable to try low dose lama since tol saba/ics poorly  And consider empirically starting gerd rx at f/u    Total time devoted to counseling  > 50 % of initial 60 min office visit:  reviewed case with pt/  directly observed portions of ambulatory 02 saturation study/ performed device teaching  using a teach back technique which also  extended face to face time for this visit (see above) discussion of options/alternatives/ personally creating written customized instructions  in presence of pt  then going over those specific  Instructions directly with the pt including how to use all of the meds but in particular covering each new medication in detail and the difference between the maintenance= "automatic" meds and the prns using an action plan format for the latter (If this problem/symptom => do that organization reading Left to right).  Please see AVS from this visit for a full list of these instructions which I personally wrote for this pt and  are unique to this visit.

## 2019-11-25 NOTE — Progress Notes (Signed)
Spoke with pt and notified of results per Dr. Wert. Pt verbalized understanding and denied any questions. 

## 2019-11-25 NOTE — Telephone Encounter (Signed)
-----   Message from Tanda Rockers, MD sent at 11/25/2019  5:40 AM EST ----- Find out when exactly she stopped tamoxifen

## 2019-11-29 ENCOUNTER — Other Ambulatory Visit: Payer: Self-pay

## 2019-11-29 ENCOUNTER — Encounter: Payer: Self-pay | Admitting: *Deleted

## 2019-11-29 ENCOUNTER — Encounter: Payer: Self-pay | Admitting: Internal Medicine

## 2019-11-29 ENCOUNTER — Ambulatory Visit (INDEPENDENT_AMBULATORY_CARE_PROVIDER_SITE_OTHER): Payer: BC Managed Care – PPO | Admitting: Internal Medicine

## 2019-11-29 VITALS — BP 118/78 | HR 71 | Temp 98.3°F | Ht 65.0 in | Wt 167.0 lb

## 2019-11-29 DIAGNOSIS — R0609 Other forms of dyspnea: Secondary | ICD-10-CM

## 2019-11-29 DIAGNOSIS — R0600 Dyspnea, unspecified: Secondary | ICD-10-CM

## 2019-11-29 MED ORDER — PANTOPRAZOLE SODIUM 40 MG PO TBEC: 40.0000 mg | DELAYED_RELEASE_TABLET | Freq: Every day | ORAL | 2 refills | Status: DC

## 2019-11-29 MED ORDER — FAMOTIDINE 20 MG PO TABS: ORAL_TABLET | ORAL | 11 refills | Status: DC

## 2019-11-29 NOTE — Progress Notes (Signed)
Joann Meadows, female    DOB: [birth year],   MRN: [id number]   Brief patient profile:  44 yo [place] female who quit smoking 01/25/15 dx  allergy/asthma as child took as shots and by age 60-14 did not having any symtoms and no need for medications and  While visiting [place] 1998, dx AML  [place] > completely recovered and returned to [place] and then [place]  2008 p married [place] and 2 IUPs last one was May 2018 no problems then dx  March 2020 with R breast Ca > lumpectomy / Rad to R chest with onset of sob which has worsened since RT complete 06/17/2019  so referred to pulmonary clinic 11/24/2019 by Sandi Mealy in oncology with     History of Present Illness  11/24/2019  Pulmonary/ 1st office eval/Maguire Sime  Chief Complaint  Patient presents with  . Consult    SOB, lower sats  Dyspnea:  Ok at rest unless unless talking/ one aisle at grocery nl pace but tends to rush  Cough: no Sleep: one pillow flat, not waking up  SABA use: could not really tell a difference / made her shaky off for a week / 10 min uncomfortable worse with albuterol and resolves p 10 min assoc with palpitations. rec Spiriva 1.25 mcg  One puff each am x 5 days then 2 pffs each am   Please schedule a follow up office visit in 1 week -sooner if needed  with all medications /inhalers/ solutions in hand so we can verify exactly what you are taking. This includes all medications from all doctors and over the counters  - consider 48 h holter/ recheck bnp next ov with repeat walking sats      11/29/2019  f/u ov/[name] re: doe sob at rest if talking / one aisle at grocery if not  Chief Complaint  Patient presents with  . Follow-up    SOB   Dyspnea:  One aisle at grocery store  Cough: none  Sleeping: no resp problems one pillow flat bed SABA use: none  02: none  Palpitations come and go s pattern no worse with activity  Today mentions new midline ant  cp same time frame, no worse with exertion, pain can last all day x  really bad x last few days prior to OV   not worse  with coughing no change with eating or leaning forward but some better supine    No obvious day to day or daytime variability or assoc excess/ purulent sputum or mucus plugs or hemoptysis   or chest tightness, subjective wheeze or overt sinus or hb symptoms.   Sleeping  without nocturnal  or early am exacerbation  of respiratory  c/o's or need for noct saba. Also denies any obvious fluctuation of symptoms with weather or environmental changes or other aggravating or alleviating factors except as outlined above   No unusual exposure hx or h/o childhood pna/ asthma or knowledge of premature birth.  Current Allergies, Complete Past Medical History, Past Surgical History, Family History, and Social History were reviewed in [employer] record.  ROS  The following are not active complaints unless bolded Hoarseness, sore throat, dysphagia, dental problems, itching, sneezing,  nasal congestion or discharge of excess mucus or purulent secretions, ear ache,   fever, chills, sweats, unintended wt loss or wt gain, classically pleuritic or exertional cp,  orthopnea pnd or arm/hand swelling  or leg swelling, presyncope, palpitations, abdominal pain, anorexia, nausea, vomiting, diarrhea  or change in bowel habits or change in bladder habits, change in stools or change in urine, dysuria, hematuria,  rash, arthralgias, visual complaints, headache, numbness, weakness or ataxia or problems with walking or coordination,  change in mood- anxious or  memory.        Current Meds  Medication Sig  . acetaminophen (TYLENOL) 325 MG tablet Take 650 mg by mouth every 6 (six) hours as needed for mild pain.   . Tiotropium Bromide Monohydrate (SPIRIVA RESPIMAT) 1.25 MCG/ACT AERS Inhale 2 puff into the lungs daily.  .                Past Medical History:  Diagnosis Date  . Blood transfusion without reported diagnosis    leaukemia 18 years ago    . Breast cancer ([place]) 2020  . Breast cancer ([place])   . Endometriosis   . Family history of colon cancer   . Family history of leukemia   . Fibroid   . Hx of radiation therapy 06/2019  . Leukemia ([place])    20 years ago  . PONV (postoperative nausea and vomiting)    pt states she vomits after colonoscopies       Objective:    Somber amb [place] female nad   Wt Readings from Last 3 Encounters:  11/29/19 167 lb (75.8 kg)  11/24/19 169 lb (76.7 kg)  10/27/19 166 lb 3.2 oz (75.4 kg)     Vital signs reviewed - Note on arrival 02 sats  96% on RA       HEENT : pt wearing mask not removed for exam due to covid -19 concerns.    NECK :  without JVD/Nodes/TM/ nl carotid upstrokes bilaterally   LUNGS: no acc muscle use,  Nl contour chest which is clear to A and P bilaterally without cough on insp or exp maneuvers   CV:  RRR  no s3 or murmur or increase in P2, and no edema   ABD:  soft and nontender with nl inspiratory excursion in the supine position. No bruits or organomegaly appreciated, bowel sounds nl  MS:  Nl gait/ ext warm without deformities, calf tenderness, cyanosis or clubbing No obvious joint restrictions   SKIN: warm and dry without lesions    NEURO:  alert, approp, nl sensorium with  no motor or cerebellar deficits apparent.           Assessment

## 2019-11-29 NOTE — Patient Instructions (Addendum)
Stop spiriva  Pantoprazole (protonix) 40 mg   Take  30-60 min before first meal of the day and Pepcid (famotidine)  20 mg one after supper  until return to office - this is the best way to tell whether stomach acid is contributing to your problem.    GERD (REFLUX)  is an extremely common cause of respiratory symptoms just like yours , many times with no obvious heartburn at all.    It can be treated with medication, but also with lifestyle changes including elevation of the head of your bed (ideally with 6 -8inch blocks under the headboard of your bed),  Smoking cessation, avoidance of late meals, excessive alcohol, and avoid fatty foods, chocolate, peppermint, colas, red wine, and acidic juices such as orange juice.  NO MINT OR MENTHOL PRODUCTS SO NO COUGH DROPS  USE SUGARLESS CANDY INSTEAD (Jolley ranchers or Stover's or Life Savers) or even ice chips will also do - the key is to swallow to prevent all throat clearing. NO OIL BASED VITAMINS - use powdered substitutes.  Avoid fish oil when coughing.   Please schedule a follow up office visit in 2 weeks, sooner if needed  with all medications /inhalers/ solutions in hand so we can verify exactly what you are taking. This includes all medications from all doctors and over the counters

## 2019-11-30 ENCOUNTER — Encounter: Payer: Self-pay | Admitting: Internal Medicine

## 2019-11-30 ENCOUNTER — Telehealth: Payer: Self-pay | Admitting: Internal Medicine

## 2019-11-30 NOTE — Telephone Encounter (Signed)
Patient aware meds were called in

## 2019-11-30 NOTE — Telephone Encounter (Signed)
Per chart, both meds were called in yesterday and we received a confirmation from the pharmacy. Called pharmacy who confirmed that these meds are ready for patient pickup. lmtcb X1 for pt to let her know that her meds are ready for pickup at the pharmacy.

## 2019-11-30 NOTE — Assessment & Plan Note (Addendum)
Onset spring  2020  p RT for breast ca/ could not tol tamoxifen > stopped 04/2019 per pt  - Echo 06/18/2019  1. The left ventricle has normal systolic function, with an ejection fraction of 55-60%. The cavity size was normal. Left ventricular diastolic parameters were normal.  2. The right ventricle has normal systolic function. The cavity was normal. There is no increase in right ventricular wall thickness. Right ventricular systolic pressure could not be assessed.  3. The aortic valve is tricuspid. Mild sclerosis of the aortic valve. Aortic valve regurgitation was not assessed by color flow Doppler.  4. The aorta is normal in size and structure. - CTa 10/27/2019 no large blood clots/SSA in bases -11/24/2019   Walked RA x two laps =  approx 561ft @ nl/fast pace - stopped due to end of study, mild sob  with sats of 90% at the end of the study. - 11/29/2019   Walked RA x two laps =  approx 539ft @ fast pace - stopped due to end of study, no sob, some cp with nl ekg  with sats of 98% at the end of the study.  She had not previously mentioned  Midline cp but is no better with lama and can't tolerate  saba or ICS so I strongly doubt asthma and nl ekg with pain present and no consistent pattern with exertion with nl echo (no pericarditis evident) all points to me to panic disorder vs GERD   rec trial of GERD rx, then consider cpst next   I had an extended discussion with the patient and reviewed all relevant studies so total time was 30 minutes with moderate level of MDM.  Each maintenance medication was reviewed in detail including most importantly the difference between maintenance and prns and under what circumstances the prns are to be triggered using an action plan format that is not reflected in the computer generated alphabetically organized AVS.     Please see AVS for specific instructions unique to this visit that I personally wrote and verbalized to the the pt in detail and then reviewed with pt   by my nurse highlighting any  changes in therapy recommended at today's visit to their plan of care.

## 2019-12-05 DIAGNOSIS — Z20828 Contact with and (suspected) exposure to other viral communicable diseases: Secondary | ICD-10-CM | POA: Diagnosis not present

## 2019-12-05 DIAGNOSIS — Z20822 Contact with and (suspected) exposure to covid-19: Secondary | ICD-10-CM | POA: Diagnosis not present

## 2019-12-07 DIAGNOSIS — Z20828 Contact with and (suspected) exposure to other viral communicable diseases: Secondary | ICD-10-CM | POA: Diagnosis not present

## 2019-12-13 ENCOUNTER — Ambulatory Visit: Payer: BC Managed Care – PPO | Admitting: Pulmonary Disease

## 2019-12-31 ENCOUNTER — Ambulatory Visit: Payer: BC Managed Care – PPO | Admitting: Internal Medicine

## 2019-12-31 ENCOUNTER — Encounter: Payer: Self-pay | Admitting: Internal Medicine

## 2019-12-31 ENCOUNTER — Other Ambulatory Visit: Payer: Self-pay

## 2019-12-31 DIAGNOSIS — R0609 Other forms of dyspnea: Secondary | ICD-10-CM

## 2019-12-31 DIAGNOSIS — R0600 Dyspnea, unspecified: Secondary | ICD-10-CM

## 2019-12-31 DIAGNOSIS — J449 Chronic obstructive pulmonary disease, unspecified: Secondary | ICD-10-CM | POA: Diagnosis not present

## 2019-12-31 NOTE — Patient Instructions (Addendum)
We will call to schedule you a CPST in about 2 weeks no sooner    To get the most out of exercise, you need to be continuously aware that you are short of breath, but never out of breath, for 30 minutes daily. As you improve, it will actually be easier for you to do the same amount of exercise  in  30 minutes so always push to the level where you are short of breath.

## 2019-12-31 NOTE — Progress Notes (Signed)
Joann Meadows, female    DOB: 04-28-1976,   MRN: [id number]   Brief patient profile:  44 yo [place] female who quit smoking 01/25/15 dx  allergy/asthma as child took as shots and by age 29-14 did not having any symtoms and no need for medications and  While visiting [place] 1998, dx AML  [place] > completely recovered and returned to [place] and then [place]  2008 p married [place] and 2 IUPs last one was May 2018 no problems then dx  March 2020 with R breast Ca > lumpectomy / Rad to R chest with onset of sob which has worsened since RT complete 06/17/2019  so referred to pulmonary clinic 11/24/2019 by Joann Meadows in oncology     History of Present Illness  11/24/2019  Pulmonary/ 1st office eval/Joann Meadows re sob since 01/2019  Chief Complaint  Patient presents with  . Consult    SOB, lower sats  Dyspnea:  Ok at rest unless unless talking/ one aisle at grocery nl pace but tends to rush  Cough: no Sleep: one pillow flat, not waking up  SABA use: could not really tell a difference / made her shaky so  off for a week / 10 min uncomfortable worse with albuterol and resolves p 10 min assoc with palpitations. rec Spiriva 1.25 mcg  One puff each am x 5 days then 2 pffs each am  Please schedule a follow up office visit in 1 week -sooner if needed  with all medications /inhalers/ solutions in hand so we can verify exactly what you are taking. This includes all medications from all doctors and over the counters      11/29/2019  f/u ov/Joann Meadows re: doe sob at rest if talking / one aisle at grocery if not  Chief Complaint  Patient presents with  . Follow-up    SOB   Dyspnea:  One aisle at grocery store  Cough: none  Sleeping: no resp problems one pillow flat bed SABA use: none  02: none  Palpitations come and go s pattern no worse with activity  Today mentions new midline ant  cp same time frame, no worse with exertion, pain can last all day x really bad x last few days prior to OV   not worse   with coughing no change with eating or leaning forward but some better supine  rec Stop spiriva Pantoprazole (protonix) 40 mg   Take  30-60 min before first meal of the day and Pepcid (famotidine)  20 mg one after supper  until return to office - this is the best way to tell whether stomach acid is contributing to your problem.   GERD  Diet Please schedule a follow up office visit in 2 weeks, sooner if needed  with all medications /inhalers/ solutions in hand so we can verify exactly what you are taking. This includes all medications from all doctors and over the counters   12/31/2019  f/u ov/Joann Meadows re: doex sob since 01/2019  only sltly improved on gerd rx  certainly no worse off spiriva / did not bring meds  Chief Complaint  Patient presents with  . Follow-up    Patient is here for follow up for shortness of breath with exertion. Patient states that her breathing has got a little better since last visit.   Dyspnea:  No aerobics / feels sob at top of steps and lasts for 5 min s cp Cough: no  Sleeping: flat bed/ one pillow comfortable  SABA use: none 02: none  Cp varies,  Midline 10-15 min when tired/ no better with gerd rx and not pleuritic or ex related Palpitations occur several times a week last few minutes not related to activity     No obvious patterns in  day to day or daytime variability of chest symptoms or assoc excess/ purulent sputum or mucus plugs or hemoptysis or   chest tightness, subjective wheeze or overt sinus or hb symptoms.   Sleeping ok as above without nocturnal  or early am exacerbation  of respiratory  c/o's or need for noct saba. Also denies any obvious fluctuation of symptoms with weather or environmental changes or other aggravating or alleviating factors except as outlined above   No unusual exposure hx or h/o childhood pna  or knowledge of premature birth.  Current Allergies, Complete Past Medical History, Past Surgical History, Family History, and Social History  were reviewed in [employer] record.  ROS  The following are not active complaints unless bolded Hoarseness, sore throat, dysphagia, dental problems, itching, sneezing,  nasal congestion or discharge of excess mucus or purulent secretions, ear ache,   fever, chills, sweats, unintended wt loss or wt gain, classically pleuritic or exertional cp,  orthopnea pnd or arm/hand swelling  or leg swelling, presyncope, palpitations, abdominal pain, anorexia, nausea, vomiting, diarrhea  or change in bowel habits or change in bladder habits, change in stools or change in urine, dysuria, hematuria,  rash, arthralgias, visual complaints, headache, numbness, weakness or ataxia or problems with walking or coordination,  change in mood or  memory.        Current Meds  Medication Sig  . acetaminophen (TYLENOL) 325 MG tablet Take 650 mg by mouth every 6 (six) hours as needed for mild pain.                   Past Medical History:  Diagnosis Date  . Blood transfusion without reported diagnosis    leaukemia 18 years ago  . Breast cancer ([place]) 2020  . Breast cancer ([place])   . Endometriosis   . Family history of colon cancer   . Family history of leukemia   . Fibroid   . Hx of radiation therapy 06/2019  . Leukemia ([place])    20 years ago  . PONV (postoperative nausea and vomiting)    pt states she vomits after colonoscopies       Objective:    Somber [place] female nad   Vital signs reviewed  12/31/2019  - Note at rest 02 sats  96% on RA     12/31/2019         164   11/29/19 167 lb (75.8 kg)  11/24/19 169 lb (76.7 kg)  10/27/19 166 lb 3.2 oz (75.4 kg)         HEENT : pt wearing mask not removed for exam due to covid -19 concerns.    NECK :  without JVD/Nodes/TM/ nl carotid upstrokes bilaterally   LUNGS: no acc muscle use,  Nl contour chest which is clear to A and P bilaterally without cough on insp or exp maneuvers   CV:  RRR  no s3 or murmur or increase in P2, and  no edema   ABD:  soft and nontender with nl inspiratory excursion in the supine position. No bruits or organomegaly appreciated, bowel sounds nl  MS:  Nl gait/ ext warm without deformities, calf tenderness, cyanosis or clubbing No obvious joint restrictions  SKIN: warm and dry without lesions    NEURO:  alert, approp, nl sensorium with  no motor or cerebellar deficits apparent.           Assessment

## 2020-01-01 ENCOUNTER — Encounter: Payer: Self-pay | Admitting: Internal Medicine

## 2020-01-01 NOTE — Assessment & Plan Note (Signed)
Stopped smoking 2016  - 11/24/2019  After extensive coaching inhaler device,  effectiveness =    90% with smi so try spiriva 1.25 mcg one qam x 5 days then 2  q am x 5 days then ov to re-eval walking study and pfts as soon as they can be scheduled  - no better on spiriva so d/c'd 11/29/19   Spirometry to be done as part of cpst with post ex fev1 to r/o ex induced asthma

## 2020-01-01 NOTE — Assessment & Plan Note (Signed)
Onset spring  2020  p RT for breast ca/ could not tol tamoxifen > stopped 04/2019 per pt  - Echo 06/18/2019  1. The left ventricle has normal systolic function, with an ejection fraction of 55-60%. The cavity size was normal. Left ventricular diastolic parameters were normal.  2. The right ventricle has normal systolic function. The cavity was normal. There is no increase in right ventricular wall thickness. Right ventricular systolic pressure could not be assessed.  3. The aortic valve is tricuspid. Mild sclerosis of the aortic valve. Aortic valve regurgitation was not assessed by color flow Doppler.  4. The aorta is normal in size and structure. - CTa 10/27/2019 no large blood clots/SSA in bases -11/24/2019   Walked RA x two laps =  approx 510ft @ nl/fast pace - stopped due to end of study, mild sob  with sats of 90% at the end of the study. - 11/29/2019   Walked RA x two laps =  approx 560ft @ fast pace - stopped due to end of study, no sob, some cp with nl ekg  with sats of 98% at the end of the study. - 12/31/2019   Walked RA x two laps =  approx 536ft @ fast pace - stopped due to end of study, no sob, no cp  with sats of 97 % at the end of the study. - CPST   Advised reconditioning ex then cpst p 2 weeks   Discussed in detail all the  indications, usual  risks and alternatives  relative to the benefits with patient who agrees to proceed with w/u as outlined.  No change in meds in meantime         Each maintenance medication was reviewed in detail including emphasizing most importantly the difference between maintenance and prns and under what circumstances the prns are to be triggered using an action plan format where appropriate.   Medical decision making was a moderate level of complexity in this case because of  two chronic conditions /diagnoses (cp,sob) requiring extra time for  H and P, chart review, counseling,  directly observing portions of ambulatory 02 saturation study/    and  generating customized AVS unique to this office visit and charting.   Each maintenance medication was reviewed in detail including emphasizing most importantly the difference between maintenance and prns and under what circumstances the prns are to be triggered using an action plan format where appropriate. Please see avs for details which were reviewed in writing by both me and my nurse and patient given a written copy highlighted where appropriate with yellow highlighter for the patient's continued care at home along with an updated version of their medications.  Patient was asked to maintain medication reconciliation by comparing this list to the actual medications being used at home and to contact this office right away if there is a conflict or discrepancy.

## 2020-01-02 DIAGNOSIS — Z20822 Contact with and (suspected) exposure to covid-19: Secondary | ICD-10-CM | POA: Diagnosis not present

## 2020-01-03 ENCOUNTER — Telehealth: Payer: Self-pay | Admitting: *Deleted

## 2020-01-03 DIAGNOSIS — R0600 Dyspnea, unspecified: Secondary | ICD-10-CM

## 2020-01-03 DIAGNOSIS — R0609 Other forms of dyspnea: Secondary | ICD-10-CM

## 2020-01-03 NOTE — Telephone Encounter (Signed)
-----   Message from Tanda Rockers, MD sent at 01/01/2020  6:20 AM EST ----- Needs cpst with spirometry before and after to be done p Feb 21

## 2020-01-03 NOTE — Telephone Encounter (Signed)
Patient is returning phone call.  Patient phone number is [phone].

## 2020-01-03 NOTE — Telephone Encounter (Signed)
LMTCB- need to inform her of test MW wants her to have before we order

## 2020-01-03 NOTE — Telephone Encounter (Signed)
Spoke with the pt and notified MW wants to order CPST  She was agreeable to this  I have placed order

## 2020-01-05 DIAGNOSIS — Z01419 Encounter for gynecological examination (general) (routine) without abnormal findings: Secondary | ICD-10-CM | POA: Diagnosis not present

## 2020-01-05 DIAGNOSIS — Z6829 Body mass index (BMI) 29.0-29.9, adult: Secondary | ICD-10-CM | POA: Diagnosis not present

## 2020-01-07 ENCOUNTER — Other Ambulatory Visit: Payer: Self-pay

## 2020-01-07 ENCOUNTER — Ambulatory Visit
Admission: RE | Admit: 2020-01-07 | Discharge: 2020-01-07 | Disposition: A | Discharge status: Home / Self Care | Payer: BC Managed Care – PPO | Source: Ambulatory Visit | Attending: Hematology and Oncology | Admitting: Hematology and Oncology

## 2020-01-07 ENCOUNTER — Other Ambulatory Visit (HOSPITAL_COMMUNITY)
Admission: RE | Admit: 2020-01-07 | Discharge: 2020-01-07 | Disposition: A | Discharge status: Home / Self Care | Payer: BC Managed Care – PPO | Source: Ambulatory Visit | Attending: Internal Medicine | Admitting: Internal Medicine

## 2020-01-07 DIAGNOSIS — Z20822 Contact with and (suspected) exposure to covid-19: Secondary | ICD-10-CM | POA: Insufficient documentation

## 2020-01-07 DIAGNOSIS — Z01812 Encounter for preprocedural laboratory examination: Secondary | ICD-10-CM | POA: Insufficient documentation

## 2020-01-07 DIAGNOSIS — D0511 Intraductal carcinoma in situ of right breast: Secondary | ICD-10-CM

## 2020-01-07 DIAGNOSIS — Z853 Personal history of malignant neoplasm of breast: Secondary | ICD-10-CM | POA: Diagnosis not present

## 2020-01-07 DIAGNOSIS — R922 Inconclusive mammogram: Secondary | ICD-10-CM | POA: Diagnosis not present

## 2020-01-07 LAB — SARS CORONAVIRUS 2 (TAT 6-24 HRS): SARS Coronavirus 2: NEGATIVE

## 2020-01-10 ENCOUNTER — Encounter (HOSPITAL_COMMUNITY): Payer: BC Managed Care – PPO

## 2020-02-05 ENCOUNTER — Ambulatory Visit: Payer: BC Managed Care – PPO | Attending: Internal Medicine

## 2020-02-05 DIAGNOSIS — Z23 Encounter for immunization: Secondary | ICD-10-CM

## 2020-02-05 NOTE — Progress Notes (Signed)
   Covid-19 Vaccination Clinic  Name:  Nurah Boeger    MRN: [id number] DOB: 1976-10-01  02/05/2020  Ms. Hulton was observed post Covid-19 immunization for 15 minutes without incident. She was provided with Vaccine Information Sheet and instruction to access the V-Safe system.   Ms. Denapoli was instructed to call 911 with any severe reactions post vaccine: Marland Kitchen Difficulty breathing  . Swelling of face and throat  . A fast heartbeat  . A bad rash all over body  . Dizziness and weakness   Immunizations Administered    Name Date Dose VIS Date Route   Pfizer COVID-19 Vaccine 02/05/2020  3:51 PM 0.3 mL 11/05/2019 Intramuscular   Manufacturer: [place]   Lot: [id number]   [place]: [id number]

## 2020-02-07 ENCOUNTER — Other Ambulatory Visit (HOSPITAL_COMMUNITY)
Admission: RE | Admit: 2020-02-07 | Discharge: 2020-02-07 | Disposition: A | Discharge status: Home / Self Care | Payer: BC Managed Care – PPO | Source: Ambulatory Visit | Attending: Internal Medicine | Admitting: Internal Medicine

## 2020-02-07 DIAGNOSIS — Z20822 Contact with and (suspected) exposure to covid-19: Secondary | ICD-10-CM | POA: Insufficient documentation

## 2020-02-07 DIAGNOSIS — Z01812 Encounter for preprocedural laboratory examination: Secondary | ICD-10-CM | POA: Diagnosis not present

## 2020-02-07 LAB — SARS CORONAVIRUS 2 (TAT 6-24 HRS): SARS Coronavirus 2: NEGATIVE

## 2020-02-08 ENCOUNTER — Ambulatory Visit (HOSPITAL_COMMUNITY): Payer: BC Managed Care – PPO | Attending: Cardiology

## 2020-02-08 ENCOUNTER — Other Ambulatory Visit: Payer: Self-pay

## 2020-02-08 ENCOUNTER — Other Ambulatory Visit (HOSPITAL_COMMUNITY): Payer: Self-pay | Admitting: *Deleted

## 2020-02-08 DIAGNOSIS — R0609 Other forms of dyspnea: Secondary | ICD-10-CM

## 2020-02-08 DIAGNOSIS — R0600 Dyspnea, unspecified: Secondary | ICD-10-CM

## 2020-02-09 NOTE — Progress Notes (Signed)
Spoke with pt and notified of results per Dr. Wert. Pt verbalized understanding and denied any questions. 

## 2020-02-21 ENCOUNTER — Other Ambulatory Visit: Payer: Self-pay | Admitting: Internal Medicine

## 2020-03-01 ENCOUNTER — Ambulatory Visit: Payer: BC Managed Care – PPO | Attending: Internal Medicine

## 2020-03-01 DIAGNOSIS — Z23 Encounter for immunization: Secondary | ICD-10-CM

## 2020-03-01 NOTE — Progress Notes (Signed)
   Covid-19 Vaccination Clinic  Name:  Joann Meadows    MRN: [id number] DOB: May 18, 1976  03/01/2020  [name] was observed post Covid-19 immunization for 15 minutes without incident. She was provided with Vaccine Information Sheet and instruction to access the V-Safe system.   [name] was instructed to call 911 with any severe reactions post vaccine: Marland Kitchen Difficulty breathing  . Swelling of face and throat  . A fast heartbeat  . A bad rash all over body  . Dizziness and weakness   Immunizations Administered    Name Date Dose VIS Date Route   Pfizer COVID-19 Vaccine 03/01/2020 11:25 AM 0.3 mL 11/05/2019 Intramuscular   Manufacturer: [employer]   Lot: [id number]   [place]: [id number]

## 2020-03-21 IMAGING — CT CT ANGIOGRAPHY NECK
1 of 11 series · 5 of 33 positions shown · IV contrast (APPLIED)
Comparison: None.

CLINICAL DATA: Breast cancer patient with speech disturbance over
the last week. Right-sided facial numbness.

EXAM:
CT ANGIOGRAPHY HEAD AND NECK
TECHNIQUE: Multidetector CT imaging of the head and neck was performed using
the standard protocol during bolus administration of intravenous
contrast. Multiplanar CT image reconstructions and MIPs were
obtained to evaluate the vascular anatomy. Carotid stenosis
measurements (when applicable) are obtained utilizing NASCET
criteria, using the distal internal carotid diameter as the
denominator.
CONTRAST:  100mL OMNIPAQUE IOHEXOL 350 MG/ML SOLN

[Series 9: ax thins · axial · 0.39mm/px · z∈[-240,-24]mm · 5 of 324 slices shown]
[im 54/324  soft-tissue]
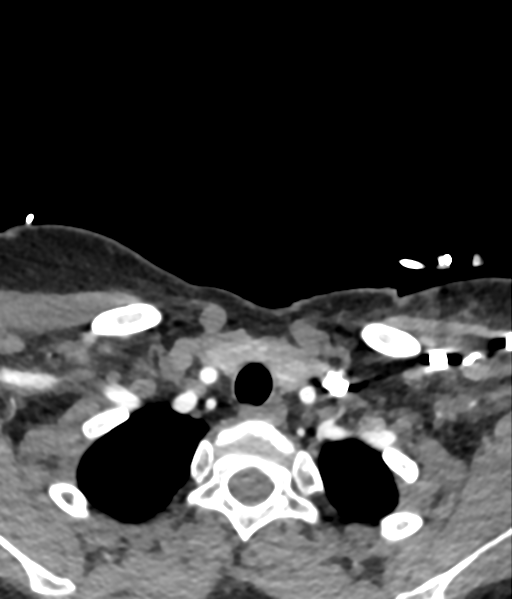
[im 108/324  bone]
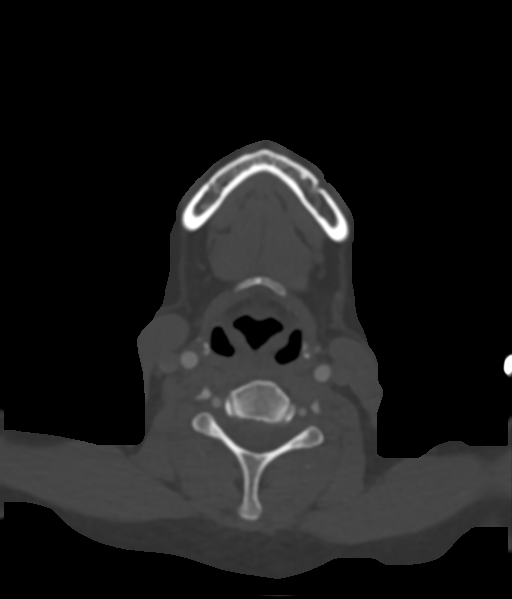
[im 162/324  soft-tissue]
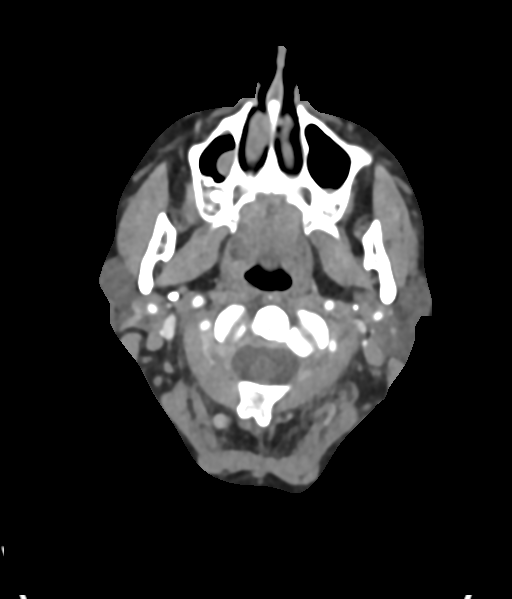
[im 216/324  bone]
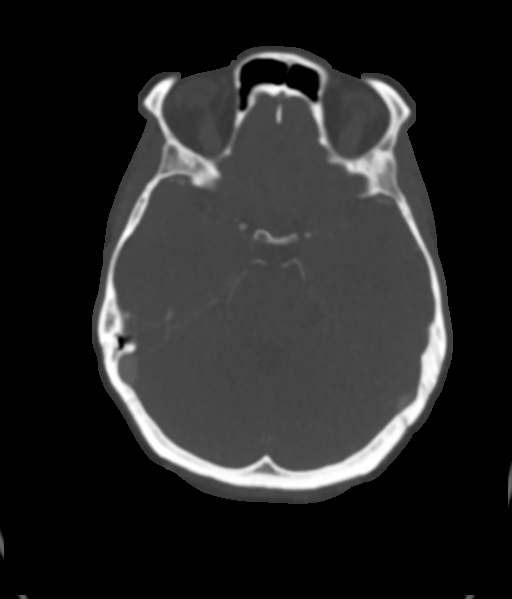
[im 270/324  soft-tissue]
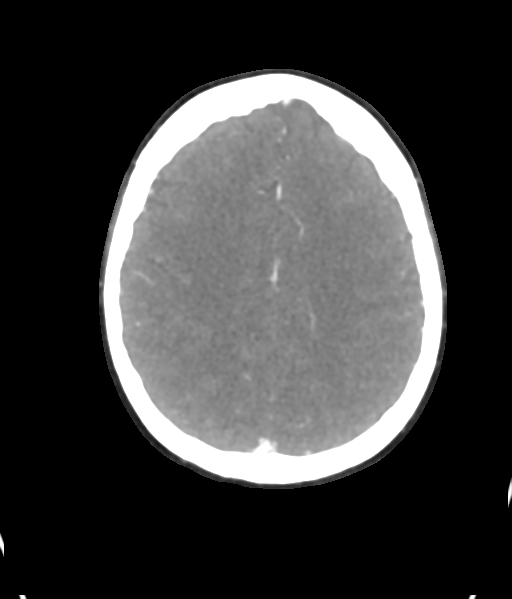

[5 of 33 positions shown; findings below may reference images not displayed]

FINDINGS: CT HEAD FINDINGS

Brain: The brain shows a normal appearance without evidence of
malformation, atrophy, old or acute small or large vessel
infarction, mass lesion, hemorrhage, hydrocephalus or extra-axial
collection.

Vascular: No hyperdense vessel. No evidence of atherosclerotic
calcification.

Skull: Normal.  No traumatic finding.  No focal bone lesion.

Sinuses/Orbits: Sinuses are clear. Orbits appear normal. Mastoids
are clear.

Other: None significant

CTA NECK FINDINGS

Aortic arch: Normal

Right carotid system: Common carotid artery widely patent to the
bifurcation. No soft or calcified plaque at the carotid bifurcation.
Cervical ICA is normal.

Left carotid system: Common carotid artery widely patent to the
bifurcation. No soft or calcified plaque at the carotid bifurcation.
Cervical ICA is normal.

Vertebral arteries: Both vertebral arteries are widely patent at
their origins and through the cervical region to the foramen magnum.

Skeleton: Normal

Other neck: No mass or lymphadenopathy.

Upper chest: Normal

Review of the MIP images confirms the above findings

CTA HEAD FINDINGS

Anterior circulation: Both internal carotid arteries are widely
patent through the skull base and siphon regions. The anterior and
middle cerebral vessels are patent without stenosis, aneurysm or
vascular malformation. No vascular occlusion.

Posterior circulation: Both vertebral arteries are widely patent to
the basilar. No basilar stenosis. Posterior circulation branch
vessels are normal.

Venous sinuses: Patent and normal.

Anatomic variants: None

Delayed phase: Not performed.

Review of the MIP images confirms the above findings
IMPRESSION: Normal examinations. Normal CT appearance of the brain. Normal CT
angiogram appearance of the cerebrovascular system.

## 2020-03-21 IMAGING — MR MRI HEAD WITHOUT CONTRAST
11 of 14 series · 35 of 48 positions shown · non-contrast
Comparison: CT studies same day

CLINICAL DATA: History of breast cancer. Lethargy and weakness.
Tingling sensation. Right face and arm numbness.

EXAM:
MRI HEAD WITHOUT CONTRAST
TECHNIQUE: Multiplanar, multiecho pulse sequences of the brain and surrounding
structures were obtained without intravenous contrast.

[Series 3: DWI · sagittal · 4.0mm · 0.94mm/px · 4 of 70 slices shown (1 of 3)]
[im 1/70]
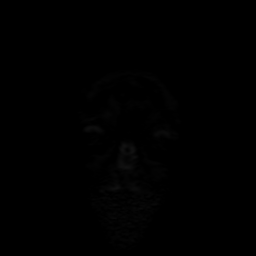
[im 24/70]
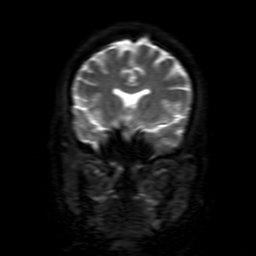
[im 47/70]
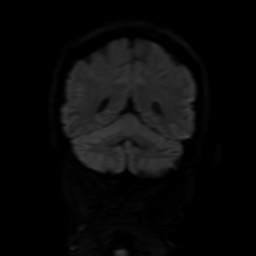
[im 70/70]
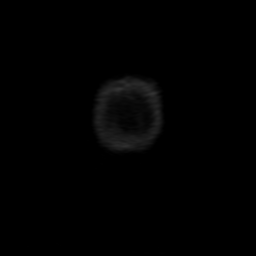

[Series 4: T2 · axial · 5.0mm · 0.47mm/px · 1 of 25 slices shown (1 of 2)]
[im 1/25]
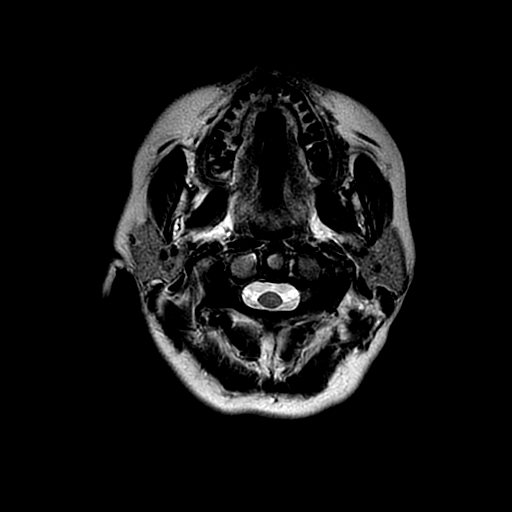

[Series 5: FLAIR · axial · 5.0mm · 0.47mm/px · z∈[-61,+83]mm · 2 of 25 slices shown (1 of 2)]
[im 1/25]
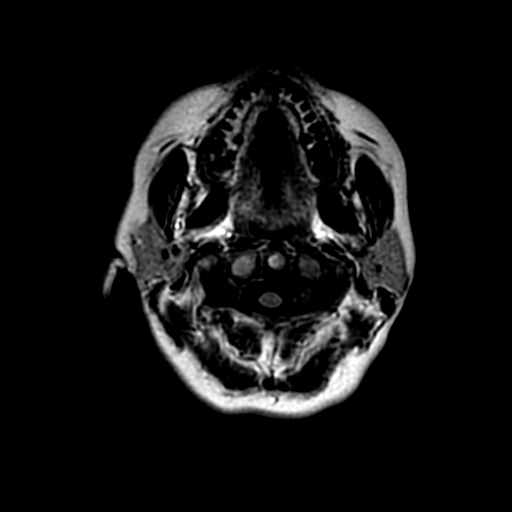
[im 25/25]
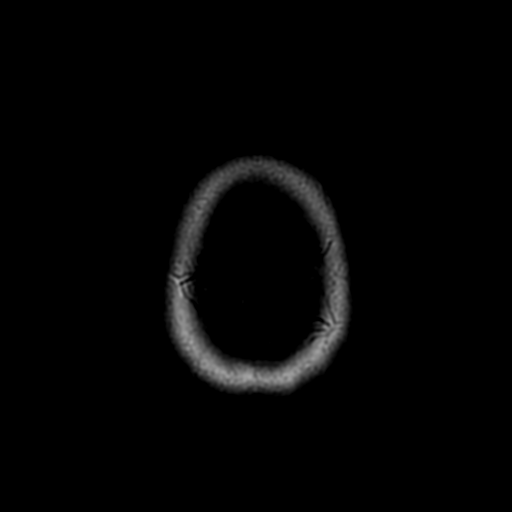

[Series 6: FLAIR · coronal · 5.0mm · 0.47mm/px · 2 of 23 slices shown (2 of 2)]
[im 1/23]
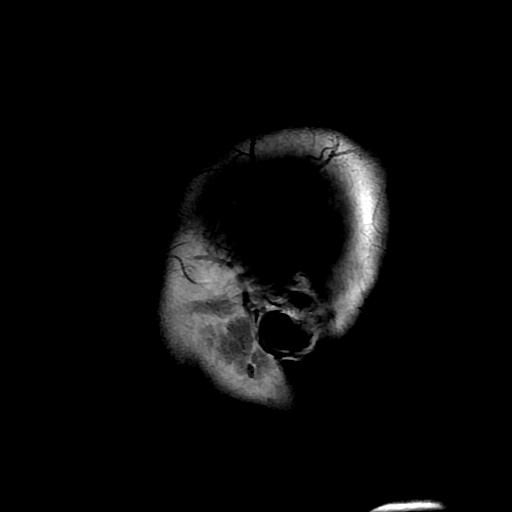
[im 23/23]
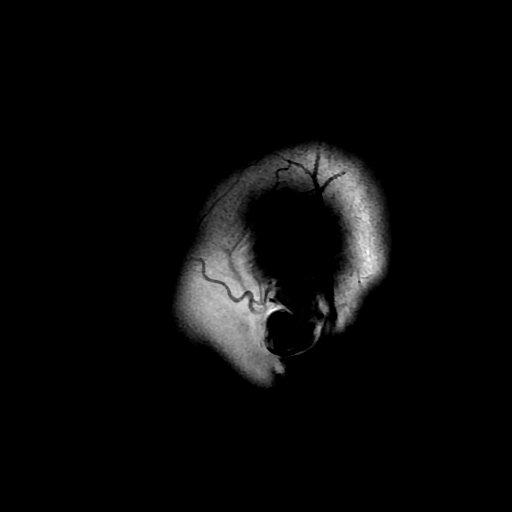

[Series 7: SWI · axial · 3.0mm · 0.47mm/px · z∈[-62,+60]mm · 6 of 100 slices shown]
[im 1/100]
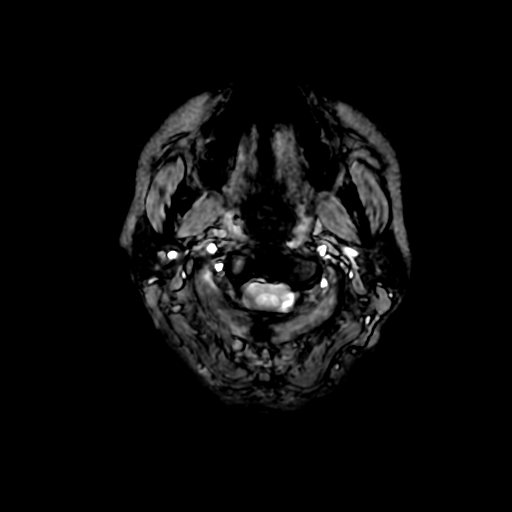
[im 17/100]
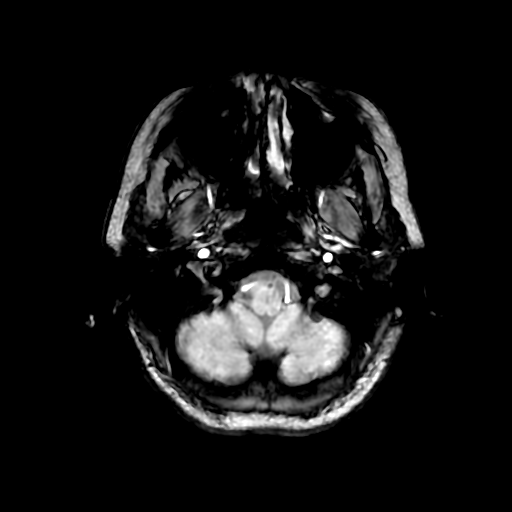
[im 34/100]
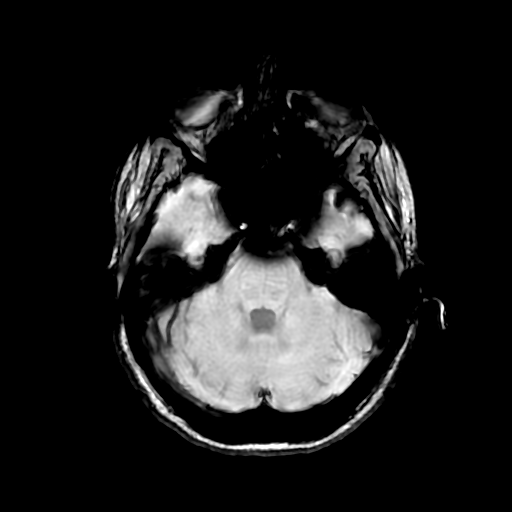
[im 50/100]
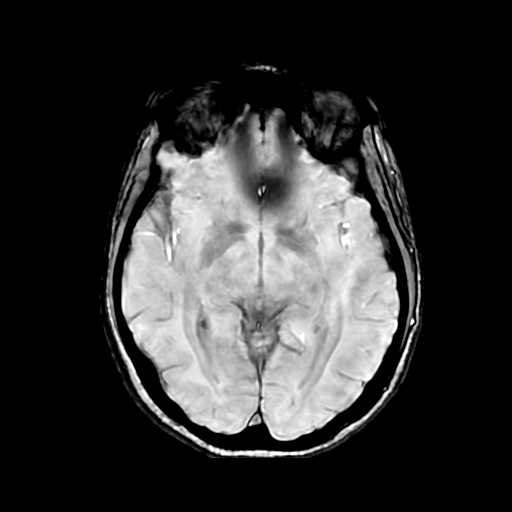
[im 67/100]
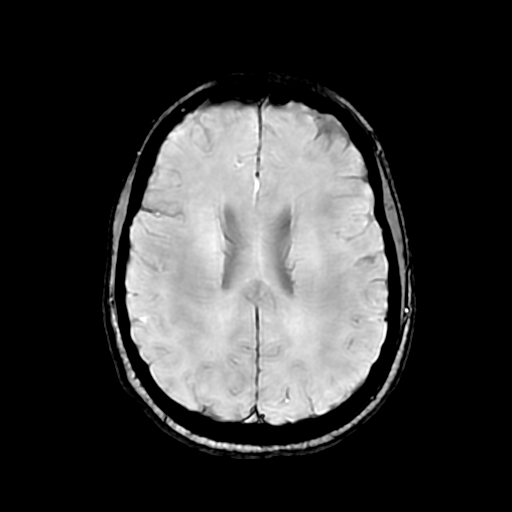
[im 83/100]
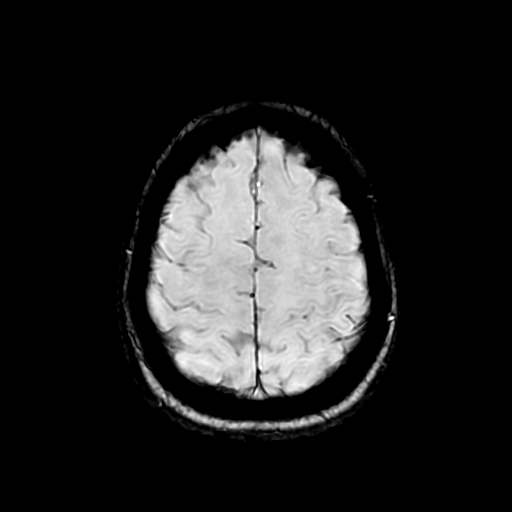

[Series 8: DWI · axial · 3.0mm · 0.94mm/px · z∈[-62,+84]mm · 7 of 100 slices shown (2 of 3)]
[im 1/100]
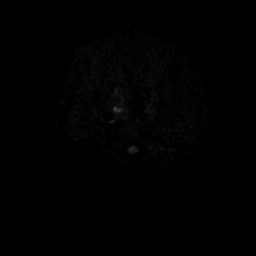
[im 17/100]
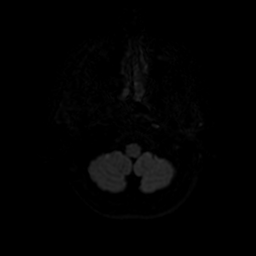
[im 34/100]
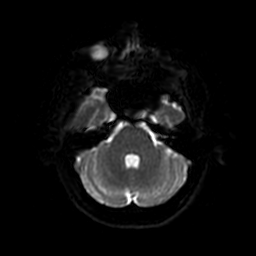
[im 50/100]
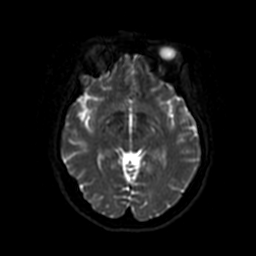
[im 67/100]
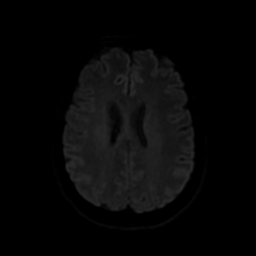
[im 83/100]
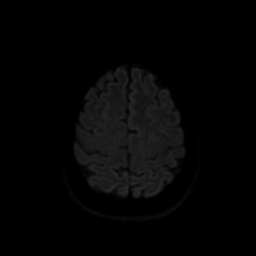
[im 100/100]
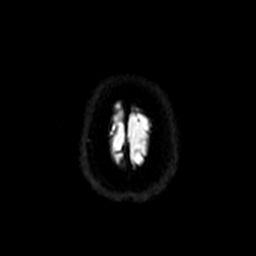

[Series 9: DWI · axial · 4.0mm · 0.94mm/px · z∈[-54,+81]mm · 4 of 56 slices shown (3 of 3)]
[im 1/56]
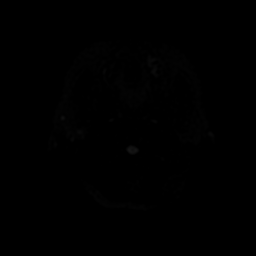
[im 19/56]
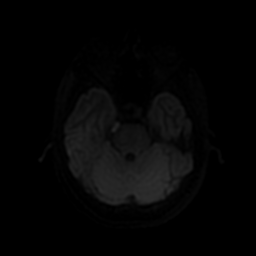
[im 37/56]
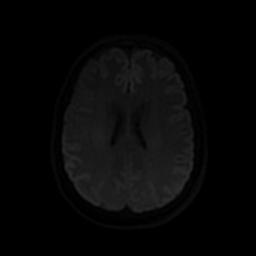
[im 56/56]
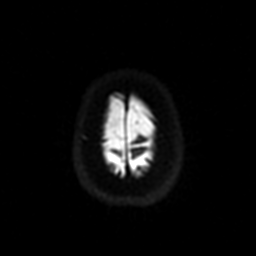

[Series 11: T2 · sagittal · 5.0mm · 0.47mm/px · 2 of 29 slices shown (2 of 2)]
[im 1/29]
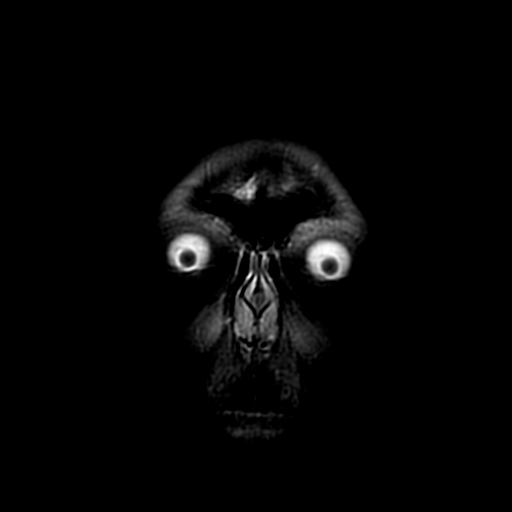
[im 29/29]
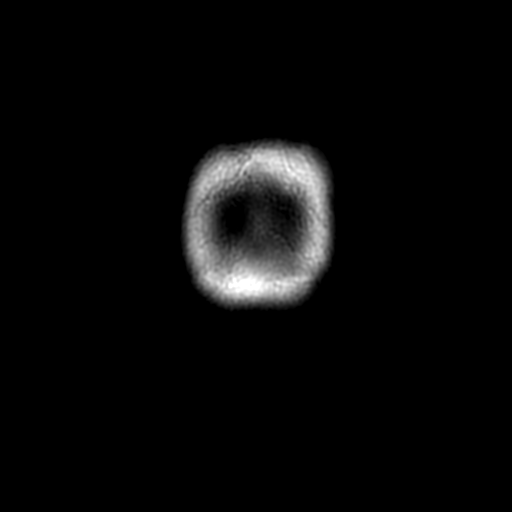

[Series 350: ADC · sagittal · 4.0mm · 0.94mm/px · 2 of 35 slices shown (1 of 3)]
[im 1/35]
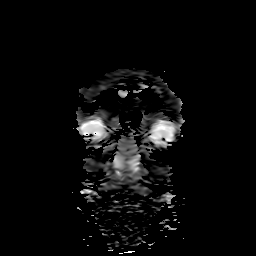
[im 35/35]
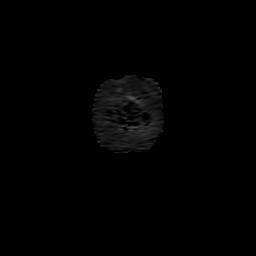

[Series 850: ADC · axial · 3.0mm · 0.94mm/px · z∈[-62,+84]mm · 3 of 50 slices shown (2 of 3)]
[im 1/50]
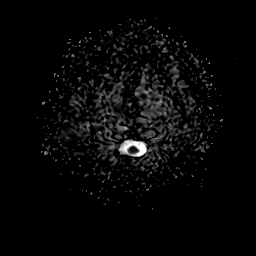
[im 25/50]
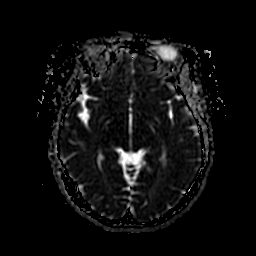
[im 50/50]
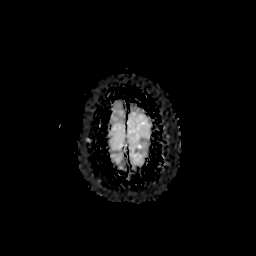

[Series 950: ADC · axial · 4.0mm · 0.94mm/px · z∈[-54,+81]mm · 2 of 27 slices shown (3 of 3)]
[im 1/27]
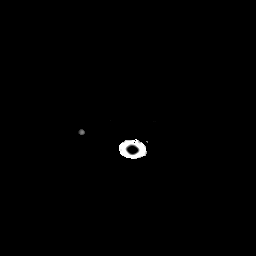
[im 27/27]
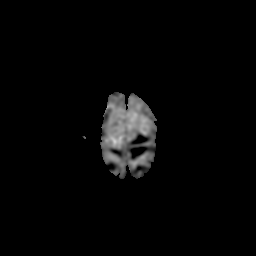

[35 of 48 positions shown; findings below may reference images not displayed]

FINDINGS: Brain: The brain has a normal appearance without evidence of
malformation, atrophy, old or acute small or large vessel
infarction, mass lesion, hemorrhage, hydrocephalus or extra-axial
collection.

Vascular: Major vessels at the base of the brain show flow. Venous
sinuses appear patent.

Skull and upper cervical spine: Normal.

Sinuses/Orbits: Sinuses are clear except for an insignificant
retention cyst of the sphenoid sinus.

Other: None significant.
IMPRESSION: Normal MRI brain examination.

## 2020-04-18 NOTE — Progress Notes (Signed)
Patient Care Team: [name], Joann.Marlou Sa, MD as PCP - General (Family Medicine) Rockwell Germany, RN as Oncology Nurse Navigator [name], RN as Oncology Nurse Navigator  DIAGNOSIS:    ICD-10-CM   1. Ductal carcinoma in situ (DCIS) of right breast  D05.11     SUMMARY OF ONCOLOGIC HISTORY: Oncology History  Ductal carcinoma in situ (DCIS) of right breast  01/25/2019 Initial Diagnosis   Screening mammogram showed bilateral calcifications right breast 5 mm, pleomorphic.  Left breast 1.6 cm punctate, smaller group of calcifications spanning 3 mm.  Right lower outer quadrant biopsy revealed low-grade DCIS ER 90%, PR 70%; biopsy of left breast calcifications ALH and PASH   01/31/2019 Genetic Testing   ATM c.8495G>A and RECQL4 c.2587G>A VUS identified on the 9-gene STAT panel and Multi-cancer panel through [facility].  The STAT Breast cancer panel offered by [facility] includes sequencing and rearrangement analysis for the following 9 genes:  ATM, BRCA1, BRCA2, CDH1, CHEK2, PALB2, PTEN, STK11 and TP53.   The Multi-Gene Panel offered by [facility] includes sequencing and/or deletion duplication testing of the following 84 genes: AIP, ALK, APC, ATM, AXIN2,BAP1,  BARD1, BLM, BMPR1A, BRCA1, BRCA2, BRIP1, CASR, CDC73, CDH1, CDK4, CDKN1B, CDKN1C, CDKN2A (p14ARF), CDKN2A (p16INK4a), CEBPA, CHEK2, CTNNA1, DICER1, DIS3L2, EGFR (c.2369C>T, p.Thr790Met variant only), EPCAM (Deletion/duplication testing only), FH, FLCN, GATA2, GPC3, GREM1 (Promoter region deletion/duplication testing only), HOXB13 (c.251G>A, p.Gly84Glu), HRAS, KIT, MAX, MEN1, MET, MITF (c.952G>A, p.Glu318Lys variant only), MLH1, MSH2, MSH3, MSH6, MUTYH, NBN, NF1, NF2, NTHL1, PALB2, PDGFRA, PHOX2B, PMS2, POLD1, POLE, POT1, PRKAR1A, PTCH1, PTEN, RAD50, RAD51C, RAD51D, RB1, RECQL4, RET, RUNX1, SDHAF2, SDHA (sequence changes only), SDHB, SDHC, SDHD, SMAD4, SMARCA4, SMARCB1, SMARCE1, STK11, SUFU, TERC, TERT, TMEM127, TP53, TSC1, TSC2, VHL, WRN and WT1.  he report  date is February 02, 2019.   02/11/2019 Surgery   RT lumpectomy: DICS Intermediate grade, Margins Neg, ER 90%, PR 70%; Lt Lumpectomy: UDH   02/26/2019 -  Anti-estrogen oral therapy   Tamoxifen 70m daily, plan for 5 years   05/20/2019 - 06/17/2019 Radiation Therapy   Adjuvant XRT   AML (acute myeloid leukemia) ([facility] [place]  09/1998 Initial Diagnosis   AML (acute myeloid leukemia) ([facility] treated with 2 induction regimens followed by 6 consolidation regimens, remission     CHIEF COMPLIANT: Follow-up of right breast DCIS  INTERVAL HISTORY: Joann Meadows a 44y.o. with above-mentioned history of right breast DCIS for which she underwent a lumpectomy, radiation, and is currently on surveillance as she could not tolerate antiestrogen therapy with tamoxifen. Mammogram on 01/07/20 showed no evidence of malignancy bilaterally.She presents to the clinic today for follow-up. Patient complains of a discomfort in the right axillary area.  She has weird sensation beneath the right breast.  She also has bloating and discomfort in the right lower abdomen area.  ALLERGIES:  has No Known Allergies.  MEDICATIONS:  Current Outpatient Medications  Medication Sig Dispense Refill  . acetaminophen (TYLENOL) 325 MG tablet Take 650 mg by mouth every 6 (six) hours as needed for mild pain.     . famotidine (PEPCID) 20 MG tablet One after supper (Patient not taking: Reported on 12/31/2019) 30 tablet 11  . pantoprazole (PROTONIX) 40 MG tablet TAKE 1 TABLET (40 MG TOTAL) BY MOUTH DAILY. TAKE 30-60 MIN BEFORE FIRST MEAL OF THE DAY 90 tablet 1   No current facility-administered medications for this visit.    PHYSICAL EXAMINATION: ECOG PERFORMANCE STATUS: 1 - Symptomatic but completely ambulatory  Vitals:   04/19/20 0815  BP: 118/82  Pulse: 70  Resp: 18  Temp: 98.9 F (37.2 C)  SpO2: 100%   Filed Weights   04/19/20 0815  Weight: 158 lb 9.6 oz (71.9 kg)    BREAST: No palpable masses or nodules in either right or  left breasts. No palpable axillary supraclavicular or infraclavicular adenopathy no breast tenderness or nipple discharge. (exam performed in the presence of a chaperone)  LABORATORY DATA:  I have reviewed the data as listed CMP Latest Ref Rng & Units 10/27/2019 06/16/2019 06/15/2019  Glucose 70 - 99 mg/dL 96 117(H) 111(H)  BUN 6 - 20 mg/dL _0 Creatinine 0.44 - 1.00 mg/dL 0.84 0.82 0.70  Sodium 135 - 145 mmol/Joann 141 142 143  Potassium 3.5 - 5.1 mmol/Joann 3.9 4.4 3.9  Chloride 98 - 111 mmol/Joann 107 109 109  CO2 22 - 32 mmol/Joann 25 24 -  Calcium 8.9 - 10.3 mg/dL 9.1 9.1 -  Total Protein 6.5 - 8.1 g/dL 7.2 6.9 -  Total Bilirubin 0.3 - 1.2 mg/dL 0.3 0.4 -  Alkaline Phos 38 - 126 U/Joann 59 45 -  AST 15 - 41 U/Joann 13(Joann) 11(Joann) -  ALT 0 - 44 U/Joann 15 12 -    Lab Results  Component Value Date   WBC 6.7 10/27/2019   HGB 13.1 10/27/2019   HCT 40.2 10/27/2019   MCV 91.8 10/27/2019   PLT 220 10/27/2019   NEUTROABS 4.2 10/27/2019    ASSESSMENT & PLAN:  Ductal carcinoma in situ (DCIS) of right breast 02/11/19:RT lumpectomy: DICS Intermediate grade, Margins Neg, ER 90%, PR 70%; Lt Lumpectomy: UDH Stage TisNx Stage 0  Plan: 1. Adj RTstarted 05/24/2019 2. Could not tolerate Tamoxifen (depression and facial numbness)  Emergency room visit for numbness of the face and difficulty with articulation of speech. CT head and brain MRI were negative.  Breast Cancer Surveillance:  1. Breast Exam: 04/19/20: Benign 2. Mammogram: 01/07/20: Benign density Cat C CT chest 10/27/19: 4 mm nodule, no need of follow up CTs (non smoker) Right axillary chest wall pain: Related to prior surgery Right lower abdominal discomfort: Unclear etiology.  We will obtain a CT of her abdomen.  Severe anxiety and fear of cancer: I try to reassure her.  I discussed with her since DCIS does not metastasize, I am not concerned about breast cancer.  However since her symptom is been ongoing we will obtain a CT of her abdomen. I  will call her with the results of the scan.  RTC in 1 year   No orders of the defined types were placed in this encounter.  The patient has a good understanding of the overall plan. she agrees with it. she will call with any problems that may develop before the next visit here.  Total time spent: 30 mins including face to face time and time spent for planning, charting and coordination of care  Nicholas Lose, MD 04/19/2020  I, Cloyde Reams Dorshimer, am acting as scribe for Dr. Nicholas Lose.  I have reviewed the above documentation for accuracy and completeness, and I agree with the above.

## 2020-04-19 ENCOUNTER — Inpatient Hospital Stay: Payer: BC Managed Care – PPO | Attending: Hematology and Oncology | Admitting: Hematology and Oncology

## 2020-04-19 ENCOUNTER — Other Ambulatory Visit: Payer: Self-pay

## 2020-04-19 DIAGNOSIS — Z79899 Other long term (current) drug therapy: Secondary | ICD-10-CM | POA: Insufficient documentation

## 2020-04-19 DIAGNOSIS — Z856 Personal history of leukemia: Secondary | ICD-10-CM | POA: Insufficient documentation

## 2020-04-19 DIAGNOSIS — D0511 Intraductal carcinoma in situ of right breast: Secondary | ICD-10-CM | POA: Insufficient documentation

## 2020-04-19 DIAGNOSIS — R0789 Other chest pain: Secondary | ICD-10-CM | POA: Diagnosis not present

## 2020-04-19 DIAGNOSIS — F419 Anxiety disorder, unspecified: Secondary | ICD-10-CM | POA: Insufficient documentation

## 2020-04-19 DIAGNOSIS — Z170 Estrogen receptor positive status [ER+]: Secondary | ICD-10-CM | POA: Diagnosis not present

## 2020-04-19 NOTE — Assessment & Plan Note (Signed)
02/11/19:RT lumpectomy: DICS Intermediate grade, Margins Neg, ER 90%, PR 70%; Lt Lumpectomy: UDH Stage TisNx Stage 0  Plan: 1. Adj RTstarted 05/24/2019 2. Could not tolerate Tamoxifen (depression and facial numbness)  Emergency room visit for numbness of the face and difficulty with articulation of speech. CT head and brain MRI were negative.  Breast Cancer Surveillance:  1. Breast Exam: 04/19/20: Benign 2. Mammogram: 01/07/20: Benign density Cat C CT chest 10/27/19: 4 mm nodule, no need of follow up CTs (non smoker)  RTC in 1 year

## 2020-04-21 ENCOUNTER — Telehealth: Payer: Self-pay | Admitting: Hematology and Oncology

## 2020-04-21 NOTE — Telephone Encounter (Signed)
Scheduled per los, patient has been called and notified. 

## 2020-04-26 ENCOUNTER — Other Ambulatory Visit: Payer: Self-pay

## 2020-04-26 ENCOUNTER — Other Ambulatory Visit: Payer: Self-pay | Admitting: Hematology and Oncology

## 2020-04-26 ENCOUNTER — Telehealth: Payer: Self-pay | Admitting: Hematology and Oncology

## 2020-04-26 ENCOUNTER — Ambulatory Visit (HOSPITAL_COMMUNITY)
Admission: RE | Admit: 2020-04-26 | Discharge: 2020-04-26 | Disposition: A | Discharge status: Home / Self Care | Payer: BC Managed Care – PPO | Source: Ambulatory Visit | Attending: Hematology and Oncology | Admitting: Hematology and Oncology

## 2020-04-26 DIAGNOSIS — K769 Liver disease, unspecified: Secondary | ICD-10-CM

## 2020-04-26 DIAGNOSIS — R1031 Right lower quadrant pain: Secondary | ICD-10-CM | POA: Diagnosis not present

## 2020-04-26 DIAGNOSIS — D0511 Intraductal carcinoma in situ of right breast: Secondary | ICD-10-CM

## 2020-04-26 MED ORDER — IOHEXOL 300 MG/ML  SOLN
100.0000 mL | Freq: Once | INTRAMUSCULAR | Status: AC | PRN
  Administered 2020-04-26: 100 mL via INTRAVENOUS

## 2020-04-26 MED ORDER — SODIUM CHLORIDE (PF) 0.9 % IJ SOLN
INTRAMUSCULAR | Status: AC
  Filled 2020-04-26: qty 50

## 2020-04-26 NOTE — Telephone Encounter (Signed)
I informed the patient that CT abdomen did not show any evidence of metastatic disease.  He did show liver lesions that are suspicious for hemangiomas.  Based on the radiologist recommendation we will obtain MRI of the abdomen.

## 2020-06-05 ENCOUNTER — Other Ambulatory Visit: Payer: BC Managed Care – PPO

## 2020-07-05 ENCOUNTER — Ambulatory Visit
Admission: RE | Admit: 2020-07-05 | Discharge: 2020-07-05 | Disposition: A | Discharge status: Home / Self Care | Payer: BC Managed Care – PPO | Source: Ambulatory Visit | Attending: Hematology and Oncology | Admitting: Hematology and Oncology

## 2020-07-05 ENCOUNTER — Other Ambulatory Visit: Payer: Self-pay

## 2020-07-05 DIAGNOSIS — I700 Atherosclerosis of aorta: Secondary | ICD-10-CM | POA: Diagnosis not present

## 2020-07-05 DIAGNOSIS — K769 Liver disease, unspecified: Secondary | ICD-10-CM | POA: Diagnosis not present

## 2020-07-05 DIAGNOSIS — D1803 Hemangioma of intra-abdominal structures: Secondary | ICD-10-CM | POA: Diagnosis not present

## 2020-07-05 DIAGNOSIS — N281 Cyst of kidney, acquired: Secondary | ICD-10-CM | POA: Diagnosis not present

## 2020-07-05 MED ORDER — GADOBENATE DIMEGLUMINE 529 MG/ML IV SOLN
20.0000 mL | Freq: Once | INTRAVENOUS | Status: AC | PRN
  Administered 2020-07-05: 20 mL via INTRAVENOUS

## 2020-07-06 ENCOUNTER — Telehealth: Payer: Self-pay | Admitting: *Deleted

## 2020-07-06 NOTE — Telephone Encounter (Signed)
Per MD request, RN attempt x1 to contact pt regarding benign abdominal MRI results.  No answer, LVM to return call to the office.

## 2020-08-01 DIAGNOSIS — R5383 Other fatigue: Secondary | ICD-10-CM | POA: Diagnosis not present

## 2020-08-01 DIAGNOSIS — I700 Atherosclerosis of aorta: Secondary | ICD-10-CM | POA: Diagnosis not present

## 2020-08-01 DIAGNOSIS — R252 Cramp and spasm: Secondary | ICD-10-CM | POA: Diagnosis not present

## 2020-08-01 DIAGNOSIS — E7800 Pure hypercholesterolemia, unspecified: Secondary | ICD-10-CM | POA: Diagnosis not present

## 2020-08-16 ENCOUNTER — Other Ambulatory Visit: Payer: Self-pay | Admitting: *Deleted

## 2020-08-16 ENCOUNTER — Telehealth: Payer: Self-pay | Admitting: *Deleted

## 2020-08-16 DIAGNOSIS — D0511 Intraductal carcinoma in situ of right breast: Secondary | ICD-10-CM

## 2020-08-16 NOTE — Telephone Encounter (Signed)
Pt called with c/o discomfort, pain and hardening of tissue around breast and in axilla area. Per Dr.Gudena, orders for right MM and [place] were placed. Called pt to inform orders were put in and to call [facility] to schedule appts at her convenience Pt verbalized understanding

## 2020-08-30 ENCOUNTER — Other Ambulatory Visit: Payer: Self-pay

## 2020-08-30 ENCOUNTER — Ambulatory Visit
Admission: RE | Admit: 2020-08-30 | Discharge: 2020-08-30 | Disposition: A | Discharge status: Home / Self Care | Payer: BC Managed Care – PPO | Source: Ambulatory Visit | Attending: Hematology and Oncology | Admitting: Hematology and Oncology

## 2020-08-30 ENCOUNTER — Other Ambulatory Visit: Payer: Self-pay | Admitting: Hematology and Oncology

## 2020-08-30 DIAGNOSIS — N644 Mastodynia: Secondary | ICD-10-CM | POA: Diagnosis not present

## 2020-08-30 DIAGNOSIS — D0511 Intraductal carcinoma in situ of right breast: Secondary | ICD-10-CM

## 2020-08-30 DIAGNOSIS — R922 Inconclusive mammogram: Secondary | ICD-10-CM | POA: Diagnosis not present

## 2020-09-04 DIAGNOSIS — H0102A Squamous blepharitis right eye, upper and lower eyelids: Secondary | ICD-10-CM | POA: Diagnosis not present

## 2020-09-04 DIAGNOSIS — H0102B Squamous blepharitis left eye, upper and lower eyelids: Secondary | ICD-10-CM | POA: Diagnosis not present

## 2020-09-04 DIAGNOSIS — H1045 Other chronic allergic conjunctivitis: Secondary | ICD-10-CM | POA: Diagnosis not present

## 2020-09-04 DIAGNOSIS — H43392 Other vitreous opacities, left eye: Secondary | ICD-10-CM | POA: Diagnosis not present

## 2020-12-01 DIAGNOSIS — Z20822 Contact with and (suspected) exposure to covid-19: Secondary | ICD-10-CM | POA: Diagnosis not present

## 2020-12-08 DIAGNOSIS — Z20822 Contact with and (suspected) exposure to covid-19: Secondary | ICD-10-CM | POA: Diagnosis not present

## 2020-12-13 ENCOUNTER — Other Ambulatory Visit: Payer: Self-pay | Admitting: Hematology and Oncology

## 2020-12-13 DIAGNOSIS — Z853 Personal history of malignant neoplasm of breast: Secondary | ICD-10-CM

## 2020-12-16 DIAGNOSIS — Z20822 Contact with and (suspected) exposure to covid-19: Secondary | ICD-10-CM | POA: Diagnosis not present

## 2020-12-18 DIAGNOSIS — H43392 Other vitreous opacities, left eye: Secondary | ICD-10-CM | POA: Diagnosis not present

## 2020-12-18 DIAGNOSIS — H0102A Squamous blepharitis right eye, upper and lower eyelids: Secondary | ICD-10-CM | POA: Diagnosis not present

## 2020-12-18 DIAGNOSIS — H0102B Squamous blepharitis left eye, upper and lower eyelids: Secondary | ICD-10-CM | POA: Diagnosis not present

## 2020-12-18 DIAGNOSIS — H1045 Other chronic allergic conjunctivitis: Secondary | ICD-10-CM | POA: Diagnosis not present

## 2020-12-20 ENCOUNTER — Telehealth: Payer: Self-pay

## 2020-12-20 NOTE — Telephone Encounter (Signed)
Patient called to report itching to right breast.   Patient with history of DCIS to right breast.    Patient denies any new lotions or detergents. Patient denies any new lumps, nodules, redness, streaking, or drainage to right breast.  Pt states there is a small amount of discomfort when scratching at breast.       RN recommendation to hydrate skin using a lotion such as Eucerin to see if this is related to dry skin, and increase fluid intake.  RN educated patient to notify clinic if improvement is not noticed in the next 24-48 hours; if no improvement MD recommendations to see NP for evaluation. Pt verbalized understanding and agreement.  No further needs at this time.

## 2020-12-21 ENCOUNTER — Telehealth: Payer: Self-pay | Admitting: *Deleted

## 2020-12-21 NOTE — Assessment & Plan Note (Addendum)
02/11/19:RT lumpectomy: DICS Intermediate grade, Margins Neg, ER 90%, PR 70%; Lt Lumpectomy: UDH Stage TisNx Stage 0  Plan: 1. Adj RTstarted 05/24/2019 2. Could not tolerate Tamoxifen (depression and facial numbness)  Rash on breast is resolving.  I sent in a triamcinolone ointment for her to use PRN itching.  She and I discussed applying vitamin E oil to her breast daily.  She is due for her next mammogram in 01/2020.

## 2020-12-21 NOTE — Progress Notes (Signed)
[facility] up:    Joann Meadows, L.Marlou Sa, [place] [address] [place] [address]   DIAGNOSIS: Cancer Staging No matching staging information was found for the patient.  SUMMARY OF ONCOLOGIC HISTORY: Oncology History  Ductal carcinoma in situ (DCIS) of right breast  01/25/2019 Initial Diagnosis   Screening mammogram showed bilateral calcifications right breast 5 mm, pleomorphic.  Left breast 1.6 cm punctate, smaller group of calcifications spanning 3 mm.  Right lower outer quadrant biopsy revealed low-grade DCIS ER 90%, PR 70%; biopsy of left breast calcifications ALH and PASH   01/31/2019 Genetic Testing   ATM c.8495G>A and RECQL4 c.2587G>A VUS identified on the 9-gene STAT panel and Multi-cancer panel through [facility].  The STAT Breast cancer panel offered by [facility] includes sequencing and rearrangement analysis for the following 9 genes:  ATM, BRCA1, BRCA2, CDH1, CHEK2, PALB2, PTEN, STK11 and TP53.   The Multi-Gene Panel offered by [facility] includes sequencing and/or deletion duplication testing of the following 84 genes: AIP, ALK, APC, ATM, AXIN2,BAP1,  BARD1, BLM, BMPR1A, BRCA1, BRCA2, BRIP1, CASR, CDC73, CDH1, CDK4, CDKN1B, CDKN1C, CDKN2A (p14ARF), CDKN2A (p16INK4a), CEBPA, CHEK2, CTNNA1, DICER1, DIS3L2, EGFR (c.2369C>T, p.Thr790Met variant only), EPCAM (Deletion/duplication testing only), FH, FLCN, GATA2, GPC3, GREM1 (Promoter region deletion/duplication testing only), HOXB13 (c.251G>A, p.Gly84Glu), HRAS, KIT, MAX, MEN1, MET, MITF (c.952G>A, p.Glu318Lys variant only), MLH1, MSH2, MSH3, MSH6, MUTYH, NBN, NF1, NF2, NTHL1, PALB2, PDGFRA, PHOX2B, PMS2, POLD1, POLE, POT1, PRKAR1A, PTCH1, PTEN, RAD50, RAD51C, RAD51D, RB1, RECQL4, RET, RUNX1, SDHAF2, SDHA (sequence changes only), SDHB, SDHC, SDHD, SMAD4, SMARCA4, SMARCB1, SMARCE1, STK11, SUFU, TERC, TERT, TMEM127, TP53, TSC1, TSC2, VHL, WRN and WT1.  he report date is February 02, 2019.   02/11/2019 Surgery   RT  lumpectomy: DICS Intermediate grade, Margins Neg, ER 90%, PR 70%; Lt Lumpectomy: UDH   02/26/2019 -  Anti-estrogen oral therapy   Tamoxifen $RemoveBe'20mg'[id number]$  daily, plan for 5 years   05/20/2019 - 06/17/2019 Radiation Therapy   Adjuvant XRT   AML (acute myeloid leukemia) ([facility])  09/1998 Initial Diagnosis   AML (acute myeloid leukemia) ([facility]) treated with 2 induction regimens followed by 6 consolidation regimens, remission     CURRENT THERAPY:  Observation  INTERVAL HISTORY: Joann Meadows 45 y.o. female returns for evaluation of a rash on her right breast.  She notes this started a few weeks ago, and is itching.  She denies any pain, swelling, tenderness, warmth, fever or chills.  She notes it is improving.  Patient Active Problem List   Diagnosis Date Noted  . COPD possible, would be Gold Group B 11/25/2019  . DOE (dyspnea on exertion) 11/24/2019  . Genetic testing 02/02/2019  . Ductal carcinoma in situ (DCIS) of right breast 01/25/2019  . AML (acute myeloid leukemia) ([facility]) 01/25/2019  . Family history of colon cancer   . Family history of leukemia   . Delivery normal 03/25/2017    is allergic to tamoxifen citrate.  MEDICAL HISTORY: Past Medical History:  Diagnosis Date  . Blood transfusion without reported diagnosis    leaukemia 18 years ago  . Breast cancer ([facility]) 2020   Right Breast Cancer  . Endometriosis   . Family history of colon cancer   . Family history of leukemia   . Fibroid   . Hx of radiation therapy 06/2019  . Leukemia ([facility])    20 years ago  . Personal history of radiation therapy 2020   Right Breast Cancer  . PONV (postoperative nausea and vomiting)  pt states she vomits after colonoscopies    SURGICAL HISTORY: Past Surgical History:  Procedure Laterality Date  . BREAST EXCISIONAL BIOPSY Left 02/11/2019   ALH  . BREAST LUMPECTOMY Right 02/11/2019  . BREAST LUMPECTOMY WITH RADIOACTIVE SEED LOCALIZATION Bilateral 02/11/2019   Procedure: BILATERAL BREAST  LUMPECTOMIES WITH BILATERAL RADIOACTIVE SEED LOCALIZATION;  Surgeon: Donnie Mesa, MD;  Location: [facility];  Service: General;  Laterality: Bilateral;  . NO PAST SURGERIES      SOCIAL HISTORY: Social History   Socioeconomic History  . Marital status: Single    Spouse name: Not on file  . Number of children: 2  . Years of education: Not on file  . Highest education level: High school graduate  Occupational History  . Not on file  Tobacco Use  . Smoking status: Former Smoker    Years: 10.00    Types: Cigarettes    Quit date: 01/25/2015    Years since quitting: 5.9  . Smokeless tobacco: Never Used  Vaping Use  . Vaping Use: Never used  Substance and Sexual Activity  . Alcohol use: No  . Drug use: No  . Sexual activity: Yes    Birth control/protection: None  Other Topics Concern  . Not on file  Social History Narrative   Lives with children   Social Determinants of Health   Financial Resource Strain: Not on file  Food Insecurity: Not on file  Transportation Needs: Not on file  Physical Activity: Not on file  Stress: Not on file  Social Connections: Not on file  Intimate Partner Violence: Not on file    FAMILY HISTORY: Family History  Problem Relation Age of Onset  . Colon cancer Mother 42       d. 4  . Hypertension Father   . Diabetes Maternal Aunt   . Diabetes Maternal Uncle   . Mental retardation Maternal Uncle   . Diabetes Paternal Aunt   . Diabetes Maternal Grandmother   . Stroke Paternal Grandmother        c. 3  . Leukemia Paternal Grandfather        d. 82; ?CLL  . Leukemia Cousin 4       mat first cousin  . Lymphoma Cousin 40       pat first cousin    Review of Systems  Constitutional: Negative for appetite change, chills, fatigue, fever and unexpected weight change.  HENT:   Negative for hearing loss, lump/mass and trouble swallowing.   Eyes: Negative for eye problems and icterus.  Respiratory: Negative for chest tightness,  cough and shortness of breath.   Cardiovascular: Negative for chest pain, leg swelling and palpitations.  Gastrointestinal: Negative for abdominal distention, abdominal pain, constipation, diarrhea, nausea and vomiting.  Endocrine: Negative for hot flashes.  Genitourinary: Negative for difficulty urinating.   Musculoskeletal: Negative for arthralgias.  Skin: Positive for rash. Negative for itching.  Neurological: Negative for dizziness, extremity weakness, headaches and numbness.  Hematological: Negative for adenopathy. Does not bruise/bleed easily.  Psychiatric/Behavioral: Negative for depression. The patient is not nervous/anxious.       PHYSICAL EXAMINATION  ECOG PERFORMANCE STATUS: 1 - Symptomatic but completely ambulatory  Vitals:   12/22/20 0941  BP: 135/90  Pulse: 78  Resp: 16  Temp: 98.1 F (36.7 C)  SpO2: 99%    Physical Exam Constitutional:      General: She is not in acute distress.    Appearance: Normal appearance. She is not toxic-appearing.  HENT:  Head: Normocephalic and atraumatic.  Eyes:     General: No scleral icterus. Cardiovascular:     Rate and Rhythm: Normal rate and regular rhythm.     Pulses: Normal pulses.     Heart sounds: Normal heart sounds.  Pulmonary:     Effort: Pulmonary effort is normal.     Breath sounds: Normal breath sounds.     Comments: Faint erythematous macular rash on right breast, appears to be resolving Abdominal:     General: Abdomen is flat. Bowel sounds are normal. There is no distension.     Palpations: Abdomen is soft.     Tenderness: There is no abdominal tenderness.  Musculoskeletal:        General: No swelling.     Cervical back: Neck supple.  Lymphadenopathy:     Cervical: No cervical adenopathy.  Skin:    General: Skin is warm and dry.     Findings: No rash.  Neurological:     General: No focal deficit present.     Mental Status: She is alert.  Psychiatric:        Mood and Affect: Mood normal.         Behavior: Behavior normal.     LABORATORY DATA:  CBC    Component Value Date/Time   WBC 6.7 10/27/2019 1528   WBC 6.0 06/15/2019 1530   RBC 4.38 10/27/2019 1528   HGB 13.1 10/27/2019 1528   HCT 40.2 10/27/2019 1528   PLT 220 10/27/2019 1528   MCV 91.8 10/27/2019 1528   MCH 29.9 10/27/2019 1528   MCHC 32.6 10/27/2019 1528   RDW 12.8 10/27/2019 1528   LYMPHSABS 1.7 10/27/2019 1528   MONOABS 0.6 10/27/2019 1528   EOSABS 0.2 10/27/2019 1528   BASOSABS 0.0 10/27/2019 1528    CMP     Component Value Date/Time   NA 141 10/27/2019 1528   K 3.9 10/27/2019 1528   CL 107 10/27/2019 1528   CO2 25 10/27/2019 1528   GLUCOSE 96 10/27/2019 1528   BUN 19 10/27/2019 1528   CREATININE 0.84 10/27/2019 1528   CALCIUM 9.1 10/27/2019 1528   PROT 7.2 10/27/2019 1528   ALBUMIN 4.2 10/27/2019 1528   AST 13 (L) 10/27/2019 1528   ALT 15 10/27/2019 1528   ALKPHOS 59 10/27/2019 1528   BILITOT 0.3 10/27/2019 1528   GFRNONAA >60 10/27/2019 1528   GFRAA >60 10/27/2019 1528          ASSESSMENT and THERAPY PLAN:   Ductal carcinoma in situ (DCIS) of right breast 02/11/19:RT lumpectomy: DICS Intermediate grade, Margins Neg, ER 90%, PR 70%; Lt Lumpectomy: UDH Stage TisNx Stage 0  Plan: 1. Adj RTstarted 05/24/2019 2. Could not tolerate Tamoxifen (depression and facial numbness)  Emergency room visit for numbness of the face and difficulty with articulation of speech. CT head and brain MRI were negative.  Breast Cancer Surveillance:  1. Breast Exam: 04/19/20: Benign 2. Mammogram: 01/07/20: Benign density Cat C CT chest 10/27/19: 4 mm nodule, no need of follow up CTs (non smoker)  RTC in 1 year   All questions were answered. The patient knows to call the clinic with any problems, questions or concerns. We can certainly see the patient much sooner if necessary.  Total encounter time: 20 minutes*  Wilber Bihari, NP 12/24/20 11:22 AM Medical Oncology and Hematology [facility] [place], [place] [address] Tel. [phone]    Fax. [phone]  *Total Encounter Time as defined by the  Centers for Medicare and Medicaid Services includes, in addition to the face-to-face time of a patient visit (documented in the note above) non-face-to-face time: obtaining and reviewing outside history, ordering and reviewing medications, tests or procedures, care coordination (communications with other health care professionals or caregivers) and documentation in the medical record.

## 2020-12-21 NOTE — Telephone Encounter (Signed)
Received call from pt with complaint of right breast rash.  Pt states she has applied lotion to the site with no resolution in symptoms.  Per MD pt needing to be seen by NP for further evaluation.  Apt scheduled and pt verbalized understanding.

## 2020-12-22 ENCOUNTER — Other Ambulatory Visit: Payer: Self-pay

## 2020-12-22 ENCOUNTER — Encounter: Payer: Self-pay | Admitting: Adult Health

## 2020-12-22 ENCOUNTER — Inpatient Hospital Stay: Payer: BC Managed Care – PPO | Attending: Adult Health | Admitting: Adult Health

## 2020-12-22 VITALS — BP 135/90 | HR 78 | Temp 98.1°F | Resp 16 | Wt 163.3 lb

## 2020-12-22 DIAGNOSIS — D0511 Intraductal carcinoma in situ of right breast: Secondary | ICD-10-CM | POA: Diagnosis not present

## 2020-12-22 DIAGNOSIS — R21 Rash and other nonspecific skin eruption: Secondary | ICD-10-CM | POA: Insufficient documentation

## 2020-12-22 DIAGNOSIS — C9200 Acute myeloblastic leukemia, not having achieved remission: Secondary | ICD-10-CM | POA: Diagnosis not present

## 2020-12-22 DIAGNOSIS — Z87891 Personal history of nicotine dependence: Secondary | ICD-10-CM | POA: Insufficient documentation

## 2020-12-22 MED ORDER — TRIAMCINOLONE ACETONIDE 0.025 % EX OINT: 1.0000 "application " | TOPICAL_OINTMENT | Freq: Two times a day (BID) | CUTANEOUS | 0 refills | Status: DC

## 2020-12-24 DIAGNOSIS — R079 Chest pain, unspecified: Secondary | ICD-10-CM | POA: Diagnosis not present

## 2020-12-26 ENCOUNTER — Telehealth: Payer: Self-pay | Admitting: Adult Health

## 2020-12-26 NOTE — Telephone Encounter (Signed)
No 12/8 los. No changes made to pt's schedule.  

## 2020-12-30 DIAGNOSIS — Z20822 Contact with and (suspected) exposure to covid-19: Secondary | ICD-10-CM | POA: Diagnosis not present

## 2021-01-03 DIAGNOSIS — R519 Headache, unspecified: Secondary | ICD-10-CM | POA: Diagnosis not present

## 2021-01-05 DIAGNOSIS — Z20822 Contact with and (suspected) exposure to covid-19: Secondary | ICD-10-CM | POA: Diagnosis not present

## 2021-01-22 DIAGNOSIS — H18832 Recurrent erosion of cornea, left eye: Secondary | ICD-10-CM | POA: Diagnosis not present

## 2021-01-23 ENCOUNTER — Other Ambulatory Visit: Payer: Self-pay

## 2021-01-23 ENCOUNTER — Ambulatory Visit
Admission: RE | Admit: 2021-01-23 | Discharge: 2021-01-23 | Disposition: A | Discharge status: Home / Self Care | Payer: BC Managed Care – PPO | Source: Ambulatory Visit | Attending: Hematology and Oncology | Admitting: Hematology and Oncology

## 2021-01-23 DIAGNOSIS — Z853 Personal history of malignant neoplasm of breast: Secondary | ICD-10-CM

## 2021-01-23 DIAGNOSIS — R922 Inconclusive mammogram: Secondary | ICD-10-CM | POA: Diagnosis not present

## 2021-01-24 DIAGNOSIS — H18832 Recurrent erosion of cornea, left eye: Secondary | ICD-10-CM | POA: Diagnosis not present

## 2021-01-31 DIAGNOSIS — Z6829 Body mass index (BMI) 29.0-29.9, adult: Secondary | ICD-10-CM | POA: Diagnosis not present

## 2021-01-31 DIAGNOSIS — N926 Irregular menstruation, unspecified: Secondary | ICD-10-CM | POA: Diagnosis not present

## 2021-01-31 DIAGNOSIS — Z01419 Encounter for gynecological examination (general) (routine) without abnormal findings: Secondary | ICD-10-CM | POA: Diagnosis not present

## 2021-02-05 DIAGNOSIS — R947 Abnormal results of other endocrine function studies: Secondary | ICD-10-CM | POA: Diagnosis not present

## 2021-03-14 DIAGNOSIS — Z20822 Contact with and (suspected) exposure to covid-19: Secondary | ICD-10-CM | POA: Diagnosis not present

## 2021-04-13 DIAGNOSIS — H6990 Unspecified Eustachian tube disorder, unspecified ear: Secondary | ICD-10-CM | POA: Diagnosis not present

## 2021-04-17 NOTE — Progress Notes (Signed)
 Patient Care Team: Mitchell, L.Dean, MD as PCP - General (Family Medicine) Martini, Keisha N, RN as Oncology Nurse Navigator Stuart, Dawn C, RN as Oncology Nurse Navigator  DIAGNOSIS:    ICD-10-CM   1. Ductal carcinoma in situ (DCIS) of right breast  D05.11     SUMMARY OF ONCOLOGIC HISTORY: Oncology History  Ductal carcinoma in situ (DCIS) of right breast  01/25/2019 Initial Diagnosis   Screening mammogram showed bilateral calcifications right breast 5 mm, pleomorphic.  Left breast 1.6 cm punctate, smaller group of calcifications spanning 3 mm.  Right lower outer quadrant biopsy revealed low-grade DCIS ER 90%, PR 70%; biopsy of left breast calcifications ALH and PASH   01/31/2019 Genetic Testing   ATM c.8495G>A and RECQL4 c.2587G>A VUS identified on the 9-gene STAT panel and Multi-cancer panel through [facility].  The STAT Breast cancer panel offered by [facility] includes sequencing and rearrangement analysis for the following 9 genes:  ATM, BRCA1, BRCA2, CDH1, CHEK2, PALB2, PTEN, STK11 and TP53.   The Multi-Gene Panel offered by [facility] includes sequencing and/or deletion duplication testing of the following 84 genes: AIP, ALK, APC, ATM, AXIN2,BAP1,  BARD1, BLM, BMPR1A, BRCA1, BRCA2, BRIP1, CASR, CDC73, CDH1, CDK4, CDKN1B, CDKN1C, CDKN2A (p14ARF), CDKN2A (p16INK4a), CEBPA, CHEK2, CTNNA1, DICER1, DIS3L2, EGFR (c.2369C>T, p.Thr790Met variant only), EPCAM (Deletion/duplication testing only), FH, FLCN, GATA2, GPC3, GREM1 (Promoter region deletion/duplication testing only), HOXB13 (c.251G>A, p.Gly84Glu), HRAS, KIT, MAX, MEN1, MET, MITF (c.952G>A, p.Glu318Lys variant only), MLH1, MSH2, MSH3, MSH6, MUTYH, NBN, NF1, NF2, NTHL1, PALB2, PDGFRA, PHOX2B, PMS2, POLD1, POLE, POT1, PRKAR1A, PTCH1, PTEN, RAD50, RAD51C, RAD51D, RB1, RECQL4, RET, RUNX1, SDHAF2, SDHA (sequence changes only), SDHB, SDHC, SDHD, SMAD4, SMARCA4, SMARCB1, SMARCE1, STK11, SUFU, TERC, TERT, TMEM127, TP53, TSC1, TSC2, VHL, WRN and WT1.  he report  date is February 02, 2019.   02/11/2019 Surgery   RT lumpectomy: DICS Intermediate grade, Margins Neg, ER 90%, PR 70%; Lt Lumpectomy: UDH   02/26/2019 -  Anti-estrogen oral therapy   Tamoxifen 20mg daily, plan for 5 years   05/20/2019 - 06/17/2019 Radiation Therapy   Adjuvant XRT   AML (acute myeloid leukemia) ([facility])  09/1998 Initial Diagnosis   AML (acute myeloid leukemia) ([facility]) treated with 2 induction regimens followed by 6 consolidation regimens, remission     CHIEF COMPLIANT: Follow-up of right breast DCIS  INTERVAL HISTORY: Joann Meadows is a 45 y.o. with above-mentioned history of right breast DCIS for which she underwentalumpectomy, radiation, and iscurrently onsurveillance as she could not tolerate antiestrogen therapy withtamoxifen. Mammogram on 01/23/21 showed no evidence of malignancy bilaterally.She presentsto the clinic today for follow-up.  ALLERGIES:  is allergic to tamoxifen citrate.  MEDICATIONS:  Current Outpatient Medications  Medication Sig Dispense Refill  . ibuprofen (ADVIL) 200 MG tablet Take 2 tablets by mouth every 12 (twelve) hours as needed.    . Polyethyl Glycol-Propyl Glycol 0.4-0.3 % SOLN Apply 2 drops to eye in the morning, at noon, in the evening, and at bedtime.    . triamcinolone (KENALOG) 0.025 % ointment Apply 1 application topically 2 (two) times daily. 30 g 0   No current facility-administered medications for this visit.    PHYSICAL EXAMINATION: ECOG PERFORMANCE STATUS: 1 - Symptomatic but completely ambulatory  Vitals:   04/18/21 1002  BP: 133/87  Pulse: 78  Resp: 18  Temp: 97.7 F (36.5 C)  SpO2: 99%   Filed Weights   04/18/21 1002  Weight: 160 lb 8 oz (72.8 kg)    BREAST: No palpable masses or nodules in either   right or left breasts. No palpable axillary supraclavicular or infraclavicular adenopathy no breast tenderness or nipple discharge. (exam performed in the presence of a chaperone)  LABORATORY DATA:  I have reviewed  the data as listed CMP Latest Ref Rng & Units 10/27/2019 06/16/2019 06/15/2019  Glucose 70 - 99 mg/dL 96 117(H) 111(H)  BUN 6 - 20 mg/dL 19 14 15  Creatinine 0.44 - 1.00 mg/dL 0.84 0.82 0.70  Sodium 135 - 145 mmol/L 141 142 143  Potassium 3.5 - 5.1 mmol/L 3.9 4.4 3.9  Chloride 98 - 111 mmol/L 107 109 109  CO2 22 - 32 mmol/L 25 24 -  Calcium 8.9 - 10.3 mg/dL 9.1 9.1 -  Total Protein 6.5 - 8.1 g/dL 7.2 6.9 -  Total Bilirubin 0.3 - 1.2 mg/dL 0.3 0.4 -  Alkaline Phos 38 - 126 U/L 59 45 -  AST 15 - 41 U/L 13(L) 11(L) -  ALT 0 - 44 U/L 15 12 -    Lab Results  Component Value Date   WBC 6.7 10/27/2019   HGB 13.1 10/27/2019   HCT 40.2 10/27/2019   MCV 91.8 10/27/2019   PLT 220 10/27/2019   NEUTROABS 4.2 10/27/2019    ASSESSMENT & PLAN:  Ductal carcinoma in situ (DCIS) of right breast 02/11/19:RT lumpectomy: DICS Intermediate grade, Margins Neg, ER 90%, PR 70%; Lt Lumpectomy: UDH Stage TisNx Stage 0  Plan: 1. Adj RTstarted 05/24/2019 2. Could not tolerate Tamoxifen (depression and facial numbness)  Breast Cancer Surveillance:  1. Breast Exam: 04/18/2021: Benign 2. Mammogram: 01/23/2021: Benign density Cat C CT chest 10/27/19: 4 mm nodule, no need of follow up CTs (non smoker)  Given her breast density and her young age and the fact that she cannot take tamoxifen, I recommended that we perform breast MRIs every year.  RTC in 1 year     No orders of the defined types were placed in this encounter.  The patient has a good understanding of the overall plan. she agrees with it. she will call with any problems that may develop before the next visit here.  Total time spent: 20 mins including face to face time and time spent for planning, charting and coordination of care  Vinay K Gudena, MD, MPH 04/18/2021  I, Molly Dorshimer, am acting as scribe for Dr. Vinay Gudena.  I have reviewed the above documentation for accuracy and completeness, and I agree with the  above.       

## 2021-04-18 ENCOUNTER — Other Ambulatory Visit: Payer: Self-pay

## 2021-04-18 ENCOUNTER — Inpatient Hospital Stay: Payer: BC Managed Care – PPO | Attending: Hematology and Oncology | Admitting: Hematology and Oncology

## 2021-04-18 DIAGNOSIS — Z923 Personal history of irradiation: Secondary | ICD-10-CM | POA: Insufficient documentation

## 2021-04-18 DIAGNOSIS — Z79810 Long term (current) use of selective estrogen receptor modulators (SERMs): Secondary | ICD-10-CM | POA: Insufficient documentation

## 2021-04-18 DIAGNOSIS — D0511 Intraductal carcinoma in situ of right breast: Secondary | ICD-10-CM | POA: Diagnosis not present

## 2021-04-18 DIAGNOSIS — C9201 Acute myeloblastic leukemia, in remission: Secondary | ICD-10-CM | POA: Insufficient documentation

## 2021-04-18 NOTE — Assessment & Plan Note (Signed)
02/11/19:RT lumpectomy: DICS Intermediate grade, Margins Neg, ER 90%, PR 70%; Lt Lumpectomy: UDH Stage TisNx Stage 0  Plan: 1. Adj RTstarted 05/24/2019 2. Could not tolerate Tamoxifen (depression and facial numbness)  Emergency room visit for numbness of the face and difficulty with articulation of speech. CT head and brain MRI were negative.  Breast Cancer Surveillance:  1. Breast Exam: 04/18/2021: Benign 2. Mammogram: 01/23/2021: Benign density Cat C CT chest 10/27/19: 4 mm nodule, no need of follow up CTs (non smoker)  RTC in 1 year

## 2021-04-19 ENCOUNTER — Telehealth: Payer: Self-pay | Admitting: Hematology and Oncology

## 2021-04-19 NOTE — Telephone Encounter (Signed)
Scheduled appointment per 05/25 los. Patient is aware.

## 2021-04-24 DIAGNOSIS — H18832 Recurrent erosion of cornea, left eye: Secondary | ICD-10-CM | POA: Diagnosis not present

## 2021-05-10 DIAGNOSIS — H04123 Dry eye syndrome of bilateral lacrimal glands: Secondary | ICD-10-CM | POA: Diagnosis not present

## 2021-05-10 DIAGNOSIS — H0102A Squamous blepharitis right eye, upper and lower eyelids: Secondary | ICD-10-CM | POA: Diagnosis not present

## 2021-05-10 DIAGNOSIS — H18832 Recurrent erosion of cornea, left eye: Secondary | ICD-10-CM | POA: Diagnosis not present

## 2021-05-10 DIAGNOSIS — H0102B Squamous blepharitis left eye, upper and lower eyelids: Secondary | ICD-10-CM | POA: Diagnosis not present

## 2021-08-01 ENCOUNTER — Other Ambulatory Visit: Payer: Self-pay

## 2021-08-01 ENCOUNTER — Ambulatory Visit
Admission: RE | Admit: 2021-08-01 | Discharge: 2021-08-01 | Disposition: A | Discharge status: Home / Self Care | Payer: BC Managed Care – PPO | Source: Ambulatory Visit | Attending: Hematology and Oncology | Admitting: Hematology and Oncology

## 2021-08-01 DIAGNOSIS — E7800 Pure hypercholesterolemia, unspecified: Secondary | ICD-10-CM | POA: Diagnosis not present

## 2021-08-01 DIAGNOSIS — K769 Liver disease, unspecified: Secondary | ICD-10-CM | POA: Diagnosis not present

## 2021-08-01 DIAGNOSIS — Z9889 Other specified postprocedural states: Secondary | ICD-10-CM | POA: Diagnosis not present

## 2021-08-01 DIAGNOSIS — R5383 Other fatigue: Secondary | ICD-10-CM | POA: Diagnosis not present

## 2021-08-01 DIAGNOSIS — M2550 Pain in unspecified joint: Secondary | ICD-10-CM | POA: Diagnosis not present

## 2021-08-01 DIAGNOSIS — Z853 Personal history of malignant neoplasm of breast: Secondary | ICD-10-CM | POA: Diagnosis not present

## 2021-08-01 DIAGNOSIS — D0511 Intraductal carcinoma in situ of right breast: Secondary | ICD-10-CM

## 2021-08-01 DIAGNOSIS — I700 Atherosclerosis of aorta: Secondary | ICD-10-CM | POA: Diagnosis not present

## 2021-08-01 MED ORDER — GADOBUTROL 1 MMOL/ML IV SOLN
7.0000 mL | Freq: Once | INTRAVENOUS | Status: AC | PRN
  Administered 2021-08-01: 7 mL via INTRAVENOUS

## 2021-08-02 ENCOUNTER — Other Ambulatory Visit: Payer: Self-pay | Admitting: Hematology and Oncology

## 2021-08-02 DIAGNOSIS — R9389 Abnormal findings on diagnostic imaging of other specified body structures: Secondary | ICD-10-CM

## 2021-08-15 ENCOUNTER — Ambulatory Visit
Admission: RE | Admit: 2021-08-15 | Discharge: 2021-08-15 | Disposition: A | Discharge status: Home / Self Care | Payer: BC Managed Care – PPO | Source: Ambulatory Visit | Attending: Hematology and Oncology | Admitting: Hematology and Oncology

## 2021-08-15 ENCOUNTER — Other Ambulatory Visit (HOSPITAL_COMMUNITY): Payer: Self-pay | Admitting: Diagnostic Radiology

## 2021-08-15 ENCOUNTER — Other Ambulatory Visit: Payer: Self-pay

## 2021-08-15 DIAGNOSIS — R9389 Abnormal findings on diagnostic imaging of other specified body structures: Secondary | ICD-10-CM

## 2021-08-15 DIAGNOSIS — D0511 Intraductal carcinoma in situ of right breast: Secondary | ICD-10-CM | POA: Diagnosis not present

## 2021-08-15 DIAGNOSIS — R928 Other abnormal and inconclusive findings on diagnostic imaging of breast: Secondary | ICD-10-CM | POA: Diagnosis not present

## 2021-08-15 MED ORDER — GADOBUTROL 1 MMOL/ML IV SOLN: 8.0000 mL | Freq: Once | INTRAVENOUS | Status: DC | PRN

## 2021-08-16 ENCOUNTER — Other Ambulatory Visit: Payer: Self-pay | Admitting: Hematology and Oncology

## 2021-08-16 DIAGNOSIS — C50911 Malignant neoplasm of unspecified site of right female breast: Secondary | ICD-10-CM

## 2021-08-21 ENCOUNTER — Other Ambulatory Visit: Payer: Self-pay

## 2021-08-21 ENCOUNTER — Ambulatory Visit
Admission: RE | Admit: 2021-08-21 | Discharge: 2021-08-21 | Disposition: A | Discharge status: Home / Self Care | Payer: BC Managed Care – PPO | Source: Ambulatory Visit | Attending: Hematology and Oncology | Admitting: Hematology and Oncology

## 2021-08-21 DIAGNOSIS — R922 Inconclusive mammogram: Secondary | ICD-10-CM | POA: Diagnosis not present

## 2021-08-21 DIAGNOSIS — C50911 Malignant neoplasm of unspecified site of right female breast: Secondary | ICD-10-CM

## 2021-08-21 NOTE — Progress Notes (Signed)
Patient Care Team: [name], L.Marlou Sa, MD as PCP - General (Family Medicine) Rockwell Germany, RN as Oncology Nurse Navigator [name], RN as Oncology Nurse Navigator  DIAGNOSIS:    ICD-10-CM   1. Ductal carcinoma in situ (DCIS) of right breast  D05.11       SUMMARY OF ONCOLOGIC HISTORY: Oncology History  Ductal carcinoma in situ (DCIS) of right breast  01/25/2019 Initial Diagnosis   Screening mammogram showed bilateral calcifications right breast 5 mm, pleomorphic.  Left breast 1.6 cm punctate, smaller group of calcifications spanning 3 mm.  Right lower outer quadrant biopsy revealed low-grade DCIS ER 90%, PR 70%; biopsy of left breast calcifications ALH and PASH   01/31/2019 Genetic Testing   ATM c.8495G>A and RECQL4 c.2587G>A VUS identified on the 9-gene STAT panel and Multi-cancer panel through [facility].  The STAT Breast cancer panel offered by [facility] includes sequencing and rearrangement analysis for the following 9 genes:  ATM, BRCA1, BRCA2, CDH1, CHEK2, PALB2, PTEN, STK11 and TP53.   The Multi-Gene Panel offered by [facility] includes sequencing and/or deletion duplication testing of the following 84 genes: AIP, ALK, APC, ATM, AXIN2,BAP1,  BARD1, BLM, BMPR1A, BRCA1, BRCA2, BRIP1, CASR, CDC73, CDH1, CDK4, CDKN1B, CDKN1C, CDKN2A (p14ARF), CDKN2A (p16INK4a), CEBPA, CHEK2, CTNNA1, DICER1, DIS3L2, EGFR (c.2369C>T, p.Thr790Met variant only), EPCAM (Deletion/duplication testing only), FH, FLCN, GATA2, GPC3, GREM1 (Promoter region deletion/duplication testing only), HOXB13 (c.251G>A, p.Gly84Glu), HRAS, KIT, MAX, MEN1, MET, MITF (c.952G>A, p.Glu318Lys variant only), MLH1, MSH2, MSH3, MSH6, MUTYH, NBN, NF1, NF2, NTHL1, PALB2, PDGFRA, PHOX2B, PMS2, POLD1, POLE, POT1, PRKAR1A, PTCH1, PTEN, RAD50, RAD51C, RAD51D, RB1, RECQL4, RET, RUNX1, SDHAF2, SDHA (sequence changes only), SDHB, SDHC, SDHD, SMAD4, SMARCA4, SMARCB1, SMARCE1, STK11, SUFU, TERC, TERT, TMEM127, TP53, TSC1, TSC2, VHL, WRN and WT1.  he  report date is February 02, 2019.   02/11/2019 Surgery   RT lumpectomy: DICS Intermediate grade, Margins Neg, ER 90%, PR 70%; Lt Lumpectomy: UDH   02/26/2019 -  Anti-estrogen oral therapy   Tamoxifen 21m daily, stopped because of depression and facial numbness   05/20/2019 - 06/17/2019 Radiation Therapy   Adjuvant XRT   08/15/2021 Relapse/Recurrence   MRI surveillance detected Right breast calcifications 5 cm, Ant biopsy: IG DCIS with necrosis, ER 40%, PR 0%, Posterior biopsy: Intermediate grade DCIS with ALH   AML (acute myeloid leukemia) ([facility]  09/1998 Initial Diagnosis   AML (acute myeloid leukemia) ([facility] treated with 2 induction regimens followed by 6 consolidation regimens, remission     CHIEF COMPLIANT: Follow-up of right breast DCIS  INTERVAL HISTORY: LCicley Ganeshis a 45y.o. with above-mentioned history of right breast DCIS for which she underwent a lumpectomy, radiation, and is currently on surveillance as she could not tolerate antiestrogen therapy with tamoxifen.  Breast MRI revealed right breast calcifications which on biopsy came back as intermediate grade DCIS.  She is accompanied by her husband to discuss the pathology report and to discuss her treatment plan.  She was very emotional today because of the recurrence.  She had multiple questions about why this is happening.  ALLERGIES:  is allergic to tamoxifen citrate.  MEDICATIONS:  Current Outpatient Medications  Medication Sig Dispense Refill   ibuprofen (ADVIL) 200 MG tablet Take 2 tablets by mouth every 12 (twelve) hours as needed.     Polyethyl Glycol-Propyl Glycol 0.4-0.3 % SOLN Apply 2 drops to eye in the morning, at noon, in the evening, and at bedtime.     triamcinolone (KENALOG) 0.025 % ointment Apply 1 application topically 2 (  two) times daily. 30 g 0   No current facility-administered medications for this visit.    PHYSICAL EXAMINATION: ECOG PERFORMANCE STATUS: 1 - Symptomatic but completely  ambulatory  Vitals:   08/22/21 1123  BP: (!) 160/92  Pulse: 87  Resp: 18  Temp: 98.1 F (36.7 C)  SpO2: 98%   Filed Weights   08/22/21 1123  Weight: 160 lb 8 oz (72.8 kg)     LABORATORY DATA:  I have reviewed the data as listed CMP Latest Ref Rng & Units 10/27/2019 06/16/2019 06/15/2019  Glucose 70 - 99 mg/dL 96 117(H) 111(H)  BUN 6 - 20 mg/dL _0 Creatinine 0.44 - 1.00 mg/dL 0.84 0.82 0.70  Sodium 135 - 145 mmol/L 141 142 143  Potassium 3.5 - 5.1 mmol/L 3.9 4.4 3.9  Chloride 98 - 111 mmol/L 107 109 109  CO2 22 - 32 mmol/L 25 24 -  Calcium 8.9 - 10.3 mg/dL 9.1 9.1 -  Total Protein 6.5 - 8.1 g/dL 7.2 6.9 -  Total Bilirubin 0.3 - 1.2 mg/dL 0.3 0.4 -  Alkaline Phos 38 - 126 U/L 59 45 -  AST 15 - 41 U/L 13(L) 11(L) -  ALT 0 - 44 U/L 15 12 -    Lab Results  Component Value Date   WBC 6.7 10/27/2019   HGB 13.1 10/27/2019   HCT 40.2 10/27/2019   MCV 91.8 10/27/2019   PLT 220 10/27/2019   NEUTROABS 4.2 10/27/2019    ASSESSMENT & PLAN:  Ductal carcinoma in situ (DCIS) of right breast 02/11/19: RT lumpectomy: DICS Intermediate grade, Margins Neg, ER 90%, PR 70%; Lt Lumpectomy: UDH Stage TisNx Stage 0    Plan: 1. Adj RT started 05/24/2019 2. Could not tolerate Tamoxifen (depression and facial numbness)  Recurrence: 08/15/2021:MRI surveillance detected Right breast calcifications 5 cm, Ant biopsy: IG DCIS with necrosis, ER 40%, PR 0%, Posterior biopsy: Intermediate grade DCIS with [facility]   Treatment plan:  1.  Mastectomy (patient is contemplating on doing bilateral mastectomies with reconstruction) If she has unilateral mastectomy then she would benefit from tamoxifen.  If she does have bilateral mastectomies then she does not need any further antiestrogen therapy.   Patient is extremely emotional and I tried to reassure her that with a mastectomy or mastectomies, she would minimize her risk of recurrence in the future. I discussed with her that with the DCIS there is no  risk of distant recurrence and therefore no indication to do scans. She may undergo sentinel lymph node removal during the mastectomy. She has an appointment next Monday to meet with Dr. [name] to discuss surgical options.  No orders of the defined types were placed in this encounter.  The patient has a good understanding of the overall plan. she agrees with it. she will call with any problems that may develop before the next visit here.  Total time spent: 30 mins including face to face time and time spent for planning, charting and coordination of care  Rulon Eisenmenger, MD, MPH 08/22/2021  I, Thana Ates, am acting as scribe for Dr. Nicholas Lose.  I have reviewed the above documentation for accuracy and completeness, and I agree with the above.

## 2021-08-22 ENCOUNTER — Inpatient Hospital Stay: Payer: BC Managed Care – PPO | Admitting: Hematology and Oncology

## 2021-08-22 ENCOUNTER — Inpatient Hospital Stay: Payer: BC Managed Care – PPO | Attending: Hematology and Oncology | Admitting: Hematology and Oncology

## 2021-08-22 DIAGNOSIS — D0511 Intraductal carcinoma in situ of right breast: Secondary | ICD-10-CM | POA: Diagnosis not present

## 2021-08-22 DIAGNOSIS — C9201 Acute myeloblastic leukemia, in remission: Secondary | ICD-10-CM | POA: Diagnosis not present

## 2021-08-22 NOTE — Assessment & Plan Note (Signed)
02/11/19:RT lumpectomy: DICS Intermediate grade, Margins Neg, ER 90%, PR 70%; Lt Lumpectomy: UDH Stage TisNx Stage 0  Plan: 1. Adj RTstarted 05/24/2019 2. Could not tolerate Tamoxifen (depression and facial numbness)  Recurrence: 08/15/2021:MRI surveillance detected Right breast calcifications 5 cm, Ant biopsy: IG DCIS with necrosis, ER 40%, PR 0%, Posterior biopsy: Intermediate grade DCIS with [facility]  Treatment plan:  1.  Mastectomy 2. attempt to see if she can tolerate a very low-dose antiestrogen therapy

## 2021-08-27 ENCOUNTER — Ambulatory Visit: Payer: Self-pay | Admitting: Surgery

## 2021-08-27 DIAGNOSIS — D0511 Intraductal carcinoma in situ of right breast: Secondary | ICD-10-CM | POA: Diagnosis not present

## 2021-08-27 NOTE — H&P (Signed)
Subjective    Chief Complaint: Breast Cancer       History of Present Illness: Joann Meadows is a 45 y.o. female who is seen today for recurrent DCIS.   PCP - [facility] Referred by GYN [name] for DCIS right breast/ ALH left breast   This is a healthy 45 year old [place] female with a history of acute myelogenous leukemia 21 years ago who presents after a recent routine screening mammogram.  In the lateral inferior right breast there is a 5 mm area of pleomorphic calcifications.  The upper outer left breast showed a loosely grouped 16 mm area of calcifications.  The left lateral inferior breast showed a 3 mm area of round punctate calcifications.  Initially she underwent core biopsy of the right breast calcifications.  This returned a diagnosis of DCIS with lobular neoplasia, ER/PR positive.  Subsequently she underwent biopsy of the 2 areas in the left breast.  The loosely grouped 16 mm area of calcifications revealed a diagnosis of PASH.  The lower outer quadrant area of calcifications showed atypical lobular hyperplasia.     No previous breast complaints AML treated at age 69.  No residual problems.   Menarche - 65 First pregnancy - 34 Breast feed - yes Hormones - no Family history - negative for breast cancer; mother died of colon cancer at age 32.       Path - right breast - DCIS with lobular neoplasia, ER/ PR + Left breast - lower outer quadrant 3 mm calcs - atypical lobular hyperplasia Left breast - upper outer quadrant 16 mm calcs - PASH   On 02/10/21, she underwent bilateral radioactive seed localized lumpectomies.  The right lumpectomy showed DCIS with clear margins.  The left lumpectomy was benign.  She could not tolerate tamoxifen.  She completed a course of radiation.  Recent surveillance MRI showed a 5 cm area of suspicious calcifications in the medial right breast..  Biopsy of the anterior and posterior portions of this area showed DCIS.  She presents now to discuss  surgical treatment.     Review of Systems: A complete review of systems was obtained from the patient.  I have reviewed this information and discussed as appropriate with the patient.  See HPI as well for other ROS.   Review of Systems  Constitutional: Negative.   HENT: Negative.   Eyes: Negative.   Respiratory: Negative.   Cardiovascular: Negative.   Gastrointestinal: Negative.   Genitourinary: Negative.   Musculoskeletal: Negative.   Skin: Negative.   Neurological: Positive for headaches.  Endo/Heme/Allergies: Negative.   Psychiatric/Behavioral: The patient is nervous/anxious.         Medical History: Past Medical History      Past Medical History:  Diagnosis Date   History of cancer             Patient Active Problem List  Diagnosis   Ductal carcinoma in situ of right breast      Past Surgical History       Past Surgical History:  Procedure Laterality Date   lumpectomy        02/11/2019        Allergies  No Known Allergies     No current outpatient medications on file prior to visit.    No current facility-administered medications on file prior to visit.      Family History       Family History  Problem Relation Age of Onset   Colon cancer Mother  High blood pressure (Hypertension) Father     Coronary Artery Disease (Blocked arteries around heart) Father          Social History        Tobacco Use  Smoking Status Former Smoker   Quit date: 2017   Years since quitting: 5.7  Smokeless Tobacco Never Used      Social History  Social History         Socioeconomic History   Marital status: Single  Tobacco Use   Smoking status: Former Smoker      Quit date: 2017      Years since quitting: 5.7   Smokeless tobacco: Never Used  Substance and Sexual Activity   Alcohol use: Never   Drug use: Never        Objective:         Vitals:    08/27/21 1104  BP: (!) 142/78  Pulse: 86  Temp: 36.8 C (98.2 F)  SpO2: 96%  Weight: 74.1  kg (163 lb 6.4 oz)  Height: 165.1 cm (5\' 5" )    Body mass index is 27.19 kg/m.   Physical Exam    Constitutional:  WDWN in NAD, conversant, no obvious deformities; lying in bed comfortably Eyes:  Pupils equal, round; sclera anicteric; moist conjunctiva; no lid lag HENT:  Oral mucosa moist; good dentition  Neck:  No masses palpated, trachea midline; no thyromegaly Lungs:  CTA bilaterally; normal respiratory effort Breasts:  Symmetric; well-healed incisions; no palpable masses in either breast; no axillary lymphadenopathy CV:  Regular rate and rhythm; no murmurs; extremities well-perfused with no edema Abd:  +bowel sounds, soft, non-tender, no palpable organomegaly; no palpable hernias Musc:  Unable to assess gait; no apparent clubbing or cyanosis in extremities Lymphatic:  No palpable cervical or axillary lymphadenopathy Skin:  Warm, dry; no sign of jaundice Psychiatric - alert and oriented x 4; calm mood and affect     Labs, Imaging and Diagnostic Testing: Diagnosis 1. Breast, right, needle core biopsy, anterior upper inner enhancement, cylinder clip - DUCTAL CARCINOMA IN SITU WITH NECROSIS, SEE COMMENT. 2. Breast, right, needle core biopsy, posteiror upper inner enhancement, barbell clip - DUCTAL CARCINOMA IN SITU WITH NECROSIS, SEE COMMENT. - ATYPICAL LOBULAR HYPERPLASIA. 1 of 2 FINAL for Joann Meadows, Joann Meadows ([id number]) Microscopic Comment 1. The carcinoma appears intermediate grade. Prognostic makers will be ordered. Dr. Saralyn Pilar has reviewed the case. The case was called to [facility] on 08/16/2021. Vicente Males MD Pathologist, Electronic Signature   ADDITIONAL INFORMATION: 2. PROGNOSTIC INDICATORS Results: IMMUNOHISTOCHEMICAL AND MORPHOMETRIC ANALYSIS PERFORMED MANUALLY Estrogen Receptor: 40%, POSITIVE, weak STAINING INTENSITY Progesterone Receptor: 0%, NEGATIVE COMMENT: The negative hormone receptor study(ies) in this case has an internal positive  control. REFERENCE RANGE ESTROGEN RECEPTOR NEGATIVE 0% POSITIVE =>1% REFERENCE RANGE PROGESTERONE RECEPTOR NEGATIVE 0% POSITIVE =>1% All controls stained appropriately Vicente Males MD Pathologist, Electronic Signature ( Signed 08/17/2021)   CLINICAL DATA:  45 year old female with history of leukemia 1999 and most recently history of right breast DCIS post lumpectomy March 2020 followed by radiation. Patient presents for high risk screening breast MRI.   LABS:  Not applicable.   EXAM: BILATERAL BREAST MRI WITH AND WITHOUT CONTRAST   TECHNIQUE: Multiplanar, multisequence MR images of both breasts were obtained prior to and following the intravenous administration of 7 ml of Gadavist   Three-dimensional MR images were rendered by post-processing of the original MR data on an independent workstation. The three-dimensional MR images were interpreted, and findings are reported  in the following complete MRI report for this study. Three dimensional images were evaluated at the independent interpreting workstation using the DynaCAD thin client.   COMPARISON:  Previous exams.   FINDINGS: Breast composition: b.  Scattered fibroglandular tissue.   Background parenchymal enhancement: Mild.   Right breast: Postsurgical changes are present in the lower outer right breast related to prior lumpectomy. There is linear non mass enhancement involving the upper inner quadrant of the right breast measuring 5 cm AP (subtraction images 70-85).   Left breast: No mass or abnormal enhancement.   Lymph nodes: No abnormal axillary or internal mammary lymph nodes identified.   Ancillary findings: Several nonenhancing liver lesions with increased T2 signal, previously characterized as benign on prior abdominal MRI dated 07/05/2020.   IMPRESSION: Suspicious linear non mass enhancement involving the upper inner quadrant of the right breast spanning 5 cm AP.   RECOMMENDATION: Recommend MRI  guided biopsy of both the anterior and posterior aspects of the linear non mass enhancement within the upper inner right breast.   BI-RADS CATEGORY  4: Suspicious.     Electronically Signed   By: Everlean Alstrom M.D.   On: 08/02/2021 13:04     Assessment and Plan:  Diagnoses and all orders for this visit:   Ductal carcinoma in situ of right breast -     Ambulatory referral to Plastic Surgery     I spent approximately 40 minutes with the patient and her husband discussing her surgical options.  She has had what appears to be an early recurrence of DCIS in the medial portion of her right breast.  Her previous lumpectomy was lateral.  She is obviously very upset about this diagnosis.  I spent a lot of time answering her questions.  At this point, the only good surgical recommendation is mastectomy.  The patient is leaning strongly towards bilateral mastectomies as she wants to reduce her risk of recurrent cancer as low as possible.  I did explain to her that bilateral mastectomies would not bring her risk to 0 but it would bring it quite low.  This would also keep her from needing to take tamoxifen, according to oncology's note.  She is strongly considering immediate reconstruction so we will expedite a referral to plastic surgery.   I did explain to the patient that we would not plan to do a sentinel lymph node biopsy at the time of surgery but we would inject Magtrace at the time of surgery.  If her final pathology returned a diagnosis of invasive cancer then we could bring her back to the operating room a few days later to pursue sentinel lymph node biopsy without further injection.   I tried to answer their questions to the best of my ability.  We will confer with Dr. Iran Planas after she has seen the patient in consultation.  It does appear that she would be a candidate for nipple sparing mastectomies but she did indicate that she wants to decrease the size of her breast.     Carlean Jews, MD    08/27/2021 3:42 PM

## 2021-08-28 ENCOUNTER — Encounter: Payer: Self-pay | Admitting: Plastic Surgery

## 2021-08-29 DIAGNOSIS — D0511 Intraductal carcinoma in situ of right breast: Secondary | ICD-10-CM | POA: Diagnosis not present

## 2021-08-29 DIAGNOSIS — Z923 Personal history of irradiation: Secondary | ICD-10-CM | POA: Diagnosis not present

## 2021-08-29 DIAGNOSIS — Z853 Personal history of malignant neoplasm of breast: Secondary | ICD-10-CM | POA: Diagnosis not present

## 2021-08-29 DIAGNOSIS — Z170 Estrogen receptor positive status [ER+]: Secondary | ICD-10-CM | POA: Diagnosis not present

## 2021-08-30 ENCOUNTER — Other Ambulatory Visit: Payer: Self-pay | Admitting: *Deleted

## 2021-08-30 ENCOUNTER — Encounter: Payer: Self-pay | Admitting: *Deleted

## 2021-08-30 DIAGNOSIS — D0511 Intraductal carcinoma in situ of right breast: Secondary | ICD-10-CM

## 2021-08-30 DIAGNOSIS — D0510 Intraductal carcinoma in situ of unspecified breast: Secondary | ICD-10-CM | POA: Diagnosis not present

## 2021-08-30 DIAGNOSIS — F418 Other specified anxiety disorders: Secondary | ICD-10-CM | POA: Diagnosis not present

## 2021-08-30 DIAGNOSIS — R030 Elevated blood-pressure reading, without diagnosis of hypertension: Secondary | ICD-10-CM | POA: Diagnosis not present

## 2021-08-30 NOTE — Progress Notes (Signed)
Received call from pt requesting second opinion referrals be placed for plastic surgery.  Pt requesting to see two additional surgeons. Per MD referral to be placed to Dr. Dalphine Handing with [facility] and Dr. Claudia Desanctis with [facility].  Referrals placed and HIM team notified to send referrals.

## 2021-08-30 NOTE — Progress Notes (Signed)
Received call from pt requesting additional referrals be placed to [facility].  Pt requesting Dr. Aida Raider for reconstructive surgery and Dr. Leeann Must for surgery options.  Verbal orders received from MD and RN messaged HIM team to submit referrals.

## 2021-08-30 NOTE — Progress Notes (Signed)
Per pt and MD request, RN successfully faxed second opinion surgical referral to Dr. Stasia Cavalier with [facility] [phone]) and plastic surgery Dr. Dorann Ou [phone]).

## 2021-08-31 DIAGNOSIS — H6981 Other specified disorders of Eustachian tube, right ear: Secondary | ICD-10-CM | POA: Diagnosis not present

## 2021-08-31 DIAGNOSIS — H6521 Chronic serous otitis media, right ear: Secondary | ICD-10-CM | POA: Diagnosis not present

## 2021-09-07 DIAGNOSIS — D0511 Intraductal carcinoma in situ of right breast: Secondary | ICD-10-CM | POA: Diagnosis not present

## 2021-09-07 DIAGNOSIS — N6012 Diffuse cystic mastopathy of left breast: Secondary | ICD-10-CM | POA: Diagnosis not present

## 2021-09-07 DIAGNOSIS — Z170 Estrogen receptor positive status [ER+]: Secondary | ICD-10-CM | POA: Diagnosis not present

## 2021-09-26 DIAGNOSIS — N6012 Diffuse cystic mastopathy of left breast: Secondary | ICD-10-CM | POA: Diagnosis not present

## 2021-09-26 DIAGNOSIS — Z170 Estrogen receptor positive status [ER+]: Secondary | ICD-10-CM | POA: Diagnosis not present

## 2021-09-26 DIAGNOSIS — D0511 Intraductal carcinoma in situ of right breast: Secondary | ICD-10-CM | POA: Diagnosis not present

## 2021-09-28 ENCOUNTER — Telehealth: Payer: Self-pay | Admitting: *Deleted

## 2021-09-28 NOTE — Telephone Encounter (Signed)
Received call from pt requesting f/u with MD. Pt is currently seeking second opinions for surgery options.  Pt states the last round of second opinions will be done this week and would like to meet with MD to discuss decision.  Appt scheduled and pt verbalized understating of appt date and time.

## 2021-10-01 DIAGNOSIS — Z923 Personal history of irradiation: Secondary | ICD-10-CM | POA: Diagnosis not present

## 2021-10-01 DIAGNOSIS — D0511 Intraductal carcinoma in situ of right breast: Secondary | ICD-10-CM | POA: Diagnosis not present

## 2021-10-03 ENCOUNTER — Encounter: Payer: Self-pay | Admitting: *Deleted

## 2021-10-04 DIAGNOSIS — F418 Other specified anxiety disorders: Secondary | ICD-10-CM | POA: Diagnosis not present

## 2021-10-04 DIAGNOSIS — R030 Elevated blood-pressure reading, without diagnosis of hypertension: Secondary | ICD-10-CM | POA: Diagnosis not present

## 2021-10-04 DIAGNOSIS — M7918 Myalgia, other site: Secondary | ICD-10-CM | POA: Diagnosis not present

## 2021-10-09 NOTE — Progress Notes (Signed)
Patient Care Team: [name], L.Marlou Sa, MD as PCP - General (Family Medicine) Rockwell Germany, RN as Oncology Nurse Navigator [name], RN as Oncology Nurse Navigator  DIAGNOSIS:    ICD-10-CM   1. Ductal carcinoma in situ (DCIS) of right breast  D05.11       SUMMARY OF ONCOLOGIC HISTORY: Oncology History  Ductal carcinoma in situ (DCIS) of right breast  01/25/2019 Initial Diagnosis   Screening mammogram showed bilateral calcifications right breast 5 mm, pleomorphic.  Left breast 1.6 cm punctate, smaller group of calcifications spanning 3 mm.  Right lower outer quadrant biopsy revealed low-grade DCIS ER 90%, PR 70%; biopsy of left breast calcifications ALH and PASH   01/31/2019 Genetic Testing   ATM c.8495G>A and RECQL4 c.2587G>A VUS identified on the 9-gene STAT panel and Multi-cancer panel through [facility].  The STAT Breast cancer panel offered by [facility] includes sequencing and rearrangement analysis for the following 9 genes:  ATM, BRCA1, BRCA2, CDH1, CHEK2, PALB2, PTEN, STK11 and TP53.   The Multi-Gene Panel offered by [facility] includes sequencing and/or deletion duplication testing of the following 84 genes: AIP, ALK, APC, ATM, AXIN2,BAP1,  BARD1, BLM, BMPR1A, BRCA1, BRCA2, BRIP1, CASR, CDC73, CDH1, CDK4, CDKN1B, CDKN1C, CDKN2A (p14ARF), CDKN2A (p16INK4a), CEBPA, CHEK2, CTNNA1, DICER1, DIS3L2, EGFR (c.2369C>T, p.Thr790Met variant only), EPCAM (Deletion/duplication testing only), FH, FLCN, GATA2, GPC3, GREM1 (Promoter region deletion/duplication testing only), HOXB13 (c.251G>A, p.Gly84Glu), HRAS, KIT, MAX, MEN1, MET, MITF (c.952G>A, p.Glu318Lys variant only), MLH1, MSH2, MSH3, MSH6, MUTYH, NBN, NF1, NF2, NTHL1, PALB2, PDGFRA, PHOX2B, PMS2, POLD1, POLE, POT1, PRKAR1A, PTCH1, PTEN, RAD50, RAD51C, RAD51D, RB1, RECQL4, RET, RUNX1, SDHAF2, SDHA (sequence changes only), SDHB, SDHC, SDHD, SMAD4, SMARCA4, SMARCB1, SMARCE1, STK11, SUFU, TERC, TERT, TMEM127, TP53, TSC1, TSC2, VHL, WRN and WT1.  he  report date is February 02, 2019.   02/11/2019 Surgery   RT lumpectomy: DICS Intermediate grade, Margins Neg, ER 90%, PR 70%; Lt Lumpectomy: UDH   02/26/2019 -  Anti-estrogen oral therapy   Tamoxifen $RemoveBe'20mg'[id number]$  daily, stopped because of depression and facial numbness   05/20/2019 - 06/17/2019 Radiation Therapy   Adjuvant XRT   08/15/2021 Relapse/Recurrence   MRI surveillance detected Right breast calcifications 5 cm, Ant biopsy: IG DCIS with necrosis, ER 40%, PR 0%, Posterior biopsy: Intermediate grade DCIS with ALH   AML (acute myeloid leukemia) ([place])  09/1998 Initial Diagnosis   AML (acute myeloid leukemia) ([place]) treated with 2 induction regimens followed by 6 consolidation regimens, remission     CHIEF COMPLIANT: Follow-up of right breast DCIS  INTERVAL HISTORY: Joann Meadows is a 45 y.o. with above-mentioned history of right breast DCIS for which she underwent a lumpectomy, radiation, and is currently on surveillance as she could not tolerate antiestrogen therapy with tamoxifen. She presents to the clinic today for follow-up.  She has not decided on what type of surgery she wants to do.  She is experiencing pain and discomfort ever since the biopsy.  She is very worried about long-term and short-term side effects of DIEP flap.  ALLERGIES:  is allergic to tamoxifen citrate.  MEDICATIONS:  Current Outpatient Medications  Medication Sig Dispense Refill   tamoxifen (NOLVADEX) 10 MG tablet Take 1 tablet (10 mg total) by mouth 2 (two) times daily. 30 tablet 3   ibuprofen (ADVIL) 200 MG tablet Take 2 tablets by mouth every 12 (twelve) hours as needed.     Polyethyl Glycol-Propyl Glycol 0.4-0.3 % SOLN Apply 2 drops to eye in the morning, at noon, in the evening, and at  bedtime.     triamcinolone (KENALOG) 0.025 % ointment Apply 1 application topically 2 (two) times daily. 30 g 0   No current facility-administered medications for this visit.    PHYSICAL EXAMINATION: ECOG PERFORMANCE STATUS: 1 -  Symptomatic but completely ambulatory  Vitals:   10/10/21 0911  BP: (!) 139/94  Pulse: 81  Resp: 18  Temp: 98.1 F (36.7 C)  SpO2: 99%   Filed Weights   10/10/21 0911  Weight: 166 lb 4.8 oz (75.4 kg)    BREAST: Tenderness in the left breast in the lower aspect and towards the axilla no palpable masses or nodules in either right or left breasts. No palpable axillary supraclavicular or infraclavicular adenopathy no breast tenderness or nipple discharge. (exam performed in the presence of a chaperone)  LABORATORY DATA:  I have reviewed the data as listed CMP Latest Ref Rng & Units 10/27/2019 06/16/2019 06/15/2019  Glucose 70 - 99 mg/dL 96 117(H) 111(H)  BUN 6 - 20 mg/dL $Remove'19 14 15  'KmuqNBx$ Creatinine 0.44 - 1.00 mg/dL 0.84 0.82 0.70  Sodium 135 - 145 mmol/L 141 142 143  Potassium 3.5 - 5.1 mmol/L 3.9 4.4 3.9  Chloride 98 - 111 mmol/L 107 109 109  CO2 22 - 32 mmol/L 25 24 -  Calcium 8.9 - 10.3 mg/dL 9.1 9.1 -  Total Protein 6.5 - 8.1 g/dL 7.2 6.9 -  Total Bilirubin 0.3 - 1.2 mg/dL 0.3 0.4 -  Alkaline Phos 38 - 126 U/L 59 45 -  AST 15 - 41 U/L 13(L) 11(L) -  ALT 0 - 44 U/L 15 12 -    Lab Results  Component Value Date   WBC 6.7 10/27/2019   HGB 13.1 10/27/2019   HCT 40.2 10/27/2019   MCV 91.8 10/27/2019   PLT 220 10/27/2019   NEUTROABS 4.2 10/27/2019    ASSESSMENT & PLAN:  Ductal carcinoma in situ (DCIS) of right breast 02/11/19: RT lumpectomy: DICS Intermediate grade, Margins Neg, ER 90%, PR 70%; Lt Lumpectomy: UDH Stage TisNx Stage 0    Plan: 1. Adj RT started 05/24/2019 2. Could not tolerate Tamoxifen (depression and facial numbness)   Recurrence: 08/15/2021:MRI surveillance detected Right breast calcifications 5 cm, Ant biopsy: IG DCIS with necrosis, ER 40%, PR 0%, Posterior biopsy: Intermediate grade DCIS with [facility]    Treatment plan:  Patient has not had surgery yet (she was contemplating on doing bilateral mastectomies with reconstruction) She is considering doing unilateral  mastectomy versus bilateral mastectomies.  I discussed with her that if she does have bilateral mastectomies and the final pathology is DCIS, she does not need antiestrogen therapy. If however on the final pathology there was invasive cancer then we would need to start her on tamoxifen.  I will prescribe her tamoxifen today so that she can see how she is going to feel like when she takes tamoxifen.  Since she could not tolerate 20 mg dose we will start her on 10 mg dose today.  She has an appointment next week to see her surgeons on 21 November. Right-sided lower rib cage pain: We will get a chest x-ray today.  No orders of the defined types were placed in this encounter.  The patient has a good understanding of the overall plan. she agrees with it. she will call with any problems that may develop before the next visit here.  Total time spent: 30 mins including face to face time and time spent for planning, charting and coordination of care  Joann Meadows K  Joann Adie, MD, MPH 10/10/2021  I, Joann Meadows, am acting as scribe for Dr. Nicholas Lose.  I have reviewed the above documentation for accuracy and completeness, and I agree with the above.

## 2021-10-10 ENCOUNTER — Inpatient Hospital Stay: Payer: BC Managed Care – PPO | Attending: Hematology and Oncology | Admitting: Hematology and Oncology

## 2021-10-10 ENCOUNTER — Ambulatory Visit (HOSPITAL_COMMUNITY)
Admission: RE | Admit: 2021-10-10 | Discharge: 2021-10-10 | Disposition: A | Discharge status: Home / Self Care | Payer: BC Managed Care – PPO | Source: Ambulatory Visit | Attending: Hematology and Oncology | Admitting: Hematology and Oncology

## 2021-10-10 ENCOUNTER — Other Ambulatory Visit: Payer: Self-pay

## 2021-10-10 ENCOUNTER — Telehealth: Payer: Self-pay | Admitting: *Deleted

## 2021-10-10 DIAGNOSIS — D0511 Intraductal carcinoma in situ of right breast: Secondary | ICD-10-CM

## 2021-10-10 DIAGNOSIS — R079 Chest pain, unspecified: Secondary | ICD-10-CM | POA: Diagnosis not present

## 2021-10-10 MED ORDER — TAMOXIFEN CITRATE 10 MG PO TABS: 10.0000 mg | ORAL_TABLET | Freq: Two times a day (BID) | ORAL | 3 refills | Status: DC

## 2021-10-10 NOTE — Assessment & Plan Note (Signed)
02/11/19:RT lumpectomy: DICS Intermediate grade, Margins Neg, ER 90%, PR 70%; Lt Lumpectomy: UDH Stage TisNx Stage 0  Plan: 1. Adj RTstarted 05/24/2019 2. Could not tolerate Tamoxifen (depression and facial numbness)  Recurrence: 08/15/2021:MRI surveillance detected Right breast calcifications 5 cm, Ant biopsy: IG DCIS with necrosis, ER 40%, PR 0%, Posterior biopsy: Intermediate grade DCIS with [facility]  Treatment plan:  Patient has not had surgery yet (she was contemplating on doing bilateral mastectomies with reconstruction)

## 2021-10-10 NOTE — Telephone Encounter (Signed)
Per MD pt chest x ray negative for rib fracture.  RN attempt x1 to contact pt.  No answer, LVM for pt to return call to the office.

## 2021-10-16 DIAGNOSIS — Z20828 Contact with and (suspected) exposure to other viral communicable diseases: Secondary | ICD-10-CM | POA: Diagnosis not present

## 2021-10-16 DIAGNOSIS — J019 Acute sinusitis, unspecified: Secondary | ICD-10-CM | POA: Diagnosis not present

## 2021-10-16 DIAGNOSIS — B9689 Other specified bacterial agents as the cause of diseases classified elsewhere: Secondary | ICD-10-CM | POA: Diagnosis not present

## 2021-10-17 ENCOUNTER — Telehealth: Payer: Self-pay | Admitting: Hematology and Oncology

## 2021-10-17 NOTE — Telephone Encounter (Signed)
Patient had many questions related to the care that she got in 2020 as well as the current plan. Patient wanted to know why she did not get Oncotype DX testing: I discussed with her that her earlier diagnosis was DCIS and Oncotype DX is not indicated for DCIS.  It is only indicated for invasive breast cancer. Patient wanted to know why she did not get ovarian function suppression with her prior diagnosis: I informed her that ovarian function suppression is primarily indicated for invasive breast cancer that is high risk in patients who are below age 47.  Therefore it was not recommended. Tamoxifen toxicities: Patient does have hot flashes and leg pains.  I instructed her to continue the 10 mg dosage and with time some of the symptoms will ease.

## 2021-10-18 ENCOUNTER — Other Ambulatory Visit: Payer: Self-pay | Admitting: Hematology and Oncology

## 2021-10-23 ENCOUNTER — Encounter: Payer: Self-pay | Admitting: *Deleted

## 2021-10-26 ENCOUNTER — Telehealth: Payer: Self-pay | Admitting: *Deleted

## 2021-10-26 NOTE — Telephone Encounter (Signed)
Spoke to pt regarding tx location. Pt has decided to have tx at [facility] with DIEP flap. Pt very appreciative for care and discussions she's had at [facility]. Informed pt to please reach out for future needs. Received verbal understanding.  Physician team notified.

## 2021-10-29 DIAGNOSIS — H18832 Recurrent erosion of cornea, left eye: Secondary | ICD-10-CM | POA: Diagnosis not present

## 2021-10-29 DIAGNOSIS — H04123 Dry eye syndrome of bilateral lacrimal glands: Secondary | ICD-10-CM | POA: Diagnosis not present

## 2021-10-29 DIAGNOSIS — H0102B Squamous blepharitis left eye, upper and lower eyelids: Secondary | ICD-10-CM | POA: Diagnosis not present

## 2021-10-29 DIAGNOSIS — H0102A Squamous blepharitis right eye, upper and lower eyelids: Secondary | ICD-10-CM | POA: Diagnosis not present

## 2021-11-02 ENCOUNTER — Other Ambulatory Visit: Payer: Self-pay | Admitting: Hematology and Oncology

## 2021-11-12 DIAGNOSIS — D0511 Intraductal carcinoma in situ of right breast: Secondary | ICD-10-CM | POA: Diagnosis not present

## 2021-11-12 DIAGNOSIS — Z923 Personal history of irradiation: Secondary | ICD-10-CM | POA: Diagnosis not present

## 2021-11-17 ENCOUNTER — Other Ambulatory Visit: Payer: Self-pay | Admitting: Hematology and Oncology

## 2021-11-21 NOTE — Telephone Encounter (Signed)
Per Dr. Lindi Adie phone note, tamoxifen 10 mg should still be taken. Gardiner Rhyme, RN

## 2021-11-22 DIAGNOSIS — M79674 Pain in right toe(s): Secondary | ICD-10-CM | POA: Diagnosis not present

## 2021-11-22 DIAGNOSIS — Z923 Personal history of irradiation: Secondary | ICD-10-CM | POA: Diagnosis not present

## 2021-11-22 DIAGNOSIS — H5702 Anisocoria: Secondary | ICD-10-CM | POA: Diagnosis not present

## 2021-11-22 DIAGNOSIS — F418 Other specified anxiety disorders: Secondary | ICD-10-CM | POA: Diagnosis not present

## 2021-11-22 DIAGNOSIS — Z01818 Encounter for other preprocedural examination: Secondary | ICD-10-CM | POA: Diagnosis not present

## 2021-11-22 DIAGNOSIS — C50911 Malignant neoplasm of unspecified site of right female breast: Secondary | ICD-10-CM | POA: Diagnosis not present

## 2021-11-22 DIAGNOSIS — D0511 Intraductal carcinoma in situ of right breast: Secondary | ICD-10-CM | POA: Diagnosis not present

## 2021-11-22 DIAGNOSIS — R030 Elevated blood-pressure reading, without diagnosis of hypertension: Secondary | ICD-10-CM | POA: Diagnosis not present

## 2021-11-22 DIAGNOSIS — H04123 Dry eye syndrome of bilateral lacrimal glands: Secondary | ICD-10-CM | POA: Diagnosis not present

## 2021-11-25 HISTORY — PX: MASTECTOMY: SHX3

## 2021-11-26 DIAGNOSIS — H18832 Recurrent erosion of cornea, left eye: Secondary | ICD-10-CM | POA: Diagnosis not present

## 2021-11-26 DIAGNOSIS — H04123 Dry eye syndrome of bilateral lacrimal glands: Secondary | ICD-10-CM | POA: Diagnosis not present

## 2021-11-26 DIAGNOSIS — H0102A Squamous blepharitis right eye, upper and lower eyelids: Secondary | ICD-10-CM | POA: Diagnosis not present

## 2021-11-26 DIAGNOSIS — H0102B Squamous blepharitis left eye, upper and lower eyelids: Secondary | ICD-10-CM | POA: Diagnosis not present

## 2021-11-28 ENCOUNTER — Other Ambulatory Visit: Payer: Self-pay | Admitting: Hematology and Oncology

## 2021-11-28 DIAGNOSIS — Z20822 Contact with and (suspected) exposure to covid-19: Secondary | ICD-10-CM | POA: Diagnosis not present

## 2021-11-28 DIAGNOSIS — D0511 Intraductal carcinoma in situ of right breast: Secondary | ICD-10-CM | POA: Diagnosis not present

## 2021-11-28 DIAGNOSIS — Z01818 Encounter for other preprocedural examination: Secondary | ICD-10-CM | POA: Diagnosis not present

## 2021-11-29 DIAGNOSIS — Z87891 Personal history of nicotine dependence: Secondary | ICD-10-CM | POA: Diagnosis not present

## 2021-11-29 DIAGNOSIS — G8929 Other chronic pain: Secondary | ICD-10-CM | POA: Diagnosis not present

## 2021-11-29 DIAGNOSIS — Z8249 Family history of ischemic heart disease and other diseases of the circulatory system: Secondary | ICD-10-CM | POA: Diagnosis not present

## 2021-11-29 DIAGNOSIS — Z421 Encounter for breast reconstruction following mastectomy: Secondary | ICD-10-CM | POA: Diagnosis not present

## 2021-11-29 DIAGNOSIS — Z823 Family history of stroke: Secondary | ICD-10-CM | POA: Diagnosis not present

## 2021-11-29 DIAGNOSIS — Z171 Estrogen receptor negative status [ER-]: Secondary | ICD-10-CM | POA: Diagnosis not present

## 2021-11-29 DIAGNOSIS — H04123 Dry eye syndrome of bilateral lacrimal glands: Secondary | ICD-10-CM | POA: Diagnosis not present

## 2021-11-29 DIAGNOSIS — Z9889 Other specified postprocedural states: Secondary | ICD-10-CM | POA: Diagnosis not present

## 2021-11-29 DIAGNOSIS — Z833 Family history of diabetes mellitus: Secondary | ICD-10-CM | POA: Diagnosis not present

## 2021-11-29 DIAGNOSIS — Z800 Family history of malignant neoplasm of digestive organs: Secondary | ICD-10-CM | POA: Diagnosis not present

## 2021-11-29 DIAGNOSIS — M79674 Pain in right toe(s): Secondary | ICD-10-CM | POA: Diagnosis not present

## 2021-11-29 DIAGNOSIS — D0511 Intraductal carcinoma in situ of right breast: Secondary | ICD-10-CM | POA: Diagnosis not present

## 2021-11-29 DIAGNOSIS — N6021 Fibroadenosis of right breast: Secondary | ICD-10-CM | POA: Diagnosis not present

## 2021-11-29 DIAGNOSIS — Z923 Personal history of irradiation: Secondary | ICD-10-CM | POA: Diagnosis not present

## 2021-11-29 DIAGNOSIS — Z807 Family history of other malignant neoplasms of lymphoid, hematopoietic and related tissues: Secondary | ICD-10-CM | POA: Diagnosis not present

## 2021-11-29 DIAGNOSIS — Z856 Personal history of leukemia: Secondary | ICD-10-CM | POA: Diagnosis not present

## 2021-11-29 DIAGNOSIS — F419 Anxiety disorder, unspecified: Secondary | ICD-10-CM | POA: Diagnosis not present

## 2021-11-29 DIAGNOSIS — Z9011 Acquired absence of right breast and nipple: Secondary | ICD-10-CM | POA: Diagnosis not present

## 2021-11-29 DIAGNOSIS — I10 Essential (primary) hypertension: Secondary | ICD-10-CM | POA: Diagnosis not present

## 2021-12-07 DIAGNOSIS — D0511 Intraductal carcinoma in situ of right breast: Secondary | ICD-10-CM | POA: Diagnosis not present

## 2021-12-07 DIAGNOSIS — Z9011 Acquired absence of right breast and nipple: Secondary | ICD-10-CM | POA: Diagnosis not present

## 2021-12-11 NOTE — Assessment & Plan Note (Signed)
02/11/19:RT lumpectomy: DICS Intermediate grade, Margins Neg, ER 90%, PR 70%; Lt Lumpectomy: UDH Stage TisNx Stage 0  Plan: 1. Adj RTstarted 05/24/2019 2. Could not tolerate Tamoxifen (depression and facial numbness)  Recurrence: 08/15/2021:MRI surveillance detected Right breast calcifications 5 cm, Ant biopsy: IG DCIS with necrosis, ER 40%, PR 0%, Posterior biopsy:Intermediate grade DCIS withALH  Treatment plan:  1. Neoadj Tamoxifen 2. Surgery: Bilateral mastectomies vs unilateral mastectomy

## 2021-12-11 NOTE — Progress Notes (Signed)
Patient Care Team: [name], L.Marlou Sa, MD as PCP - General (Family Medicine) Rockwell Germany, RN as Oncology Nurse Navigator [name], RN as Oncology Nurse Navigator  DIAGNOSIS:    ICD-10-CM   1. Ductal carcinoma in situ (DCIS) of right breast  D05.11       SUMMARY OF ONCOLOGIC HISTORY: Oncology History  Ductal carcinoma in situ (DCIS) of right breast  01/25/2019 Initial Diagnosis   Screening mammogram showed bilateral calcifications right breast 5 mm, pleomorphic.  Left breast 1.6 cm punctate, smaller group of calcifications spanning 3 mm.  Right lower outer quadrant biopsy revealed low-grade DCIS ER 90%, PR 70%; biopsy of left breast calcifications ALH and PASH   01/31/2019 Genetic Testing   ATM c.8495G>A and RECQL4 c.2587G>A VUS identified on the 9-gene STAT panel and Multi-cancer panel through [facility].  The STAT Breast cancer panel offered by [facility] includes sequencing and rearrangement analysis for the following 9 genes:  ATM, BRCA1, BRCA2, CDH1, CHEK2, PALB2, PTEN, STK11 and TP53.   The Multi-Gene Panel offered by [facility] includes sequencing and/or deletion duplication testing of the following 84 genes: AIP, ALK, APC, ATM, AXIN2,BAP1,  BARD1, BLM, BMPR1A, BRCA1, BRCA2, BRIP1, CASR, CDC73, CDH1, CDK4, CDKN1B, CDKN1C, CDKN2A (p14ARF), CDKN2A (p16INK4a), CEBPA, CHEK2, CTNNA1, DICER1, DIS3L2, EGFR (c.2369C>T, p.Thr790Met variant only), EPCAM (Deletion/duplication testing only), FH, FLCN, GATA2, GPC3, GREM1 (Promoter region deletion/duplication testing only), HOXB13 (c.251G>A, p.Gly84Glu), HRAS, KIT, MAX, MEN1, MET, MITF (c.952G>A, p.Glu318Lys variant only), MLH1, MSH2, MSH3, MSH6, MUTYH, NBN, NF1, NF2, NTHL1, PALB2, PDGFRA, PHOX2B, PMS2, POLD1, POLE, POT1, PRKAR1A, PTCH1, PTEN, RAD50, RAD51C, RAD51D, RB1, RECQL4, RET, RUNX1, SDHAF2, SDHA (sequence changes only), SDHB, SDHC, SDHD, SMAD4, SMARCA4, SMARCB1, SMARCE1, STK11, SUFU, TERC, TERT, TMEM127, TP53, TSC1, TSC2, VHL, WRN and WT1.  he  report date is February 02, 2019.   02/11/2019 Surgery   RT lumpectomy: DICS Intermediate grade, Margins Neg, ER 90%, PR 70%; Lt Lumpectomy: UDH   02/26/2019 -  Anti-estrogen oral therapy   Tamoxifen 25m daily, stopped because of depression and facial numbness   05/20/2019 - 06/17/2019 Radiation Therapy   Adjuvant XRT   08/15/2021 Relapse/Recurrence   MRI surveillance detected Right breast calcifications 5 cm, Ant biopsy: IG DCIS with necrosis, ER 40%, PR 0%, Posterior biopsy: Intermediate grade DCIS with ALH   AML (acute myeloid leukemia) ([place]  09/1998 Initial Diagnosis   [place] treated with 2 induction regimens followed by 6 consolidation regimens, remission     CHIEF COMPLIANT: Follow-up of right breast DC[facility] a 46y.[facility]. with above-mentioned history of right breast DCIS for which she underwent a lumpectomy.  She had MRI for surveillance which detected right breast calcifications that was 5 cm of DCIS.  Initial ER 40% PR 0%.  [facility] with microinvasion that was ER 0%, PR 0%, HER2 positive.  She is recovering very well from the surgery and is here today accompanied by her husband to discuss adjuvant treatment plan.   ALLERGIES:  is allergic to tamoxifen citrate.  MEDICATIONS:  Current Outpatient Medications  Medication Sig Dispense Refill   ibuprofen (ADVIL) 200 MG tablet Take 2 tablets by mouth every 12 (twelve) hours as needed.     Polyethyl Glycol-Propyl Glycol 0.4-0.3 % SOLN Apply 2 drops to eye in the morning, at noon, in the evening, and at bedtime.     tamoxifen (NOLVADEX) 10 MG tablet TAKE 1 TABLET  BY MOUTH TWICE A DAY 30 tablet 3   triamcinolone (KENALOG) 0.025 % ointment Apply 1 application topically 2 (two) times daily. 30 g 0   No current facility-administered medications for this visit.    PHYSICAL EXAMINATION: ECOG  PERFORMANCE STATUS: 1 - Symptomatic but completely ambulatory  Vitals:   12/12/21 0923  BP: 120/70  Pulse: 79  Resp: 18  Temp: 97.9 F (36.6 C)  SpO2: 100%   Filed Weights   12/12/21 0923  Weight: 164 lb 9 oz (74.6 kg)      LABORATORY DATA:  I have reviewed the data as listed CMP Latest Ref Rng & Units 10/27/2019 06/16/2019 06/15/2019  Glucose 70 - 99 mg/dL 96 117(H) 111(H)  BUN 6 - 20 mg/dL _0 Creatinine 0.44 - 1.00 mg/dL 0.84 0.82 0.70  Sodium 135 - 145 mmol/L 141 142 143  Potassium 3.5 - 5.1 mmol/L 3.9 4.4 3.9  Chloride 98 - 111 mmol/L 107 109 109  CO2 22 - 32 mmol/L 25 24 -  Calcium 8.9 - 10.3 mg/dL 9.1 9.1 -  Total Protein 6.5 - 8.1 g/dL 7.2 6.9 -  Total Bilirubin 0.3 - 1.2 mg/dL 0.3 0.4 -  Alkaline Phos 38 - 126 U/L 59 45 -  AST 15 - 41 U/L 13(L) 11(L) -  ALT 0 - 44 U/L 15 12 -    Lab Results  Component Value Date   WBC 6.7 10/27/2019   HGB 13.1 10/27/2019   HCT 40.2 10/27/2019   MCV 91.8 10/27/2019   PLT 220 10/27/2019   NEUTROABS 4.2 10/27/2019    ASSESSMENT & PLAN:  Ductal carcinoma in situ (DCIS) of right breast 02/11/19: RT lumpectomy: DICS Intermediate grade, Margins Neg, ER 90%, PR 70%; Lt Lumpectomy: UDH status post radiation, could not tolerate tamoxifen recurrence: 08/15/2021:MRI surveillance detected Right breast calcifications 5 cm, Ant biopsy: IG DCIS with necrosis, ER 40%, PR 0%, Posterior biopsy: Intermediate grade DCIS with [facility]  11/29/2021: Right mastectomy: High-grade DCIS with microinvasion, ER 0%, PR 0%, HER2 positive, 0/1 lymph node negative  Plan: I discussed with the patient that the HER2 positivity on the mastectomy specimen does not warrant any systemic treatment  Since the tumor is ER/PR negative there is really no role for antiestrogen therapy but if she wants to take tamoxifen for contralateral breast cancer prevention, it would be perfectly reasonable.  We even discussed reducing the dosage to 5 mg. Patient wants [place] to do  Oncotype DX testing and I discussed with her that it does not indicated and therefore we do not intend to obtain it. Patient wanted to know if she can do Zoladex for ovarian suppression and even consider doing the oophorectomy or hysterectomy.  I discussed with her that there is no benefit for doing that given that she is estrogen receptor negative.  Treatment plan:  Patient will be getting a second opinion at [place] Return to clinic in 6 months for follow-up. Surveillance will be with mammogram on the left breast and a breast MRI.  We will alternate both of these tests.   No orders of the defined types were placed in this encounter.  The patient has a good understanding of the overall plan. she agrees with it. she will call with any problems that may develop before the next visit here.  Total time spent: 45 mins including face to face time and time spent for planning, charting and coordination of care  Rulon Eisenmenger, MD, MPH 12/12/2021  I, Delma Freeze  Evans, am acting as scribe for Dr. Nicholas Lose.  I have reviewed the above documentation for accuracy and completeness, and I agree with the above.

## 2021-12-12 ENCOUNTER — Inpatient Hospital Stay: Payer: BC Managed Care – PPO | Attending: Hematology and Oncology | Admitting: Hematology and Oncology

## 2021-12-12 ENCOUNTER — Other Ambulatory Visit: Payer: Self-pay

## 2021-12-12 DIAGNOSIS — Z923 Personal history of irradiation: Secondary | ICD-10-CM | POA: Insufficient documentation

## 2021-12-12 DIAGNOSIS — Z79899 Other long term (current) drug therapy: Secondary | ICD-10-CM | POA: Insufficient documentation

## 2021-12-12 DIAGNOSIS — Z9011 Acquired absence of right breast and nipple: Secondary | ICD-10-CM | POA: Insufficient documentation

## 2021-12-12 DIAGNOSIS — N6489 Other specified disorders of breast: Secondary | ICD-10-CM | POA: Diagnosis not present

## 2021-12-12 DIAGNOSIS — C9201 Acute myeloblastic leukemia, in remission: Secondary | ICD-10-CM | POA: Insufficient documentation

## 2021-12-12 DIAGNOSIS — D0511 Intraductal carcinoma in situ of right breast: Secondary | ICD-10-CM | POA: Insufficient documentation

## 2021-12-14 DIAGNOSIS — D0511 Intraductal carcinoma in situ of right breast: Secondary | ICD-10-CM | POA: Diagnosis not present

## 2021-12-14 DIAGNOSIS — Z9011 Acquired absence of right breast and nipple: Secondary | ICD-10-CM | POA: Diagnosis not present

## 2021-12-19 ENCOUNTER — Other Ambulatory Visit: Payer: Self-pay | Admitting: Hematology and Oncology

## 2022-01-01 DIAGNOSIS — C50911 Malignant neoplasm of unspecified site of right female breast: Secondary | ICD-10-CM | POA: Diagnosis not present

## 2022-01-01 DIAGNOSIS — Z9011 Acquired absence of right breast and nipple: Secondary | ICD-10-CM | POA: Diagnosis not present

## 2022-01-01 DIAGNOSIS — D0511 Intraductal carcinoma in situ of right breast: Secondary | ICD-10-CM | POA: Diagnosis not present

## 2022-01-16 ENCOUNTER — Other Ambulatory Visit: Payer: Self-pay | Admitting: Hematology and Oncology

## 2022-01-22 DIAGNOSIS — Z9011 Acquired absence of right breast and nipple: Secondary | ICD-10-CM | POA: Diagnosis not present

## 2022-01-22 DIAGNOSIS — D0511 Intraductal carcinoma in situ of right breast: Secondary | ICD-10-CM | POA: Diagnosis not present

## 2022-01-22 DIAGNOSIS — C50911 Malignant neoplasm of unspecified site of right female breast: Secondary | ICD-10-CM | POA: Diagnosis not present

## 2022-01-22 DIAGNOSIS — Z923 Personal history of irradiation: Secondary | ICD-10-CM | POA: Diagnosis not present

## 2022-01-26 DIAGNOSIS — S93601A Unspecified sprain of right foot, initial encounter: Secondary | ICD-10-CM | POA: Diagnosis not present

## 2022-01-28 DIAGNOSIS — H0102B Squamous blepharitis left eye, upper and lower eyelids: Secondary | ICD-10-CM | POA: Diagnosis not present

## 2022-01-28 DIAGNOSIS — H0102A Squamous blepharitis right eye, upper and lower eyelids: Secondary | ICD-10-CM | POA: Diagnosis not present

## 2022-01-28 DIAGNOSIS — H18832 Recurrent erosion of cornea, left eye: Secondary | ICD-10-CM | POA: Diagnosis not present

## 2022-01-28 DIAGNOSIS — H04123 Dry eye syndrome of bilateral lacrimal glands: Secondary | ICD-10-CM | POA: Diagnosis not present

## 2022-01-30 ENCOUNTER — Other Ambulatory Visit: Payer: Self-pay

## 2022-01-30 ENCOUNTER — Ambulatory Visit: Payer: BC Managed Care – PPO | Attending: Physician Assistant | Admitting: Rehabilitation

## 2022-01-30 ENCOUNTER — Encounter: Payer: Self-pay | Admitting: Rehabilitation

## 2022-01-30 DIAGNOSIS — R293 Abnormal posture: Secondary | ICD-10-CM | POA: Diagnosis not present

## 2022-01-30 DIAGNOSIS — D0511 Intraductal carcinoma in situ of right breast: Secondary | ICD-10-CM | POA: Insufficient documentation

## 2022-01-30 DIAGNOSIS — M25611 Stiffness of right shoulder, not elsewhere classified: Secondary | ICD-10-CM | POA: Insufficient documentation

## 2022-01-30 DIAGNOSIS — Z483 Aftercare following surgery for neoplasm: Secondary | ICD-10-CM | POA: Insufficient documentation

## 2022-01-30 DIAGNOSIS — Z9189 Other specified personal risk factors, not elsewhere classified: Secondary | ICD-10-CM | POA: Insufficient documentation

## 2022-01-30 DIAGNOSIS — M542 Cervicalgia: Secondary | ICD-10-CM | POA: Insufficient documentation

## 2022-01-30 NOTE — Therapy (Signed)
OUTPATIENT PHYSICAL THERAPY ONCOLOGY EVALUATION  Patient Name: Beckie Viscardi MRN: [id number] DOB:[birth year], 46 y.o., female Today's Date: 01/30/2022   PT End of Session - 01/30/22 1211     Visit Number 1    Number of Visits 6    Date for PT Re-Evaluation 03/13/22    PT Start Time 1100    PT Stop Time 3419    PT Time Calculation (min) 49 min    Activity Tolerance Patient tolerated treatment well    Behavior During Therapy [facility] for tasks assessed/performed             Past Medical History:  Diagnosis Date   Blood transfusion without reported diagnosis    leaukemia 18 years ago   Breast cancer ([place]) 2020   Right Breast Cancer   Endometriosis    Family history of colon cancer    Family history of leukemia    Fibroid    Hx of radiation therapy 06/2019   Leukemia ([place])    20 years ago   Personal history of radiation therapy 2020   Right Breast Cancer   PONV (postoperative nausea and vomiting)    pt states she vomits after colonoscopies   Past Surgical History:  Procedure Laterality Date   BREAST EXCISIONAL BIOPSY Left 02/11/2019   [facility]   BREAST LUMPECTOMY Right 02/11/2019   BREAST LUMPECTOMY WITH RADIOACTIVE SEED LOCALIZATION Bilateral 02/11/2019   Procedure: BILATERAL BREAST LUMPECTOMIES WITH BILATERAL RADIOACTIVE SEED LOCALIZATION;  Surgeon: Donnie Mesa, MD;  Location: [place];  Service: General;  Laterality: Bilateral;   Patient Active Problem List   Diagnosis Date Noted   COPD possible, would be Gold Group B 11/25/2019   DOE (dyspnea on exertion) 11/24/2019   Genetic testing 02/02/2019   Ductal carcinoma in situ (DCIS) of right breast 01/25/2019   AML (acute myeloid leukemia) ([place]) 01/25/2019   Family history of colon cancer    Family history of leukemia    Delivery normal 03/25/2017    PCP: [name], L.Marlou Sa, MD  REFERRING PROVIDER: Candace Gallus, PA  REFERRING DIAG: Rt breast cancer  THERAPY DIAG:  Ductal carcinoma in situ  (DCIS) of right breast - Plan: PT plan of care cert/re-cert  Aftercare following surgery for neoplasm - Plan: PT plan of care cert/re-cert  Stiffness of right shoulder, not elsewhere classified - Plan: PT plan of care cert/re-cert  At risk for lymphedema - Plan: PT plan of care cert/re-cert  ONSET DATE: 04/26/21  SUBJECTIVE  SUBJECTIVE STATEMENT: My problems are at night. I feel uncomfortable.  I get pain in my right hand and armpit. Denies swelling.  The expander is uncomfortable.  Has more fills coming.  No other treatment coming up.   PERTINENT HISTORY:  Leukemia history, previous Rt breast lumpectomy and radiation in 2020. Rt mastectomy with expander placement and SLNB 0/1 LN positive on 11/29/21.    PAIN:  Are you having pain? Yes NPRS scale: 3/10 at rest, at the worst 8/10 in the evening and at night  Pain location: Rt axilla and anterior breast region Pain orientation: Right  PAIN TYPE: aching, burning, and sharp Pain description: intermittent and constant  Aggravating factors: at the end of the day Relieving factors: just OTC as needed   PRECAUTIONS: Other: lymphedema risk Rt UE  WEIGHT BEARING RESTRICTIONS No  FALLS:  Has patient fallen in last 6 months? No  LIVING ENVIRONMENT: Lives with: lives with their family and lives with their partner: Lino with 2 children 4 and 10 Lives in: House/apartment Has following equipment at home: None  OCCUPATION: stay at home mom.  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 I have help.   LEISURE: nothing extra  HAND DOMINANCE : bilateral   PRIOR LEVEL OF FUNCTION: Independent  PATIENT GOALS improve function   OBJECTIVE  COGNITION:  Overall cognitive status: Within functional limits for tasks assessed   PALPATION: Expander very  soft.  Extra skin lateral trunk which pt states they will remove in next surgery.  Mild to moderate tightness in end range motion only flexion and abduction in the pectoralis and latissimus. No edema noted  OBSERVATIONS / OTHER ASSESSMENTS: using surgical bra and open front bras.    POSTURE: rounded shoulders  UPPER EXTREMITY AROM/PROM:  A/PROM RIGHT  01/30/2022   Shoulder extension 45  Shoulder flexion 150 - tight in axilla   Shoulder abduction 160- tight in axilla   Shoulder internal rotation 70  Shoulder external rotation 90    (Blank rows = not tested)  A/PROM LEFT  01/30/2022  Shoulder extension 30  Shoulder flexion 175  Shoulder abduction 164  Shoulder internal rotation   Shoulder external rotation 105    (Blank rows = not tested)    LYMPHEDEMA ASSESSMENTS:   LANDMARK RIGHT  01/30/2022  10 cm proximal to olecranon process 32  Olecranon process 27.5  10 cm proximal to ulnar styloid process 23.5  Just proximal to ulnar styloid process 15.7  Across hand at thumb web space 18.5  At base of 2nd digit 6.4  (Blank rows = not tested)  LANDMARK LEFT  01/30/2022  10 cm proximal to olecranon process 31  Olecranon process 27.5  10 cm proximal to ulnar styloid process 22.6  Just proximal to ulnar styloid process 15.5  Across hand at thumb web space 18  At base of 2nd digit 6.3  (Blank rows = not tested)   QUICK DASH SURVEY: 65%   TODAY'S TREATMENT  Instruction and performance of HEP per below x 2 each Education on lymphedema risk reduction and gave info for second to nature as pt is having trouble finding a comfortable bra.  PROM into flexion, abduction x 5 each with axillary blocking  PATIENT EDUCATION:  Education details: lymphedema risk reduction and prevention as well as surveillance, POC, initial HEP Person educated: Patient Education method: Explanation, Demonstration, Verbal cues, and Handouts Education comprehension: verbalized understanding and returned  demonstration   HOME EXERCISE PROGRAM: Access Code: [email or web address] URL: [email or web address]:  03/08/2023Prepared by: Marcene Brawn TevisExercises  Supine Shoulder Flexion Extension AAROM with Dowel - 1-2 x daily - 7 x weekly - 10 reps - 5 seconds hold  Supine Chest Stretch with Elbows Bent - 1 x daily - 7 x weekly - 1 sets - 3 reps - 30-60seconds hold  Standing Shoulder Abduction Slides at Wall - 1 x daily - 7 x weekly - 1 sets - 5 reps - 6 second hold  ASSESSMENT: CLINICAL IMPRESSION: Patient is a 46  y.o. female who was seen today for physical therapy evaluation and treatment for post operative Rt mastectomy with expander placement due to recurrence.  Pt was never educated on lymphedema and risk of this so visit was similar to pre-op visit for this center with education on lymphedema and risk reduction as well as with measurements taken for the UEs.  Pt was also educated on similar to post op exercise sheet per above.  Overall pt has excellent ROM with mild end range stretch and tightness noted.  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     OBJECTIVE IMPAIRMENTS decreased knowledge of use of DME, decreased ROM, decreased strength, and impaired flexibility.   ACTIVITY LIMITATIONS cleaning, laundry, and yard work.   PERSONAL FACTORS 3+ comorbidities: radiaiton history, SLNB, cancer x 2, leukemia history  are also affecting patient's functional outcome.    REHAB POTENTIAL: Excellent  CLINICAL DECISION MAKING: Stable/uncomplicated  EVALUATION COMPLEXITY: Low  GOALS: Goals reviewed with patient? Yes  SHORT TERM GOALS:  Pt will be educated on lymphedema risk reduction practices and how to watch for the initial signs of swelling.  Baseline:   Target date: 02/13/2022 Goal status: MET   LONG TERM GOALS:  Pt will improve UE flexion AROM to at least 170 to improve mobility Baseline: 150 Target date:  03/13/2022 Goal status: INITIAL  2.  Pt will be ind with final HEP for continued strength and mobility  Baseline:  Target date: 03/13/2022 Goal status: INITIAL  PLAN: PT FREQUENCY: 1x/week  PT DURATION: 6 weeks  PLANNED INTERVENTIONS: Therapeutic exercises, Patient/Family education, and Manual therapy  PLAN FOR NEXT SESSION: Rt UE PROM with STM release, pulleys, ball, tell pt about ABC class,  *Do sozo around last visit for baseline as pt has no subjective symptoms of swelling   Stark Bray, PT 01/30/2022, 12:11 PM

## 2022-02-04 DIAGNOSIS — D0511 Intraductal carcinoma in situ of right breast: Secondary | ICD-10-CM | POA: Diagnosis not present

## 2022-02-04 DIAGNOSIS — Z9011 Acquired absence of right breast and nipple: Secondary | ICD-10-CM | POA: Diagnosis not present

## 2022-02-05 DIAGNOSIS — R030 Elevated blood-pressure reading, without diagnosis of hypertension: Secondary | ICD-10-CM | POA: Diagnosis not present

## 2022-02-07 ENCOUNTER — Telehealth: Payer: Self-pay | Admitting: Hematology and Oncology

## 2022-02-07 NOTE — Telephone Encounter (Signed)
.  Called patient to schedule appointment per 3/14 inbasket, patient is aware of date and time.   ?

## 2022-02-08 ENCOUNTER — Encounter: Payer: Self-pay | Admitting: Rehabilitation

## 2022-02-08 ENCOUNTER — Other Ambulatory Visit: Payer: Self-pay

## 2022-02-08 ENCOUNTER — Ambulatory Visit: Payer: BC Managed Care – PPO | Admitting: Rehabilitation

## 2022-02-08 DIAGNOSIS — Z483 Aftercare following surgery for neoplasm: Secondary | ICD-10-CM

## 2022-02-08 DIAGNOSIS — D0511 Intraductal carcinoma in situ of right breast: Secondary | ICD-10-CM | POA: Diagnosis not present

## 2022-02-08 DIAGNOSIS — R293 Abnormal posture: Secondary | ICD-10-CM | POA: Diagnosis not present

## 2022-02-08 DIAGNOSIS — Z9189 Other specified personal risk factors, not elsewhere classified: Secondary | ICD-10-CM | POA: Diagnosis not present

## 2022-02-08 DIAGNOSIS — M25611 Stiffness of right shoulder, not elsewhere classified: Secondary | ICD-10-CM | POA: Diagnosis not present

## 2022-02-08 DIAGNOSIS — M542 Cervicalgia: Secondary | ICD-10-CM | POA: Diagnosis not present

## 2022-02-08 NOTE — Therapy (Signed)
?OUTPATIENT PHYSICAL THERAPY ONCOLOGY TREATMENT ? ?Patient Name: Joann Meadows ?MRN: [id number] ?DOB:1976/07/30, 46 y.o., female ?Today's Date: 02/08/2022 ? ? PT End of Session - 02/08/22 1055   ? ? Visit Number 2   ? Number of Visits 6   ? Date for PT Re-Evaluation 03/13/22   ? PT Start Time 1100   ? PT Stop Time 0630   ? PT Time Calculation (min) 42 min   ? Activity Tolerance Patient tolerated treatment well   ? Behavior During Therapy [facility] for tasks assessed/performed   ? ?  ?  ? ?  ? ? ?Past Medical History:  ?Diagnosis Date  ? Blood transfusion without reported diagnosis   ? leaukemia 18 years ago  ? Breast cancer ([place]) 2020  ? Right Breast Cancer  ? Endometriosis   ? Family history of colon cancer   ? Family history of leukemia   ? Fibroid   ? Hx of radiation therapy 06/2019  ? Leukemia ([place])   ? 20 years ago  ? Personal history of radiation therapy 2020  ? Right Breast Cancer  ? PONV (postoperative nausea and vomiting)   ? pt states she vomits after colonoscopies  ? ?Past Surgical History:  ?Procedure Laterality Date  ? BREAST EXCISIONAL BIOPSY Left 02/11/2019  ? [place]  ? BREAST LUMPECTOMY Right 02/11/2019  ? BREAST LUMPECTOMY WITH RADIOACTIVE SEED LOCALIZATION Bilateral 02/11/2019  ? Procedure: BILATERAL BREAST LUMPECTOMIES WITH BILATERAL RADIOACTIVE SEED LOCALIZATION;  Surgeon: Donnie Mesa, MD;  Location: [place];  Service: General;  Laterality: Bilateral;  ? ?Patient Active Problem List  ? Diagnosis Date Noted  ? COPD possible, would be Gold Group B 11/25/2019  ? DOE (dyspnea on exertion) 11/24/2019  ? Genetic testing 02/02/2019  ? Ductal carcinoma in situ (DCIS) of right breast 01/25/2019  ? AML (acute myeloid leukemia) ([place]) 01/25/2019  ? Family history of colon cancer   ? Family history of leukemia   ? Delivery normal 03/25/2017  ? ? ?PCP: [name], L.Marlou Sa, MD ? ?REFERRING PROVIDER: Candace Gallus, PA ? ?REFERRING DIAG: Rt breast cancer ? ?THERAPY DIAG:  ?Ductal carcinoma in situ  (DCIS) of right breast ? ?Aftercare following surgery for neoplasm ? ?Stiffness of right shoulder, not elsewhere classified ? ?At risk for lymphedema ? ?ONSET DATE: 11/29/21 ? ?SUBJECTIVE                                                                                                                                                                                          ? ?SUBJECTIVE STATEMENT: ?I got my last fill Monday and now it is stretched and tight.  500-550cc ?  ? ?  PERTINENT HISTORY:  ?Leukemia history, previous Rt breast lumpectomy and radiation in 2020. Rt mastectomy with expander placement and SLNB 0/1 LN positive on 11/29/21.   ? ?PAIN:  ?Are you having pain? Yes ?NPRS scale: 3/10 at rest, at the worst 8/10 in the evening and at night  ?Pain location: Rt axilla and anterior breast region ?Pain orientation: Right  ?PAIN TYPE: aching, burning, and sharp ?Pain description: intermittent and constant  ?Aggravating factors: at the end of the day ?Relieving factors: just OTC as needed  ? ?PRECAUTIONS: Other: lymphedema risk Rt UE ? 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  ? ?OBJECTIVE ? ?UPPER EXTREMITY AROM/PROM: ? ?A/PROM RIGHT  02/08/2022 ?  ?Shoulder extension 45  ?Shoulder flexion 150 - tight in axilla   ?Shoulder abduction 160- tight in axilla   ?Shoulder internal rotation 70  ?Shoulder external rotation 90  ?  (Blank rows = not tested) ? ?A/PROM LEFT  02/08/2022  ?Shoulder extension 30  ?Shoulder flexion 175  ?Shoulder abduction 164  ?Shoulder internal rotation   ?Shoulder external rotation 105  ?  (Blank rows = not tested) ? ?TODAY'S TREATMENT  ?02/08/22 ?Pulleys flexion x 52mn and abduction x 373m with initial cueing - have handout on how to purchase ?Wall ball flexion 5" x 3 ?MT: PROM into flexion, abduction, ER, and D2 flexion and axillary release and latissimus MFR incorporated ?STM to pectoralis, serratus, Latissimus, in  supine ? ? ?01/30/22 ?Instruction and performance of HEP per below x 2 each ?Education on lymphedema risk reduction and gave info for second to nature as pt is having trouble finding a comfortable bra.  ?PROM into flexion, abduction x 5 each with axillary blocking ? ?PATIENT EDUCATION:  ?Education details: lymphedema risk reduction and prevention as well as surveillance, POC, initial HEP ?Person educated: Patient ?Education method: Explanation, Demonstration, Verbal cues, and Handouts ?Education comprehension: verbalized understanding and returned demonstration ? ? ?HOME EXERCISE PROGRAM: ?Access Code: [email or web address]: [email or web address]: 03/08/2023Prepared by: KaMarcene BrawnevisExercises  ?Supine Shoulder Flexion Extension AAROM with Dowel - 1-2 x daily - 7 x weekly - 10 reps - 5 seconds hold  ?Supine Chest Stretch with Elbows Bent - 1 x daily - 7 x weekly - 1 sets - 3 reps - 30-60seconds hold  ?Standing Shoulder Abduction Slides at Wall - 1 x daily - 7 x weekly - 1 sets - 5 reps - 6 second hold ? ?ASSESSMENT: ?CLINICAL IMPRESSION: ?First session of TE and MT.  Overall pt has excellent ROM with mild end range stretch and tightness noted.  Trigger point noted in latissimus with less pain and tension post MT.  Pt reports feeling like a weight was lifted off of the arm post treatment.   ?GOALS: ?Goals reviewed with patient? Yes ? ?SHORT TERM GOALS: ? ?Pt will be educated on lymphedema risk reduction practices and how to watch for the initial signs of swelling.  ?Baseline:   ?Target date: 02/22/2022 ?Goal status: MET ? ? ?LONG TERM GOALS: ? ?Pt will improve UE flexion AROM to at least 170 to improve mobility ?Baseline: 150 ?Target date: 03/22/2022 ?Goal status: INITIAL ? ?2.  Pt will be ind with final HEP for continued strength and mobility  ?Baseline:  ?Target date: 03/22/2022 ?Goal status: INITIAL ? ?PLAN: ?PT FREQUENCY: 1x/week ? ?PT DURATION: 6 weeks ? ?PLANNED INTERVENTIONS: Therapeutic exercises,  Patient/Family education, and Manual therapy ? ?PLAN FOR NEXT SESSION: Rt UE PROM with STM  release, pulleys, ball, tell pt about ABC class, add supine scapular x 5 each yellow ?*Do sozo around last visit for baseline as pt has no subjective symptoms of swelling ? ? ?Stark Bray, PT ?02/08/2022, 11:43 AM  ?

## 2022-02-11 DIAGNOSIS — Z124 Encounter for screening for malignant neoplasm of cervix: Secondary | ICD-10-CM | POA: Diagnosis not present

## 2022-02-11 DIAGNOSIS — Z6830 Body mass index (BMI) 30.0-30.9, adult: Secondary | ICD-10-CM | POA: Diagnosis not present

## 2022-02-11 DIAGNOSIS — Z01419 Encounter for gynecological examination (general) (routine) without abnormal findings: Secondary | ICD-10-CM | POA: Diagnosis not present

## 2022-02-12 DIAGNOSIS — S93601D Unspecified sprain of right foot, subsequent encounter: Secondary | ICD-10-CM | POA: Diagnosis not present

## 2022-02-12 DIAGNOSIS — S92344A Nondisplaced fracture of fourth metatarsal bone, right foot, initial encounter for closed fracture: Secondary | ICD-10-CM | POA: Diagnosis not present

## 2022-02-12 DIAGNOSIS — S93601A Unspecified sprain of right foot, initial encounter: Secondary | ICD-10-CM | POA: Diagnosis not present

## 2022-02-12 DIAGNOSIS — E559 Vitamin D deficiency, unspecified: Secondary | ICD-10-CM | POA: Diagnosis not present

## 2022-02-13 NOTE — Progress Notes (Signed)
? ?Patient Care Team: ?[name], L.Marlou Sa, MD as PCP - General (Family Medicine) ?Rockwell Germany, RN as Oncology Nurse Navigator ?[name], RN as Oncology Nurse Navigator ? ?DIAGNOSIS:  ?Encounter Diagnosis  ?Name Primary?  ? Ductal carcinoma in situ (DCIS) of right breast   ? ? ?SUMMARY OF ONCOLOGIC HISTORY: ?Oncology History  ?Ductal carcinoma in situ (DCIS) of right breast  ?01/25/2019 Initial Diagnosis  ? Screening mammogram showed bilateral calcifications right breast 5 mm, pleomorphic.  Left breast 1.6 cm punctate, smaller group of calcifications spanning 3 mm.  Right lower outer quadrant biopsy revealed low-grade DCIS ER 90%, PR 70%; biopsy of left breast calcifications ALH and PASH ?  ?01/31/2019 Genetic Testing  ? ATM c.8495G>A and RECQL4 c.2587G>A VUS identified on the 9-gene STAT panel and Multi-cancer panel through [facility].  The STAT Breast cancer panel offered by [facility] includes sequencing and rearrangement analysis for the following 9 genes:  ATM, BRCA1, BRCA2, CDH1, CHEK2, PALB2, PTEN, STK11 and TP53.   The Multi-Gene Panel offered by [facility] includes sequencing and/or deletion duplication testing of the following 84 genes: AIP, ALK, APC, ATM, AXIN2,BAP1,  BARD1, BLM, BMPR1A, BRCA1, BRCA2, BRIP1, CASR, CDC73, CDH1, CDK4, CDKN1B, CDKN1C, CDKN2A (p14ARF), CDKN2A (p16INK4a), CEBPA, CHEK2, CTNNA1, DICER1, DIS3L2, EGFR (c.2369C>T, p.Thr790Met variant only), EPCAM (Deletion/duplication testing only), FH, FLCN, GATA2, GPC3, GREM1 (Promoter region deletion/duplication testing only), HOXB13 (c.251G>A, p.Gly84Glu), HRAS, KIT, MAX, MEN1, MET, MITF (c.952G>A, p.Glu318Lys variant only), MLH1, MSH2, MSH3, MSH6, MUTYH, NBN, NF1, NF2, NTHL1, PALB2, PDGFRA, PHOX2B, PMS2, POLD1, POLE, POT1, PRKAR1A, PTCH1, PTEN, RAD50, RAD51C, RAD51D, RB1, RECQL4, RET, RUNX1, SDHAF2, SDHA (sequence changes only), SDHB, SDHC, SDHD, SMAD4, SMARCA4, SMARCB1, SMARCE1, STK11, SUFU, TERC, TERT, TMEM127, TP53, TSC1, TSC2, VHL, WRN and  WT1.  he report date is February 02, 2019. ?  ?02/11/2019 Surgery  ? RT lumpectomy: DICS Intermediate grade, Margins Neg, ER 90%, PR 70%; Lt Lumpectomy: UDH ?  ?02/26/2019 -  Anti-estrogen oral therapy  ? Tamoxifen $RemoveBef'20mg'ilXbOFtUzE$  daily, stopped because of depression and facial numbness ?  ?05/20/2019 - 06/17/2019 Radiation Therapy  ? Adjuvant XRT ?  ?08/15/2021 Relapse/Recurrence  ? MRI surveillance detected Right breast calcifications 5 cm, Ant biopsy: IG DCIS with necrosis, ER 40%, PR 0%, Posterior biopsy: Intermediate grade DCIS with ALH ?  ?AML (acute myeloid leukemia) ([place])  ?09/1998 Initial Diagnosis  ? AML (acute myeloid leukemia) ([place]) treated with 2 induction regimens followed by 6 consolidation regimens, remission ?  ? ? ?CHIEF COMPLIANT: Follow-up of right breast DCIS status postmastectomy at [facility] ? ?INTERVAL HISTORY: Joann Meadows is a 46 y.o. with above-mentioned history of right breast DCIS for which she underwent a lumpectomy.  She had MRI for surveillance which detected right breast calcifications that was 5 cm of DCIS.  Initial ER 40% PR 0%.  She underwent right mastectomy at [facility] which revealed high-grade DCIS with microinvasion that was ER 0%, PR 0%, HER2 positive. She presents to the clinic today for a follow-up.   ? ? ?ALLERGIES:  is allergic to tamoxifen citrate. ? ?MEDICATIONS:  ?Current Outpatient Medications  ?Medication Sig Dispense Refill  ? ibuprofen (ADVIL) 200 MG tablet Take 2 tablets by mouth every 12 (twelve) hours as needed.    ? Polyethyl Glycol-Propyl Glycol 0.4-0.3 % SOLN Apply 2 drops to eye in the morning, at noon, in the evening, and at bedtime.    ? tamoxifen (NOLVADEX) 10 MG tablet TAKE 1 TABLET BY MOUTH TWICE A DAY 30 tablet 3  ? triamcinolone (KENALOG) 0.025 %  ointment Apply 1 application topically 2 (two) times daily. 30 g 0  ? ?No current facility-administered medications for this visit.  ? ? ?PHYSICAL EXAMINATION: ?ECOG PERFORMANCE STATUS: 1 - Symptomatic but completely  ambulatory ? ?Vitals:  ? 02/14/22 1100  ?BP: (!) 141/86  ?Pulse: 78  ?Resp: 18  ?Temp: 97.7 ?F (36.5 ?C)  ?SpO2: 99%  ? ?Filed Weights  ? 02/14/22 1100  ?Weight: 170 lb 6.4 oz (77.3 kg)  ? ? ?BREAST: No palpable masses or nodules in either right or left breasts.  Right breast has been reconstructed.  No palpable axillary supraclavicular or infraclavicular adenopathy no breast tenderness or nipple discharge. (exam performed in the presence of a chaperone) ? ?LABORATORY DATA:  ?I have reviewed the data as listed ? ?  Latest Ref Rng & Units 10/27/2019  ?  3:28 PM 06/16/2019  ?  2:38 PM 06/15/2019  ?  3:35 PM  ?CMP  ?Glucose 70 - 99 mg/dL 96   117   111    ?BUN 6 - 20 mg/dL $Remove'19   14   15    'TwBseiv$ ?Creatinine 0.44 - 1.00 mg/dL 0.84   0.82   0.70    ?Sodium 135 - 145 mmol/L 141   142   143    ?Potassium 3.5 - 5.1 mmol/L 3.9   4.4   3.9    ?Chloride 98 - 111 mmol/L 107   109   109    ?CO2 22 - 32 mmol/L 25   24     ?Calcium 8.9 - 10.3 mg/dL 9.1   9.1     ?Total Protein 6.5 - 8.1 g/dL 7.2   6.9     ?Total Bilirubin 0.3 - 1.2 mg/dL 0.3   0.4     ?Alkaline Phos 38 - 126 U/L 59   45     ?AST 15 - 41 U/L 13   11     ?ALT 0 - 44 U/L 15   12     ? ? ?Lab Results  ?Component Value Date  ? WBC 6.7 10/27/2019  ? HGB 13.1 10/27/2019  ? HCT 40.2 10/27/2019  ? MCV 91.8 10/27/2019  ? PLT 220 10/27/2019  ? NEUTROABS 4.2 10/27/2019  ? ? ?ASSESSMENT & PLAN:  ?Ductal carcinoma in situ (DCIS) of right breast ?02/11/19: RT lumpectomy: DICS Intermediate grade, Margins Neg, ER 90%, PR 70%; Lt Lumpectomy: UDH status post radiation, could not tolerate tamoxifen ?recurrence: 08/15/2021:MRI surveillance detected Right breast calcifications 5 cm, Ant biopsy: IG DCIS with necrosis, ER 40%, PR 0%, Posterior biopsy: Intermediate grade DCIS with ALH  ?11/29/2021: Right mastectomy: High-grade DCIS with microinvasion, ER 0%, PR 0%, HER2 positive, 0/1 lymph node negative  ? ?Patient had second opinion at [place] with Dr. Harden Mo who agreed with her plan of no systemic  chemotherapy ? ?Breast cancer surveillance: Left breast mammogram and MRIs ?1.  Mammogram 08/21/2021: Benign breast density category C  ?2. breast MRI will be scheduled ? ?We will set her up with [facility] for blood testing for cell free DNA analysis ?Return to clinic in 6 months for follow-up ? ? ? ?Orders Placed This Encounter  ?Procedures  ? MM DIAG BREAST TOMO BILATERAL  ?  Standing Status:   Future  ?  Standing Expiration Date:   02/15/2023  ?  Order Specific Question:   Reason for Exam (SYMPTOM  OR DIAGNOSIS REQUIRED)  ?  Answer:   Annual mammogram with H/O breast cancer  ?  Order Specific Question:  Is the patient pregnant?  ?  Answer:   No  ?  Order Specific Question:   Preferred imaging location?  ?  Answer:   [facility]  ?  Order Specific Question:   Release to patient  ?  Answer:   Immediate  ? ?The patient has a good understanding of the overall plan. she agrees with it. she will call with any problems that may develop before the next visit here. ?Total time spent: 30 mins including face to face time and time spent for planning, charting and co-ordination of care ? ? Joann Ohara, MD ?02/14/22 ? ? ? I Joann Meadows am scribing for Dr. Lindi Meadows ? ?I have reviewed the above documentation for accuracy and completeness, and I agree with the above. ?. ?

## 2022-02-14 ENCOUNTER — Other Ambulatory Visit: Payer: Self-pay

## 2022-02-14 ENCOUNTER — Inpatient Hospital Stay: Payer: BC Managed Care – PPO | Attending: Hematology and Oncology | Admitting: Hematology and Oncology

## 2022-02-14 DIAGNOSIS — D0511 Intraductal carcinoma in situ of right breast: Secondary | ICD-10-CM | POA: Diagnosis not present

## 2022-02-14 DIAGNOSIS — C9201 Acute myeloblastic leukemia, in remission: Secondary | ICD-10-CM | POA: Diagnosis not present

## 2022-02-14 NOTE — Assessment & Plan Note (Signed)
02/11/19:?RT lumpectomy: DICS Intermediate grade, Margins Neg, ER 90%, PR 70%; Lt Lumpectomy: UDH status post radiation, could not tolerate tamoxifen ?recurrence: 08/15/2021:MRI surveillance detected Right breast calcifications 5 cm, Ant biopsy: IG DCIS with necrosis, ER 40%, PR 0%, Posterior biopsy:?Intermediate grade DCIS with?ALH? ?11/29/2021: Right mastectomy: High-grade DCIS with microinvasion, ER 0%, PR 0%, HER2 positive, 0/1 lymph node negative  ? ?Patient had second opinion at [place] with Dr. Harden Mo who agreed with her plan of no systemic chemotherapy ? ?Breast cancer surveillance: Left breast mammogram and MRIs ?1.  Mammogram 08/21/2021: Benign breast density category C  ?2. breast MRI will be scheduled ? ?Return to clinic in 1 year for follow-up ? ?

## 2022-02-15 ENCOUNTER — Telehealth: Payer: Self-pay | Admitting: Hematology and Oncology

## 2022-02-15 ENCOUNTER — Encounter: Payer: Self-pay | Admitting: *Deleted

## 2022-02-15 ENCOUNTER — Ambulatory Visit: Payer: BC Managed Care – PPO | Admitting: Rehabilitation

## 2022-02-15 ENCOUNTER — Encounter: Payer: Self-pay | Admitting: Rehabilitation

## 2022-02-15 DIAGNOSIS — Z483 Aftercare following surgery for neoplasm: Secondary | ICD-10-CM

## 2022-02-15 DIAGNOSIS — Z9189 Other specified personal risk factors, not elsewhere classified: Secondary | ICD-10-CM

## 2022-02-15 DIAGNOSIS — D0511 Intraductal carcinoma in situ of right breast: Secondary | ICD-10-CM | POA: Diagnosis not present

## 2022-02-15 DIAGNOSIS — R293 Abnormal posture: Secondary | ICD-10-CM | POA: Diagnosis not present

## 2022-02-15 DIAGNOSIS — M25611 Stiffness of right shoulder, not elsewhere classified: Secondary | ICD-10-CM

## 2022-02-15 DIAGNOSIS — M542 Cervicalgia: Secondary | ICD-10-CM | POA: Diagnosis not present

## 2022-02-15 NOTE — Progress Notes (Signed)
Per MD request, RN placed order for [place] home testing through the online portal.  ?

## 2022-02-15 NOTE — Therapy (Signed)
?OUTPATIENT PHYSICAL THERAPY ONCOLOGY TREATMENT ? ?Patient Name: Joann Meadows ?MRN: [id number] ?DOB:July 24, 1976, 46 y.o., female ?Today's Date: 02/15/2022 ? ? PT End of Session - 02/15/22 1103   ? ? Visit Number 3   ? Number of Visits 6   ? Date for PT Re-Evaluation 03/13/22   ? PT Start Time 1104   ? PT Stop Time 1150   ? PT Time Calculation (min) 46 min   ? Activity Tolerance Patient tolerated treatment well   ? Behavior During Therapy [facility] for tasks assessed/performed   ? ?  ?  ? ?  ? ? ?Past Medical History:  ?Diagnosis Date  ? Blood transfusion without reported diagnosis   ? leaukemia 18 years ago  ? Breast cancer ([place]) 2020  ? Right Breast Cancer  ? Endometriosis   ? Family history of colon cancer   ? Family history of leukemia   ? Fibroid   ? Hx of radiation therapy 06/2019  ? Leukemia ([place])   ? 20 years ago  ? Personal history of radiation therapy 2020  ? Right Breast Cancer  ? PONV (postoperative nausea and vomiting)   ? pt states she vomits after colonoscopies  ? ?Past Surgical History:  ?Procedure Laterality Date  ? BREAST EXCISIONAL BIOPSY Left 02/11/2019  ? [place]  ? BREAST LUMPECTOMY Right 02/11/2019  ? BREAST LUMPECTOMY WITH RADIOACTIVE SEED LOCALIZATION Bilateral 02/11/2019  ? Procedure: BILATERAL BREAST LUMPECTOMIES WITH BILATERAL RADIOACTIVE SEED LOCALIZATION;  Surgeon: Donnie Mesa, MD;  Location: [facility];  Service: General;  Laterality: Bilateral;  ? ?Patient Active Problem List  ? Diagnosis Date Noted  ? COPD possible, would be Gold Group B 11/25/2019  ? DOE (dyspnea on exertion) 11/24/2019  ? Genetic testing 02/02/2019  ? Ductal carcinoma in situ (DCIS) of right breast 01/25/2019  ? AML (acute myeloid leukemia) ([place]) 01/25/2019  ? Family history of colon cancer   ? Family history of leukemia   ? Delivery normal 03/25/2017  ? ? ?PCP: [name], L.Marlou Sa, MD ? ?REFERRING PROVIDER: Candace Gallus, PA ? ?REFERRING DIAG: Rt breast cancer ? ?THERAPY DIAG:  ?Ductal carcinoma in situ  (DCIS) of right breast ? ?Stiffness of right shoulder, not elsewhere classified ? ?Aftercare following surgery for neoplasm ? ?At risk for lymphedema ? ?ONSET DATE: 11/29/21 ? ?SUBJECTIVE                                                                                                                                                                                          ? ?SUBJECTIVE STATEMENT: ?I have a stress fracture in the foot and am in a boot x 2 weeks.  ?  The chest is kind of tight in the lateral side  ?  ? ?PERTINENT HISTORY:  ?Leukemia history, previous Rt breast lumpectomy and radiation in 2020. Rt mastectomy with expander placement and SLNB 0/1 LN positive on 11/29/21.   ? ?PAIN:  ?Are you having pain? Yes ?NPRS scale: 3/10 at rest, at the worst 8/10 in the evening and at night  ?Pain location: Rt axilla and anterior breast region ?Pain orientation: Right  ?PAIN TYPE: aching, burning, and sharp ?Pain description: intermittent and constant  ?Aggravating factors: at the end of the day ?Relieving factors: just OTC as needed  ? ?PRECAUTIONS: Other: lymphedema risk Rt UE ? 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  ? ?OBJECTIVE ? ?UPPER EXTREMITY AROM/PROM: ? ?A/PROM RIGHT  02/15/2022 ?  ?Shoulder extension 45  ?Shoulder flexion 150 - tight in axilla   ?Shoulder abduction 160- tight in axilla   ?Shoulder internal rotation 70  ?Shoulder external rotation 90  ?  (Blank rows = not tested) ? ?A/PROM LEFT  02/15/2022  ?Shoulder extension 30  ?Shoulder flexion 175  ?Shoulder abduction 164  ?Shoulder internal rotation   ?Shoulder external rotation 105  ?  (Blank rows = not tested) ? ?TODAY'S TREATMENT  ?02/15/22: ?Pulleys flexion x 85mn and abduction x 389m with initial cueing ?Wall ball flexion 5" x 3 ?PROM of Rt shoulder into flexion, abduction, ER, and D2 flexion and axillary release and latissimus MFR incorporated - no sig tightness noted today.  ?STM to pectoralis,  serratus, Latissimus, in supine and latisimus in sidelying also without significant tightness ?Noted increased lateral trunk puffiness so performed short neck, Rt axillary nodes, Rt inguinal nodes, Rt axilloinguinal anastamosis and then focus on redirecting lateral trunk edema to nodes.  Avoided any work over expander ?Gave pt prescription for compression bra and axillary pillow ? ?02/08/22 ?Pulleys flexion x 16m19mand abduction x 16mi56mith initial cueing - have handout on how to purchase ?Wall ball flexion 5" x 3 ?MT: PROM into flexion, abduction, ER, and D2 flexion and axillary release and latissimus MFR incorporated ?STM to pectoralis, serratus, Latissimus, in supine ? ? ?01/30/22 ?Instruction and performance of HEP per below x 2 each ?Education on lymphedema risk reduction and gave info for second to nature as pt is having trouble finding a comfortable bra.  ?PROM into flexion, abduction x 5 each with axillary blocking ? ?PATIENT EDUCATION:  ?Education details: lymphedema risk reduction and prevention as well as surveillance, POC, initial HEP ?Person educated: Patient ?Education method: Explanation, Demonstration, Verbal cues, and Handouts ?Education comprehension: verbalized understanding and returned demonstration ? ? ?HOME EXERCISE PROGRAM: ?Access Code: [email or web address]: [email or web address]: 03/08/2023Prepared by: KaraMarcene BrawnisExercises  ?Supine Shoulder Flexion Extension AAROM with Dowel - 1-2 x daily - 7 x weekly - 10 reps - 5 seconds hold  ?Supine Chest Stretch with Elbows Bent - 1 x daily - 7 x weekly - 1 sets - 3 reps - 30-60seconds hold  ?Standing Shoulder Abduction Slides at Wall - 1 x daily - 7 x weekly - 1 sets - 5 reps - 6 second hold ? ?ASSESSMENT: ?CLINICAL IMPRESSION: ?Excellent ROM today.  No significant tightness during PROM and STM today.  Some notable edema  lateral trunk - soft and non pitting improved with MLD and MT today.   ? ?GOALS: ?Goals reviewed with patient? Yes ? ?SHORT  TERM GOALS: ? ?Pt will be educated on lymphedema risk reduction practices and how  to watch for the initial signs of swelling.  ?Baseline:   ?Target date: 03/01/2022 ?Goal status: MET ? ? ?LONG TERM GOALS: ? ?Pt will improve UE flexion AROM to at least 170 to improve mobility ?Baseline: 150 ?Target date: 03/29/2022 ?Goal status: INITIAL ? ?2.  Pt will be ind with final HEP for continued strength and mobility  ?Baseline:  ?Target date: 03/29/2022 ?Goal status: INITIAL ? ?PLAN: ?PT FREQUENCY: 1x/week ? ?PT DURATION: 6 weeks ? ?PLANNED INTERVENTIONS: Therapeutic exercises, Patient/Family education, and Manual therapy ? ?PLAN FOR NEXT SESSION: Rt UE PROM with STM release, pulleys, ball, tell pt about ABC class, add supine scapular x 5 each yellow ?*Do sozo around last visit for baseline as pt has no subjective symptoms of swelling ? ? ?Stark Bray, PT ?02/15/2022, 11:52 AM  ?

## 2022-02-15 NOTE — Telephone Encounter (Signed)
Scheduled appointment per 3/24 los. Patient is aware. ?

## 2022-02-19 DIAGNOSIS — I10 Essential (primary) hypertension: Secondary | ICD-10-CM | POA: Diagnosis not present

## 2022-02-22 ENCOUNTER — Encounter: Payer: Self-pay | Admitting: Rehabilitation

## 2022-02-22 ENCOUNTER — Ambulatory Visit: Payer: BC Managed Care – PPO | Admitting: Rehabilitation

## 2022-02-22 DIAGNOSIS — M25611 Stiffness of right shoulder, not elsewhere classified: Secondary | ICD-10-CM

## 2022-02-22 DIAGNOSIS — C50911 Malignant neoplasm of unspecified site of right female breast: Secondary | ICD-10-CM | POA: Diagnosis not present

## 2022-02-22 DIAGNOSIS — M542 Cervicalgia: Secondary | ICD-10-CM

## 2022-02-22 DIAGNOSIS — Z9189 Other specified personal risk factors, not elsewhere classified: Secondary | ICD-10-CM

## 2022-02-22 DIAGNOSIS — D0511 Intraductal carcinoma in situ of right breast: Secondary | ICD-10-CM | POA: Diagnosis not present

## 2022-02-22 DIAGNOSIS — R293 Abnormal posture: Secondary | ICD-10-CM

## 2022-02-22 DIAGNOSIS — Z483 Aftercare following surgery for neoplasm: Secondary | ICD-10-CM

## 2022-02-22 NOTE — Therapy (Addendum)
?OUTPATIENT PHYSICAL THERAPY ONCOLOGY TREATMENT ? ?Patient Name: Joann Meadows ?MRN: [id number] ?DOB:Sep 15, 1976, 46 y.o., female ?Today's Date: 02/25/2022 ? ? ? ? ?Past Medical History:  ?Diagnosis Date  ? Blood transfusion without reported diagnosis   ? leaukemia 18 years ago  ? Breast cancer ([place]) 2020  ? Right Breast Cancer  ? Endometriosis   ? Family history of colon cancer   ? Family history of leukemia   ? Fibroid   ? Hx of radiation therapy 06/2019  ? Leukemia ([place])   ? 20 years ago  ? Personal history of radiation therapy 2020  ? Right Breast Cancer  ? PONV (postoperative nausea and vomiting)   ? pt states she vomits after colonoscopies  ? ?Past Surgical History:  ?Procedure Laterality Date  ? BREAST EXCISIONAL BIOPSY Left 02/11/2019  ? [place]  ? BREAST LUMPECTOMY Right 02/11/2019  ? BREAST LUMPECTOMY WITH RADIOACTIVE SEED LOCALIZATION Bilateral 02/11/2019  ? Procedure: BILATERAL BREAST LUMPECTOMIES WITH BILATERAL RADIOACTIVE SEED LOCALIZATION;  Surgeon: Donnie Mesa, MD;  Location: [facility];  Service: General;  Laterality: Bilateral;  ? ?Patient Active Problem List  ? Diagnosis Date Noted  ? COPD possible, would be Gold Group B 11/25/2019  ? DOE (dyspnea on exertion) 11/24/2019  ? Genetic testing 02/02/2019  ? Ductal carcinoma in situ (DCIS) of right breast 01/25/2019  ? AML (acute myeloid leukemia) ([place]) 01/25/2019  ? Family history of colon cancer   ? Family history of leukemia   ? Delivery normal 03/25/2017  ? ? ?PCP: [name], L.Marlou Sa, MD ? ?REFERRING PROVIDER: Candace Gallus, PA ? ?REFERRING DIAG: Rt breast cancer ? ?THERAPY DIAG:  ?Ductal carcinoma in situ (DCIS) of right breast ? ?Stiffness of right shoulder, not elsewhere classified ? ?At risk for lymphedema ? ?Aftercare following surgery for neoplasm ? ?ONSET DATE: 11/29/21 ? ?SUBJECTIVE                                                                                                                                                                                           ? ?SUBJECTIVE STATEMENT: ?I have an appt for second to nature.  The swelling came back after 2-3 days .  All done with fills. Do you know anyone I could see for my neck pain? ? ?PERTINENT HISTORY:  ?Leukemia history, previous Rt breast lumpectomy and radiation in 2020. Rt mastectomy with expander placement and SLNB 0/1 LN positive on 11/29/21.   ? ?PAIN:  ?Are you having pain? Yes ?NPRS scale: 4/10 at rest, at the worst 8/10 in the evening and at night  ?Pain location: Rt expander tightness ?Pain orientation: Right  ?PAIN TYPE: aching,  burning, and sharp ?Pain description: intermittent ?Aggravating factors: at the end of the day ?Relieving factors: just OTC as needed  ? ?PRECAUTIONS: Other: lymphedema risk Rt UE ? 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  ? ?OBJECTIVE ? ?UPPER EXTREMITY AROM/PROM: ? ?A/PROM RIGHT   ? 02/22/22  ?Shoulder extension 45 45  ?Shoulder flexion 150 - tight in axilla  165 - tight in axilla   ?Shoulder abduction 160- tight in axilla  165 - tight in pectoralis   ?Shoulder internal rotation 70   ?Shoulder external rotation 90   ?  (Blank rows = not tested) ? ?A/PROM LEFT   02/22/22  ?Shoulder extension 30   ?Shoulder flexion 175   ?Shoulder abduction 164   ?Shoulder internal rotation    ?Shoulder external rotation 105   ?  (Blank rows = not tested) ? ?TODAY'S TREATMENT  ?02/15/22: ?Pulleys flexion x 683mn and abduction x 234m ?Wall ball flexion 5" x 5 and abduction 5" x 5 ?Doorway stretch 3x20" Rt UE only  ?Dowel flexion easy today  ?Low trunk rotation 6" x 4 bil ?Pt mentioned neck pain with c/o chronic pain in the Rt>Lt upper trap and levator region moving up into the head and causing headaches and trouble feeling dizzy.  Pt did have pain reproduced with trigger point palpation to the Rt UT, levator, and cervical multifidi. Discussed self trigger point release, UT, and LS stretches ?PROM of Rt shoulder into  flexion, abduction, ER, and D2 flexion and axillary release and latissimus MFR incorporated  ?STM to pectoralis, serratus, Latissimus, in supine as well as Rt upper trap and into cervical and suboccipitals ?Continues with mild lateral trunk puffiness so performed short neck, Rt axillary nodes, Rt inguinal nodes, Rt axilloinguinal anastamosis and then focus on redirecting lateral trunk edema to nodes.  Avoided any work over expander ?Sent note to Dr. GuLindi Adieo add neck pain and dry needling  ? ?02/08/22 ?Pulleys flexion x 83m69mand abduction x 83mi9mith initial cueing - have handout on how to purchase ?Wall ball flexion 5" x 3 ?MT: PROM into flexion, abduction, ER, and D2 flexion and axillary release and latissimus MFR incorporated ?STM to pectoralis, serratus, Latissimus, in supine ? ?01/30/22 ?Instruction and performance of HEP per below x 2 each ?Education on lymphedema risk reduction and gave info for second to nature as pt is having trouble finding a comfortable bra.  ?PROM into flexion, abduction x 5 each with axillary blocking ? ?PATIENT EDUCATION:  ?Education details: lymphedema risk reduction and prevention as well as surveillance, POC, initial HEP ?Person educated: Patient ?Education method: Explanation, Demonstration, Verbal cues, and Handouts ?Education comprehension: verbalized understanding and returned demonstration ? ? ?HOME EXERCISE PROGRAM: ?Access Code: [email or web address]: [email or web address]: 03/08/2023Prepared by: KaraMarcene BrawnisExercises  ?Supine Shoulder Flexion Extension AAROM with Dowel - 1-2 x daily - 7 x weekly - 10 reps - 5 seconds hold  ?Supine Chest Stretch with Elbows Bent - 1 x daily - 7 x weekly - 1 sets - 3 reps - 30-60seconds hold  ?Standing Shoulder Abduction Slides at Wall - 1 x daily - 7 x weekly - 1 sets - 5 reps - 6 second hold ?Upper trap and levator stretch added 02/22/22 ? ?ASSESSMENT: ?CLINICAL IMPRESSION: ?More latissimus tension and trigger points today noted in lats  and cervical spine and discussion of neck pain which seems like trigger point/tension/chronic pain.  Got as response from Dr. GudeLindi Adie  add DN so an updated POC was sent 02/25/22.   ? ?GOALS: ?Goals reviewed with patient? Yes ? ?SHORT TERM GOALS: ? ?Pt will be educated on lymphedema risk reduction practices and how to watch for the initial signs of swelling.  ?Baseline:   ?Target date: 03/11/2022 ?Goal status: MET ? ? ?LONG TERM GOALS: ? ?Pt will improve UE flexion AROM to at least 170 to improve mobility ?Baseline: 150 ?Target date: 04/08/2022 ?Goal status: INITIAL ? ?2.  Pt will be ind with final HEP for continued strength and mobility  ?Baseline:  ?Target date: 04/08/2022 ?Goal status: INITIAL ? ?PLAN: ?PT FREQUENCY: 1-2x per week ? ?PT DURATION: 8 more weeks ? ?PLANNED INTERVENTIONS: Therapeutic exercises, Patient/Family education, and Manual therapy, Dry Needling, modalities as needed ? ?PLAN FOR NEXT SESSION: Rt UE PROM with STM release Rt upper quadrant and neck, pulleys, ball, , add supine scapular x 5 each yellow? ?*Do sozo around last visit for baseline as pt has no subjective symptoms of swelling ? ? ?Stark Bray, PT ?02/25/2022, 12:13 PM  ?

## 2022-02-25 ENCOUNTER — Encounter: Payer: BC Managed Care – PPO | Admitting: Rehabilitation

## 2022-02-25 NOTE — Addendum Note (Signed)
Addended by: Shan Levans R on: 02/25/2022 12:17 PM ? ? Modules accepted: Orders ? ?

## 2022-02-26 ENCOUNTER — Telehealth: Payer: Self-pay

## 2022-02-26 DIAGNOSIS — S92344D Nondisplaced fracture of fourth metatarsal bone, right foot, subsequent encounter for fracture with routine healing: Secondary | ICD-10-CM | POA: Diagnosis not present

## 2022-02-26 DIAGNOSIS — E559 Vitamin D deficiency, unspecified: Secondary | ICD-10-CM | POA: Diagnosis not present

## 2022-02-26 NOTE — Telephone Encounter (Signed)
Incoming call from pt, we are not able to continue with [employer] testing since pt is DCIS - [facility] testing is for invasive solid tumor cancers.  Pt states she is anxious and would like any and all testing possible - I advised pt we will continue surveillance, medication, and to report changes she notices.  Pt expressed understanding and thanks   ?

## 2022-02-27 ENCOUNTER — Ambulatory Visit: Payer: BC Managed Care – PPO

## 2022-03-11 DIAGNOSIS — R102 Pelvic and perineal pain: Secondary | ICD-10-CM | POA: Diagnosis not present

## 2022-03-15 ENCOUNTER — Ambulatory Visit: Payer: BC Managed Care – PPO | Attending: Physician Assistant

## 2022-03-15 DIAGNOSIS — Z9189 Other specified personal risk factors, not elsewhere classified: Secondary | ICD-10-CM | POA: Insufficient documentation

## 2022-03-15 DIAGNOSIS — Z483 Aftercare following surgery for neoplasm: Secondary | ICD-10-CM | POA: Diagnosis not present

## 2022-03-15 DIAGNOSIS — D0511 Intraductal carcinoma in situ of right breast: Secondary | ICD-10-CM | POA: Diagnosis not present

## 2022-03-15 DIAGNOSIS — M542 Cervicalgia: Secondary | ICD-10-CM | POA: Insufficient documentation

## 2022-03-15 DIAGNOSIS — M25611 Stiffness of right shoulder, not elsewhere classified: Secondary | ICD-10-CM | POA: Diagnosis not present

## 2022-03-15 DIAGNOSIS — R293 Abnormal posture: Secondary | ICD-10-CM | POA: Insufficient documentation

## 2022-03-15 NOTE — Therapy (Signed)
?OUTPATIENT PHYSICAL THERAPY ONCOLOGY TREATMENT ? ?Patient Name: Joann Meadows ?MRN: [id number] ?DOB:1976-08-26, 46 y.o., female ?Today's Date: 03/15/2022 ? ? PT End of Session - 03/15/22 1116   ? ? Visit Number 6   ? Number of Visits 13   ? Date for PT Re-Evaluation 04/19/22   ? PT Start Time 1108   pt arrived late  ? PT Stop Time 1771   ? PT Time Calculation (min) 49 min   ? Activity Tolerance Patient tolerated treatment well   ? Behavior During Therapy [facility] for tasks assessed/performed   ? ?  ?  ? ?  ? ? ? ?Past Medical History:  ?Diagnosis Date  ? Blood transfusion without reported diagnosis   ? leaukemia 18 years ago  ? Breast cancer ([place]) 2020  ? Right Breast Cancer  ? Endometriosis   ? Family history of colon cancer   ? Family history of leukemia   ? Fibroid   ? Hx of radiation therapy 06/2019  ? Leukemia ([place])   ? 20 years ago  ? Personal history of radiation therapy 2020  ? Right Breast Cancer  ? PONV (postoperative nausea and vomiting)   ? pt states she vomits after colonoscopies  ? ?Past Surgical History:  ?Procedure Laterality Date  ? BREAST EXCISIONAL BIOPSY Left 02/11/2019  ? [place]  ? BREAST LUMPECTOMY Right 02/11/2019  ? BREAST LUMPECTOMY WITH RADIOACTIVE SEED LOCALIZATION Bilateral 02/11/2019  ? Procedure: BILATERAL BREAST LUMPECTOMIES WITH BILATERAL RADIOACTIVE SEED LOCALIZATION;  Surgeon: Donnie Mesa, MD;  Location: [place];  Service: General;  Laterality: Bilateral;  ? ?Patient Active Problem List  ? Diagnosis Date Noted  ? COPD possible, would be Gold Group B 11/25/2019  ? DOE (dyspnea on exertion) 11/24/2019  ? Genetic testing 02/02/2019  ? Ductal carcinoma in situ (DCIS) of right breast 01/25/2019  ? AML (acute myeloid leukemia) ([place]) 01/25/2019  ? Family history of colon cancer   ? Family history of leukemia   ? Delivery normal 03/25/2017  ? ? ?PCP: [name], L.Marlou Sa, MD ? ?REFERRING PROVIDER: Candace Gallus, PA ? ?REFERRING DIAG: Rt breast cancer ? ?THERAPY DIAG:  ?Ductal  carcinoma in situ (DCIS) of right breast ? ?Stiffness of right shoulder, not elsewhere classified ? ?At risk for lymphedema ? ?Aftercare following surgery for neoplasm ? ?Abnormal posture ? ?ONSET DATE: 11/29/21 ? ?SUBJECTIVE                                                                                                                                                                                          ? ?SUBJECTIVE STATEMENT: ?The compression bra has really helped my swelling  at the side of my trunk.  ? ?PERTINENT HISTORY:  ?Leukemia history, previous Rt breast lumpectomy and radiation in 2020. Rt mastectomy with expander placement and SLNB 0/1 LN positive on 11/29/21.   ? ?PAIN:  ?Are you having pain? No ? ?PRECAUTIONS: Other: lymphedema risk Rt UE ? 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  ? ?OBJECTIVE ? ?UPPER EXTREMITY AROM/PROM: ? ?A/PROM RIGHT   ? 02/22/22  ?Shoulder extension 45 45  ?Shoulder flexion 150 - tight in axilla  165 - tight in axilla   ?Shoulder abduction 160- tight in axilla  165 - tight in pectoralis   ?Shoulder internal rotation 70   ?Shoulder external rotation 90   ?  (Blank rows = not tested) ? ?A/PROM LEFT   02/22/22  ?Shoulder extension 30   ?Shoulder flexion 175   ?Shoulder abduction 164   ?Shoulder internal rotation    ?Shoulder external rotation 105   ?  (Blank rows = not tested) ? ?TODAY'S TREATMENT  ?03/15/22: ?Therapeutic Exs:  ?Supine scapular series with yellow theraband x8 each returning therapist demo and handout issued ?Manual therapy: ?PROM: In supine to Rt shoulder into flexion, abduction, ER, and D2 flexion and axillary release and latissimus MFR incorporated; then cervical P/ROM briefly towards end of session into bil side bend and rotation, to Lt>Rt ?MLD: short neck, Rt inguinal nodes, Rt axilloinguinal anastamosis and then focus on redirecting lateral trunk edema to nodes throughout all MT;  Avoided any work over  expander ?02/15/22: ?Pulleys flexion x 51mn and abduction x 256m ?Wall ball flexion 5" x 5 and abduction 5" x 5 ?Doorway stretch 3x20" Rt UE only  ?Dowel flexion easy today  ?Low trunk rotation 6" x 4 bil ?Pt mentioned neck pain with c/o chronic pain in the Rt>Lt upper trap and levator region moving up into the head and causing headaches and trouble feeling dizzy.  Pt did have pain reproduced with trigger point palpation to the Rt UT, levator, and cervical multifidi. Discussed self trigger point release, UT, and LS stretches ?PROM of Rt shoulder into flexion, abduction, ER, and D2 flexion and axillary release and latissimus MFR incorporated  ?STM to pectoralis, serratus, Latissimus, in supine as well as Rt upper trap and into cervical and suboccipitals ?Continues with mild lateral trunk puffiness so performed short neck, Rt axillary nodes, Rt inguinal nodes, Rt axilloinguinal anastamosis and then focus on redirecting lateral trunk edema to nodes.  Avoided any work over expander ?Sent note to Dr. GuLindi Adieo add neck pain and dry needling  ? ?02/08/22 ?Pulleys flexion x 81m87mand abduction x 81mi68mith initial cueing - have handout on how to purchase ?Wall ball flexion 5" x 3 ?MT: PROM into flexion, abduction, ER, and D2 flexion and axillary release and latissimus MFR incorporated ?STM to pectoralis, serratus, Latissimus, in supine ? ? ?PATIENT EDUCATION:  ?Education details: Supine scapular series with yellow theraband ?Person educated: Patient ?Education method: Explanation, Demonstration, Verbal cues, and Handouts ?Education comprehension: verbalized understanding and returned demonstration ? ? ?HOME EXERCISE PROGRAM: ?Access Code: [email or web address]: [email or web address]: 03/08/2023Prepared by: KaraMarcene BrawnisExercises  ?Supine Shoulder Flexion Extension AAROM with Dowel - 1-2 x daily - 7 x weekly - 10 reps - 5 seconds hold  ?Supine Chest Stretch with Elbows Bent - 1 x daily - 7 x weekly - 1 sets - 3 reps -  30-60seconds hold  ?Standing Shoulder Abduction Slides at Wall - 1 x daily -  7 x weekly - 1 sets - 5 reps - 6 second hold ?Upper trap and levator stretch added 02/22/22 ? ?ASSESSMENT: ?CLINICAL IMPRESSION: ?Progressed pt today to include supine scapular series which she tolerated very well. Some tightness reported at end motions but tolerated without pain. Then continued manual therapy working to [place] upper quadrant tightness. Overall pt reports feeling tightness, though still present, has improved since start of care and she reports feeling looser by end of session.  ? ?GOALS: ?Goals reviewed with patient? Yes ? ?SHORT TERM GOALS: ? ?Pt will be educated on lymphedema risk reduction practices and how to watch for the initial signs of swelling.  ?Baseline:   ?Target date: 03/29/2022 ?Goal status: MET ? ? ?LONG TERM GOALS: ? ?Pt will improve UE flexion AROM to at least 170 to improve mobility ?Baseline: 150 ?Target date: 04/26/2022 ?Goal status: INITIAL ? ?2.  Pt will be ind with final HEP for continued strength and mobility  ?Baseline:  ?Target date: 04/26/2022 ?Goal status: INITIAL ? ?PLAN: ?PT FREQUENCY: 1-2x per week ? ?PT DURATION: 8 more weeks ? ?PLANNED INTERVENTIONS: Therapeutic exercises, Patient/Family education, and Manual therapy, Dry Needling, modalities as needed ? ?PLAN FOR NEXT SESSION: Rt UE PROM with STM release Rt upper quadrant and neck, pulleys, ball, , review supine scapular x 5 each yellow prn ?*Do sozo around last visit for baseline as pt has no subjective symptoms of swelling ? ? ?Otelia Limes, PTA ?03/15/2022, 12:03 PM  ?

## 2022-03-15 NOTE — Patient Instructions (Signed)
Over Head Pull: Narrow and Wide Grip   Cancer Rehab [phone]   On back, knees bent, feet flat, band across thighs, elbows straight but relaxed. Pull hands apart (start). Keeping elbows straight, bring arms up and over head, hands toward floor. Keep pull steady on band. Hold momentarily. Return slowly, keeping pull steady, back to start. Then do same with a wider grip on the band (past shoulder width) Repeat _5-10__ times. Band color __yellow____   Side Pull: Double Arm   On back, knees bent, feet flat. Arms perpendicular to body, shoulder level, elbows straight but relaxed. Pull arms out to sides, elbows straight. Resistance band comes across collarbones, hands toward floor. Hold momentarily. Slowly return to starting position. Repeat _5-10__ times. Band color _yellow____   Sword   On back, knees bent, feet flat, left hand on left hip, right hand above left. Pull right arm DIAGONALLY (hip to shoulder) across chest. Bring right arm along head toward floor. Hold momentarily. Slowly return to starting position. Repeat _5-10__ times. Do with left arm. Band color _yellow_____   Shoulder Rotation: Double Arm   On back, knees bent, feet flat, elbows tucked at sides, bent 90, hands palms up. Pull hands apart and down toward floor, keeping elbows near sides. Hold momentarily. Slowly return to starting position. Repeat _5-10__ times. Band color __yellow____    

## 2022-03-22 ENCOUNTER — Ambulatory Visit: Payer: BC Managed Care – PPO | Admitting: Rehabilitation

## 2022-03-22 ENCOUNTER — Encounter: Payer: Self-pay | Admitting: Rehabilitation

## 2022-03-22 DIAGNOSIS — M542 Cervicalgia: Secondary | ICD-10-CM

## 2022-03-22 DIAGNOSIS — M25611 Stiffness of right shoulder, not elsewhere classified: Secondary | ICD-10-CM | POA: Diagnosis not present

## 2022-03-22 DIAGNOSIS — I10 Essential (primary) hypertension: Secondary | ICD-10-CM | POA: Diagnosis not present

## 2022-03-22 DIAGNOSIS — R5383 Other fatigue: Secondary | ICD-10-CM | POA: Diagnosis not present

## 2022-03-22 DIAGNOSIS — Z483 Aftercare following surgery for neoplasm: Secondary | ICD-10-CM | POA: Diagnosis not present

## 2022-03-22 DIAGNOSIS — Z9189 Other specified personal risk factors, not elsewhere classified: Secondary | ICD-10-CM | POA: Diagnosis not present

## 2022-03-22 DIAGNOSIS — D0511 Intraductal carcinoma in situ of right breast: Secondary | ICD-10-CM

## 2022-03-22 DIAGNOSIS — R293 Abnormal posture: Secondary | ICD-10-CM

## 2022-03-22 DIAGNOSIS — R42 Dizziness and giddiness: Secondary | ICD-10-CM | POA: Diagnosis not present

## 2022-03-22 NOTE — Therapy (Signed)
?OUTPATIENT PHYSICAL THERAPY ONCOLOGY TREATMENT ? ?Patient Name: Joann Meadows ?MRN: [id number] ?DOB:1976-07-21, 46 y.o., female ?Today's Date: 03/22/2022 ? ? PT End of Session - 03/22/22 0756   ? ? Visit Number 7   ? Number of Visits 13   ? PT Start Time 0800   ? PT Stop Time 5885   ? PT Time Calculation (min) 46 min   ? Activity Tolerance Patient tolerated treatment well   ? Behavior During Therapy [facility] for tasks assessed/performed   ? ?  ?  ? ?  ? ? ? ?Past Medical History:  ?Diagnosis Date  ? Blood transfusion without reported diagnosis   ? leaukemia 18 years ago  ? Breast cancer ([place]) 2020  ? Right Breast Cancer  ? Endometriosis   ? Family history of colon cancer   ? Family history of leukemia   ? Fibroid   ? Hx of radiation therapy 06/2019  ? Leukemia ([place])   ? 20 years ago  ? Personal history of radiation therapy 2020  ? Right Breast Cancer  ? PONV (postoperative nausea and vomiting)   ? pt states she vomits after colonoscopies  ? ?Past Surgical History:  ?Procedure Laterality Date  ? BREAST EXCISIONAL BIOPSY Left 02/11/2019  ? [place]  ? BREAST LUMPECTOMY Right 02/11/2019  ? BREAST LUMPECTOMY WITH RADIOACTIVE SEED LOCALIZATION Bilateral 02/11/2019  ? Procedure: BILATERAL BREAST LUMPECTOMIES WITH BILATERAL RADIOACTIVE SEED LOCALIZATION;  Surgeon: Donnie Mesa, MD;  Location: [place];  Service: General;  Laterality: Bilateral;  ? ?Patient Active Problem List  ? Diagnosis Date Noted  ? COPD possible, would be Gold Group B 11/25/2019  ? DOE (dyspnea on exertion) 11/24/2019  ? Genetic testing 02/02/2019  ? Ductal carcinoma in situ (DCIS) of right breast 01/25/2019  ? AML (acute myeloid leukemia) ([place]) 01/25/2019  ? Family history of colon cancer   ? Family history of leukemia   ? Delivery normal 03/25/2017  ? ? ?PCP: [name], L.Marlou Sa, MD ? ?REFERRING PROVIDER: Candace Gallus, PA ? ?REFERRING DIAG: Rt breast cancer ? ?THERAPY DIAG:  ?Ductal carcinoma in situ (DCIS) of right breast ? ?Stiffness of  right shoulder, not elsewhere classified ? ?At risk for lymphedema ? ?Aftercare following surgery for neoplasm ? ?Cervicalgia ? ?Abnormal posture ? ?ONSET DATE: 11/29/21 ? ?SUBJECTIVE                                                                                                                                                                                          ? ?SUBJECTIVE STATEMENT: ?The compression bra makes me feel like I can't breathe, can I not wear it sometimes?  ? ?  PERTINENT HISTORY:  ?Leukemia history, previous Rt breast lumpectomy and radiation in 2020. Rt mastectomy with expander placement and SLNB 0/1 LN positive on 11/29/21.   ? ?PAIN:  ?Are you having pain? No ? ?PRECAUTIONS: Other: lymphedema risk Rt UE ? 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  ? ?OBJECTIVE ? ?UPPER EXTREMITY AROM/PROM: ? ?A/PROM RIGHT   ? 02/22/22 03/22/22  ?Shoulder extension 45 45 50  ?Shoulder flexion 150 - tight in axilla  165 - tight in axilla  167 - pull in axilla   ?Shoulder abduction 160- tight in axilla  165 - tight in pectoralis  167 - pull in axilla  ?Shoulder internal rotation 70    ?Shoulder external rotation 90    ?  (Blank rows = not tested) ? ?A/PROM LEFT   02/22/22  ?Shoulder extension 30   ?Shoulder flexion 175   ?Shoulder abduction 164   ?Shoulder internal rotation    ?Shoulder external rotation 105   ?  (Blank rows = not tested) ? ?TODAY'S TREATMENT  ?03/22/22 ?Therapeutic Exs:  ?Pulleys x 28mn flexion and abduction  ?Supine scapular series with yellow theraband x10 each returning therapist demo and handout issued ?Manual therapy: ?PROM: In supine to Rt shoulder into flexion, abduction, ER, and D2 flexion and axillary release and latissimus MFR incorporated; then cervical P/ROM briefly towards end of session into bil side bend and rotation, to Lt>Rt ?MLD: short neck, Rt inguinal nodes, Rt axilloinguinal anastamosis and then focus on redirecting lateral trunk  edema to nodes throughout all MT;  Avoided any work over expander ? ? ?03/15/22: ?Therapeutic Exs:  ?Supine scapular series with yellow theraband x8 each returning therapist demo and handout issued ?Manual therapy: ?PROM: In supine to Rt shoulder into flexion, abduction, ER, and D2 flexion and axillary release and latissimus MFR incorporated; then cervical P/ROM briefly towards end of session into bil side bend and rotation, to Lt>Rt ?MLD: short neck, Rt inguinal nodes, Rt axilloinguinal anastamosis and then focus on redirecting lateral trunk edema to nodes throughout all MT;  Avoided any work over expander ?02/15/22: ?Pulleys flexion x 261m and abduction x 80m45m?Wall ball flexion 5" x 5 and abduction 5" x 5 ?Doorway stretch 3x20" Rt UE only  ?Dowel flexion easy today  ?Low trunk rotation 6" x 4 bil ?Pt mentioned neck pain with c/o chronic pain in the Rt>Lt upper trap and levator region moving up into the head and causing headaches and trouble feeling dizzy.  Pt did have pain reproduced with trigger point palpation to the Rt UT, levator, and cervical multifidi. Discussed self trigger point release, UT, and LS stretches ?PROM of Rt shoulder into flexion, abduction, ER, and D2 flexion and axillary release and latissimus MFR incorporated  ?STM to pectoralis, serratus, Latissimus, in supine as well as Rt upper trap and into cervical and suboccipitals ?Continues with mild lateral trunk puffiness so performed short neck, Rt axillary nodes, Rt inguinal nodes, Rt axilloinguinal anastamosis and then focus on redirecting lateral trunk edema to nodes.  Avoided any work over expander ?Sent note to Dr. GudLindi Adie add neck pain and dry needling  ? ?02/08/22 ?Pulleys flexion x 3mi11mnd abduction x 3min80mth initial cueing - have handout on how to purchase ?Wall ball flexion 5" x 3 ?MT: PROM into flexion, abduction, ER, and D2 flexion and axillary release and latissimus MFR incorporated ?STM to pectoralis, serratus, Latissimus, in  supine ? ? ?PATIENT  EDUCATION:  ?Education details: Supine scapular series with yellow theraband ?Person educated: Patient ?Education method: Explanation, Demonstration, Verbal cues, and Handouts ?Education comprehension: verbalized understanding and returned demonstration ? ? ?HOME EXERCISE PROGRAM: ?Access Code: [email or web address] ?URL: [email or web address]: 03/08/2023Prepared by: Marcene Brawn TevisExercises  ?Supine Shoulder Flexion Extension AAROM with Dowel - 1-2 x daily - 7 x weekly - 10 reps - 5 seconds hold  ?Supine Chest Stretch with Elbows Bent - 1 x daily - 7 x weekly - 1 sets - 3 reps - 30-60seconds hold  ?Standing Shoulder Abduction Slides at Wall - 1 x daily - 7 x weekly - 1 sets - 5 reps - 6 second hold ?Upper trap and levator stretch added 02/22/22 ? ?ASSESSMENT: ?CLINICAL IMPRESSION: ?Pt seems to have reached a plateau in motion with continued axilla stiffness until implant exchange.  Pt leaves on a cruise tomorrow but still may be interested in dry needling when returning.   ? ?GOALS: ?Goals reviewed with patient? Yes ? ?SHORT TERM GOALS: ? ?Pt will be educated on lymphedema risk reduction practices and how to watch for the initial signs of swelling.  ?Baseline:   ?Target date: 04/05/2022 ?Goal status: MET ? ? ?LONG TERM GOALS: ? ?Pt will improve UE flexion AROM to at least 170 to improve mobility ?Baseline: 150 ?Target date: 05/03/2022 ?Goal status: INITIAL ? ?2.  Pt will be ind with final HEP for continued strength and mobility  ?Baseline:  ?Target date: 05/03/2022 ?Goal status: INITIAL ? ?PLAN: ?PT FREQUENCY: 1-2x per week ? ?PT DURATION: 8 more weeks ? ?PLANNED INTERVENTIONS: Therapeutic exercises, Patient/Family education, and Manual therapy, Dry Needling, modalities as needed ? ?PLAN FOR NEXT SESSION: Rt UE PROM with STM release Rt upper quadrant and neck, pulleys, ball, , review supine scapular x 5 each yellow prn ?*Do sozo around last visit for baseline as pt has no subjective symptoms of  swelling ? ? ?Stark Bray, PT ?03/22/2022, 8:46 AM  ?

## 2022-04-03 DIAGNOSIS — I10 Essential (primary) hypertension: Secondary | ICD-10-CM | POA: Diagnosis not present

## 2022-04-05 ENCOUNTER — Encounter: Payer: Self-pay | Admitting: Rehabilitation

## 2022-04-05 ENCOUNTER — Ambulatory Visit: Payer: BC Managed Care – PPO | Attending: Physician Assistant | Admitting: Rehabilitation

## 2022-04-05 DIAGNOSIS — Z483 Aftercare following surgery for neoplasm: Secondary | ICD-10-CM | POA: Diagnosis not present

## 2022-04-05 DIAGNOSIS — M25611 Stiffness of right shoulder, not elsewhere classified: Secondary | ICD-10-CM | POA: Insufficient documentation

## 2022-04-05 DIAGNOSIS — M542 Cervicalgia: Secondary | ICD-10-CM | POA: Insufficient documentation

## 2022-04-05 DIAGNOSIS — Z9189 Other specified personal risk factors, not elsewhere classified: Secondary | ICD-10-CM | POA: Insufficient documentation

## 2022-04-05 DIAGNOSIS — D0511 Intraductal carcinoma in situ of right breast: Secondary | ICD-10-CM | POA: Insufficient documentation

## 2022-04-05 DIAGNOSIS — R293 Abnormal posture: Secondary | ICD-10-CM | POA: Diagnosis not present

## 2022-04-05 NOTE — Therapy (Signed)
?OUTPATIENT PHYSICAL THERAPY ONCOLOGY TREATMENT ? ?Patient Name: Joann Meadows ?MRN: [id number] ?DOB:1976-02-09, 46 y.o., female ?Today's Date: 04/05/2022 ? ? PT End of Session - 04/05/22 0901   ? ? Visit Number 8   ? Number of Visits 13   ? Date for PT Re-Evaluation 04/19/22   ? PT Start Time [phone]   ? PT Stop Time 0949   ? PT Time Calculation (min) 46 min   ? Activity Tolerance Patient tolerated treatment well   ? Behavior During Therapy [facility] for tasks assessed/performed   ? ?  ?  ? ?  ? ? ? ?Past Medical History:  ?Diagnosis Date  ? Blood transfusion without reported diagnosis   ? leaukemia 18 years ago  ? Breast cancer ([place]) 2020  ? Right Breast Cancer  ? Endometriosis   ? Family history of colon cancer   ? Family history of leukemia   ? Fibroid   ? Hx of radiation therapy 06/2019  ? Leukemia ([place])   ? 20 years ago  ? Personal history of radiation therapy 2020  ? Right Breast Cancer  ? PONV (postoperative nausea and vomiting)   ? pt states she vomits after colonoscopies  ? ?Past Surgical History:  ?Procedure Laterality Date  ? BREAST EXCISIONAL BIOPSY Left 02/11/2019  ? [place]  ? BREAST LUMPECTOMY Right 02/11/2019  ? BREAST LUMPECTOMY WITH RADIOACTIVE SEED LOCALIZATION Bilateral 02/11/2019  ? Procedure: BILATERAL BREAST LUMPECTOMIES WITH BILATERAL RADIOACTIVE SEED LOCALIZATION;  Surgeon: Donnie Mesa, MD;  Location: [place];  Service: General;  Laterality: Bilateral;  ? ?Patient Active Problem List  ? Diagnosis Date Noted  ? COPD possible, would be Gold Group B 11/25/2019  ? DOE (dyspnea on exertion) 11/24/2019  ? Genetic testing 02/02/2019  ? Ductal carcinoma in situ (DCIS) of right breast 01/25/2019  ? AML (acute myeloid leukemia) ([place]) 01/25/2019  ? Family history of colon cancer   ? Family history of leukemia   ? Delivery normal 03/25/2017  ? ? ?PCP: [name], L.Marlou Sa, MD ? ?REFERRING PROVIDER: Candace Gallus, PA ? ?REFERRING DIAG: Rt breast cancer ? ?THERAPY DIAG:  ?Ductal carcinoma in situ  (DCIS) of right breast ? ?Stiffness of right shoulder, not elsewhere classified ? ?At risk for lymphedema ? ?Aftercare following surgery for neoplasm ? ?Cervicalgia ? ?Abnormal posture ? ?ONSET DATE: 11/29/21 ? ?SUBJECTIVE                                                                                                                                                                                          ? ?SUBJECTIVE STATEMENT: ?Better but still tight - when I don't wear the  compression bra it is worse.  I am going to [place]  ? ?PERTINENT HISTORY:  ?Leukemia history, previous Rt breast lumpectomy and radiation in 2020. Rt mastectomy with expander placement and SLNB 0/1 LN positive on 11/29/21.   ? ?PAIN:  ?Are you having pain? No ? ?PRECAUTIONS: Other: lymphedema risk Rt UE ? 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  ? ?OBJECTIVE ? ?UPPER EXTREMITY AROM/PROM: ? ?A/PROM RIGHT   ? 02/22/22 03/22/22 04/05/22  ?Shoulder extension 45 45 50   ?Shoulder flexion 150 - tight in axilla  165 - tight in axilla  167 - pull in axilla  167- pull in the axilla   ?Shoulder abduction 160- tight in axilla  165 - tight in pectoralis  167 - pull in axilla 167 - pull in lat not axilla   ?Shoulder internal rotation 70     ?Shoulder external rotation 90     ?  (Blank rows = not tested) ? ?A/PROM LEFT   02/22/22  ?Shoulder extension 30   ?Shoulder flexion 175   ?Shoulder abduction 164   ?Shoulder internal rotation    ?Shoulder external rotation 105   ?  (Blank rows = not tested) ? ?TODAY'S TREATMENT  ?04/05/22 ?Therapeutic Exs:  ?Pulleys x 15min flexion and abduction ?Ball rolls flexion x 10  ?Supine scapular series with yellow theraband x10 each returning therapist demo and handout issued ?Manual therapy: ?PROM: In supine to Rt shoulder into flexion, abduction, ER, and D2 flexion and axillary release and latissimus MFR incorporated; then cervical P/ROM briefly towards end of session into bil  side bend and rotation, to Lt>Rt ?MLD: short neck, Rt inguinal nodes, Rt axilloinguinal anastamosis and then focus on redirecting lateral trunk edema to nodes throughout all MT;  Avoided any work over expander ? ? ?03/22/22 ?Therapeutic Exs:  ?Pulleys x 62min flexion and abduction  ?Supine scapular series with yellow theraband x10 each returning therapist demo and handout issued ?Manual therapy: ?PROM: In supine to Rt shoulder into flexion, abduction, ER, and D2 flexion and axillary release and latissimus MFR incorporated; then cervical P/ROM briefly towards end of session into bil side bend and rotation, to Lt>Rt ?MLD: short neck, Rt inguinal nodes, Rt axilloinguinal anastamosis and then focus on redirecting lateral trunk edema to nodes throughout all MT;  Avoided any work over expander ? ? ?03/15/22: ?Therapeutic Exs:  ?Supine scapular series with yellow theraband x8 each returning therapist demo and handout issued ?Manual therapy: ?PROM: In supine to Rt shoulder into flexion, abduction, ER, and D2 flexion and axillary release and latissimus MFR incorporated; then cervical P/ROM briefly towards end of session into bil side bend and rotation, to Lt>Rt ?MLD: short neck, Rt inguinal nodes, Rt axilloinguinal anastamosis and then focus on redirecting lateral trunk edema to nodes throughout all MT;  Avoided any work over expander ?02/15/22: ?Pulleys flexion x 17min and abduction x 28min ?Wall ball flexion 5" x 5 and abduction 5" x 5 ?Doorway stretch 3x20" Rt UE only  ?Dowel flexion easy today  ?Low trunk rotation 6" x 4 bil ?Pt mentioned neck pain with c/o chronic pain in the Rt>Lt upper trap and levator region moving up into the head and causing headaches and trouble feeling dizzy.  Pt did have pain reproduced with trigger point palpation to the Rt UT, levator, and cervical multifidi. Discussed self trigger point release, UT, and LS stretches ?PROM of Rt shoulder into flexion, abduction, ER, and  D2 flexion and axillary  release and latissimus MFR incorporated  ?STM to pectoralis, serratus, Latissimus, in supine as well as Rt upper trap and into cervical and suboccipitals ?Continues with mild lateral trunk puffiness so performed short neck, Rt axillary nodes, Rt inguinal nodes, Rt axilloinguinal anastamosis and then focus on redirecting lateral trunk edema to nodes.  Avoided any work over expander ?Sent note to Dr. Lindi Adie to add neck pain and dry needling  ? ?02/08/22 ?Pulleys flexion x 60min and abduction x 41min with initial cueing - have handout on how to purchase ?Wall ball flexion 5" x 3 ?MT: PROM into flexion, abduction, ER, and D2 flexion and axillary release and latissimus MFR incorporated ?STM to pectoralis, serratus, Latissimus, in supine ? ? ?PATIENT EDUCATION:  ?Education details: Supine scapular series with yellow theraband ?Person educated: Patient ?Education method: Explanation, Demonstration, Verbal cues, and Handouts ?Education comprehension: verbalized understanding and returned demonstration ? ? ?HOME EXERCISE PROGRAM: ?Access Code: [email or web address] ?URL: [email or web address]: 03/08/2023Prepared by: Marcene Brawn TevisExercises  ?Supine Shoulder Flexion Extension AAROM with Dowel - 1-2 x daily - 7 x weekly - 10 reps - 5 seconds hold  ?Supine Chest Stretch with Elbows Bent - 1 x daily - 7 x weekly - 1 sets - 3 reps - 30-60seconds hold  ?Standing Shoulder Abduction Slides at Wall - 1 x daily - 7 x weekly - 1 sets - 5 reps - 6 second hold ?Upper trap and levator stretch added 02/22/22 ? ?ASSESSMENT: ?CLINICAL IMPRESSION: ?Pt seems to have reached a plateau in motion with continued axilla stiffness until implant exchange.  Pt leaves on a cruise tomorrow but still may be interested in dry needling when returning.   ? ?GOALS: ?Goals reviewed with patient? Yes ? ?SHORT TERM GOALS: ? ?Pt will be educated on lymphedema risk reduction practices and how to watch for the initial signs of swelling.  ?Baseline:   ?Target date:  04/19/2022 ?Goal status: MET ? ? ?LONG TERM GOALS: ? ?Pt will improve UE flexion AROM to at least 170 to improve mobility ?Baseline: 150 ?Target date: 05/17/2022 ?Goal status: INITIAL ? ?2.  Pt will be ind with

## 2022-04-15 ENCOUNTER — Ambulatory Visit: Payer: BC Managed Care – PPO | Admitting: Hematology and Oncology

## 2022-04-16 ENCOUNTER — Encounter: Payer: Self-pay | Admitting: Rehabilitation

## 2022-04-16 ENCOUNTER — Ambulatory Visit: Payer: BC Managed Care – PPO | Admitting: Rehabilitation

## 2022-04-16 DIAGNOSIS — Z9189 Other specified personal risk factors, not elsewhere classified: Secondary | ICD-10-CM

## 2022-04-16 DIAGNOSIS — M542 Cervicalgia: Secondary | ICD-10-CM | POA: Diagnosis not present

## 2022-04-16 DIAGNOSIS — Z483 Aftercare following surgery for neoplasm: Secondary | ICD-10-CM | POA: Diagnosis not present

## 2022-04-16 DIAGNOSIS — R293 Abnormal posture: Secondary | ICD-10-CM

## 2022-04-16 DIAGNOSIS — D0511 Intraductal carcinoma in situ of right breast: Secondary | ICD-10-CM

## 2022-04-16 DIAGNOSIS — M25611 Stiffness of right shoulder, not elsewhere classified: Secondary | ICD-10-CM

## 2022-04-16 NOTE — Therapy (Signed)
OUTPATIENT PHYSICAL THERAPY ONCOLOGY TREATMENT  Patient Name: Joann Meadows MRN: [id number] DOB:19-Feb-1976, 46 y.o., female Today's Date: 04/16/2022   PT End of Session - 04/16/22 0904     Visit Number 9    Number of Visits 13    Date for PT Re-Evaluation 04/19/22    PT Start Time 0905    PT Stop Time 0950    PT Time Calculation (min) 45 min    Activity Tolerance Patient tolerated treatment well    Behavior During Therapy [facility] for tasks assessed/performed               Past Medical History:  Diagnosis Date   Blood transfusion without reported diagnosis    leaukemia 18 years ago   Breast cancer ([place]) 2020   Right Breast Cancer   Endometriosis    Family history of colon cancer    Family history of leukemia    Fibroid    Hx of radiation therapy 06/2019   Leukemia ([place])    20 years ago   Personal history of radiation therapy 2020   Right Breast Cancer   PONV (postoperative nausea and vomiting)    pt states she vomits after colonoscopies   Past Surgical History:  Procedure Laterality Date   BREAST EXCISIONAL BIOPSY Left 02/11/2019   [facility]   BREAST LUMPECTOMY Right 02/11/2019   BREAST LUMPECTOMY WITH RADIOACTIVE SEED LOCALIZATION Bilateral 02/11/2019   Procedure: BILATERAL BREAST LUMPECTOMIES WITH BILATERAL RADIOACTIVE SEED LOCALIZATION;  Surgeon: Donnie Mesa, MD;  Location: [place];  Service: General;  Laterality: Bilateral;   Patient Active Problem List   Diagnosis Date Noted   COPD possible, would be Gold Group B 11/25/2019   DOE (dyspnea on exertion) 11/24/2019   Genetic testing 02/02/2019   Ductal carcinoma in situ (DCIS) of right breast 01/25/2019   AML (acute myeloid leukemia) ([place]) 01/25/2019   Family history of colon cancer    Family history of leukemia    Delivery normal 03/25/2017    PCP: [name], L.Marlou Sa, MD  REFERRING PROVIDER: Candace Gallus, PA  REFERRING DIAG: Rt breast cancer  THERAPY DIAG:  Ductal carcinoma in situ  (DCIS) of right breast  Stiffness of right shoulder, not elsewhere classified  At risk for lymphedema  Aftercare following surgery for neoplasm  Abnormal posture  Cervicalgia  ONSET DATE: 11/29/21  SUBJECTIVE                                                                                                                                                                                           SUBJECTIVE STATEMENT:  It is feeling better. I have my implant  switch in November  PERTINENT HISTORY:  Leukemia history, previous Rt breast lumpectomy and radiation in 2020. Rt mastectomy with expander placement and SLNB 0/1 LN positive on 11/29/21.    PAIN:  Are you having pain? No  PRECAUTIONS: Other: lymphedema risk Rt UE  OCCUPATION: stay at home mom.  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 I have help.   OBJECTIVE  UPPER EXTREMITY AROM/PROM:  A/PROM RIGHT    02/22/22 03/22/22 04/05/22  Shoulder extension 45 45 50   Shoulder flexion 150 - tight in axilla  165 - tight in axilla  167 - pull in axilla  167- pull in the axilla   Shoulder abduction 160- tight in axilla  165 - tight in pectoralis  167 - pull in axilla 167 - pull in lat not axilla   Shoulder internal rotation 70     Shoulder external rotation 90       (Blank rows = not tested)  A/PROM LEFT   02/22/22  Shoulder extension 30   Shoulder flexion 175   Shoulder abduction 164   Shoulder internal rotation    Shoulder external rotation 105     (Blank rows = not tested)  TODAY'S TREATMENT  04/16/22 Therapeutic Exs:  Pulleys x 28min flexion and abduction Ball rolls flexion x 10  Shoulder flexion, abduction, scaption bil 2#  Manual therapy: PROM: In supine to Rt shoulder into flexion, abduction, ER, and D2 flexion and axillary release and latissimus MFR incorporated MLD: short neck, Rt inguinal nodes, Rt axilloinguinal anastamosis and then focus on redirecting lateral trunk edema to nodes throughout all MT;   Avoided any work over [employer] pt for [employer] soft size 2 max regular sleeve and gauntlet size Small and sent order and demo to [facility]  04/05/22 Therapeutic Exs:  Pulleys x 37min flexion and abduction Ball rolls flexion x 10  Supine scapular series with yellow theraband x10 each returning therapist demo and handout issued Manual therapy: PROM: In supine to Rt shoulder into flexion, abduction, ER, and D2 flexion and axillary release and latissimus MFR incorporated; then cervical P/ROM briefly towards end of session into bil side bend and rotation, to Lt>Rt MLD: short neck, Rt inguinal nodes, Rt axilloinguinal anastamosis and then focus on redirecting lateral trunk edema to nodes throughout all MT;  Avoided any work over expander   03/22/22 Therapeutic Exs:  Pulleys x 40min flexion and abduction  Supine scapular series with yellow theraband x10 each returning therapist demo and handout issued Manual therapy: PROM: In supine to Rt shoulder into flexion, abduction, ER, and D2 flexion and axillary release and latissimus MFR incorporated; then cervical P/ROM briefly towards end of session into bil side bend and rotation, to Lt>Rt MLD: short neck, Rt inguinal nodes, Rt axilloinguinal anastamosis and then focus on redirecting lateral trunk edema to nodes throughout all MT;  Avoided any work over expander   03/15/22: Therapeutic Exs:  Supine scapular series with yellow theraband x8 each returning therapist demo and handout issued Manual therapy: PROM: In supine to Rt shoulder into flexion, abduction, ER, and D2 flexion and axillary release and latissimus MFR incorporated; then cervical P/ROM briefly towards end of session into bil side bend and rotation, to Lt>Rt MLD: short neck, Rt inguinal nodes, Rt axilloinguinal anastamosis and then focus on redirecting lateral trunk edema to nodes throughout all MT;  Avoided any work over expander  02/15/22: Pulleys flexion x 73min and abduction x 36min Wall  ball flexion 5" x 5  and abduction 5" x 5 Doorway stretch 3x20" Rt UE only  Dowel flexion easy today  Low trunk rotation 6" x 4 bil Pt mentioned neck pain with c/o chronic pain in the Rt>Lt upper trap and levator region moving up into the head and causing headaches and trouble feeling dizzy.  Pt did have pain reproduced with trigger point palpation to the Rt UT, levator, and cervical multifidi. Discussed self trigger point release, UT, and LS stretches PROM of Rt shoulder into flexion, abduction, ER, and D2 flexion and axillary release and latissimus MFR incorporated  STM to pectoralis, serratus, Latissimus, in supine as well as Rt upper trap and into cervical and suboccipitals Continues with mild lateral trunk puffiness so performed short neck, Rt axillary nodes, Rt inguinal nodes, Rt axilloinguinal anastamosis and then focus on redirecting lateral trunk edema to nodes.  Avoided any work over Systems developer note to Dr. Lindi Adie to add neck pain and dry needling    PATIENT EDUCATION:  Education details: Supine scapular series with yellow theraband Person educated: Patient Education method: Explanation, Demonstration, Verbal cues, and Handouts Education comprehension: verbalized understanding and returned demonstration   HOME EXERCISE PROGRAM: Access Code: [email or web address] URL: [email or web address]: 03/08/2023Prepared by: Marcene Brawn TevisExercises  Supine Shoulder Flexion Extension AAROM with Dowel - 1-2 x daily - 7 x weekly - 10 reps - 5 seconds hold  Supine Chest Stretch with Elbows Bent - 1 x daily - 7 x weekly - 1 sets - 3 reps - 30-60seconds hold  Standing Shoulder Abduction Slides at Wall - 1 x daily - 7 x weekly - 1 sets - 5 reps - 6 second hold Upper trap and levator stretch added 02/22/22  ASSESSMENT: CLINICAL IMPRESSION:  Pt is doing very well.  Minimal limitations during PROM.  Continues with some puffiness lateral trunk but may be more skin at this time.  Pt has 1 more session  before leaving for [place].  Will do SOZO for estimated baseline as pt has had no subjective signs of swelling.    GOALS: Goals reviewed with patient? Yes  SHORT TERM GOALS:  Pt will be educated on lymphedema risk reduction practices and how to watch for the initial signs of swelling.  Baseline:   Target date: 04/30/2022 Goal status: MET   LONG TERM GOALS:  Pt will improve UE flexion AROM to at least 170 to improve mobility Baseline: 150 Target date: 05/28/2022 Goal status: INITIAL  2.  Pt will be ind with final HEP for continued strength and mobility  Baseline:  Target date: 05/28/2022 Goal status: INITIAL  PLAN: PT FREQUENCY: 1-2x per week  PT DURATION: 8 more weeks  PLANNED INTERVENTIONS: Therapeutic exercises, Patient/Family education, and Manual therapy, Dry Needling, modalities as needed  PLAN FOR NEXT SESSION: Rt UE PROM with STM release Rt upper quadrant and neck, pulleys, ball, , review supine scapular x 5 each yellow prn *Do sozo for baseline as pt has no subjective symptoms of swelling   Stark Bray, PT 04/16/2022, 9:50 AM

## 2022-04-19 DIAGNOSIS — C50911 Malignant neoplasm of unspecified site of right female breast: Secondary | ICD-10-CM | POA: Diagnosis not present

## 2022-04-23 ENCOUNTER — Encounter: Payer: BC Managed Care – PPO | Admitting: Rehabilitation

## 2022-04-25 ENCOUNTER — Encounter: Payer: Self-pay | Admitting: Rehabilitation

## 2022-04-25 ENCOUNTER — Ambulatory Visit: Payer: BC Managed Care – PPO | Attending: Physician Assistant | Admitting: Rehabilitation

## 2022-04-25 DIAGNOSIS — R293 Abnormal posture: Secondary | ICD-10-CM | POA: Diagnosis not present

## 2022-04-25 DIAGNOSIS — M542 Cervicalgia: Secondary | ICD-10-CM | POA: Diagnosis not present

## 2022-04-25 DIAGNOSIS — Z483 Aftercare following surgery for neoplasm: Secondary | ICD-10-CM | POA: Insufficient documentation

## 2022-04-25 DIAGNOSIS — Z9189 Other specified personal risk factors, not elsewhere classified: Secondary | ICD-10-CM | POA: Insufficient documentation

## 2022-04-25 DIAGNOSIS — D0511 Intraductal carcinoma in situ of right breast: Secondary | ICD-10-CM | POA: Diagnosis not present

## 2022-04-25 DIAGNOSIS — M25611 Stiffness of right shoulder, not elsewhere classified: Secondary | ICD-10-CM | POA: Diagnosis not present

## 2022-04-25 NOTE — Therapy (Signed)
OUTPATIENT PHYSICAL THERAPY ONCOLOGY TREATMENT  Patient Name: Joann Meadows MRN: [id number] DOB:07/24/1976, 46 y.o., female Today's Date: 04/25/2022   PT End of Session - 04/25/22 0800     Visit Number 10    Number of Visits 13    PT Start Time 0800    PT Stop Time 2694    PT Time Calculation (min) 55 min    Activity Tolerance Patient tolerated treatment well    Behavior During Therapy [facility] for tasks assessed/performed                Past Medical History:  Diagnosis Date   Blood transfusion without reported diagnosis    leaukemia 18 years ago   Breast cancer ([place]) 2020   Right Breast Cancer   Endometriosis    Family history of colon cancer    Family history of leukemia    Fibroid    Hx of radiation therapy 06/2019   Leukemia ([place])    20 years ago   Personal history of radiation therapy 2020   Right Breast Cancer   PONV (postoperative nausea and vomiting)    pt states she vomits after colonoscopies   Past Surgical History:  Procedure Laterality Date   BREAST EXCISIONAL BIOPSY Left 02/11/2019   [facility]   BREAST LUMPECTOMY Right 02/11/2019   BREAST LUMPECTOMY WITH RADIOACTIVE SEED LOCALIZATION Bilateral 02/11/2019   Procedure: BILATERAL BREAST LUMPECTOMIES WITH BILATERAL RADIOACTIVE SEED LOCALIZATION;  Surgeon: Donnie Mesa, MD;  Location: [place];  Service: General;  Laterality: Bilateral;   Patient Active Problem List   Diagnosis Date Noted   COPD possible, would be Gold Group B 11/25/2019   DOE (dyspnea on exertion) 11/24/2019   Genetic testing 02/02/2019   Ductal carcinoma in situ (DCIS) of right breast 01/25/2019   AML (acute myeloid leukemia) ([place]) 01/25/2019   Family history of colon cancer    Family history of leukemia    Delivery normal 03/25/2017    PCP: [name], L.Marlou Sa, MD  REFERRING PROVIDER: Candace Gallus, PA  REFERRING DIAG: Rt breast cancer  THERAPY DIAG:  Ductal carcinoma in situ (DCIS) of right breast  Stiffness  of right shoulder, not elsewhere classified  At risk for lymphedema  Aftercare following surgery for neoplasm  Abnormal posture  Cervicalgia  ONSET DATE: 11/29/21  SUBJECTIVE                                                                                                                                                                                           SUBJECTIVE STATEMENT:  I started having some pain inferior breast at one point.  Not better or worse  with any movements.    PERTINENT HISTORY:  Leukemia history, previous Rt breast lumpectomy and radiation in 2020. Rt mastectomy with expander placement and SLNB 0/1 LN positive on 11/29/21.    PAIN:  Are you having pain? No  PRECAUTIONS: Other: lymphedema risk Rt UE  OCCUPATION: stay at home mom.  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 I have help.   OBJECTIVE  UPPER EXTREMITY AROM/PROM:  A/PROM RIGHT    02/22/22 03/22/22 04/05/22  Shoulder extension 45 45 50   Shoulder flexion 150 - tight in axilla  165 - tight in axilla  167 - pull in axilla  167- pull in the axilla   Shoulder abduction 160- tight in axilla  165 - tight in pectoralis  167 - pull in axilla 167 - pull in lat not axilla   Shoulder internal rotation 70     Shoulder external rotation 90       (Blank rows = not tested)  A/PROM LEFT   02/22/22  Shoulder extension 30   Shoulder flexion 175   Shoulder abduction 164   Shoulder internal rotation    Shoulder external rotation 105     (Blank rows = not tested)  TODAY'S TREATMENT  04/25/22 Sleeve info ended up sent to [facility] custom due to no [facility] coverage. [name] is starting to process but pt will give pt ordering information Performed SOZO prior to international travel: score of 5.9 Therapeutic Exs:  Pulleys x 64mn flexion and abduction Ball rolls flexion x 5 and abduction x 5 Shoulder flexion, abduction, scaption bil 2# x 10 Manual therapy: PROM: In supine to Rt shoulder into flexion, abduction, ER,  and D2 flexion and axillary release and latissimus MFR incorporated MLD: short neck, Rt inguinal nodes, Rt axilloinguinal anastamosis and then focus on redirecting lateral trunk edema to nodes throughout all MT;  Avoided any work over expander  04/16/22 Therapeutic Exs:  Pulleys x 2634m flexion and abduction Ball rolls flexion x 10  Shoulder flexion, abduction, scaption bil 2#  Manual therapy: PROM: In supine to Rt shoulder into flexion, abduction, ER, and D2 flexion and axillary release and latissimus MFR incorporated MLD: short neck, Rt inguinal nodes, Rt axilloinguinal anastamosis and then focus on redirecting lateral trunk edema to nodes throughout all MT;  Avoided any work over exDeere & Companyt for juGoodrich Corporationoft size 2 max regular sleeve and gauntlet size Small and sent order and demo to [facility]  04/05/22 Therapeutic Exs:  Pulleys x 34m69mflexion and abduction Ball rolls flexion x 10  Supine scapular series with yellow theraband x10 each returning therapist demo and handout issued Manual therapy: PROM: In supine to Rt shoulder into flexion, abduction, ER, and D2 flexion and axillary release and latissimus MFR incorporated; then cervical P/ROM briefly towards end of session into bil side bend and rotation, to Lt>Rt MLD: short neck, Rt inguinal nodes, Rt axilloinguinal anastamosis and then focus on redirecting lateral trunk edema to nodes throughout all MT;  Avoided any work over expander   PATIENT EDUCATION:  Education details: Supine scapular series with yellow theraband Person educated: Patient Education method: ExpConsulting civil engineeremMedia plannererbal cues, and Handouts Education comprehension: verbalized understanding and returned demonstration   HOME EXERCISE PROGRAM: Access Code: [email or web address] URL: [email or web address]: 03/08/2023Prepared by: KarMarcene BrawnvisExercises  Supine Shoulder Flexion Extension AAROM with Dowel - 1-2 x daily - 7 x weekly - 10 reps - 5 seconds hold   Supine Chest Stretch with Elbows Bent - 1 x daily - 7  x weekly - 1 sets - 3 reps - 30-60seconds hold  Standing Shoulder Abduction Slides at Wall - 1 x daily - 7 x weekly - 1 sets - 5 reps - 6 second hold Upper trap and levator stretch added 02/22/22  ASSESSMENT: CLINICAL IMPRESSION: Pt will be going to [place] but may return after summer to recheck status.  Pt has minimal tightness with PROM and minimal tenderness with palpation.    GOALS: Goals reviewed with patient? Yes  SHORT TERM GOALS:  Pt will be educated on lymphedema risk reduction practices and how to watch for the initial signs of swelling.  Baseline:   Target date: 04/30/2022 Goal status: MET   LONG TERM GOALS:  Pt will improve UE flexion AROM to at least 170 to improve mobility Baseline: 150 Target date: 05/28/2022 Goal status: INITIAL  2.  Pt will be ind with final HEP for continued strength and mobility  Baseline:  Target date: 05/28/2022 Goal status: INITIAL  PLAN: PT FREQUENCY: 1-2x per week  PT DURATION: 8 more weeks  PLANNED INTERVENTIONS: Therapeutic exercises, Patient/Family education, and Manual therapy, Dry Needling, modalities as needed  PLAN FOR NEXT SESSION: Rt UE PROM with STM release Rt upper quadrant and neck, pulleys, ball, , review supine scapular x 5 each yellow prn *Do sozo for baseline as pt has no subjective symptoms of swelling   Stark Bray, PT 04/25/2022, 8:56 AM

## 2022-05-06 DIAGNOSIS — Z9011 Acquired absence of right breast and nipple: Secondary | ICD-10-CM | POA: Diagnosis not present

## 2022-05-06 DIAGNOSIS — D0511 Intraductal carcinoma in situ of right breast: Secondary | ICD-10-CM | POA: Diagnosis not present

## 2022-05-07 DIAGNOSIS — M25872 Other specified joint disorders, left ankle and foot: Secondary | ICD-10-CM | POA: Diagnosis not present

## 2022-05-07 DIAGNOSIS — M25572 Pain in left ankle and joints of left foot: Secondary | ICD-10-CM | POA: Diagnosis not present

## 2022-05-16 DIAGNOSIS — I972 Postmastectomy lymphedema syndrome: Secondary | ICD-10-CM | POA: Diagnosis not present

## 2022-06-11 ENCOUNTER — Ambulatory Visit: Payer: BC Managed Care – PPO | Admitting: Hematology and Oncology

## 2022-06-13 DIAGNOSIS — C50911 Malignant neoplasm of unspecified site of right female breast: Secondary | ICD-10-CM | POA: Diagnosis not present

## 2022-07-23 DIAGNOSIS — E559 Vitamin D deficiency, unspecified: Secondary | ICD-10-CM | POA: Diagnosis not present

## 2022-07-24 DIAGNOSIS — I10 Essential (primary) hypertension: Secondary | ICD-10-CM | POA: Diagnosis not present

## 2022-08-05 DIAGNOSIS — H0102A Squamous blepharitis right eye, upper and lower eyelids: Secondary | ICD-10-CM | POA: Diagnosis not present

## 2022-08-05 DIAGNOSIS — H04123 Dry eye syndrome of bilateral lacrimal glands: Secondary | ICD-10-CM | POA: Diagnosis not present

## 2022-08-05 DIAGNOSIS — H18832 Recurrent erosion of cornea, left eye: Secondary | ICD-10-CM | POA: Diagnosis not present

## 2022-08-05 DIAGNOSIS — H0102B Squamous blepharitis left eye, upper and lower eyelids: Secondary | ICD-10-CM | POA: Diagnosis not present

## 2022-08-08 DIAGNOSIS — G245 Blepharospasm: Secondary | ICD-10-CM | POA: Diagnosis not present

## 2022-08-08 DIAGNOSIS — E7800 Pure hypercholesterolemia, unspecified: Secondary | ICD-10-CM | POA: Diagnosis not present

## 2022-08-08 DIAGNOSIS — I10 Essential (primary) hypertension: Secondary | ICD-10-CM | POA: Diagnosis not present

## 2022-08-08 DIAGNOSIS — I700 Atherosclerosis of aorta: Secondary | ICD-10-CM | POA: Diagnosis not present

## 2022-08-09 DIAGNOSIS — E7800 Pure hypercholesterolemia, unspecified: Secondary | ICD-10-CM | POA: Diagnosis not present

## 2022-08-16 NOTE — Progress Notes (Signed)
Patient Care Team: [name], L.Marlou Sa, MD as PCP - General (Family Medicine) Rockwell Germany, RN as Oncology Nurse Navigator [name], RN as Oncology Nurse Navigator  DIAGNOSIS: No diagnosis found.  SUMMARY OF ONCOLOGIC HISTORY: Oncology History  Ductal carcinoma in situ (DCIS) of right breast  01/25/2019 Initial Diagnosis   Screening mammogram showed bilateral calcifications right breast 5 mm, pleomorphic.  Left breast 1.6 cm punctate, smaller group of calcifications spanning 3 mm.  Right lower outer quadrant biopsy revealed low-grade DCIS ER 90%, PR 70%; biopsy of left breast calcifications ALH and PASH   01/31/2019 Genetic Testing   ATM c.8495G>A and RECQL4 c.2587G>A VUS identified on the 9-gene STAT panel and Multi-cancer panel through [facility].  The STAT Breast cancer panel offered by [facility] includes sequencing and rearrangement analysis for the following 9 genes:  ATM, BRCA1, BRCA2, CDH1, CHEK2, PALB2, PTEN, STK11 and TP53.   The Multi-Gene Panel offered by [facility] includes sequencing and/or deletion duplication testing of the following 84 genes: AIP, ALK, APC, ATM, AXIN2,BAP1,  BARD1, BLM, BMPR1A, BRCA1, BRCA2, BRIP1, CASR, CDC73, CDH1, CDK4, CDKN1B, CDKN1C, CDKN2A (p14ARF), CDKN2A (p16INK4a), CEBPA, CHEK2, CTNNA1, DICER1, DIS3L2, EGFR (c.2369C>T, p.Thr790Met variant only), EPCAM (Deletion/duplication testing only), FH, FLCN, GATA2, GPC3, GREM1 (Promoter region deletion/duplication testing only), HOXB13 (c.251G>A, p.Gly84Glu), HRAS, KIT, MAX, MEN1, MET, MITF (c.952G>A, p.Glu318Lys variant only), MLH1, MSH2, MSH3, MSH6, MUTYH, NBN, NF1, NF2, NTHL1, PALB2, PDGFRA, PHOX2B, PMS2, POLD1, POLE, POT1, PRKAR1A, PTCH1, PTEN, RAD50, RAD51C, RAD51D, RB1, RECQL4, RET, RUNX1, SDHAF2, SDHA (sequence changes only), SDHB, SDHC, SDHD, SMAD4, SMARCA4, SMARCB1, SMARCE1, STK11, SUFU, TERC, TERT, TMEM127, TP53, TSC1, TSC2, VHL, WRN and WT1.  he report date is February 02, 2019.   02/11/2019 Surgery   RT  lumpectomy: DICS Intermediate grade, Margins Neg, ER 90%, PR 70%; Lt Lumpectomy: UDH   02/26/2019 -  Anti-estrogen oral therapy   Tamoxifen 52m daily, stopped because of depression and facial numbness   05/20/2019 - 06/17/2019 Radiation Therapy   Adjuvant XRT   08/15/2021 Relapse/Recurrence   MRI surveillance detected Right breast calcifications 5 cm, Ant biopsy: IG DCIS with necrosis, ER 40%, PR 0%, Posterior biopsy: Intermediate grade DCIS with ALH   AML (acute myeloid leukemia) ([place]  09/1998 Initial Diagnosis   AML (acute myeloid leukemia) ([place] treated with 2 induction regimens followed by 6 consolidation regimens, remission     CHIEF COMPLIANT: [facility] DC[facility] [place] 46y.[facility]. with above-mentioned history of right breast DCIS for which she underwent a lumpectomy. She presents to the clinic today for a follow-up.    ALLERGIES:  is allergic to tamoxifen citrate.  MEDICATIONS:  Current Outpatient Medications  Medication Sig Dispense Refill   ibuprofen (ADVIL) 200 MG tablet Take 2 tablets by mouth every 12 (twelve) hours as needed.     Polyethyl Glycol-Propyl Glycol 0.4-0.3 % SOLN Apply 2 drops to eye in the morning, at noon, in the evening, and at bedtime.     tamoxifen (NOLVADEX) 10 MG tablet TAKE 1 TABLET BY MOUTH TWICE A DAY 30 tablet 3   triamcinolone (KENALOG) 0.025 % ointment Apply 1 application topically 2 (two) times daily. 30 g 0   No current facility-administered medications for this visit.    PHYSICAL EXAMINATION: ECOG PERFORMANCE STATUS: {CHL ONC ECOG PS:[phone]}  There were no vitals filed for this visit. There were no vitals filed for this visit.  BREAST:*** No palpable masses or nodules in either right or  left breasts. No palpable axillary supraclavicular or infraclavicular adenopathy no breast tenderness or nipple discharge. (exam performed in the presence of a  chaperone)  LABORATORY DATA:  I have reviewed the data as listed    Latest Ref Rng & Units 10/27/2019    3:28 PM 06/16/2019    2:38 PM 06/15/2019    3:35 PM  CMP  Glucose 70 - 99 mg/dL 96  117  111   BUN 6 - 20 mg/dL _0 Creatinine 0.44 - 1.00 mg/dL 0.84  0.82  0.70   Sodium 135 - 145 mmol/L 141  142  143   Potassium 3.5 - 5.1 mmol/L 3.9  4.4  3.9   Chloride 98 - 111 mmol/L 107  109  109   CO2 22 - 32 mmol/L 25  24    Calcium 8.9 - 10.3 mg/dL 9.1  9.1    Total Protein 6.5 - 8.1 g/dL 7.2  6.9    Total Bilirubin 0.3 - 1.2 mg/dL 0.3  0.4    Alkaline Phos 38 - 126 U/L 59  45    AST 15 - 41 U/L 13  11    ALT 0 - 44 U/L 15  12      Lab Results  Component Value Date   WBC 6.7 10/27/2019   HGB 13.1 10/27/2019   HCT 40.2 10/27/2019   MCV 91.8 10/27/2019   PLT 220 10/27/2019   NEUTROABS 4.2 10/27/2019    ASSESSMENT & PLAN:  No problem-specific Assessment & Plan notes found for this encounter.    No orders of the defined types were placed in this encounter.  The patient has a good understanding of the overall plan. she agrees with it. she will call with any problems that may develop before the next visit here. Total time spent: 30 mins including face to face time and time spent for planning, charting and co-ordination of care   Joann Meadows, [place] 08/16/22    I Gardiner Coins am scribing for Dr. Lindi Adie  ***

## 2022-08-19 ENCOUNTER — Inpatient Hospital Stay: Payer: BC Managed Care – PPO | Attending: Hematology and Oncology | Admitting: Hematology and Oncology

## 2022-08-19 ENCOUNTER — Other Ambulatory Visit: Payer: Self-pay

## 2022-08-19 VITALS — BP 171/85 | HR 85 | Temp 97.7°F | Resp 18 | Ht 65.0 in | Wt 172.2 lb

## 2022-08-19 DIAGNOSIS — D0511 Intraductal carcinoma in situ of right breast: Secondary | ICD-10-CM | POA: Insufficient documentation

## 2022-08-19 DIAGNOSIS — Z170 Estrogen receptor positive status [ER+]: Secondary | ICD-10-CM | POA: Diagnosis not present

## 2022-08-19 DIAGNOSIS — Z79810 Long term (current) use of selective estrogen receptor modulators (SERMs): Secondary | ICD-10-CM | POA: Diagnosis not present

## 2022-08-19 NOTE — Assessment & Plan Note (Signed)
02/11/19:RT lumpectomy: DICS Intermediate grade, Margins Neg, ER 90%, PR 70%; Lt Lumpectomy: UDHstatus post radiation, could not tolerate tamoxifen recurrence: 08/15/2021:MRI surveillance detected Right breast calcifications 5 cm, Ant biopsy: IG DCIS with necrosis, ER 40%, PR 0%, Posterior biopsy:Intermediate grade DCIS withALH 11/29/2021: Right mastectomy: High-grade DCIS with microinvasion, ER 0%, PR 0%, HER2 positive, 0/1 lymph node negative   Patient had second opinion at [facility] with Dr. Westbrook who agreed with her plan of no systemic chemotherapy  Breast cancer surveillance: Left breast mammogram and MRIs 1.  Mammogram 08/21/2021: Benign breast density category C  Next mammogram arranged for 08/22/2022  We will set her up with [facility] for blood testing  

## 2022-08-20 ENCOUNTER — Telehealth: Payer: Self-pay | Admitting: Hematology and Oncology

## 2022-08-20 NOTE — Telephone Encounter (Signed)
Scheduled appointment per 9/25 los,. Patient is aware.

## 2022-08-22 ENCOUNTER — Ambulatory Visit
Admission: RE | Admit: 2022-08-22 | Discharge: 2022-08-22 | Disposition: A | Discharge status: Home / Self Care | Payer: BC Managed Care – PPO | Source: Ambulatory Visit | Attending: Adult Health | Admitting: Adult Health

## 2022-08-22 DIAGNOSIS — Z1231 Encounter for screening mammogram for malignant neoplasm of breast: Secondary | ICD-10-CM | POA: Diagnosis not present

## 2022-08-22 DIAGNOSIS — D0511 Intraductal carcinoma in situ of right breast: Secondary | ICD-10-CM

## 2022-09-09 DIAGNOSIS — Z9011 Acquired absence of right breast and nipple: Secondary | ICD-10-CM | POA: Diagnosis not present

## 2022-09-10 DIAGNOSIS — Z0000 Encounter for general adult medical examination without abnormal findings: Secondary | ICD-10-CM | POA: Diagnosis not present

## 2022-09-11 DIAGNOSIS — Z0000 Encounter for general adult medical examination without abnormal findings: Secondary | ICD-10-CM | POA: Diagnosis not present

## 2022-09-11 DIAGNOSIS — E7800 Pure hypercholesterolemia, unspecified: Secondary | ICD-10-CM | POA: Diagnosis not present

## 2022-09-11 DIAGNOSIS — E559 Vitamin D deficiency, unspecified: Secondary | ICD-10-CM | POA: Diagnosis not present

## 2022-09-11 DIAGNOSIS — Z131 Encounter for screening for diabetes mellitus: Secondary | ICD-10-CM | POA: Diagnosis not present

## 2022-10-03 ENCOUNTER — Telehealth: Payer: Self-pay

## 2022-10-03 NOTE — Patient Outreach (Signed)
  Care Coordination   Initial Visit Note   10/03/2022 Name: Joann Meadows MRN: [id number] DOB: 1976/05/23  Joann Meadows is a 46 y.o. year old female who sees [place], L.Marlou Sa, MD for primary care. I spoke with  Ria Clock by phone today.  What matters to the patients health and wellness today?  No concerns today. I am waiting to have my reconstruction surgery scheduled but otherwise I am doing good   Goals Addressed             This Visit's Progress    COMPLETED: Care Coordination Activities - no follow up required       Care Coordination Interventions: Provided education to patient re: care coordination services, regular follow up with providers Assessed social determinant of health barriers          SDOH assessments and interventions completed:  Yes  SDOH Interventions Today    Flowsheet Row Most Recent Value  SDOH Interventions   Food Insecurity Interventions Intervention Not Indicated  Housing Interventions Intervention Not Indicated  Transportation Interventions Intervention Not Indicated  Utilities Interventions Intervention Not Indicated        Care Coordination Interventions Activated:  Yes  Care Coordination Interventions:  Yes, provided   Follow up plan: No further intervention required.   Encounter Outcome:  Pt. Visit Completed  Peter Garter RN, BSN,CCM, CDE Care Management Coordinator [facility] [phone]

## 2022-10-03 NOTE — Patient Instructions (Signed)
Visit Information  Thank you for taking time to visit with me today. Please don't hesitate to contact me if I can be of assistance to you.   Following are the goals we discussed today:   Goals Addressed             This Visit's Progress    COMPLETED: Care Coordination Activities - no follow up required       Care Coordination Interventions: Provided education to patient re: care coordination services, regular follow up with providers Assessed social determinant of health barriers          If you are experiencing a Mental Health or [place] or need someone to talk to, please call the Suicide and Crisis Lifeline: 988 call the [place] National Suicide Prevention Lifeline: 4324624579 or TTY: (613) 881-8113 TTY (937)610-3325) to talk to a trained counselor call 1-800-273-TALK (toll free, 24 hour hotline) go to [facility] [place] [phone]) call 911   The patient verbalized understanding of instructions, educational materials, and care plan provided today and DECLINED offer to receive copy of patient instructions, educational materials, and care plan.   No further follow up required:    Peter Garter RN, [place], [place] [phone]

## 2022-10-14 DIAGNOSIS — J069 Acute upper respiratory infection, unspecified: Secondary | ICD-10-CM | POA: Diagnosis not present

## 2022-10-14 DIAGNOSIS — R051 Acute cough: Secondary | ICD-10-CM | POA: Diagnosis not present

## 2022-10-23 DIAGNOSIS — Z853 Personal history of malignant neoplasm of breast: Secondary | ICD-10-CM | POA: Diagnosis not present

## 2022-10-23 DIAGNOSIS — C50911 Malignant neoplasm of unspecified site of right female breast: Secondary | ICD-10-CM | POA: Diagnosis not present

## 2022-10-23 DIAGNOSIS — Z833 Family history of diabetes mellitus: Secondary | ICD-10-CM | POA: Diagnosis not present

## 2022-10-23 DIAGNOSIS — I10 Essential (primary) hypertension: Secondary | ICD-10-CM | POA: Diagnosis not present

## 2022-10-23 DIAGNOSIS — Z421 Encounter for breast reconstruction following mastectomy: Secondary | ICD-10-CM | POA: Diagnosis not present

## 2022-10-23 DIAGNOSIS — N641 Fat necrosis of breast: Secondary | ICD-10-CM | POA: Diagnosis not present

## 2022-10-23 DIAGNOSIS — N6092 Unspecified benign mammary dysplasia of left breast: Secondary | ICD-10-CM | POA: Diagnosis not present

## 2022-10-23 DIAGNOSIS — Z8249 Family history of ischemic heart disease and other diseases of the circulatory system: Secondary | ICD-10-CM | POA: Diagnosis not present

## 2022-10-23 DIAGNOSIS — Z923 Personal history of irradiation: Secondary | ICD-10-CM | POA: Diagnosis not present

## 2022-10-23 DIAGNOSIS — M958 Other specified acquired deformities of musculoskeletal system: Secondary | ICD-10-CM | POA: Diagnosis not present

## 2022-10-23 DIAGNOSIS — Z87891 Personal history of nicotine dependence: Secondary | ICD-10-CM | POA: Diagnosis not present

## 2022-10-23 DIAGNOSIS — N650 Deformity of reconstructed breast: Secondary | ICD-10-CM | POA: Diagnosis not present

## 2022-10-23 DIAGNOSIS — Z9011 Acquired absence of right breast and nipple: Secondary | ICD-10-CM | POA: Diagnosis not present

## 2022-10-23 DIAGNOSIS — Z79899 Other long term (current) drug therapy: Secondary | ICD-10-CM | POA: Diagnosis not present

## 2022-10-23 DIAGNOSIS — N651 Disproportion of reconstructed breast: Secondary | ICD-10-CM | POA: Diagnosis not present

## 2022-10-24 DIAGNOSIS — I10 Essential (primary) hypertension: Secondary | ICD-10-CM | POA: Diagnosis not present

## 2022-10-24 DIAGNOSIS — Z923 Personal history of irradiation: Secondary | ICD-10-CM | POA: Diagnosis not present

## 2022-10-24 DIAGNOSIS — Z79899 Other long term (current) drug therapy: Secondary | ICD-10-CM | POA: Diagnosis not present

## 2022-10-24 DIAGNOSIS — C50911 Malignant neoplasm of unspecified site of right female breast: Secondary | ICD-10-CM | POA: Diagnosis not present

## 2022-10-24 DIAGNOSIS — Z8249 Family history of ischemic heart disease and other diseases of the circulatory system: Secondary | ICD-10-CM | POA: Diagnosis not present

## 2022-10-24 DIAGNOSIS — Z833 Family history of diabetes mellitus: Secondary | ICD-10-CM | POA: Diagnosis not present

## 2022-10-24 DIAGNOSIS — Z87891 Personal history of nicotine dependence: Secondary | ICD-10-CM | POA: Diagnosis not present

## 2022-10-24 DIAGNOSIS — Z853 Personal history of malignant neoplasm of breast: Secondary | ICD-10-CM | POA: Diagnosis not present

## 2022-10-24 DIAGNOSIS — Z9011 Acquired absence of right breast and nipple: Secondary | ICD-10-CM | POA: Diagnosis not present

## 2022-10-24 DIAGNOSIS — N6092 Unspecified benign mammary dysplasia of left breast: Secondary | ICD-10-CM | POA: Diagnosis not present

## 2022-11-01 DIAGNOSIS — D0511 Intraductal carcinoma in situ of right breast: Secondary | ICD-10-CM | POA: Diagnosis not present

## 2022-11-04 DIAGNOSIS — I10 Essential (primary) hypertension: Secondary | ICD-10-CM | POA: Diagnosis not present

## 2022-11-15 DIAGNOSIS — I10 Essential (primary) hypertension: Secondary | ICD-10-CM | POA: Diagnosis not present

## 2022-12-02 DIAGNOSIS — Z9011 Acquired absence of right breast and nipple: Secondary | ICD-10-CM | POA: Diagnosis not present

## 2022-12-02 DIAGNOSIS — D0511 Intraductal carcinoma in situ of right breast: Secondary | ICD-10-CM | POA: Diagnosis not present

## 2022-12-02 DIAGNOSIS — C50911 Malignant neoplasm of unspecified site of right female breast: Secondary | ICD-10-CM | POA: Diagnosis not present

## 2022-12-09 ENCOUNTER — Encounter (HOSPITAL_COMMUNITY): Payer: Self-pay

## 2022-12-09 ENCOUNTER — Ambulatory Visit (HOSPITAL_COMMUNITY)
Admission: RE | Admit: 2022-12-09 | Discharge: 2022-12-09 | Disposition: A | Discharge status: Home / Self Care | Payer: BC Managed Care – PPO | Source: Ambulatory Visit | Attending: Hematology and Oncology | Admitting: Hematology and Oncology

## 2022-12-09 DIAGNOSIS — R918 Other nonspecific abnormal finding of lung field: Secondary | ICD-10-CM | POA: Diagnosis not present

## 2022-12-09 DIAGNOSIS — Z86000 Personal history of in-situ neoplasm of breast: Secondary | ICD-10-CM | POA: Diagnosis not present

## 2022-12-09 DIAGNOSIS — D0511 Intraductal carcinoma in situ of right breast: Secondary | ICD-10-CM

## 2022-12-09 DIAGNOSIS — D259 Leiomyoma of uterus, unspecified: Secondary | ICD-10-CM | POA: Diagnosis not present

## 2022-12-09 DIAGNOSIS — K769 Liver disease, unspecified: Secondary | ICD-10-CM | POA: Diagnosis not present

## 2022-12-09 DIAGNOSIS — I700 Atherosclerosis of aorta: Secondary | ICD-10-CM | POA: Diagnosis not present

## 2022-12-09 MED ORDER — SODIUM CHLORIDE (PF) 0.9 % IJ SOLN
INTRAMUSCULAR | Status: AC
  Filled 2022-12-09: qty 50

## 2022-12-09 MED ORDER — IOHEXOL 300 MG/ML  SOLN
100.0000 mL | Freq: Once | INTRAMUSCULAR | Status: AC | PRN
  Administered 2022-12-09: 100 mL via INTRAVENOUS

## 2022-12-12 ENCOUNTER — Other Ambulatory Visit: Payer: Self-pay

## 2022-12-12 ENCOUNTER — Inpatient Hospital Stay: Payer: BC Managed Care – PPO | Attending: Hematology and Oncology | Admitting: Hematology and Oncology

## 2022-12-12 VITALS — BP 137/92 | HR 83 | Temp 97.5°F | Resp 17 | Wt 172.1 lb

## 2022-12-12 DIAGNOSIS — Z79899 Other long term (current) drug therapy: Secondary | ICD-10-CM | POA: Insufficient documentation

## 2022-12-12 DIAGNOSIS — D0511 Intraductal carcinoma in situ of right breast: Secondary | ICD-10-CM | POA: Diagnosis not present

## 2022-12-12 DIAGNOSIS — K769 Liver disease, unspecified: Secondary | ICD-10-CM | POA: Diagnosis not present

## 2022-12-12 DIAGNOSIS — Z9011 Acquired absence of right breast and nipple: Secondary | ICD-10-CM | POA: Insufficient documentation

## 2022-12-12 DIAGNOSIS — C9200 Acute myeloblastic leukemia, not having achieved remission: Secondary | ICD-10-CM | POA: Diagnosis not present

## 2022-12-12 DIAGNOSIS — Z923 Personal history of irradiation: Secondary | ICD-10-CM | POA: Diagnosis not present

## 2022-12-12 DIAGNOSIS — Z79810 Long term (current) use of selective estrogen receptor modulators (SERMs): Secondary | ICD-10-CM | POA: Diagnosis not present

## 2022-12-12 NOTE — Progress Notes (Signed)
Patient Care Team: Joann Meadows, L.Joann Sa, MD as PCP - General (Family Medicine) Joann Germany, RN as Oncology Nurse Navigator Joann Kaufmann, RN as Oncology Nurse Navigator  DIAGNOSIS:  Encounter Diagnoses  Name Primary?   Ductal carcinoma in situ (DCIS) of right breast Yes   Liver disease     SUMMARY OF ONCOLOGIC HISTORY: Oncology History  Ductal carcinoma in situ (DCIS) of right breast  01/25/2019 Initial Diagnosis   Screening mammogram showed bilateral calcifications right breast 5 mm, pleomorphic.  Left breast 1.6 cm punctate, smaller group of calcifications spanning 3 mm.  Right lower outer quadrant biopsy revealed low-grade DCIS ER 90%, PR 70%; biopsy of left breast calcifications ALH and PASH   01/31/2019 Genetic Testing   ATM c.8495G>A and RECQL4 c.2587G>A VUS identified on the 9-gene STAT panel and Multi-cancer panel through [facility].  The STAT Breast cancer panel offered by [facility] includes sequencing and rearrangement analysis for the following 9 genes:  ATM, BRCA1, BRCA2, CDH1, CHEK2, PALB2, PTEN, STK11 and TP53.   The Multi-Gene Panel offered by [facility] includes sequencing and/or deletion duplication testing of the following 84 genes: AIP, ALK, APC, ATM, AXIN2,BAP1,  BARD1, BLM, BMPR1A, BRCA1, BRCA2, BRIP1, CASR, CDC73, CDH1, CDK4, CDKN1B, CDKN1C, CDKN2A (p14ARF), CDKN2A (p16INK4a), CEBPA, CHEK2, CTNNA1, DICER1, DIS3L2, EGFR (c.2369C>T, p.Thr790Met variant only), EPCAM (Deletion/duplication testing only), FH, FLCN, GATA2, GPC3, GREM1 (Promoter region deletion/duplication testing only), HOXB13 (c.251G>A, p.Gly84Glu), HRAS, KIT, MAX, MEN1, MET, MITF (c.952G>A, p.Glu318Lys variant only), MLH1, MSH2, MSH3, MSH6, MUTYH, NBN, NF1, NF2, NTHL1, PALB2, PDGFRA, PHOX2B, PMS2, POLD1, POLE, POT1, PRKAR1A, PTCH1, PTEN, RAD50, RAD51C, RAD51D, RB1, RECQL4, RET, RUNX1, SDHAF2, SDHA (sequence changes only), SDHB, SDHC, SDHD, SMAD4, SMARCA4, SMARCB1, SMARCE1, STK11, SUFU, TERC, TERT, TMEM127, TP53, TSC1,  TSC2, VHL, WRN and WT1.  he report date is February 02, 2019.   02/11/2019 Surgery   RT lumpectomy: DICS Intermediate grade, Margins Neg, ER 90%, PR 70%; Lt Lumpectomy: UDH   02/26/2019 -  Anti-estrogen oral therapy   Tamoxifen '20mg'$  daily, stopped because of depression and facial numbness   05/20/2019 - 06/17/2019 Radiation Therapy   Adjuvant XRT   08/15/2021 Relapse/Recurrence   MRI surveillance detected Right breast calcifications 5 cm, Ant biopsy: IG DCIS with necrosis, ER 40%, PR 0%, Posterior biopsy: Intermediate grade DCIS with [facility]   08/19/2022 Cancer Staging   Staging form: Breast, AJCC 8th Edition - Clinical: Stage IA (cT76m, cN0, cM0, G3, ER-, PR-, HER2+) - Signed by GNicholas Lose MD on 08/19/2022 Histologic grading system: 3 grade system   [place] myeloid [place]  09/1998 Initial Diagnosis   AML (acute myeloid leukemia) ([place] treated with 2 induction regimens followed by 6 consolidation regimens, remission     CHIEF COMPLIANT: Follow-up of right breast DCIS    INTERVAL HISTORY: LFelipa Larocheis a 47y.o. with above-mentioned history of right breast DCIS for which she underwent a lumpectomy. She presents to the clinic today for a follow-up. She reports that she had a reconstruction November 29.    ALLERGIES:  is allergic to tamoxifen citrate.  MEDICATIONS:  Current Outpatient Medications  Medication Sig Dispense Refill   azithromycin (ZITHROMAX) 250 MG tablet Take by mouth.     gabapentin (NEURONTIN) 100 MG capsule Take by mouth.     hydrochlorothiazide (HYDRODIURIL) 12.5 MG tablet Take 12.5 mg by mouth every morning.     hydrochlorothiazide (HYDRODIURIL) 25 MG tablet TAKE 1 TABLET BY MOUTH EVERY DAY IN THE MORNING FOR 30 DAYS ONCE A DAY  ibuprofen (ADVIL) 200 MG tablet Take 2 tablets by mouth every 12 (twelve) hours as needed.     mupirocin ointment (BACTROBAN) 2 % Apply topically.     ondansetron (ZOFRAN-ODT) 4 MG disintegrating tablet Take 4 mg by mouth  every 8 (eight) hours as needed.     oxyCODONE (OXY IR/ROXICODONE) 5 MG immediate release tablet Take by mouth.     Polyethyl Glycol-Propyl Glycol 0.4-0.3 % SOLN Apply 2 drops to eye in the morning, at noon, in the evening, and at bedtime.     triamcinolone (KENALOG) 0.025 % ointment Apply 1 application topically 2 (two) times daily. 30 g 0   amLODipine (NORVASC) 5 MG tablet TAKE 1 TABLET BY MOUTH EVERY DAY FOR 30 DAYS     Cholecalciferol (D 1000) 25 MCG (1000 UT) capsule Take by mouth.     No current facility-administered medications for this visit.    PHYSICAL EXAMINATION: ECOG PERFORMANCE STATUS: 1 - Symptomatic but completely ambulatory  Vitals:   12/12/22 0921  BP: (!) 137/92  Pulse: 83  Resp: 17  Temp: (!) 97.5 F (36.4 C)  SpO2: 99%   Filed Weights   12/12/22 0921  Weight: 172 lb 1 oz (78 kg)      LABORATORY DATA:  I have reviewed the data as listed    Latest Ref Rng & Units 10/27/2019    3:28 PM 06/16/2019    2:38 PM 06/15/2019    3:35 PM  CMP  Glucose 70 - 99 mg/dL 96  117  111   BUN 6 - 20 mg/dL '19  14  15   '$ Creatinine 0.44 - 1.00 mg/dL 0.84  0.82  0.70   Sodium 135 - 145 mmol/L 141  142  143   Potassium 3.5 - 5.1 mmol/L 3.9  4.4  3.9   Chloride 98 - 111 mmol/L 107  109  109   CO2 22 - 32 mmol/L 25  24    Calcium 8.9 - 10.3 mg/dL 9.1  9.1    Total Protein 6.5 - 8.1 g/dL 7.2  6.9    Total Bilirubin 0.3 - 1.2 mg/dL 0.3  0.4    Alkaline Phos 38 - 126 U/L 59  45    AST 15 - 41 U/L 13  11    ALT 0 - 44 U/L 15  12      Lab Results  Component Value Date   WBC 6.7 10/27/2019   HGB 13.1 10/27/2019   HCT 40.2 10/27/2019   MCV 91.8 10/27/2019   PLT 220 10/27/2019   NEUTROABS 4.2 10/27/2019    ASSESSMENT & PLAN:  Ductal carcinoma in situ (DCIS) of right breast 02/11/19: RT lumpectomy: DICS Intermediate grade, Margins Neg, ER 90%, PR 70%; Lt Lumpectomy: UDH status post radiation, could not tolerate tamoxifen recurrence: 08/15/2021:MRI surveillance detected  Right breast calcifications 5 cm, Ant biopsy: IG DCIS with necrosis, ER 40%, PR 0%, Posterior biopsy: Intermediate grade DCIS with [facility]  11/29/2021: Right mastectomy: High-grade DCIS with microinvasion, ER 0%, PR 0%, HER2 positive, 0/1 lymph node negative  T1c MIC, N0: Stage Ia   Patient had second opinion at [place] with Dr. Harden Mo who agreed with her plan of no systemic chemotherapy   Breast cancer surveillance: Left breast mammogram and MRIs 1.  Mammogram 08/22/2022: Left breast benign breast density category B  2.  CT CAP 12/09/2022: Multiple liver lesions indicative of cysts and hemangiomas new right hepatic lobe lesion favored to be hemangioma otherwise no evidence of metastatic  disease.  Radiology recommended an MRI of the liver.   There was not pathology enough material to run [place] blood test.   Right breast reconstruction November 2023 at [facility]: Left breast mammoplasty revealed ADH and ALH (patient could not tolerate tamoxifen twice so far and does not want to take it at this time) I believe her risk of breast cancer because of the [facility] and ADH is probably around 20 to 25% (because she had mastectomy and breast reduction)   I will call her with the result of the liver MRI. Return to clinic in 6 months for follow-up    Orders Placed This Encounter  Procedures   MR LIVER W WO CONTRAST    Standing Status:   Future    Standing Expiration Date:   12/13/2023    Order Specific Question:   If indicated for the ordered procedure, I authorize the administration of contrast media per Radiology protocol    Answer:   Yes    Order Specific Question:   What is the patient's sedation requirement?    Answer:   No Sedation    Order Specific Question:   Does the patient have a pacemaker or implanted devices?    Answer:   No    Order Specific Question:   Release to patient    Answer:   Immediate    Order Specific Question:   Preferred imaging location?    Answer:   [facility] (table  limit-550lbs)   The patient has a good understanding of the overall plan. she agrees with it. she will call with any problems that may develop before the next visit here. Total time spent: 30 mins including face to face time and time spent for planning, charting and co-ordination of care   Harriette Ohara, MD 12/12/22    I Gardiner Coins am acting as a Education administrator for [employer]  I have reviewed the above documentation for accuracy and completeness, and I agree with the above.

## 2022-12-12 NOTE — Assessment & Plan Note (Addendum)
02/11/19: RT lumpectomy: DICS Intermediate grade, Margins Neg, ER 90%, PR 70%; Lt Lumpectomy: UDH status post radiation, could not tolerate tamoxifen recurrence: 08/15/2021:MRI surveillance detected Right breast calcifications 5 cm, Ant biopsy: IG DCIS with necrosis, ER 40%, PR 0%, Posterior biopsy: Intermediate grade DCIS with [facility]  11/29/2021: Right mastectomy: High-grade DCIS with microinvasion, ER 0%, PR 0%, HER2 positive, 0/1 lymph node negative  T1c MIC, N0: Stage Ia   Patient had second opinion at [place] with Dr. Harden Mo who agreed with her plan of no systemic chemotherapy   Breast cancer surveillance: Left breast mammogram and MRIs 1.  Mammogram 08/22/2022: Left breast benign breast density category B  2.  CT CAP 12/09/2022: Multiple liver lesions indicative of cysts and hemangiomas new right hepatic lobe lesion favored to be hemangioma otherwise no evidence of metastatic disease.  Radiology recommended an MRI of the liver.   There was not pathology enough material to run [place] blood test.   Right breast reconstruction November 2023 at [facility]: Left breast mammoplasty revealed ADH and ALH (patient could not tolerate tamoxifen twice so far and does not want to take it at this time) I believe her risk of breast cancer because of the [facility] and ADH is probably around 20 to 25% (because she had mastectomy and breast reduction)   I will call her with the result of the liver MRI. Return to clinic in 6 months for follow-up

## 2022-12-16 ENCOUNTER — Ambulatory Visit (HOSPITAL_COMMUNITY): Payer: BC Managed Care – PPO

## 2022-12-24 ENCOUNTER — Ambulatory Visit: Payer: BC Managed Care – PPO | Admitting: Rehabilitation

## 2022-12-30 DIAGNOSIS — D0511 Intraductal carcinoma in situ of right breast: Secondary | ICD-10-CM | POA: Diagnosis not present

## 2022-12-30 DIAGNOSIS — Z9011 Acquired absence of right breast and nipple: Secondary | ICD-10-CM | POA: Diagnosis not present

## 2023-01-07 ENCOUNTER — Ambulatory Visit
Admission: RE | Admit: 2023-01-07 | Discharge: 2023-01-07 | Disposition: A | Discharge status: Home / Self Care | Payer: BC Managed Care – PPO | Source: Ambulatory Visit | Attending: Hematology and Oncology | Admitting: Hematology and Oncology

## 2023-01-07 ENCOUNTER — Ambulatory Visit: Payer: BC Managed Care – PPO | Attending: Medical Oncology | Admitting: Rehabilitation

## 2023-01-07 ENCOUNTER — Other Ambulatory Visit: Payer: Self-pay

## 2023-01-07 ENCOUNTER — Encounter: Payer: Self-pay | Admitting: Rehabilitation

## 2023-01-07 DIAGNOSIS — M25612 Stiffness of left shoulder, not elsewhere classified: Secondary | ICD-10-CM | POA: Insufficient documentation

## 2023-01-07 DIAGNOSIS — M25611 Stiffness of right shoulder, not elsewhere classified: Secondary | ICD-10-CM | POA: Diagnosis not present

## 2023-01-07 DIAGNOSIS — K769 Liver disease, unspecified: Secondary | ICD-10-CM

## 2023-01-07 DIAGNOSIS — G8918 Other acute postprocedural pain: Secondary | ICD-10-CM | POA: Diagnosis not present

## 2023-01-07 DIAGNOSIS — Z9189 Other specified personal risk factors, not elsewhere classified: Secondary | ICD-10-CM | POA: Insufficient documentation

## 2023-01-07 DIAGNOSIS — N281 Cyst of kidney, acquired: Secondary | ICD-10-CM | POA: Diagnosis not present

## 2023-01-07 DIAGNOSIS — K7689 Other specified diseases of liver: Secondary | ICD-10-CM | POA: Diagnosis not present

## 2023-01-07 DIAGNOSIS — Z483 Aftercare following surgery for neoplasm: Secondary | ICD-10-CM | POA: Diagnosis not present

## 2023-01-07 MED ORDER — GADOPICLENOL 0.5 MMOL/ML IV SOLN
7.5000 mL | Freq: Once | INTRAVENOUS | Status: AC | PRN
  Administered 2023-01-07: 7.5 mL via INTRAVENOUS

## 2023-01-07 NOTE — Therapy (Unsigned)
OUTPATIENT PHYSICAL THERAPY ONCOLOGY EVALUATION  Patient Name: Joann Meadows MRN: [id number] DOB:19-Jul-1976, 47 y.o., female Today's Date: 01/07/2023   PT End of Session - 01/07/23 2118     Visit Number 1    Number of Visits 13    Date for PT Re-Evaluation 02/18/23    PT Start Time 0804    PT Stop Time 0856    PT Time Calculation (min) 52 min    Activity Tolerance Patient tolerated treatment well    Behavior During Therapy [facility] for tasks assessed/performed                 Past Medical History:  Diagnosis Date   Blood transfusion without reported diagnosis    leaukemia 18 years ago   Breast cancer ([place]) 2020   Right Breast Cancer   Endometriosis    Family history of colon cancer    Family history of leukemia    Fibroid    Hx of radiation therapy 06/2019   Leukemia ([place])    20 years ago   Personal history of radiation therapy 2020   Right Breast Cancer   PONV (postoperative nausea and vomiting)    pt states she vomits after colonoscopies   Past Surgical History:  Procedure Laterality Date   BREAST EXCISIONAL BIOPSY Left 02/11/2019   [facility]   BREAST LUMPECTOMY Right 02/11/2019   BREAST LUMPECTOMY WITH RADIOACTIVE SEED LOCALIZATION Bilateral 02/11/2019   Procedure: BILATERAL BREAST LUMPECTOMIES WITH BILATERAL RADIOACTIVE SEED LOCALIZATION;  Surgeon: Donnie Mesa, MD;  Location: [facility];  Service: General;  Laterality: Bilateral;   MASTECTOMY Right 11/2021   Patient Active Problem List   Diagnosis Date Noted   COPD possible, would be Gold Group B 11/25/2019   DOE (dyspnea on exertion) 11/24/2019   Genetic testing 02/02/2019   Ductal carcinoma in situ (DCIS) of right breast 01/25/2019   AML (acute myeloid leukemia) ([place]) 01/25/2019   Family history of colon cancer    Family history of leukemia    Delivery normal 03/25/2017    PCP: [name], L.Marlou Sa, MD  REFERRING PROVIDER: Minerva Fester, NP  REFERRING DIAG: Rt breast  cancer  THERAPY DIAG:  Stiffness of right shoulder, not elsewhere classified  At risk for lymphedema  Aftercare following surgery for neoplasm  Stiffness of left shoulder, not elsewhere classified  Post-mastectomy pain  ONSET DATE: 11/29/21  SUBJECTIVE                                                                                                                                                                                           SUBJECTIVE STATEMENT:  Since I had  my exchange I feel like I lost my motion again.  It was pretty good and then it started to get a little more stiff.  Especially when I clean.  Going to get an [place] on the breast and an MRI for the liver.   PERTINENT HISTORY:  Leukemia history, previous Rt breast lumpectomy and radiation in 2020. Rt mastectomy with expander placement and SLNB 0/1 LN positive on 11/29/21.  Reduction on the left and switch to implant on 10/23/22.   PAIN:  Are you having pain? YES 4/10 I have been sore this week along the Rt side.  New this week It is usually from the right lateral breast towards the shoulder blade and pain at the center of the chest (near the sternum) It increases with driving, sitting, laying down  Decreases with heat / warm shower  PRECAUTIONS: Other: lymphedema risk Rt UE  OCCUPATION: stay at home mom.  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 I have help.   OBJECTIVE OBSERVATION: Cording noted in Rt axilla and in seated along lateral breast  PALPATION: Rt: +2 ttp Rt latissimus/ lateral chest, rhomboids Lt: +1-2 ttp latissimus and pectoralis borders  UPPER EXTREMITY AROM/PROM: A/PROM RIGHT    02/22/22 03/22/22 04/05/22 01/07/23  Shoulder extension 45 45 50  20 - back of shoulder pain  Shoulder flexion 150 - tight in axilla  165 - tight in axilla  167 - pull in axilla  167- pull in the axilla  152 -pull Rt axilla and into breast  Shoulder abduction 160- tight in axilla  165 - tight in pectoralis  167 -  pull in axilla 167 - pull in lat not axilla  160- pull in mid upper arm and lat muscle   Shoulder internal rotation 70    65 - pn top of shoulder   Shoulder external rotation 90    88    (Blank rows = not tested)  A/PROM LEFT   01/07/23  Shoulder extension 30 30  Shoulder flexion 175 162  Shoulder abduction 164 165  Shoulder internal rotation  50  Shoulder external rotation 105 85    (Blank rows = not tested)  Quick DASH: 45%  UQS  Dermatome Myotome Reflex  C4 equal    C5 Decreased on Rt 4-/5 bil   C6  equal Elbow flexion: 4+/5 bil Wrist extension: 4-/5 Lt, 4/5 Rt   C7 equal 4/5 bil   C8 Decreased Rt 4-/5 Lt, 4/5 Rt   T1 Decreased Rt 4-/5 Lt, 4/5 Rt    Grip strength: Rt: 30pounds,  Lt: 35pounds TODAY'S TREATMENT  01/07/23: Evaluation performed Updated HEP with review of each per below   PATIENT EDUCATION:  Education details: POC, updated HEP Person educated: Patient Education method: Consulting civil engineer, Demonstration, Verbal cues, and Handouts Education comprehension: verbalized understanding and returned demonstration   HOME EXERCISE PROGRAM: Access code did not copy Supine Shoulder Flexion Extension AAROM with [place] - 1-2 x daily - 7 x weekly - 10 reps - 5 seconds hold  Tennis ball release of rhomboids and lat Doorway stretch bil Latissimus wall stretch   ASSESSMENT: CLINICAL IMPRESSION:   Pt returns after implant switch and Left sided reduction.  Pt has lost AROM compared to d/c during last POC bilaterally.  She is having muscular pain, trigger points, and stiffness on the Rt side in the UT, latissimus, rhomboids, and pectoralis along with some cording in the axilla and lateral breast noted in seated vs supine.  She is  also having some weakness and forearm/elbow region pain on the left arm since surgery which pt noted has been there since waking up and was mentioned may be from arm positioning or nerve traction per MD.  Discussed DN as this may be beneficial as well as POC.     GOALS: Goals reviewed with patient? Yes  STGs = LONG TERM GOALS:  Pt will improve UE flexion AROM to at least 160 to improve mobility Baseline: 150 Target date: 02/18/23 Goal status: INITIAL  2.  Pt will improve Lt UE strength to equal to the opposite side to allow for improved functional strength Baseline:  Target date: 02/18/23 Goal status: INITIAL  3. Pt will report pain at rest of 1/10 or less on the Rt lateral trunk  INITIAL  GOAL: 02/18/23 PLAN: PT FREQUENCY: 1-2x per week  PT DURATION: 6 more weeks  PLANNED INTERVENTIONS: Therapeutic exercises, Patient/Family education, and Manual therapy, Dry Needling, modalities as needed  PLAN FOR NEXT SESSION: Rt UE PROM with STM release to UT, lat, pect, rhomboids, cording release breast and axilla,  Lt: PROM and STM as needed, bil UE strengthening, Do Ander Slade, PT 01/07/2023, 9:21 PM

## 2023-01-08 ENCOUNTER — Telehealth: Payer: Self-pay | Admitting: Hematology and Oncology

## 2023-01-08 ENCOUNTER — Other Ambulatory Visit: Payer: Self-pay | Admitting: Hematology and Oncology

## 2023-01-08 DIAGNOSIS — K769 Liver disease, unspecified: Secondary | ICD-10-CM

## 2023-01-08 NOTE — Telephone Encounter (Signed)
The MRI of the abdomen was suggesting some rim enhancing lesions in the liver that were concerning for metastatic disease and therefore they recommended a PET CT scan.  I will order the PET CT scan.  I instructed her to call me back if she has any questions or concerns. I will see her back after the PET scan to discuss results

## 2023-01-08 NOTE — Telephone Encounter (Signed)
Reached out to patient to schedule follow up after scans, patient aware and confirmed.

## 2023-01-08 NOTE — Progress Notes (Signed)
Liver lesions: New rim-enhancing lesions: We will obtain a PET CT scan for further evaluation as per the recommendations from radiology

## 2023-01-08 NOTE — Telephone Encounter (Signed)
Per 2/14 IB reached out to patient to schedule 3 day follow up with [place] after PET scan, paitent hasn't set up appointment yet and will reach out once its scheduled.

## 2023-01-14 ENCOUNTER — Ambulatory Visit: Payer: BC Managed Care – PPO | Admitting: Rehabilitation

## 2023-01-14 ENCOUNTER — Encounter: Payer: Self-pay | Admitting: Rehabilitation

## 2023-01-14 DIAGNOSIS — M25612 Stiffness of left shoulder, not elsewhere classified: Secondary | ICD-10-CM | POA: Diagnosis not present

## 2023-01-14 DIAGNOSIS — Z483 Aftercare following surgery for neoplasm: Secondary | ICD-10-CM | POA: Diagnosis not present

## 2023-01-14 DIAGNOSIS — M25611 Stiffness of right shoulder, not elsewhere classified: Secondary | ICD-10-CM | POA: Diagnosis not present

## 2023-01-14 DIAGNOSIS — F419 Anxiety disorder, unspecified: Secondary | ICD-10-CM | POA: Diagnosis not present

## 2023-01-14 DIAGNOSIS — Z9189 Other specified personal risk factors, not elsewhere classified: Secondary | ICD-10-CM | POA: Diagnosis not present

## 2023-01-14 DIAGNOSIS — G8918 Other acute postprocedural pain: Secondary | ICD-10-CM

## 2023-01-14 DIAGNOSIS — K769 Liver disease, unspecified: Secondary | ICD-10-CM | POA: Diagnosis not present

## 2023-01-14 NOTE — Therapy (Signed)
OUTPATIENT PHYSICAL THERAPY ONCOLOGY TREATMENT  Patient Name: Joann Meadows MRN: [id number] DOB:1976-06-04, 47 y.o., female Today's Date: 01/14/2023   PT End of Session - 01/14/23 1200     Visit Number 2    Number of Visits 13    Date for PT Re-Evaluation 02/18/23    PT Start Time 1200    PT Stop Time [id number]    PT Time Calculation (min) 55 min    Activity Tolerance Patient tolerated treatment well    Behavior During Therapy [facility] for tasks assessed/performed                 Past Medical History:  Diagnosis Date   Blood transfusion without reported diagnosis    leaukemia 18 years ago   Breast cancer ([place]) 2020   Right Breast Cancer   Endometriosis    Family history of colon cancer    Family history of leukemia    Fibroid    Hx of radiation therapy 06/2019   Leukemia ([place])    20 years ago   Personal history of radiation therapy 2020   Right Breast Cancer   PONV (postoperative nausea and vomiting)    pt states she vomits after colonoscopies   Past Surgical History:  Procedure Laterality Date   BREAST EXCISIONAL BIOPSY Left 02/11/2019   [facility]   BREAST LUMPECTOMY Right 02/11/2019   BREAST LUMPECTOMY WITH RADIOACTIVE SEED LOCALIZATION Bilateral 02/11/2019   Procedure: BILATERAL BREAST LUMPECTOMIES WITH BILATERAL RADIOACTIVE SEED LOCALIZATION;  Surgeon: Donnie Mesa, MD;  Location: [place];  Service: General;  Laterality: Bilateral;   MASTECTOMY Right 11/2021   Patient Active Problem List   Diagnosis Date Noted   COPD possible, would be Gold Group B 11/25/2019   DOE (dyspnea on exertion) 11/24/2019   Genetic testing 02/02/2019   Ductal carcinoma in situ (DCIS) of right breast 01/25/2019   AML (acute myeloid leukemia) ([place]) 01/25/2019   Family history of colon cancer    Family history of leukemia    Delivery normal 03/25/2017    PCP: [name], L.Marlou Sa, MD  REFERRING PROVIDER: Minerva Fester, NP  REFERRING DIAG: Rt breast  cancer  THERAPY DIAG:  Stiffness of right shoulder, not elsewhere classified  Stiffness of left shoulder, not elsewhere classified  Aftercare following surgery for neoplasm  Post-mastectomy pain  At risk for lymphedema  ONSET DATE: 11/29/21  SUBJECTIVE                                                                                                                                                                                           SUBJECTIVE STATEMENT: They are finding moderately  suspicious metastatic liver lesions. I will have a PET scan on 01/21/23. I am worried that the pain is from the liver  EVAL: Since I had my exchange I feel like I lost my motion again.  It was pretty good and then it started to get a little more stiff.  Especially when I clean.  Going to get an [place] on the breast and an MRI for the liver.   PERTINENT HISTORY:  Leukemia history, previous Rt breast lumpectomy and radiation in 2020. Rt mastectomy with expander placement and SLNB 0/1 LN positive on 11/29/21.  Reduction on the left and switch to implant on 10/23/22.   PAIN:  Are you having pain? 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 I have help.   OBJECTIVE OBSERVATION: Cording noted in Rt axilla and in seated along lateral breast  PALPATION: Rt: +2 ttp Rt latissimus/ lateral chest, rhomboids Lt: +1-2 ttp latissimus and pectoralis borders  UPPER EXTREMITY AROM/PROM: A/PROM RIGHT    02/22/22 03/22/22 04/05/22 01/07/23  Shoulder extension 45 45 50  20 - back of shoulder pain  Shoulder flexion 150 - tight in axilla  165 - tight in axilla  167 - pull in axilla  167- pull in the axilla  152 -pull Rt axilla and into breast  Shoulder abduction 160- tight  in axilla  165 - tight in pectoralis  167 - pull in axilla 167 - pull in lat not axilla  160- pull in mid upper arm and lat muscle   Shoulder internal rotation 70    65 - pn top of shoulder   Shoulder external rotation 90    88    (Blank rows = not tested)  A/PROM LEFT   01/07/23  Shoulder extension 30 30  Shoulder flexion 175 162  Shoulder abduction 164 165  Shoulder internal rotation  50  Shoulder external rotation 105 85    (Blank rows = not tested)  Quick DASH: 45%  UQS  Dermatome Myotome Reflex  C4 equal    C5 Decreased on Rt 4-/5 bil   C6  equal Elbow flexion: 4+/5 bil Wrist extension: 4-/5 Lt, 4/5 Rt   C7 equal 4/5 bil   C8 Decreased Rt 4-/5 Lt, 4/5 Rt   T1 Decreased Rt 4-/5 Lt, 4/5 Rt    Grip strength: Rt: 30pounds,  Lt: 35pounds TODAY'S TREATMENT  01/14/23: Discussion of new CT findings and pain x 14mn STM Rt: pectoralis, UT, latissimus, and axilla in supine and QL, paraspinals, latissimus in sideyling.  Pinning, PROM, stretched positions included with STM.  LTR with goalpost arms 2x15sec bil Piriformis stretch and KTOS education x 15" each These added to HEP Lengthening opposing forces/opposite hands pressure at hip and scapula in sidelying Bil shoulder depression holds  01/07/23: Evaluation performed Updated HEP with review of each per below   PATIENT EDUCATION:  Education details: POC, updated HEP Person educated: Patient Education method: EConsulting civil engineer Demonstration, Verbal cues, and Handouts Education comprehension: verbalized understanding and returned demonstration   HOME EXERCISE PROGRAM: Access code did not copy Supine Shoulder Flexion Extension AAROM with Dowel - 1-2 x daily - 7 x weekly - 10 reps - 5 seconds hold  Tennis ball release of rhomboids and lat Doorway  stretch bil Latissimus wall stretch   Access Code: [email or web address] URL: [email or web address] Date: 01/14/2023 Prepared by: Shan Levans  Exercises - Supine Lower Trunk  Rotation  - 1 x daily - 7 x weekly - 1-3 sets - 10 reps - 20-30 seconds hold - Supine Hamstring Stretch with Strap  - 1 x daily - 7 x weekly - 1-3 sets - 10 reps - 20-30 seconds hold - Supine Figure 4 Piriformis Stretch with Leg Extension  - 1 x daily - 7 x weekly - 1-3 sets - 10 reps - 20-30 seconds hold - Supine Piriformis Stretch with Leg Straight  - 1 x daily - 7 x weekly - 3 reps - 30 seconds  hold - Child's Pose Side Bend  - 1 x daily - 7 x weekly - 1-3 sets - 10 reps - 20-30 seconds hold  ASSESSMENT: CLINICAL IMPRESSION:   Pt received some bad news with her imaging concerning moderately suspicious liver lesions.  She will have PET scan for more information in a week.   Pt had been noting some more Rt QL region pain and is worried that this is from her liver.  Focused on stretching and STM to this area and opening up the Rt quadrant and pt noted much relief post MT.  Discussed that how if the pain was just from the liver it would not respond to MT and it seems more MSK, but that you never know for sure.    GOALS: Goals reviewed with patient? Yes  STGs = LONG TERM GOALS:  Pt will improve UE flexion AROM to at least 160 to improve mobility Baseline: 150 Target date: 02/18/23 Goal status: INITIAL  2.  Pt will improve Lt UE strength to equal to the opposite side to allow for improved functional strength Baseline:  Target date: 02/18/23 Goal status: INITIAL  3. Pt will report pain at rest of 1/10 or less on the Rt lateral trunk  INITIAL  GOAL: 02/18/23 PLAN: PT FREQUENCY: 1-2x per week  PT DURATION: 6 more weeks  PLANNED INTERVENTIONS: Therapeutic exercises, Patient/Family education, and Manual therapy, Dry Needling, modalities as needed  PLAN FOR NEXT SESSION: Rt upper quadrant and lumbar ROM with STM release to   Lt: PROM and STM as needed, bil UE strengthening, Do Ander Slade, PT 01/14/2023, 1:02 PM

## 2023-01-16 ENCOUNTER — Encounter: Payer: Self-pay | Admitting: Rehabilitation

## 2023-01-16 ENCOUNTER — Ambulatory Visit: Payer: BC Managed Care – PPO | Admitting: Rehabilitation

## 2023-01-16 DIAGNOSIS — G8918 Other acute postprocedural pain: Secondary | ICD-10-CM | POA: Diagnosis not present

## 2023-01-16 DIAGNOSIS — M25612 Stiffness of left shoulder, not elsewhere classified: Secondary | ICD-10-CM

## 2023-01-16 DIAGNOSIS — Z483 Aftercare following surgery for neoplasm: Secondary | ICD-10-CM | POA: Diagnosis not present

## 2023-01-16 DIAGNOSIS — Z9189 Other specified personal risk factors, not elsewhere classified: Secondary | ICD-10-CM

## 2023-01-16 DIAGNOSIS — M25611 Stiffness of right shoulder, not elsewhere classified: Secondary | ICD-10-CM | POA: Diagnosis not present

## 2023-01-16 NOTE — Therapy (Signed)
OUTPATIENT PHYSICAL THERAPY ONCOLOGY TREATMENT  Patient Name: Perel Alphin MRN: [id number] DOB:03/16/1976, 47 y.o., female Today's Date: 01/16/2023   PT End of Session - 01/16/23 1159     Visit Number 3    Number of Visits 13    Date for PT Re-Evaluation 02/18/23    PT Start Time 1200    PT Stop Time [id number]    PT Time Calculation (min) 55 min    Activity Tolerance Patient tolerated treatment well    Behavior During Therapy [facility] for tasks assessed/performed                 Past Medical History:  Diagnosis Date   Blood transfusion without reported diagnosis    leaukemia 18 years ago   Breast cancer ([place]) 2020   Right Breast Cancer   Endometriosis    Family history of colon cancer    Family history of leukemia    Fibroid    Hx of radiation therapy 06/2019   Leukemia ([place])    20 years ago   Personal history of radiation therapy 2020   Right Breast Cancer   PONV (postoperative nausea and vomiting)    pt states she vomits after colonoscopies   Past Surgical History:  Procedure Laterality Date   BREAST EXCISIONAL BIOPSY Left 02/11/2019   [facility]   BREAST LUMPECTOMY Right 02/11/2019   BREAST LUMPECTOMY WITH RADIOACTIVE SEED LOCALIZATION Bilateral 02/11/2019   Procedure: BILATERAL BREAST LUMPECTOMIES WITH BILATERAL RADIOACTIVE SEED LOCALIZATION;  Surgeon: Donnie Mesa, MD;  Location: [place];  Service: General;  Laterality: Bilateral;   MASTECTOMY Right 11/2021   Patient Active Problem List   Diagnosis Date Noted   COPD possible, would be Gold Group B 11/25/2019   DOE (dyspnea on exertion) 11/24/2019   Genetic testing 02/02/2019   Ductal carcinoma in situ (DCIS) of right breast 01/25/2019   AML (acute myeloid leukemia) ([place]) 01/25/2019   Family history of colon cancer    Family history of leukemia    Delivery normal 03/25/2017    PCP: [name], L.Marlou Sa, MD  REFERRING PROVIDER: Minerva Fester, NP  REFERRING DIAG: Rt breast  cancer  THERAPY DIAG:  Stiffness of right shoulder, not elsewhere classified  Stiffness of left shoulder, not elsewhere classified  Aftercare following surgery for neoplasm  Post-mastectomy pain  At risk for lymphedema  ONSET DATE: 11/29/21  SUBJECTIVE                                                                                                                                                                                           SUBJECTIVE STATEMENT: When I wake up  in the morning I am okay then it starts hurting.  I felt better last time until the evening.    EVAL: Since I had my exchange I feel like I lost my motion again.  It was pretty good and then it started to get a little more stiff.  Especially when I clean.  Going to get an [place] on the breast and an MRI for the liver.   PERTINENT HISTORY:  Leukemia history, previous Rt breast lumpectomy and radiation in 2020. Rt mastectomy with expander placement and SLNB 0/1 LN positive on 11/29/21.  Reduction on the left and switch to implant on 10/23/22.   PAIN:  Are you having pain? 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 I have help.   OBJECTIVE OBSERVATION: Cording noted in Rt axilla and in seated along lateral breast  PALPATION: Rt: +2 ttp Rt latissimus/ lateral chest, rhomboids Lt: +1-2 ttp latissimus and pectoralis borders  UPPER EXTREMITY AROM/PROM: A/PROM RIGHT    02/22/22 03/22/22 04/05/22 01/07/23  Shoulder extension 45 45 50  20 - back of shoulder pain  Shoulder flexion 150 - tight in axilla  165 - tight in axilla  167 - pull in axilla  167- pull in the axilla  152 -pull Rt axilla and into breast  Shoulder abduction 160- tight in axilla  165 - tight in  pectoralis  167 - pull in axilla 167 - pull in lat not axilla  160- pull in mid upper arm and lat muscle   Shoulder internal rotation 70    65 - pn top of shoulder   Shoulder external rotation 90    88    (Blank rows = not tested)  A/PROM LEFT   01/07/23  Shoulder extension 30 30  Shoulder flexion 175 162  Shoulder abduction 164 165  Shoulder internal rotation  50  Shoulder external rotation 105 85    (Blank rows = not tested)  Quick DASH: 45%  UQS  Dermatome Myotome Reflex  C4 equal    C5 Decreased on Rt 4-/5 bil   C6  equal Elbow flexion: 4+/5 bil Wrist extension: 4-/5 Lt, 4/5 Rt   C7 equal 4/5 bil   C8 Decreased Rt 4-/5 Lt, 4/5 Rt   T1 Decreased Rt 4-/5 Lt, 4/5 Rt    Grip strength: Rt: 30pounds,  Lt: 35pounds TODAY'S TREATMENT  01/16/23: STM Rt: pectoralis, UT, latissimus, and axilla in supine and QL, paraspinals, latissimus in sideyling.  Pinning, PROM, stretched positions included with STM.  LTR with goalpost arms 2x15sec bil Lengthening opposing forces/opposite hands pressure at hip and scapula in sidelying Bil shoulder depression holds Discussed daily habit which include lifting her 49 year old up from bed to the bathroom in the morning and how she could try a trial of not doing this a few days.  Also showed her a lumbar roll for driving Supine hooklying TrA activation assessment with pt using bearing down and diaphragm for activation.  Discussed pelvic floor and pt mentioned she is having worsening urinary and fecal incontience recently.  Discussion about link between PF and fore, and possible pain and weakness from mastectomy.  Pt would like to do a pelvic referral but would like to wait and see  what her scans say next week.   Showed her TrA with pillow squeeze for adductor/PF  Taping I strip along pain at latissimus from axilla to midspine with instruction on removal  01/14/23: Discussion of new CT findings and pain x 75mn STM Rt: pectoralis, UT, latissimus, and axilla  in supine and QL, paraspinals, latissimus in sideyling.  Pinning, PROM, stretched positions included with STM.  LTR with goalpost arms 2x15sec bil Piriformis stretch and KTOS education x 15" each These added to HEP Lengthening opposing forces/opposite hands pressure at hip and scapula in sidelying Bil shoulder depression holds  01/07/23: Evaluation performed Updated HEP with review of each per below   PATIENT EDUCATION:  Education details: POC, updated HEP Person educated: Patient Education method: EConsulting civil engineer Demonstration, Verbal cues, and Handouts Education comprehension: verbalized understanding and returned demonstration   HOME EXERCISE PROGRAM: Access code did not copy Supine Shoulder Flexion Extension AAROM with Dowel - 1-2 x daily - 7 x weekly - 10 reps - 5 seconds hold  Tennis ball release of rhomboids and lat Doorway stretch bil Latissimus wall stretch   Access Code: [email or web address]: [email or web address] Date: 01/14/2023 Prepared by: KShan Levans Exercises - Supine Lower Trunk Rotation  - 1 x daily - 7 x weekly - 1-3 sets - 10 reps - 20-30 seconds hold - Supine Hamstring Stretch with Strap  - 1 x daily - 7 x weekly - 1-3 sets - 10 reps - 20-30 seconds hold - Supine Figure 4 Piriformis Stretch with Leg Extension  - 1 x daily - 7 x weekly - 1-3 sets - 10 reps - 20-30 seconds hold - Supine Piriformis Stretch with Leg Straight  - 1 x daily - 7 x weekly - 3 reps - 30 seconds  hold - Child's Pose Side Bend  - 1 x daily - 7 x weekly - 1-3 sets - 10 reps - 20-30 seconds hold  ASSESSMENT: CLINICAL IMPRESSION:  Pt continues with QL and lat region pain after sitting for awhile but improved with MT and PT visits.  Also discovered she is having PF dysfunction and abdominal activation issues which may be causing some of this.  She has also been lifting her son in the morning.    GOALS: Goals reviewed with patient? Yes  STGs = LONG TERM GOALS:  Pt will improve UE  flexion AROM to at least 160 to improve mobility Baseline: 150 Target date: 02/18/23 Goal status: INITIAL  2.  Pt will improve Lt UE strength to equal to the opposite side to allow for improved functional strength Baseline:  Target date: 02/18/23 Goal status: INITIAL  3. Pt will report pain at rest of 1/10 or less on the Rt lateral trunk  INITIAL  GOAL: 02/18/23 PLAN: PT FREQUENCY: 1-2x per week  PT DURATION: 6 more weeks  PLANNED INTERVENTIONS: Therapeutic exercises, Patient/Family education, and Manual therapy, Dry Needling, modalities as needed  PLAN FOR NEXT SESSION: PELVIC FLOOR REFERRAL as able (after scan results) Rt upper quadrant and lumbar ROM with STM release to   Lt: PROM and STM as needed, bil UE strengthening, Do SAnder Slade PT 01/16/2023, 5:41 PM

## 2023-01-21 ENCOUNTER — Encounter (HOSPITAL_COMMUNITY)
Admission: RE | Admit: 2023-01-21 | Discharge: 2023-01-21 | Disposition: A | Discharge status: Home / Self Care | Payer: BC Managed Care – PPO | Source: Ambulatory Visit | Attending: Hematology and Oncology | Admitting: Hematology and Oncology

## 2023-01-21 ENCOUNTER — Encounter: Payer: Self-pay | Admitting: Rehabilitation

## 2023-01-21 ENCOUNTER — Ambulatory Visit: Payer: BC Managed Care – PPO | Admitting: Rehabilitation

## 2023-01-21 DIAGNOSIS — Z9189 Other specified personal risk factors, not elsewhere classified: Secondary | ICD-10-CM | POA: Diagnosis not present

## 2023-01-21 DIAGNOSIS — M25611 Stiffness of right shoulder, not elsewhere classified: Secondary | ICD-10-CM | POA: Diagnosis not present

## 2023-01-21 DIAGNOSIS — G8918 Other acute postprocedural pain: Secondary | ICD-10-CM | POA: Diagnosis not present

## 2023-01-21 DIAGNOSIS — Z483 Aftercare following surgery for neoplasm: Secondary | ICD-10-CM | POA: Diagnosis not present

## 2023-01-21 DIAGNOSIS — M25612 Stiffness of left shoulder, not elsewhere classified: Secondary | ICD-10-CM

## 2023-01-21 DIAGNOSIS — K769 Liver disease, unspecified: Secondary | ICD-10-CM | POA: Insufficient documentation

## 2023-01-21 DIAGNOSIS — C50919 Malignant neoplasm of unspecified site of unspecified female breast: Secondary | ICD-10-CM | POA: Diagnosis not present

## 2023-01-21 LAB — GLUCOSE, CAPILLARY: Glucose-Capillary: 92 mg/dL (ref 70–99)

## 2023-01-21 MED ORDER — FLUDEOXYGLUCOSE F - 18 (FDG) INJECTION
8.4000 | Freq: Once | INTRAVENOUS | Status: AC | PRN
  Administered 2023-01-21: 8.4 via INTRAVENOUS

## 2023-01-21 NOTE — Therapy (Signed)
OUTPATIENT PHYSICAL THERAPY ONCOLOGY TREATMENT  Patient Name: Joann Meadows MRN: [id number] DOB:09-Jul-1976, 47 y.o., female Today's Date: 01/21/2023   PT End of Session - 01/21/23 1207     Visit Number 4    Number of Visits 13    Date for PT Re-Evaluation 02/18/23    PT Start Time 1208    PT Stop Time 1255    PT Time Calculation (min) 47 min    Activity Tolerance Patient tolerated treatment well    Behavior During Therapy [facility] for tasks assessed/performed                 Past Medical History:  Diagnosis Date   Blood transfusion without reported diagnosis    leaukemia 18 years ago   Breast cancer ([place]) 2020   Right Breast Cancer   Endometriosis    Family history of colon cancer    Family history of leukemia    Fibroid    Hx of radiation therapy 06/2019   Leukemia ([place])    20 years ago   Personal history of radiation therapy 2020   Right Breast Cancer   PONV (postoperative nausea and vomiting)    pt states she vomits after colonoscopies   Past Surgical History:  Procedure Laterality Date   BREAST EXCISIONAL BIOPSY Left 02/11/2019   [facility]   BREAST LUMPECTOMY Right 02/11/2019   BREAST LUMPECTOMY WITH RADIOACTIVE SEED LOCALIZATION Bilateral 02/11/2019   Procedure: BILATERAL BREAST LUMPECTOMIES WITH BILATERAL RADIOACTIVE SEED LOCALIZATION;  Surgeon: Donnie Mesa, MD;  Location: [place];  Service: General;  Laterality: Bilateral;   MASTECTOMY Right 11/2021   Patient Active Problem List   Diagnosis Date Noted   COPD possible, would be Gold Group B 11/25/2019   DOE (dyspnea on exertion) 11/24/2019   Genetic testing 02/02/2019   Ductal carcinoma in situ (DCIS) of right breast 01/25/2019   AML (acute myeloid leukemia) ([place]) 01/25/2019   Family history of colon cancer    Family history of leukemia    Delivery normal 03/25/2017    PCP: Lujean Amel, MD  REFERRING PROVIDER: Minerva Fester, NP  REFERRING DIAG: Rt breast  cancer  THERAPY DIAG:  Stiffness of right shoulder, not elsewhere classified  Stiffness of left shoulder, not elsewhere classified  Aftercare following surgery for neoplasm  Post-mastectomy pain  At risk for lymphedema  ONSET DATE: 11/29/21  SUBJECTIVE                                                                                                                                                                                           SUBJECTIVE STATEMENT: I was miserable last  night - my back, under my implant, and at the part on the side (the lat) but it feels better today.   EVAL: Since I had my exchange I feel like I lost my motion again.  It was pretty good and then it started to get a little more stiff.  Especially when I clean.  Going to get an [place] on the breast and an MRI for the liver.   PERTINENT HISTORY:  Leukemia history, previous Rt breast lumpectomy and radiation in 2020. Rt mastectomy with expander placement and SLNB 0/1 LN positive on 11/29/21.  Reduction on the left and switch to implant on 10/23/22.   PAIN:  Are you having pain? 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 I have help.   OBJECTIVE OBSERVATION: Cording noted in Rt axilla and in seated along lateral breast  PALPATION: Rt: +2 ttp Rt latissimus/ lateral chest, rhomboids Lt: +1-2 ttp latissimus and pectoralis borders  UPPER EXTREMITY AROM/PROM: A/PROM RIGHT    02/22/22 03/22/22 04/05/22 01/07/23  Shoulder extension 45 45 50  20 - back of shoulder pain  Shoulder flexion 150 - tight in axilla  165 - tight in axilla  167 - pull in axilla  167- pull in the axilla  152 -pull Rt axilla and into breast  Shoulder abduction 160- tight in axilla  165 - tight  in pectoralis  167 - pull in axilla 167 - pull in lat not axilla  160- pull in mid upper arm and lat muscle   Shoulder internal rotation 70    65 - pn top of shoulder   Shoulder external rotation 90    88    (Blank rows = not tested)  A/PROM LEFT   01/07/23  Shoulder extension 30 30  Shoulder flexion 175 162  Shoulder abduction 164 165  Shoulder internal rotation  50  Shoulder external rotation 105 85    (Blank rows = not tested)  Quick DASH: 45%  UQS  Dermatome Myotome Reflex  C4 equal    C5 Decreased on Rt 4-/5 bil   C6  equal Elbow flexion: 4+/5 bil Wrist extension: 4-/5 Lt, 4/5 Rt   C7 equal 4/5 bil   C8 Decreased Rt 4-/5 Lt, 4/5 Rt   T1 Decreased Rt 4-/5 Lt, 4/5 Rt    Grip strength: Rt: 30pounds,  Lt: 35pounds TODAY'S TREATMENT  01/21/23 STM Rt: pectoralis, UT, latissimus, and axilla in supine and QL, paraspinals, latissimus in sideyling.  Pinning, PROM, stretched positions included with STM.  Lengthening opposing forces/opposite hands pressure at hip and scapula in sidelying  01/16/23: STM Rt: pectoralis, UT, latissimus, and axilla in supine and QL, paraspinals, latissimus in sideyling.  Pinning, PROM, stretched positions included with STM.  LTR with goalpost arms 2x15sec bil Lengthening opposing forces/opposite hands pressure at hip and scapula in sidelying Bil shoulder depression holds Discussed daily habit which include lifting her 47 year old up from bed to the bathroom in the morning and how she could try a trial of not doing this a few days.  Also showed her a lumbar roll for driving Supine hooklying TrA activation assessment with pt using bearing down and diaphragm for activation.  Discussed pelvic floor and pt mentioned she is  having worsening urinary and fecal incontience recently.  Discussion about link between PF and fore, and possible pain and weakness from mastectomy.  Pt would like to do a pelvic referral but would like to wait and see what her scans say next  week.   Showed her TrA with pillow squeeze for adductor/PF  Taping I strip along pain at latissimus from axilla to midspine with instruction on removal  01/16/23: STM Rt: pectoralis, UT, latissimus, and axilla in supine and QL, paraspinals, latissimus in sideyling.  Pinning, PROM, stretched positions included with STM.  LTR with goalpost arms 2x15sec bil Lengthening opposing forces/opposite hands pressure at hip and scapula in sidelying Bil shoulder depression holds Discussed daily habit which include lifting her 47 year old up from bed to the bathroom in the morning and how she could try a trial of not doing this a few days.  Also showed her a lumbar roll for driving Supine hooklying TrA activation assessment with pt using bearing down and diaphragm for activation.  Discussed pelvic floor and pt mentioned she is having worsening urinary and fecal incontience recently.  Discussion about link between PF and fore, and possible pain and weakness from mastectomy.  Pt would like to do a pelvic referral but would like to wait and see what her scans say next week.   Showed her TrA with pillow squeeze for adductor/PF  Taping I strip along pain at latissimus from axilla to midspine with instruction on removal  01/14/23: Discussion of new CT findings and pain x 20mn STM Rt: pectoralis, UT, latissimus, and axilla in supine and QL, paraspinals, latissimus in sideyling.  Pinning, PROM, stretched positions included with STM.  LTR with goalpost arms 2x15sec bil Piriformis stretch and KTOS education x 15" each These added to HEP Lengthening opposing forces/opposite hands pressure at hip and scapula in sidelying Bil shoulder depression holds  01/07/23: Evaluation performed Updated HEP with review of each per below   PATIENT EDUCATION:  Education details: POC, updated HEP Person educated: Patient Education method: EConsulting civil engineer Demonstration, Verbal cues, and Handouts Education comprehension: verbalized  understanding and returned demonstration   HOME EXERCISE PROGRAM: Access code did not copy Supine Shoulder Flexion Extension AAROM with [place]el - 1-2 x daily - 7 x weekly - 10 reps - 5 seconds hold  Tennis ball release of rhomboids and lat Doorway stretch bil Latissimus wall stretch   Access Code: [email or web address]: [email or web address] Date: 01/14/2023 Prepared by: KShan Levans Exercises - Supine Lower Trunk Rotation  - 1 x daily - 7 x weekly - 1-3 sets - 10 reps - 20-30 seconds hold - Supine Hamstring Stretch with Strap  - 1 x daily - 7 x weekly - 1-3 sets - 10 reps - 20-30 seconds hold - Supine Figure 4 Piriformis Stretch with Leg Extension  - 1 x daily - 7 x weekly - 1-3 sets - 10 reps - 20-30 seconds hold - Supine Piriformis Stretch with Leg Straight  - 1 x daily - 7 x weekly - 3 reps - 30 seconds  hold - Child's Pose Side Bend  - 1 x daily - 7 x weekly - 1-3 sets - 10 reps - 20-30 seconds hold  ASSESSMENT: CLINICAL IMPRESSION:  Pt continues with QL and lat region pain after sitting for awhile but improved with MT and PT visits.  Has her PET scan today.    GOALS: Goals reviewed with patient? Yes  STGs = LONG TERM GOALS:  Pt will improve UE flexion AROM  to at least 160 to improve mobility Baseline: 150 Target date: 02/18/23 Goal status: INITIAL  2.  Pt will improve Lt UE strength to equal to the opposite side to allow for improved functional strength Baseline:  Target date: 02/18/23 Goal status: INITIAL  3. Pt will report pain at rest of 1/10 or less on the Rt lateral trunk  INITIAL  GOAL: 02/18/23 PLAN: PT FREQUENCY: 1-2x per week  PT DURATION: 6 more weeks  PLANNED INTERVENTIONS: Therapeutic exercises, Patient/Family education, and Manual therapy, Dry Needling, modalities as needed  PLAN FOR NEXT SESSION: PELVIC FLOOR REFERRAL as able (after scan results) Rt upper quadrant and lumbar ROM with STM release to   Lt: PROM and STM as needed, bil UE  strengthening, Do Ander Slade, PT 01/21/2023, 4:27 PM

## 2023-01-22 ENCOUNTER — Telehealth: Payer: Self-pay

## 2023-01-22 NOTE — Progress Notes (Signed)
Patient Care Team: Joann Amel, MD as PCP - General (Family Medicine) Joann Germany, RN as Oncology Nurse Navigator Joann Kaufmann, RN as Oncology Nurse Navigator  DIAGNOSIS: No diagnosis found.  SUMMARY OF ONCOLOGIC HISTORY: Oncology History  Ductal carcinoma in situ (DCIS) of right breast  01/25/2019 Initial Diagnosis   Screening mammogram showed bilateral calcifications right breast 5 mm, pleomorphic.  Left breast 1.6 cm punctate, smaller group of calcifications spanning 3 mm.  Right lower outer quadrant biopsy revealed low-grade DCIS ER 90%, PR 70%; biopsy of left breast calcifications ALH and PASH   01/31/2019 Genetic Testing   ATM c.8495G>A and RECQL4 c.2587G>A VUS identified on the 9-gene STAT panel and Multi-cancer panel through [facility].  The STAT Breast cancer panel offered by [facility] includes sequencing and rearrangement analysis for the following 9 genes:  ATM, BRCA1, BRCA2, CDH1, CHEK2, PALB2, PTEN, STK11 and TP53.   The Multi-Gene Panel offered by [facility] includes sequencing and/or deletion duplication testing of the following 84 genes: AIP, ALK, APC, ATM, AXIN2,BAP1,  BARD1, BLM, BMPR1A, BRCA1, BRCA2, BRIP1, CASR, CDC73, CDH1, CDK4, CDKN1B, CDKN1C, CDKN2A (p14ARF), CDKN2A (p16INK4a), CEBPA, CHEK2, CTNNA1, DICER1, DIS3L2, EGFR (c.2369C>T, p.Thr790Met variant only), EPCAM (Deletion/duplication testing only), FH, FLCN, GATA2, GPC3, GREM1 (Promoter region deletion/duplication testing only), HOXB13 (c.251G>A, p.Gly84Glu), HRAS, KIT, MAX, MEN1, MET, MITF (c.952G>A, p.Glu318Lys variant only), MLH1, MSH2, MSH3, MSH6, MUTYH, NBN, NF1, NF2, NTHL1, PALB2, PDGFRA, PHOX2B, PMS2, POLD1, POLE, POT1, PRKAR1A, PTCH1, PTEN, RAD50, RAD51C, RAD51D, RB1, RECQL4, RET, RUNX1, SDHAF2, SDHA (sequence changes only), SDHB, SDHC, SDHD, SMAD4, SMARCA4, SMARCB1, SMARCE1, STK11, SUFU, TERC, TERT, TMEM127, TP53, TSC1, TSC2, VHL, WRN and WT1.  he report date is February 02, 2019.   02/11/2019 Surgery   RT  lumpectomy: DICS Intermediate grade, Margins Neg, ER 90%, PR 70%; Lt Lumpectomy: UDH   02/26/2019 -  Anti-estrogen oral therapy   Tamoxifen '20mg'$  daily, stopped because of depression and facial numbness   05/20/2019 - 06/17/2019 Radiation Therapy   Adjuvant XRT   08/15/2021 Relapse/Recurrence   MRI surveillance detected Right breast calcifications 5 cm, Ant biopsy: IG DCIS with necrosis, ER 40%, PR 0%, Posterior biopsy: Intermediate grade DCIS with [facility]   08/19/2022 Cancer Staging   Staging form: Breast, AJCC 8th Edition - Clinical: Stage IA (cT60m, cN0, cM0, G3, ER-, PR-, HER2+) - Signed by GNicholas Lose MD on 08/19/2022 Histologic grading system: 3 grade system   AML (acute myeloid leukemia) ([place]  09/1998 Initial Diagnosis   AML (acute myeloid leukemia) ([place] treated with 2 induction regimens followed by 6 consolidation regimens, remission     CHIEF COMPLIANT: Follow-up of right breast DCIS   INTERVAL HISTORY: LSukhleen Kieseris a 47y.o. with above-mentioned history of right breast DCIS for which she underwent a lumpectomy. She presents to the clinic today for a follow-up.    ALLERGIES:  is allergic to tamoxifen citrate.  MEDICATIONS:  Current Outpatient Medications  Medication Sig Dispense Refill   amLODipine (NORVASC) 5 MG tablet TAKE 1 TABLET BY MOUTH EVERY DAY FOR 30 DAYS     azithromycin (ZITHROMAX) 250 MG tablet Take by mouth.     Cholecalciferol (D 1000) 25 MCG (1000 UT) capsule Take by mouth.     hydrochlorothiazide (HYDRODIURIL) 12.5 MG tablet Take 12.5 mg by mouth every morning.     hydrochlorothiazide (HYDRODIURIL) 25 MG tablet TAKE 1 TABLET BY MOUTH EVERY DAY IN THE MORNING FOR 30 DAYS ONCE A DAY     ibuprofen (ADVIL) 200 MG tablet Take 2  tablets by mouth every 12 (twelve) hours as needed.     mupirocin ointment (BACTROBAN) 2 % Apply topically.     ondansetron (ZOFRAN-ODT) 4 MG disintegrating tablet Take 4 mg by mouth every 8 (eight) hours as needed.     oxyCODONE (OXY  IR/ROXICODONE) 5 MG immediate release tablet Take by mouth.     Polyethyl Glycol-Propyl Glycol 0.4-0.3 % SOLN Apply 2 drops to eye in the morning, at noon, in the evening, and at bedtime.     triamcinolone (KENALOG) 0.025 % ointment Apply 1 application topically 2 (two) times daily. 30 g 0   No current facility-administered medications for this visit.    PHYSICAL EXAMINATION: ECOG PERFORMANCE STATUS: {CHL ONC ECOG [phone]}  There were no vitals filed for this visit. There were no vitals filed for this visit.  BREAST:*** No palpable masses or nodules in either right or left breasts. No palpable axillary supraclavicular or infraclavicular adenopathy no breast tenderness or nipple discharge. (exam performed in the presence of a chaperone)  LABORATORY DATA:  I have reviewed the data as listed    Latest Ref Rng & Units 10/27/2019    3:28 PM 06/16/2019    2:38 PM 06/15/2019    3:35 PM  CMP  Glucose 70 - 99 mg/dL 96  117  111   BUN 6 - 20 mg/dL '19  14  15   '$ Creatinine 0.44 - 1.00 mg/dL 0.84  0.82  0.70   Sodium 135 - 145 mmol/L 141  142  143   Potassium 3.5 - 5.1 mmol/L 3.9  4.4  3.9   Chloride 98 - 111 mmol/L 107  109  109   CO2 22 - 32 mmol/L 25  24    Calcium 8.9 - 10.3 mg/dL 9.1  9.1    Total Protein 6.5 - 8.1 g/dL 7.2  6.9    Total Bilirubin 0.3 - 1.2 mg/dL 0.3  0.4    Alkaline Phos 38 - 126 U/L 59  45    AST 15 - 41 U/L 13  11    ALT 0 - 44 U/L 15  12      Lab Results  Component Value Date   WBC 6.7 10/27/2019   HGB 13.1 10/27/2019   HCT 40.2 10/27/2019   MCV 91.8 10/27/2019   PLT 220 10/27/2019   NEUTROABS 4.2 10/27/2019    ASSESSMENT & PLAN:  No problem-specific Assessment & Plan notes found for this encounter.    No orders of the defined types were placed in this encounter.  The patient has a good understanding of the overall plan. she agrees with it. she will call with any problems that may develop before the next visit here. Total time spent: 30 mins  including face to face time and time spent for planning, charting and co-ordination of care   Joann Meadows, [place] 01/22/23    I Gardiner Coins am acting as a Education administrator for [employer]  ***

## 2023-01-22 NOTE — Telephone Encounter (Signed)
Pt called voicing great concern regarding PET. She is asking to speak with MD today despite in office visit scheduled for 2/29. Advised pt unfortunately Dr Lindi Adie is not able to speak with the pt today, and the best option is for her to keep her appt with him tomorrow morning to discuss everything in detail. Pt asks if there is any information I can give her. Advised pt I am not able to give any further information than has been released to her via MyChart. She verbalized thanks and understanding and plans to come tomorrow for her visit.

## 2023-01-23 ENCOUNTER — Other Ambulatory Visit: Payer: Self-pay | Admitting: *Deleted

## 2023-01-23 ENCOUNTER — Inpatient Hospital Stay: Payer: BC Managed Care – PPO

## 2023-01-23 ENCOUNTER — Inpatient Hospital Stay: Payer: BC Managed Care – PPO | Attending: Hematology and Oncology | Admitting: Hematology and Oncology

## 2023-01-23 ENCOUNTER — Other Ambulatory Visit: Payer: Self-pay | Admitting: Radiation Therapy

## 2023-01-23 ENCOUNTER — Ambulatory Visit: Payer: BC Managed Care – PPO | Admitting: Rehabilitation

## 2023-01-23 ENCOUNTER — Ambulatory Visit (HOSPITAL_COMMUNITY): Payer: BC Managed Care – PPO

## 2023-01-23 ENCOUNTER — Ambulatory Visit
Admission: RE | Admit: 2023-01-23 | Discharge: 2023-01-23 | Disposition: A | Discharge status: Home / Self Care | Payer: BC Managed Care – PPO | Source: Ambulatory Visit | Attending: Hematology and Oncology | Admitting: Hematology and Oncology

## 2023-01-23 ENCOUNTER — Other Ambulatory Visit: Payer: Self-pay

## 2023-01-23 ENCOUNTER — Other Ambulatory Visit (HOSPITAL_COMMUNITY): Payer: BC Managed Care – PPO

## 2023-01-23 VITALS — BP 141/93 | HR 97 | Temp 97.5°F | Resp 18 | Ht 65.0 in | Wt 166.3 lb

## 2023-01-23 DIAGNOSIS — K769 Liver disease, unspecified: Secondary | ICD-10-CM | POA: Insufficient documentation

## 2023-01-23 DIAGNOSIS — I10 Essential (primary) hypertension: Secondary | ICD-10-CM | POA: Diagnosis not present

## 2023-01-23 DIAGNOSIS — D329 Benign neoplasm of meninges, unspecified: Secondary | ICD-10-CM

## 2023-01-23 DIAGNOSIS — D0511 Intraductal carcinoma in situ of right breast: Secondary | ICD-10-CM | POA: Diagnosis not present

## 2023-01-23 DIAGNOSIS — R519 Headache, unspecified: Secondary | ICD-10-CM | POA: Diagnosis not present

## 2023-01-23 DIAGNOSIS — C9201 Acute myeloblastic leukemia, in remission: Secondary | ICD-10-CM | POA: Insufficient documentation

## 2023-01-23 DIAGNOSIS — Z79899 Other long term (current) drug therapy: Secondary | ICD-10-CM | POA: Diagnosis not present

## 2023-01-23 DIAGNOSIS — R0781 Pleurodynia: Secondary | ICD-10-CM | POA: Insufficient documentation

## 2023-01-23 DIAGNOSIS — Z86000 Personal history of in-situ neoplasm of breast: Secondary | ICD-10-CM | POA: Insufficient documentation

## 2023-01-23 LAB — CBC WITH DIFFERENTIAL (CANCER CENTER ONLY)
Abs Immature Granulocytes: 0.01 10*3/uL (ref 0.00–0.07)
Basophils Absolute: 0.1 10*3/uL (ref 0.0–0.1)
Basophils Relative: 1 %
Eosinophils Absolute: 0.1 10*3/uL (ref 0.0–0.5)
Eosinophils Relative: 2 %
HCT: 45.1 % (ref 36.0–46.0)
Hemoglobin: 15.1 g/dL — ABNORMAL HIGH (ref 12.0–15.0)
Immature Granulocytes: 0 %
Lymphocytes Relative: 22 %
Lymphs Abs: 1.5 10*3/uL (ref 0.7–4.0)
MCH: 29.4 pg (ref 26.0–34.0)
MCHC: 33.5 g/dL (ref 30.0–36.0)
MCV: 87.7 fL (ref 80.0–100.0)
Monocytes Absolute: 0.4 10*3/uL (ref 0.1–1.0)
Monocytes Relative: 6 %
Neutro Abs: 4.5 10*3/uL (ref 1.7–7.7)
Neutrophils Relative %: 69 %
Platelet Count: 268 10*3/uL (ref 150–400)
RBC: 5.14 MIL/uL — ABNORMAL HIGH (ref 3.87–5.11)
RDW: 13.1 % (ref 11.5–15.5)
WBC Count: 6.6 10*3/uL (ref 4.0–10.5)
nRBC: 0 % (ref 0.0–0.2)

## 2023-01-23 LAB — CMP (CANCER CENTER ONLY)
ALT: 14 U/L (ref 0–44)
AST: 14 U/L — ABNORMAL LOW (ref 15–41)
Albumin: 4.8 g/dL (ref 3.5–5.0)
Alkaline Phosphatase: 63 U/L (ref 38–126)
Anion gap: 8 (ref 5–15)
BUN: 14 mg/dL (ref 6–20)
CO2: 32 mmol/L (ref 22–32)
Calcium: 9.3 mg/dL (ref 8.9–10.3)
Chloride: 99 mmol/L (ref 98–111)
Creatinine: 0.77 mg/dL (ref 0.44–1.00)
GFR, Estimated: >60 mL/min (ref >60)
Glucose, Bld: 99 mg/dL (ref 70–99)
Potassium: 3.6 mmol/L (ref 3.5–5.1)
Sodium: 139 mmol/L (ref 135–145)
Total Bilirubin: 0.5 mg/dL (ref 0.3–1.2)
Total Protein: 7.5 g/dL (ref 6.5–8.1)

## 2023-01-23 MED ORDER — GADOPICLENOL 0.5 MMOL/ML IV SOLN
7.5000 mL | Freq: Once | INTRAVENOUS | Status: AC | PRN
  Administered 2023-01-23: 7.5 mL via INTRAVENOUS

## 2023-01-23 NOTE — Progress Notes (Signed)
Joann Mckusick, DO  [name]; P Ir Procedure Requests OK for US guided liver mass bx.  [place]  Dr. Geralyn Flash patient here has new liver lesions on MRI (image 29 of #13, and image 20 of #13).  Some of the others are hemangiomas.  Spoke with him about US guided biopsy of the lesion by the GB.    This will be coming across for a US guided request, which I said we can do.

## 2023-01-23 NOTE — Assessment & Plan Note (Signed)
02/11/19: RT lumpectomy: DICS Intermediate grade, Margins Neg, ER 90%, PR 70%; Lt Lumpectomy: UDH status post radiation, could not tolerate tamoxifen recurrence: 08/15/2021:MRI surveillance detected Right breast calcifications 5 cm, Ant biopsy: IG DCIS with necrosis, ER 40%, PR 0%, Posterior biopsy: Intermediate grade DCIS with [facility]  11/29/2021: Right mastectomy: High-grade DCIS with microinvasion, ER 0%, PR 0%, HER2 positive, 0/1 lymph node negative  T1c MIC, N0: Stage Ia   Patient had second opinion at [place] with Dr. Harden Mo who agreed with her plan of no systemic chemotherapy   Breast cancer surveillance: 1.  Mammogram 08/22/2022: Left breast benign breast density category B  2.  CT CAP 12/09/2022: Multiple liver lesions indicative of cysts and hemangiomas new right hepatic lobe lesion favored to be hemangioma otherwise no evidence of metastatic disease.  Radiology recommended an MRI of the liver. 3.  Liver MRI 01/08/2023: 2 New rim-enhancing lesions 2.3 cm (was 1.6 cm on 12/09/2022), 1.8 cm (measured 1.5 cm), benign hemangioma 2.4 cm and 1.9 cm and 1.3 cm unchanged 4.  PET CT scan 01/22/2023: Liver: 2.5 cm and 2.4 cm lesions  Recommendation: Liver biopsy Follow-up after liver biopsy to discuss pathology report.

## 2023-01-23 NOTE — Progress Notes (Signed)
Per PA team, pt needing brain MRI at GI.  Orders placed per MD and appt scheduled.  Pt notified and verbalized understanding.

## 2023-01-23 NOTE — Progress Notes (Signed)
STAT brain MRI scheduled, pt notified and verbalized understanding of appt details.

## 2023-01-24 ENCOUNTER — Telehealth: Payer: Self-pay | Admitting: Internal Medicine

## 2023-01-24 LAB — CANCER ANTIGEN 27.29: CA 27.29: 32.1 U/mL (ref 0.0–38.6)

## 2023-01-24 NOTE — Telephone Encounter (Signed)
Scheduled appt per 2/29 referral. Pt is aware of appt date and time. Pt is aware to arrive 15 mins prior to appt time and to bring and updated insurance card. Pt is aware of appt location.

## 2023-01-25 LAB — CANCER ANTIGEN 15-3: CA 15-3: 23 U/mL (ref 0.0–25.0)

## 2023-01-28 ENCOUNTER — Encounter: Payer: Self-pay | Admitting: Rehabilitation

## 2023-01-28 ENCOUNTER — Ambulatory Visit: Payer: BC Managed Care – PPO | Attending: Medical Oncology | Admitting: Rehabilitation

## 2023-01-28 DIAGNOSIS — D0511 Intraductal carcinoma in situ of right breast: Secondary | ICD-10-CM | POA: Diagnosis not present

## 2023-01-28 DIAGNOSIS — M25612 Stiffness of left shoulder, not elsewhere classified: Secondary | ICD-10-CM | POA: Insufficient documentation

## 2023-01-28 DIAGNOSIS — M25611 Stiffness of right shoulder, not elsewhere classified: Secondary | ICD-10-CM | POA: Insufficient documentation

## 2023-01-28 DIAGNOSIS — R293 Abnormal posture: Secondary | ICD-10-CM | POA: Insufficient documentation

## 2023-01-28 DIAGNOSIS — G8918 Other acute postprocedural pain: Secondary | ICD-10-CM | POA: Insufficient documentation

## 2023-01-28 DIAGNOSIS — Z483 Aftercare following surgery for neoplasm: Secondary | ICD-10-CM | POA: Diagnosis not present

## 2023-01-28 DIAGNOSIS — Z9189 Other specified personal risk factors, not elsewhere classified: Secondary | ICD-10-CM | POA: Diagnosis not present

## 2023-01-28 NOTE — Therapy (Signed)
OUTPATIENT PHYSICAL THERAPY ONCOLOGY TREATMENT  Patient Name: Joann Meadows MRN: [id number] DOB:1976-05-28, 47 y.o., female Today's Date: 01/28/2023   PT End of Session - 01/28/23 1159     Visit Number 5    Number of Visits 13    Date for PT Re-Evaluation 02/18/23    PT Start Time 1200    PT Stop Time 1255    PT Time Calculation (min) 55 min    Activity Tolerance Patient tolerated treatment well    Behavior During Therapy [facility] for tasks assessed/performed                 Past Medical History:  Diagnosis Date   Blood transfusion without reported diagnosis    leaukemia 18 years ago   Breast cancer ([place]) 2020   Right Breast Cancer   Endometriosis    Family history of colon cancer    Family history of leukemia    Fibroid    Hx of radiation therapy 06/2019   Leukemia ([place])    20 years ago   Personal history of radiation therapy 2020   Right Breast Cancer   PONV (postoperative nausea and vomiting)    pt states she vomits after colonoscopies   Past Surgical History:  Procedure Laterality Date   BREAST [facility]CISION[facility]SY [facility]11/2019   [facility]   BREAST LUMPECTOMY Right 02/11/2019   BREAST LUMPECTOMY WITH RADIOACTIVE SEED LOCALIZATION Bilateral 02/11/2019   Procedure: BILATERAL BREAST LUMPECTOMIES WITH BILATERAL RADIOACTIVE SEED LOCALIZATION;  Surgeon: Donnie Mesa, MD;  Location: [place];  Service: General;  Laterality: Bilateral;   MASTECTOMY Right 11/2021   Patient Active Problem List   Diagnosis Date Noted   Liver lesion 01/23/2023   COPD possible, would be Gold Group B 11/25/2019   DOE (dyspnea on exertion) 11/24/2019   Genetic testing 02/02/2019   Ductal carcinoma in situ (DCIS) of right breast 01/25/2019   AML (acute myeloid leukemia) ([place]) 01/25/2019   Family history of colon cancer    Family history of leukemia    Delivery normal 03/25/2017    PCP: Lujean Amel, MD  REFERRING PROVIDER: Minerva Fester, NP  REFERRING DIAG:  Rt breast cancer  THERAPY DIAG:  Stiffness of right shoulder, not elsewhere classified  Aftercare following surgery for neoplasm  Stiffness of left shoulder, not elsewhere classified  Post-mastectomy pain  At risk for lymphedema  ONSET DATE: 11/29/21  SUBJECTIVE                                                                                                                                                                                           SUBJECTIVE  STATEMENT: I am surprised about the liver. The brain thing is a meningioma benign and it was causing me a weird headache.  I have to get a biopsy on Friday.  He doesn't think that this would be causing my pain.    EVAL: Since I had my exchange I feel like I lost my motion again.  It was pretty good and then it started to get a little more stiff.  Especially when I clean.  Going to get an [place] on the breast and an MRI for the liver.   PERTINENT HISTORY:  Leukemia history, previous Rt breast lumpectomy and radiation in 2020. Rt mastectomy with expander placement and SLNB 0/1 LN positive on 11/29/21.  Reduction on the left and switch to implant on 10/23/22.   PAIN:  Are you having pain? 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 I have help.   OBJECTIVE OBSERVATION: Cording noted in Rt axilla and in seated along lateral breast  PALPATION: Rt: +2 ttp Rt latissimus/ lateral chest, rhomboids Lt: +1-2 ttp latissimus and pectoralis borders  UPPER EXTREMITY AROM/PROM: A/PROM RIGHT    02/22/22 03/22/22 04/05/22 01/07/23  Shoulder extension 45 45 50  20 - back of shoulder pain  Shoulder flexion 150 - tight in axilla  165 - tight in axilla  167 - pull in axilla  167- pull in the axilla   152 -pull Rt axilla and into breast  Shoulder abduction 160- tight in axilla  165 - tight in pectoralis  167 - pull in axilla 167 - pull in lat not axilla  160- pull in mid upper arm and lat muscle   Shoulder internal rotation 70    65 - pn top of shoulder   Shoulder external rotation 90    88    (Blank rows = not tested)  A/PROM LEFT   01/07/23  Shoulder extension 30 30  Shoulder flexion 175 162  Shoulder abduction 164 165  Shoulder internal rotation  50  Shoulder external rotation 105 85    (Blank rows = not tested)  Quick DASH: 45%  UQS  Dermatome Myotome Reflex  C4 equal    C5 Decreased on Rt 4-/5 bil   C6  equal Elbow flexion: 4+/5 bil Wrist extension: 4-/5 Lt, 4/5 Rt   C7 equal 4/5 bil   C8 Decreased Rt 4-/5 Lt, 4/5 Rt   T1 Decreased Rt 4-/5 Lt, 4/5 Rt    Grip strength: Rt: 30pounds,  Lt: 35pounds TODAY'S TREATMENT  01/28/23 Discussed new treatment plan and status STM Rt: pectoralis, UT, latissimus, and axilla in supine and QL, paraspinals, latissimus in sideyling.  Pinning, PROM, stretched positions included with STM.  Lengthening opposing forces/opposite hands pressure at hip and scapula in sidelying  01/21/23 STM Rt: pectoralis, UT, latissimus, and axilla in supine and QL, paraspinals, latissimus in sideyling.  Pinning, PROM, stretched positions included with STM.  Lengthening opposing forces/opposite hands pressure at hip and scapula in sidelying  01/16/23: STM Rt: pectoralis, UT, latissimus, and axilla in supine and QL, paraspinals, latissimus in sideyling.  Pinning, PROM, stretched positions included with STM.  LTR with goalpost arms 2x15sec bil Lengthening opposing forces/opposite hands pressure at hip and scapula in sidelying Bil shoulder depression holds  Discussed daily habit which include lifting her 49 year old up from bed to the bathroom in the morning and how she could try a trial of not doing this a few days.  Also showed her a lumbar roll for  driving Supine hooklying TrA activation assessment with pt using bearing down and diaphragm for activation.  Discussed pelvic floor and pt mentioned she is having worsening urinary and fecal incontience recently.  Discussion about link between PF and fore, and possible pain and weakness from mastectomy.  Pt would like to do a pelvic referral but would like to wait and see what her scans say next week.   Showed her TrA with pillow squeeze for adductor/PF  Taping I strip along pain at latissimus from axilla to midspine with instruction on removal  PATIENT EDUCATION:  Education details: POC, updated HEP Person educated: Patient Education method: Consulting civil engineer, Demonstration, Verbal cues, and Handouts Education comprehension: verbalized understanding and returned demonstration  HOME EXERCISE PROGRAM: Access code did not copy Supine Shoulder Flexion Extension AAROM with Dowel - 1-2 x daily - 7 x weekly - 10 reps - 5 seconds hold  Tennis ball release of rhomboids and lat Doorway stretch bil Latissimus wall stretch   Access Code: [email or web address] URL: [email or web address] Date: 01/14/2023 Prepared by: Shan Levans  Exercises - Supine Lower Trunk Rotation  - 1 x daily - 7 x weekly - 1-3 sets - 10 reps - 20-30 seconds hold - Supine Hamstring Stretch with Strap  - 1 x daily - 7 x weekly - 1-3 sets - 10 reps - 20-30 seconds hold - Supine Figure 4 Piriformis Stretch with Leg Extension  - 1 x daily - 7 x weekly - 1-3 sets - 10 reps - 20-30 seconds hold - Supine Piriformis Stretch with Leg Straight  - 1 x daily - 7 x weekly - 3 reps - 30 seconds  hold - Child's Pose Side Bend  - 1 x daily - 7 x weekly - 1-3 sets - 10 reps - 20-30 seconds hold  ASSESSMENT: CLINICAL IMPRESSION:  Pt has new news of metastatic breast cancer and will be having a biopsy Friday.  Continued with PT POC as pt still has muscular pain related to surgery and finds benefit with PT.    GOALS: Goals reviewed with patient?  Yes  STGs = LONG TERM GOALS:  Pt will improve UE flexion AROM to at least 160 to improve mobility Baseline: 150 Target date: 02/18/23 Goal status: INITIAL  2.  Pt will improve Lt UE strength to equal to the opposite side to allow for improved functional strength Baseline:  Target date: 02/18/23 Goal status: INITIAL  3. Pt will report pain at rest of 1/10 or less on the Rt lateral trunk  INITIAL  GOAL: 02/18/23 PLAN: PT FREQUENCY: 1-2x per week  PT DURATION: 6 more weeks  PLANNED INTERVENTIONS: Therapeutic exercises, Patient/Family education, and Manual therapy, Dry Needling, modalities as needed  PLAN FOR NEXT SESSION: PELVIC FLOOR REFERRAL as able (after scan results) Rt upper quadrant and lumbar ROM with STM release to   Lt: PROM and STM as needed, bil UE strengthening, Do Ander Slade, PT 01/28/2023, 12:55 PM

## 2023-01-30 ENCOUNTER — Ambulatory Visit: Payer: BC Managed Care – PPO | Admitting: Rehabilitation

## 2023-01-30 ENCOUNTER — Other Ambulatory Visit: Payer: Self-pay

## 2023-01-30 ENCOUNTER — Other Ambulatory Visit: Payer: Self-pay | Admitting: Internal Medicine

## 2023-01-30 ENCOUNTER — Inpatient Hospital Stay: Payer: BC Managed Care – PPO | Attending: Hematology and Oncology | Admitting: Internal Medicine

## 2023-01-30 ENCOUNTER — Encounter: Payer: Self-pay | Admitting: Rehabilitation

## 2023-01-30 ENCOUNTER — Telehealth: Payer: Self-pay | Admitting: Family Medicine

## 2023-01-30 VITALS — BP 121/83 | HR 95 | Temp 97.9°F | Resp 14 | Wt 168.4 lb

## 2023-01-30 DIAGNOSIS — D329 Benign neoplasm of meninges, unspecified: Secondary | ICD-10-CM | POA: Diagnosis not present

## 2023-01-30 DIAGNOSIS — Z79899 Other long term (current) drug therapy: Secondary | ICD-10-CM | POA: Insufficient documentation

## 2023-01-30 DIAGNOSIS — D1803 Hemangioma of intra-abdominal structures: Secondary | ICD-10-CM | POA: Diagnosis not present

## 2023-01-30 DIAGNOSIS — Z86000 Personal history of in-situ neoplasm of breast: Secondary | ICD-10-CM | POA: Diagnosis not present

## 2023-01-30 DIAGNOSIS — R293 Abnormal posture: Secondary | ICD-10-CM | POA: Diagnosis not present

## 2023-01-30 DIAGNOSIS — M25612 Stiffness of left shoulder, not elsewhere classified: Secondary | ICD-10-CM | POA: Diagnosis not present

## 2023-01-30 DIAGNOSIS — R519 Headache, unspecified: Secondary | ICD-10-CM | POA: Diagnosis not present

## 2023-01-30 DIAGNOSIS — M25611 Stiffness of right shoulder, not elsewhere classified: Secondary | ICD-10-CM | POA: Diagnosis not present

## 2023-01-30 DIAGNOSIS — Z923 Personal history of irradiation: Secondary | ICD-10-CM | POA: Insufficient documentation

## 2023-01-30 DIAGNOSIS — D0511 Intraductal carcinoma in situ of right breast: Secondary | ICD-10-CM | POA: Diagnosis not present

## 2023-01-30 DIAGNOSIS — Z170 Estrogen receptor positive status [ER+]: Secondary | ICD-10-CM | POA: Diagnosis not present

## 2023-01-30 DIAGNOSIS — G8918 Other acute postprocedural pain: Secondary | ICD-10-CM | POA: Diagnosis not present

## 2023-01-30 DIAGNOSIS — C9201 Acute myeloblastic leukemia, in remission: Secondary | ICD-10-CM | POA: Diagnosis not present

## 2023-01-30 DIAGNOSIS — Z9011 Acquired absence of right breast and nipple: Secondary | ICD-10-CM | POA: Insufficient documentation

## 2023-01-30 DIAGNOSIS — Z5112 Encounter for antineoplastic immunotherapy: Secondary | ICD-10-CM | POA: Insufficient documentation

## 2023-01-30 DIAGNOSIS — C50911 Malignant neoplasm of unspecified site of right female breast: Secondary | ICD-10-CM | POA: Diagnosis not present

## 2023-01-30 DIAGNOSIS — I10 Essential (primary) hypertension: Secondary | ICD-10-CM | POA: Insufficient documentation

## 2023-01-30 DIAGNOSIS — K769 Liver disease, unspecified: Secondary | ICD-10-CM | POA: Diagnosis not present

## 2023-01-30 DIAGNOSIS — Z9189 Other specified personal risk factors, not elsewhere classified: Secondary | ICD-10-CM

## 2023-01-30 DIAGNOSIS — R0781 Pleurodynia: Secondary | ICD-10-CM | POA: Diagnosis not present

## 2023-01-30 DIAGNOSIS — Z5181 Encounter for therapeutic drug level monitoring: Secondary | ICD-10-CM | POA: Diagnosis not present

## 2023-01-30 DIAGNOSIS — C787 Secondary malignant neoplasm of liver and intrahepatic bile duct: Secondary | ICD-10-CM | POA: Insufficient documentation

## 2023-01-30 DIAGNOSIS — Z483 Aftercare following surgery for neoplasm: Secondary | ICD-10-CM

## 2023-01-30 NOTE — Therapy (Signed)
OUTPATIENT PHYSICAL THERAPY ONCOLOGY TREATMENT  Patient Name: Joann Meadows MRN: [id number] DOB:1976-09-05, 47 y.o., female Today's Date: 01/30/2023   PT End of Session - 01/30/23 1201     Visit Number 6    Number of Visits 13    Date for PT Re-Evaluation 02/18/23    PT Start Time 1203    PT Stop Time 1250    PT Time Calculation (min) 47 min    Activity Tolerance Patient tolerated treatment well    Behavior During Therapy [facility] for tasks assessed/performed                 Past Medical History:  Diagnosis Date   Blood transfusion without reported diagnosis    leaukemia 18 years ago   Breast cancer ([place]) 2020   Right Breast Cancer   Endometriosis    Family history of colon cancer    Family history of leukemia    Fibroid    Hx of radiation therapy 06/2019   Leukemia ([place])    20 years ago   Personal history of radiation therapy 2020   Right Breast Cancer   PONV (postoperative nausea and vomiting)    pt states she vomits after colonoscopies   Past Surgical History:  Procedure Laterality Date   BREAST EXCISIONAL BIOPSY Left 02/11/2019   [facility]   BREAST LUMPECTOMY Right 02/11/2019   BREAST LUMPECTOMY WITH RADIOACTIVE SEED LOCALIZATION Bilateral 02/11/2019   Procedure: BILATERAL BREAST LUMPECTOMIES WITH BILATERAL RADIOACTIVE SEED LOCALIZATION;  Surgeon: Donnie Mesa, MD;  Location: [place];  Service: General;  Laterality: Bilateral;   MASTECTOMY Right 11/2021   Patient Active Problem List   Diagnosis Date Noted   Liver lesion 01/23/2023   COPD possible, would be Gold Group B 11/25/2019   DOE (dyspnea on exertion) 11/24/2019   Genetic testing 02/02/2019   Ductal carcinoma in situ (DCIS) of right breast 01/25/2019   AML (acute myeloid leukemia) ([place]) 01/25/2019   Family history of colon cancer    Family history of leukemia    Delivery normal 03/25/2017    PCP: Lujean Amel, MD  REFERRING PROVIDER: Minerva Fester, NP  REFERRING DIAG:  Rt breast cancer  THERAPY DIAG:  Stiffness of right shoulder, not elsewhere classified  Aftercare following surgery for neoplasm  Stiffness of left shoulder, not elsewhere classified  Post-mastectomy pain  At risk for lymphedema  Ductal carcinoma in situ (DCIS) of right breast  Abnormal posture  ONSET DATE: 11/29/21  SUBJECTIVE  SUBJECTIVE STATEMENT:  I felt so good after last time and then yesterday around 12 and it came back.    EVAL: Since I had my exchange I feel like I lost my motion again.  It was pretty good and then it started to get a little more stiff.  Especially when I clean.  Going to get an [place] on the breast and an MRI for the liver.   PERTINENT HISTORY:  Leukemia history, previous Rt breast lumpectomy and radiation in 2020. Rt mastectomy with expander placement and SLNB 0/1 LN positive on 11/29/21.  Reduction on the left and switch to implant on 10/23/22.   PAIN:  Are you having pain? 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 I have help.   OBJECTIVE OBSERVATION: Cording noted in Rt axilla and in seated along lateral breast  PALPATION: Rt: +2 ttp Rt latissimus/ lateral chest, rhomboids Lt: +1-2 ttp latissimus and pectoralis borders  UPPER EXTREMITY AROM/PROM: A/PROM RIGHT    02/22/22 03/22/22 04/05/22 01/07/23  Shoulder extension 45 45 50  20 - back of shoulder pain  Shoulder flexion 150 - tight in axilla  165 - tight in axilla  167 - pull in axilla  167- pull in the axilla  152 -pull Rt axilla and into breast  Shoulder  abduction 160- tight in axilla  165 - tight in pectoralis  167 - pull in axilla 167 - pull in lat not axilla  160- pull in mid upper arm and lat muscle   Shoulder internal rotation 70    65 - pn top of shoulder   Shoulder external rotation 90    88    (Blank rows = not tested)  A/PROM LEFT   01/07/23  Shoulder extension 30 30  Shoulder flexion 175 162  Shoulder abduction 164 165  Shoulder internal rotation  50  Shoulder external rotation 105 85    (Blank rows = not tested)  Quick DASH: 45%  UQS  Dermatome Myotome Reflex  C4 equal    C5 Decreased on Rt 4-/5 bil   C6  equal Elbow flexion: 4+/5 bil Wrist extension: 4-/5 Lt, 4/5 Rt   C7 equal 4/5 bil   C8 Decreased Rt 4-/5 Lt, 4/5 Rt   T1 Decreased Rt 4-/5 Lt, 4/5 Rt    Grip strength: Rt: 30pounds,  Lt: 35pounds TODAY'S TREATMENT  01/30/23 Large ball QL stretch for the Rt 3x30"  Childs post Rt stretch x 30" Cat/cow x 10 STM Rt: pectoralis, UT, latissimus, and axilla in supine and QL, paraspinals, latissimus in sideyling.  Pinning, PROM, stretched positions included with STM.  Lengthening opposing forces/opposite hands pressure at hip and scapula in sidelying  01/28/23 Discussed new treatment plan and status STM Rt: pectoralis, UT, latissimus, and axilla in supine and QL, paraspinals, latissimus in sideyling.  Pinning, PROM, stretched positions included with STM.  Lengthening opposing forces/opposite hands pressure at hip and scapula in sidelying  01/21/23 STM Rt: pectoralis, UT, latissimus, and axilla in supine and QL, paraspinals, latissimus in sideyling.  Pinning, PROM, stretched positions included with STM.  Lengthening opposing forces/opposite hands pressure at hip and scapula in sidelying  01/16/23: STM Rt: pectoralis, UT, latissimus, and axilla in supine and QL, paraspinals, latissimus  in sideyling.  Pinning, PROM, stretched positions included with STM.  LTR with goalpost arms 2x15sec bil Lengthening opposing  forces/opposite hands pressure at hip and scapula in sidelying Bil shoulder depression holds Discussed daily habit which include lifting her 49 year old up from bed to the bathroom in the morning and how she could try a trial of not doing this a few days.  Also showed her a lumbar roll for driving Supine hooklying TrA activation assessment with pt using bearing down and diaphragm for activation.  Discussed pelvic floor and pt mentioned she is having worsening urinary and fecal incontience recently.  Discussion about link between PF and fore, and possible pain and weakness from mastectomy.  Pt would like to do a pelvic referral but would like to wait and see what her scans say next week.   Showed her TrA with pillow squeeze for adductor/PF  Taping I strip along pain at latissimus from axilla to midspine with instruction on removal  PATIENT EDUCATION:  Education details: POC, updated HEP Person educated: Patient Education method: Consulting civil engineer, Demonstration, Verbal cues, and Handouts Education comprehension: verbalized understanding and returned demonstration  HOME EXERCISE PROGRAM: Access code did not copy Supine Shoulder Flexion Extension AAROM with Dowel - 1-2 x daily - 7 x weekly - 10 reps - 5 seconds hold  Tennis ball release of rhomboids and lat Doorway stretch bil Latissimus wall stretch   Access Code: [email or web address] URL: [email or web address] Date: 01/14/2023 Prepared by: Shan Levans  Exercises - Supine Lower Trunk Rotation  - 1 x daily - 7 x weekly - 1-3 sets - 10 reps - 20-30 seconds hold - Supine Hamstring Stretch with Strap  - 1 x daily - 7 x weekly - 1-3 sets - 10 reps - 20-30 seconds hold - Supine Figure 4 Piriformis Stretch with Leg Extension  - 1 x daily - 7 x weekly - 1-3 sets - 10 reps - 20-30 seconds hold - Supine Piriformis Stretch with Leg Straight  - 1 x daily - 7 x weekly - 3 reps - 30 seconds  hold - Child's Pose Side Bend  - 1 x daily - 7 x weekly - 1-3 sets  - 10 reps - 20-30 seconds hold  ASSESSMENT: CLINICAL IMPRESSION:  Pt has new news of metastatic breast cancer and will be having a biopsy Friday.  Continued with PT POC as pt still has muscular pain related to surgery and finds benefit with PT.    GOALS: Goals reviewed with patient? Yes  STGs = LONG TERM GOALS:  Pt will improve UE flexion AROM to at least 160 to improve mobility Baseline: 150 Target date: 02/18/23 Goal status: INITIAL  2.  Pt will improve Lt UE strength to equal to the opposite side to allow for improved functional strength Baseline:  Target date: 02/18/23 Goal status: INITIAL  3. Pt will report pain at rest of 1/10 or less on the Rt lateral trunk  INITIAL  GOAL: 02/18/23 PLAN: PT FREQUENCY: 1-2x per week  PT DURATION: 6 more weeks  PLANNED INTERVENTIONS: Therapeutic exercises, Patient/Family education, and Manual therapy, Dry Needling, modalities as needed  PLAN FOR NEXT SESSION: PELVIC FLOOR REFERRAL as able (after scan results) Rt upper quadrant and lumbar ROM with STM release to   Lt: PROM and STM as needed, bil UE strengthening, Do Ander Slade, PT 01/30/2023, 1:05 PM

## 2023-01-30 NOTE — Telephone Encounter (Signed)
Per 3/7 LOS reached out to patient to schedule. Patient aware of date and time of appointment.

## 2023-01-30 NOTE — Progress Notes (Signed)
[place], [place] [id number] [phone]   New Patient Evaluation  Date of Service: 01/30/23 Patient Name: Joann Meadows Patient MRN: [id number] Patient DOB: 1976/09/14 Provider: Ventura Sellers, MD  Identifying Statement:  Joann Meadows is a 47 y.o. female with Meningioma [facility]) who presents for initial consultation and evaluation regarding cancer associated neurologic deficits.    Referring Provider: Nicholas Lose, MD [address] [place],  [place] [id number]  Primary Cancer:  Oncologic History: Oncology History  Ductal carcinoma in situ (DCIS) of right breast  01/25/2019 Initial Diagnosis   Screening mammogram showed bilateral calcifications right breast 5 mm, pleomorphic.  Left breast 1.6 cm punctate, smaller group of calcifications spanning 3 mm.  Right lower outer quadrant biopsy revealed low-grade DCIS ER 90%, PR 70%; biopsy of left breast calcifications ALH and PASH   01/31/2019 Genetic Testing   ATM c.8495G>A and RECQL4 c.2587G>A VUS identified on the 9-gene STAT panel and Multi-cancer panel through [facility].  The STAT Breast cancer panel offered by [facility] includes sequencing and rearrangement analysis for the following 9 genes:  ATM, BRCA1, BRCA2, CDH1, CHEK2, PALB2, PTEN, STK11 and TP53.   The Multi-Gene Panel offered by [facility] includes sequencing and/or deletion duplication testing of the following 84 genes: AIP, ALK, APC, ATM, AXIN2,BAP1,  BARD1, BLM, BMPR1A, BRCA1, BRCA2, BRIP1, CASR, CDC73, CDH1, CDK4, CDKN1B, CDKN1C, CDKN2A (p14ARF), CDKN2A (p16INK4a), CEBPA, CHEK2, CTNNA1, DICER1, DIS3L2, EGFR (c.2369C>T, p.Thr790Met variant only), EPCAM (Deletion/duplication testing only), FH, FLCN, GATA2, GPC3, GREM1 (Promoter region deletion/duplication testing only), HOXB13 (c.251G>A, p.Gly84Glu), HRAS, KIT, MAX, MEN1, MET, MITF (c.952G>A, p.Glu318Lys variant only), MLH1, MSH2, MSH3, MSH6, MUTYH, NBN, NF1,  NF2, NTHL1, PALB2, PDGFRA, PHOX2B, PMS2, POLD1, POLE, POT1, PRKAR1A, PTCH1, PTEN, RAD50, RAD51C, RAD51D, RB1, RECQL4, RET, RUNX1, SDHAF2, SDHA (sequence changes only), SDHB, SDHC, SDHD, SMAD4, SMARCA4, SMARCB1, SMARCE1, STK11, SUFU, TERC, TERT, TMEM127, TP53, TSC1, TSC2, VHL, WRN and WT1.  he report date is February 02, 2019.   02/11/2019 Surgery   RT lumpectomy: DICS Intermediate grade, Margins Neg, ER 90%, PR 70%; Lt Lumpectomy: UDH   02/26/2019 -  Anti-estrogen oral therapy   Tamoxifen '20mg'$  daily, stopped because of depression and facial numbness   05/20/2019 - 06/17/2019 Radiation Therapy   Adjuvant XRT   08/15/2021 Relapse/Recurrence   MRI surveillance detected Right breast calcifications 5 cm, Ant biopsy: IG DCIS with necrosis, ER 40%, PR 0%, Posterior biopsy: Intermediate grade DCIS with [facility]   08/19/2022 Cancer Staging   Staging form: Breast, AJCC 8th Edition - Clinical: Stage IA (cT61m, cN0, cM0, G3, ER-, PR-, HER2+) - Signed by GNicholas Lose MD on 08/19/2022 Histologic grading system: 3 grade system   AML (acute myeloid leukemia) ([facility]  09/1998 Initial Diagnosis   AML (acute myeloid leukemia) ([place] treated with 2 induction regimens followed by 6 consolidation regimens, remission     History of Present Illness: The patient's records from the referring physician were obtained and reviewed and the patient interviewed to confirm this HPI.  LMalky Leccesepresents to review recent MRI brain findings.  She describes 1-2 months history of headaches, described as modest pressure on "sides and sometimes top of head".  It is not associated with any nausea, photophobia.  She does have more remote headache history.  Sleep has been very poor, just 2-3 hours per night.  She attributes this to increased stress surrounding her progressive breast cancer.  Has systemic therapy planned with Dr. GLindi Adie  Medications: Current Outpatient  Medications on File Prior to Visit  Medication Sig Dispense Refill    hydrochlorothiazide (HYDRODIURIL) 25 MG tablet TAKE 1 TABLET BY MOUTH EVERY DAY IN THE MORNING FOR 30 DAYS ONCE A DAY     VITAMIN D PO Take 5,000 mg by mouth daily.     ALPRAZolam (XANAX) 0.25 MG tablet Take 0.25 mg by mouth daily as needed. (Patient not taking: Reported on 01/30/2023)     ibuprofen (ADVIL) 200 MG tablet Take 2 tablets by mouth every 12 (twelve) hours as needed. (Patient not taking: Reported on 01/30/2023)     ondansetron (ZOFRAN-ODT) 4 MG disintegrating tablet Take 4 mg by mouth every 8 (eight) hours as needed. (Patient not taking: Reported on 01/30/2023)     No current facility-administered medications on file prior to visit.    Allergies:  Allergies  Allergen Reactions   Tamoxifen Citrate     Other reaction(s): depression, joint pain   Past Medical History:  Past Medical History:  Diagnosis Date   Blood transfusion without reported diagnosis    leaukemia 18 years ago   Breast cancer ([place]) 2020   Right Breast Cancer   Endometriosis    Family history of colon cancer    Family history of leukemia    Fibroid    Hx of radiation therapy 06/2019   Leukemia ([place])    20 years ago   Personal history of radiation therapy 2020   Right Breast Cancer   PONV (postoperative nausea and vomiting)    pt states she vomits after colonoscopies   Past Surgical History:  Past Surgical History:  Procedure Laterality Date   BREAST EXCISIONAL BIOPSY Left 02/11/2019   [facility]   BREAST LUMPECTOMY Right 02/11/2019   BREAST LUMPECTOMY WITH RADIOACTIVE SEED LOCALIZATION Bilateral 02/11/2019   Procedure: BILATERAL BREAST LUMPECTOMIES WITH BILATERAL RADIOACTIVE SEED LOCALIZATION;  Surgeon: Donnie Mesa, MD;  Location: [place];  Service: General;  Laterality: Bilateral;   MASTECTOMY Right 11/2021   Social History:  Social History   Socioeconomic History   Marital status: Single    Spouse name: Not on file   Number of children: 2   Years of education: Not on file    Highest education level: High school graduate  Occupational History   Not on file  Tobacco Use   Smoking status: Former    Years: 10.00    Types: Cigarettes    Quit date: 01/25/2015    Years since quitting: 8.0   Smokeless tobacco: Never  Vaping Use   Vaping Use: Never used  Substance and Sexual Activity   Alcohol use: No   Drug use: No   Sexual activity: Yes    Birth control/protection: None  Other Topics Concern   Not on file  Social History Narrative   Lives with children   Social Determinants of Health   Financial Resource Strain: Not on file  Food Insecurity: No Food Insecurity (10/03/2022)   Hunger Vital Sign    Worried About Running Out of Food in the Last Year: Never true    Ran Out of Food in the Last Year: Never true  Transportation Needs: No Transportation Needs (10/03/2022)   PRAPARE - Hydrologist (Medical): No    Lack of Transportation (Non-Medical): No  Physical Activity: Not on file  Stress: Not on file  Social Connections: Not on file  Intimate Partner Violence: Not At Risk (02/26/2019)   Humiliation, Afraid, Rape, and Kick questionnaire    Fear  of Current or Ex-Partner: No    Emotionally Abused: No    Physically Abused: No    Sexually Abused: No   Family History:  Family History  Problem Relation Age of Onset   Colon cancer Mother 57       d. 66   Hypertension Father    Diabetes Maternal Aunt    Diabetes Maternal Uncle    Mental retardation Maternal Uncle    Diabetes Paternal Aunt    Diabetes Maternal Grandmother    Stroke Paternal Grandmother        c. 12   Leukemia Paternal Grandfather        d. 82; ?CLL   Leukemia Cousin 4       mat first cousin   Lymphoma Cousin 65       pat first cousin    Review of Systems: Constitutional: Doesn't report fevers, chills or abnormal weight loss Eyes: Doesn't report blurriness of vision Ears, nose, mouth, throat, and face: Doesn't report sore throat Respiratory: Doesn't  report cough, dyspnea or wheezes Cardiovascular: Doesn't report palpitation, chest discomfort  Gastrointestinal:  Doesn't report nausea, constipation, diarrhea GU: Doesn't report incontinence Skin: Doesn't report skin rashes Neurological: Per HPI Musculoskeletal: Doesn't report joint pain Behavioral/Psych: Doesn't report anxiety  Physical Exam: Vitals:   01/30/23 1357  BP: 121/83  Pulse: 95  Resp: 14  Temp: 97.9 F (36.6 C)  SpO2: 100%   KPS: 90. General: Alert, cooperative, pleasant, in no acute distress Head: Normal EENT: No conjunctival injection or scleral icterus.  Lungs: Resp effort normal Cardiac: Regular rate Abdomen: Non-distended abdomen Skin: No rashes cyanosis or petechiae. Extremities: No clubbing or edema  Neurologic Exam: Mental Status: Awake, alert, attentive to examiner. Oriented to self and environment. Language is fluent with intact comprehension.  Cranial Nerves: Visual acuity is grossly normal. Visual fields are full. Extra-ocular movements intact. No ptosis. Face is symmetric Motor: Tone and bulk are normal. Power is full in both arms and legs. Reflexes are symmetric, no pathologic reflexes present.  Sensory: Intact to light touch Gait: Normal.   Labs: I have reviewed the data as listed    Component Value Date/Time   NA 139 01/23/2023 0904   K 3.6 01/23/2023 0904   CL 99 01/23/2023 0904   CO2 32 01/23/2023 0904   GLUCOSE 99 01/23/2023 0904   BUN 14 01/23/2023 0904   CREATININE 0.77 01/23/2023 0904   CALCIUM 9.3 01/23/2023 0904   PROT 7.5 01/23/2023 0904   ALBUMIN 4.8 01/23/2023 0904   AST 14 (L) 01/23/2023 0904   ALT 14 01/23/2023 0904   ALKPHOS 63 01/23/2023 0904   BILITOT 0.5 01/23/2023 0904   GFRNONAA >60 01/23/2023 0904   GFRAA >60 10/27/2019 1528   Lab Results  Component Value Date   WBC 6.6 01/23/2023   NEUTROABS 4.5 01/23/2023   HGB 15.1 (H) 01/23/2023   HCT 45.1 01/23/2023   MCV 87.7 01/23/2023   PLT 268 01/23/2023     Imaging:  MR Brain W Wo Contrast  Result Date: 01/23/2023 CLINICAL DATA:  Metastatic disease evaluation. Right-sided headaches. History of metastatic breast cancer. EXAM: MRI HEAD WITHOUT AND WITH CONTRAST TECHNIQUE: Multiplanar, multiecho pulse sequences of the brain and surrounding structures were obtained without and with intravenous contrast. CONTRAST:  7.5 mL Vueway COMPARISON:  Head MRI 06/15/2019 FINDINGS: Brain: There is no evidence of an acute infarct, intracranial hemorrhage, midline shift, or extra-axial fluid collection. The ventricles and sulci are normal. The brain parenchyma itself is  normal in signal. There is a partially empty sella. There is a homogeneously enhancing extra-axial mass measuring 10 x 5 mm with a broad dural interface at the anterior aspect of the right tentorial leaflet. This was less conspicuous on the prior unenhanced head MRI and has slightly enlarged in the interim. There is associated subtle flattening of the right lateral contour of the pons without brain edema. No other abnormal intracranial enhancement is identified. Vascular: Major intracranial vascular flow voids are preserved. Skull and upper cervical spine: Unremarkable bone marrow signal. Sinuses/Orbits: Unremarkable orbits. Mucous retention cysts in the left sphenoid sinus and both maxillary sinuses. Clear mastoid air cells. Other: None. IMPRESSION: 1. 10 mm anterior right tentorial meningioma. 2. No evidence of intracranial metastases. Electronically Signed   By: Logan Bores M.D.   On: 01/23/2023 13:06   NM PET Image Initial (PI) Skull Base To Thigh  Result Date: 01/22/2023 CLINICAL DATA:  Initial Treatment strategy for breast cancer. EXAM: NUCLEAR MEDICINE PET SKULL BASE TO THIGH TECHNIQUE: 8.4 mCi F-18 FDG was injected intravenously. Full-ring PET imaging was performed from the skull base to thigh after the radiotracer. CT data was obtained and used for attenuation correction and anatomic localization.  Fasting blood glucose: 92 mg/dl COMPARISON:  CT 12/09/2022 and MRI abdomen 12/07/2022 FINDINGS: Mediastinal blood pool activity: SUV max 3.05 Liver activity: SUV max NA NECK: Bilateral and symmetric increased uptake within the pharyngeal tonsils identified. There is also a left level 2 lymph node which exhibits mild tracer uptake within SUV max 3.40. Incidental CT findings: None. CHEST: No tracer avid mediastinal, hilar, or axillary lymph nodes. No tracer avid pulmonary nodules. Incidental CT findings: 4 mm right upper lobe lung nodule is too small to characterize by PET-CT, image 26/7. This is unchanged when compared with remote CT of the chest from 10/27/2019 compatible with a benign nodule. Similarly, within the right base there is a 5 mm nodule, image 46/7. This is also unchanged from 10/27/2019 compatible with a benign nodule. ABDOMEN/PELVIS: Within the anterior dome of right lobe of liver there is a 2.5 x 2.3 cm low-density lesion which has an SUV max of 9.72, image 84/4. On 01/07/2023 this measured 2.2 x 2.0 cm Within segment 5, adjacent to the gallbladder there is a 2.4 x 2.0 cm low-density lesion which has an SUV max of 7.40, image 106/4. On 01/07/2023 this measured 2.2 x 1.8 cm. No tracer avid abdominopelvic lymph nodes.  No ascites identified. No abnormal uptake within the pancreas, spleen or adrenal glands. Incidental CT findings: Increased uptake within bilateral adnexal regions noted which is favored to represent physiologic activity. Enlarged fibroid uterus identified. SKELETON: No focal hypermetabolic activity to suggest skeletal metastasis. Incidental CT findings: None. IMPRESSION: 1. There are 2 tracer avid lesions within the right lobe of liver which compatible with metastatic disease. 2. Choose bilateral and symmetric increased uptake within the pharyngeal tonsils is favored to represent physiologic activity. There is also a left level 2 lymph node which exhibits mild tracer uptake and is favored  to represent reactive adenopathy. 3. Small pulmonary nodules are unchanged from 10/27/2019 compatible with benign pulmonary nodules. 4. Enlarged fibroid uterus. Electronically Signed   By: Kerby Moors M.D.   On: 01/22/2023 08:47   MR LIVER W WO CONTRAST  Result Date: 01/08/2023 CLINICAL DATA:  Evaluate liver lesions.  History of breast cancer. EXAM: MRI ABDOMEN WITHOUT AND WITH CONTRAST TECHNIQUE: Multiplanar multisequence MR imaging of the abdomen was performed both before and after the administration  of intravenous contrast. CONTRAST:  7.5 cc Vueway COMPARISON:  MRI from 07/05/2020 FINDINGS: Lower chest: No acute findings. Hepatobiliary: Within the dome of liver there is a mildly T2 hyperintense, T1 hypointense lesion which on the postcontrast images exhibits early contiguous, rim hyperenhancement measuring 2.3 x 2.2 cm, image 12/13. On 12/09/2022 this measured 1.6 x 1.6 cm. On the delayed images there is persistent and progressive internal enhancement. Within segment 5 there is a new rim enhancing lesion which measures 1.8 x 1.8 cm, image 49/13. On 12/09/2022 this measured 1.4 x 1.5 cm. This also exhibits persistent peripheral enhancement with some signs of delayed fill-in. Within segment 2 of the left hepatic lobe there is a 2.4 x 1.5 cm, T2 hyperintense lesion which shows early peripheral discontiguous enhancement with delayed fill-in compatible with the classic appearance of a benign hemangioma, image 5/9. This is unchanged from the prior study. Within the posterior dome of the liver there is a T2 hyperintense lesion measuring 1.9 x 1.7 cm, image 17/15. This is also unchanged from the previous exam and also as the classic imaging appearance of benign hemangioma. Similar appearance of hemangioma within the lateral dome of liver measuring 1.7 x 1.1 cm, image 8/20. Central right lobe of liver flash fill hemangioma measures 1.3 cm, image 17/13. Several small cysts are also identified measuring up to 3 mm.  Gallbladder appears normal.  No bile duct dilatation. Pancreas: No mass, inflammatory changes, or other parenchymal abnormality identified. Spleen:  Within normal limits in size and appearance. Adrenals/Urinary Tract: Normal appearance of the adrenal glands. Bilateral Bosniak class 1 kidney cysts noted. The largest is in the right kidney measuring 1.7 cm, image 58/15. No follow-up imaging recommended. Stomach/Bowel: Visualized portions within the abdomen are unremarkable. Vascular/Lymphatic: Normal caliber of the abdominal aorta. Upper abdominal vascularity is patent. No enlarged upper abdominal lymph nodes. Other:  None. Musculoskeletal: No suspicious bone lesions identified. IMPRESSION: 1. Compared with the MRI from 07/05/2020 there has been interval development of two rim enhancing lesions which are moderately suspicious for metastatic disease. Consider more definitive characterization with either PET-CT or tissue sampling. 2. Additional enhancing liver lesions are unchanged when compared with 07/05/2020 and are compatible with benign liver hemangiomas. 3. Bilateral Bosniak class 1 kidney cysts. No follow-up imaging recommended. Electronically Signed   By: Kerby Moors M.D.   On: 01/08/2023 09:41    [place] Clinician Interpretation: I have personally reviewed the radiological images as listed.  My interpretation, in the context of the patient's clinical presentation, is stable disease   Assessment/Plan Meningioma ([place])  Talijah Chesebro presents with clinical and radiographic syndrome consistent with skull base meningioma.  We can compare this lesion to an axial FLAIR image from 2020 (scan done for left sided numbness); it was present at that time, dimension has increased by 1-30m over the 3 years.    This is very unlikely to represent a breast cancer metastasis.  Headaches are more likely secondary to recent sleep issues and elevated stress.  We reviewed sleep hygiene protocols with her.  We ask that  Joann Pavloffreturn to clinic in 6 months following next brain MRI, or sooner as needed.  We spent twenty additional minutes teaching regarding the natural history, biology, and historical experience in the treatment of neurologic complications of cancer.   We appreciate the opportunity to participate in the care of [place]ara Meadows   All questions were answered. The patient knows to call the clinic with any problems, questions or concerns. No barriers to  learning were detected.  The total time spent in the encounter was 40 minutes and more than 50% was on counseling and review of test results   Ventura Sellers, MD Medical Director of Neuro-Oncology [facility] at [place] 01/30/23 2:47 PM

## 2023-01-30 NOTE — H&P (Signed)
Chief Complaint: Patient was seen in consultation today for liver lesion  Referring Physician(s): Huxley  Supervising Physician: Jacqulynn Cadet  Patient Status: [facility] - Out-pt  History of Present Illness: Joann Meadows is a 47 y.o. female with a past medical history significant for endometriosis, AML (1999), meningioma, DCIS of the right breast who presents today for liver lesion biopsy. Joann Meadows was first diagnosed with DCIS of the right breast in March 2020 and underwent lumpectomy. She was started on anti-estrogen oral therapy and radiation. She was found to have recurrent cancer in late 2022 and has been followed closely by oncology. CT chest/abd/pelvis w/contrast was obtained 12/09/22 which showed:  1. Status post right mastectomy and axillary node dissection. 2. Multiple liver lesions, primarily similar and indicative of cysts and hemangiomas on MRI of 07/05/2020. There is a new or enlarged anterior right hepatic lobe lesion which is also favored to represent a hemangioma. This could either be re-evaluated on follow-up or more entirely characterized with pre and post contrast abdominal MRI. 3. Otherwise, no evidence of metastatic disease within the chest, abdomen, or pelvis. 4. Fibroid uterus. 5. Similar bilateral pulmonary nodules sinceCT of 10/27/2019, consistent with a benign etiology.  Follow up liver MRI on 01/08/23 showed:  1. Compared with the MRI from 07/05/2020 there has been interval development of two rim enhancing lesions which are moderately suspicious for metastatic disease. Consider more definitive characterization with either PET-CT or tissue sampling. 2. Additional enhancing liver lesions are unchanged when compared with 07/05/2020 and are compatible with benign liver hemangiomas. 3. Bilateral Bosniak class 1 kidney cysts. No follow-up imaging recommended.  PET scan 01/22/23 showed:  1. There are 2 tracer avid lesions within the right lobe  of liver which compatible with metastatic disease. 2. Choose bilateral and symmetric increased uptake within the pharyngeal tonsils is favored to represent physiologic activity. There is also a left level 2 lymph node which exhibits mild tracer uptake and is favored to represent reactive adenopathy. 3. Small pulmonary nodules are unchanged from 10/27/2019 compatible with benign pulmonary nodules. 4. Enlarged fibroid uterus.  Request to IR for liver lesion biopsy - patient history and imaging reviewed by Dr. Earleen Newport who approved patient for US guided liver lesion biopsy.  Past Medical History:  Diagnosis Date   Blood transfusion without reported diagnosis    leaukemia 18 years ago   Breast cancer ([place]) 2020   Right Breast Cancer   Endometriosis    Family history of colon cancer    Family history of leukemia    Fibroid    Hx of radiation therapy 06/2019   Leukemia ([place])    20 years ago   Personal history of radiation therapy 2020   Right Breast Cancer   PONV (postoperative nausea and vomiting)    pt states she vomits after colonoscopies    Past Surgical History:  Procedure Laterality Date   BREAST EXCISIONAL BIOPSY Left 02/11/2019   [facility]   BREAST LUMPECTOMY Right 02/11/2019   BREAST LUMPECTOMY WITH RADIOACTIVE SEED LOCALIZATION Bilateral 02/11/2019   Procedure: BILATERAL BREAST LUMPECTOMIES WITH BILATERAL RADIOACTIVE SEED LOCALIZATION;  Surgeon: Donnie Mesa, MD;  Location: [facility];  Service: General;  Laterality: Bilateral;   MASTECTOMY Right 11/2021    Allergies: Tamoxifen citrate  Medications: Prior to Admission medications   Medication Sig Start Date End Date Taking? Authorizing Provider  ALPRAZolam (XANAX) 0.25 MG tablet Take 0.25 mg by mouth daily as needed. Patient not taking: Reported on 01/30/2023 01/14/23   [provider]  hydrochlorothiazide (HYDRODIURIL) 25 MG tablet TAKE 1 TABLET BY MOUTH EVERY DAY IN THE MORNING FOR 30 DAYS ONCE A  DAY 11/04/22   [provider]  ibuprofen (ADVIL) 200 MG tablet Take 2 tablets by mouth every 12 (twelve) hours as needed. Patient not taking: Reported on 01/30/2023    [provider]  ondansetron (ZOFRAN-ODT) 4 MG disintegrating tablet Take 4 mg by mouth every 8 (eight) hours as needed. Patient not taking: Reported on 01/30/2023 10/23/22   [provider]  VITAMIN D Meadows Take 5,000 mg by mouth daily.    [provider]     Family History  Problem Relation Age of Onset   Colon cancer Mother 9       d. 31   Hypertension Father    Diabetes Maternal Aunt    Diabetes Maternal Uncle    Mental retardation Maternal Uncle    Diabetes Paternal Aunt    Diabetes Maternal Grandmother    Stroke Paternal Grandmother        c. 47   Leukemia Paternal Grandfather        d. 36; ?CLL   Leukemia Cousin 4       mat first cousin   Lymphoma Cousin 71       pat first cousin    Social History   Socioeconomic History   Marital status: Single    Spouse name: Not on file   Number of children: 2   Years of education: Not on file   Highest education level: High school graduate  Occupational History   Not on file  Tobacco Use   Smoking status: Former    Years: 10.00    Types: Cigarettes    Quit date: 01/25/2015    Years since quitting: 8.0   Smokeless tobacco: Never  Vaping Use   Vaping Use: Never used  Substance and Sexual Activity   Alcohol use: No   Drug use: No   Sexual activity: Yes    Birth control/protection: None  Other Topics Concern   Not on file  Social History Narrative   Lives with children   Social Determinants of Health   Financial Resource Strain: Not on file  Food Insecurity: No Food Insecurity (10/03/2022)   Hunger Vital Sign    Worried About Running Out of Food in the Last Year: Never true    Ran Out of Food in the Last Year: Never true  Transportation Needs: No Transportation Needs (10/03/2022)   PRAPARE - Radiographer, therapeutic (Medical): No    Lack of Transportation (Non-Medical): No  Physical Activity: Not on file  Stress: Not on file  Social Connections: Not on file     Review of Systems: A 12 point ROS discussed and pertinent positives are indicated in the HPI above.  All other systems are negative.  Review of Systems  Constitutional:  Negative for chills and fever.  Respiratory:  Negative for cough and shortness of breath.   Cardiovascular:  Negative for chest pain.  Gastrointestinal:  Negative for abdominal pain, diarrhea, nausea and vomiting.  Musculoskeletal:  Negative for back pain.  Neurological:  Negative for dizziness and headaches.    Vital Signs: BP (!) 138/92   Pulse 81   Temp 98.2 F (36.8 C) (Oral)   Resp 16   Ht '5\' 5"'$  (1.651 m)   Wt 167 lb (75.8 kg)   LMP 01/07/2023 (Approximate)   SpO2 100%   BMI 27.79  kg/m   Physical Exam Vitals reviewed.  Constitutional:      General: She is not in acute distress. HENT:     Head: Normocephalic.     Mouth/Throat:     Mouth: Mucous membranes are moist.     Pharynx: Oropharynx is clear. No oropharyngeal exudate or posterior oropharyngeal erythema.  Eyes:     General: No scleral icterus. Cardiovascular:     Rate and Rhythm: Normal rate and regular rhythm.  Pulmonary:     Effort: Pulmonary effort is normal.     Breath sounds: Normal breath sounds.  Abdominal:     General: There is no distension.     Palpations: Abdomen is soft.     Tenderness: There is no abdominal tenderness.  Skin:    General: Skin is warm and dry.     Coloration: Skin is not jaundiced.  Neurological:     Mental Status: She is alert and oriented to person, place, and time.  Psychiatric:        Mood and Affect: Mood normal.        Behavior: Behavior normal.        Thought Content: Thought content normal.        Judgment: Judgment normal.      MD Evaluation Airway: WNL Heart: WNL Abdomen: WNL Chest/ Lungs: WNL ASA  Classification:  3 Mallampati/Airway Score: One   Imaging: MR Brain W Wo Contrast  Result Date: 01/23/2023 CLINICAL DATA:  Metastatic disease evaluation. Right-sided headaches. History of metastatic breast cancer. EXAM: MRI HEAD WITHOUT AND WITH CONTRAST TECHNIQUE: Multiplanar, multiecho pulse sequences of the brain and surrounding structures were obtained without and with intravenous contrast. CONTRAST:  7.5 mL Vueway COMPARISON:  Head MRI 06/15/2019 FINDINGS: Brain: There is no evidence of an acute infarct, intracranial hemorrhage, midline shift, or extra-axial fluid collection. The ventricles and sulci are normal. The brain parenchyma itself is normal in signal. There is a partially empty sella. There is a homogeneously enhancing extra-axial mass measuring 10 x 5 mm with a broad dural interface at the anterior aspect of the right tentorial leaflet. This was less conspicuous on the prior unenhanced head MRI and has slightly enlarged in the interim. There is associated subtle flattening of the right lateral contour of the pons without brain edema. No other abnormal intracranial enhancement is identified. Vascular: Major intracranial vascular flow voids are preserved. Skull and upper cervical spine: Unremarkable bone marrow signal. Sinuses/Orbits: Unremarkable orbits. Mucous retention cysts in the left sphenoid sinus and both maxillary sinuses. Clear mastoid air cells. Other: None. IMPRESSION: 1. 10 mm anterior right tentorial meningioma. 2. No evidence of intracranial metastases. Electronically Signed   By: Logan Bores M.D.   On: 01/23/2023 13:06   NM PET Image Initial (PI) Skull Base To Thigh  Result Date: 01/22/2023 CLINICAL DATA:  Initial Treatment strategy for breast cancer. EXAM: NUCLEAR MEDICINE PET SKULL BASE TO THIGH TECHNIQUE: 8.4 mCi F-18 FDG was injected intravenously. Full-ring PET imaging was performed from the skull base to thigh after the radiotracer. CT data was obtained and used for attenuation  correction and anatomic localization. Fasting blood glucose: 92 mg/dl COMPARISON:  CT 12/09/2022 and MRI abdomen 12/07/2022 FINDINGS: Mediastinal blood pool activity: SUV max 3.05 Liver activity: SUV max NA NECK: Bilateral and symmetric increased uptake within the pharyngeal tonsils identified. There is also a left level 2 lymph node which exhibits mild tracer uptake within SUV max 3.40. Incidental CT findings: None. CHEST: No tracer avid mediastinal, hilar, or  axillary lymph nodes. No tracer avid pulmonary nodules. Incidental CT findings: 4 mm right upper lobe lung nodule is too small to characterize by PET-CT, image 26/7. This is unchanged when compared with remote CT of the chest from 10/27/2019 compatible with a benign nodule. Similarly, within the right base there is a 5 mm nodule, image 46/7. This is also unchanged from 10/27/2019 compatible with a benign nodule. ABDOMEN/PELVIS: Within the anterior dome of right lobe of liver there is a 2.5 x 2.3 cm low-density lesion which has an SUV max of 9.72, image 84/4. On 01/07/2023 this measured 2.2 x 2.0 cm Within segment 5, adjacent to the gallbladder there is a 2.4 x 2.0 cm low-density lesion which has an SUV max of 7.40, image 106/4. On 01/07/2023 this measured 2.2 x 1.8 cm. No tracer avid abdominopelvic lymph nodes.  No ascites identified. No abnormal uptake within the pancreas, spleen or adrenal glands. Incidental CT findings: Increased uptake within bilateral adnexal regions noted which is favored to represent physiologic activity. Enlarged fibroid uterus identified. SKELETON: No focal hypermetabolic activity to suggest skeletal metastasis. Incidental CT findings: None. IMPRESSION: 1. There are 2 tracer avid lesions within the right lobe of liver which compatible with metastatic disease. 2. Choose bilateral and symmetric increased uptake within the pharyngeal tonsils is favored to represent physiologic activity. There is also a left level 2 lymph node which  exhibits mild tracer uptake and is favored to represent reactive adenopathy. 3. Small pulmonary nodules are unchanged from 10/27/2019 compatible with benign pulmonary nodules. 4. Enlarged fibroid uterus. Electronically Signed   By: Kerby Moors M.D.   On: 01/22/2023 08:47   MR LIVER W WO CONTRAST  Result Date: 01/08/2023 CLINICAL DATA:  Evaluate liver lesions.  History of breast cancer. EXAM: MRI ABDOMEN WITHOUT AND WITH CONTRAST TECHNIQUE: Multiplanar multisequence MR imaging of the abdomen was performed both before and after the administration of intravenous contrast. CONTRAST:  7.5 cc Vueway COMPARISON:  MRI from 07/05/2020 FINDINGS: Lower chest: No acute findings. Hepatobiliary: Within the dome of liver there is a mildly T2 hyperintense, T1 hypointense lesion which on the postcontrast images exhibits early contiguous, rim hyperenhancement measuring 2.3 x 2.2 cm, image 12/13. On 12/09/2022 this measured 1.6 x 1.6 cm. On the delayed images there is persistent and progressive internal enhancement. Within segment 5 there is a new rim enhancing lesion which measures 1.8 x 1.8 cm, image 49/13. On 12/09/2022 this measured 1.4 x 1.5 cm. This also exhibits persistent peripheral enhancement with some signs of delayed fill-in. Within segment 2 of the left hepatic lobe there is a 2.4 x 1.5 cm, T2 hyperintense lesion which shows early peripheral discontiguous enhancement with delayed fill-in compatible with the classic appearance of a benign hemangioma, image 5/9. This is unchanged from the prior study. Within the posterior dome of the liver there is a T2 hyperintense lesion measuring 1.9 x 1.7 cm, image 17/15. This is also unchanged from the previous exam and also as the classic imaging appearance of benign hemangioma. Similar appearance of hemangioma within the lateral dome of liver measuring 1.7 x 1.1 cm, image 8/20. Central right lobe of liver flash fill hemangioma measures 1.3 cm, image 17/13. Several small cysts  are also identified measuring up to 3 mm. Gallbladder appears normal.  No bile duct dilatation. Pancreas: No mass, inflammatory changes, or other parenchymal abnormality identified. Spleen:  Within normal limits in size and appearance. Adrenals/Urinary Tract: Normal appearance of the adrenal glands. Bilateral Bosniak class 1 kidney cysts  noted. The largest is in the right kidney measuring 1.7 cm, image 58/15. No follow-up imaging recommended. Stomach/Bowel: Visualized portions within the abdomen are unremarkable. Vascular/Lymphatic: Normal caliber of the abdominal aorta. Upper abdominal vascularity is patent. No enlarged upper abdominal lymph nodes. Other:  None. Musculoskeletal: No suspicious bone lesions identified. IMPRESSION: 1. Compared with the MRI from 07/05/2020 there has been interval development of two rim enhancing lesions which are moderately suspicious for metastatic disease. Consider more definitive characterization with either PET-CT or tissue sampling. 2. Additional enhancing liver lesions are unchanged when compared with 07/05/2020 and are compatible with benign liver hemangiomas. 3. Bilateral Bosniak class 1 kidney cysts. No follow-up imaging recommended. Electronically Signed   By: Kerby Moors M.D.   On: 01/08/2023 09:41    Labs:  CBC: Recent Labs    01/23/23 0904  WBC 6.6  HGB 15.1*  HCT 45.1  PLT 268    COAGS: No results for input(s): "INR", "APTT" in the last 8760 hours.  BMP: Recent Labs    01/23/23 0904  NA 139  K 3.6  CL 99  CO2 32  GLUCOSE 99  BUN 14  CALCIUM 9.3  CREATININE 0.77  GFRNONAA >60    LIVER FUNCTION TESTS: Recent Labs    01/23/23 0904  BILITOT 0.5  AST 14*  ALT 14  ALKPHOS 63  PROT 7.5  ALBUMIN 4.8    TUMOR MARKERS: No results for input(s): "AFPTM", "CEA", "CA199", "CHROMGRNA" in the last 8760 hours.  Assessment and Plan:  47 y/o F with history of AML and DCIS s/p right lumpectomy found to have multiple new liver lesions on  recent imaging who presents today for image guided liver lesion biopsy. Patient history and imaging reviewed by Dr. Earleen Newport who approves procedure which will be performed by Dr. Laurence Ferrari.  Risks and benefits of liver lesion biopsy was discussed with the patient and/or patient's family including, but not limited to bleeding, infection, damage to adjacent structures or low yield requiring additional tests.  All of the questions were answered and there is agreement to proceed.  Consent signed and in chart.  Thank you for this interesting consult.  I greatly enjoyed meeting Joann Meadows and look forward to participating in their care.  A copy of this report was sent to the requesting provider on this date.  Electronically Signed: Joaquim Nam, PA-C 01/30/2023, 3:52 PM   I spent a total of  30 Minutes  in face to face in clinical consultation, greater than 50% of which was counseling/coordinating care for liver lesion biopsy.

## 2023-01-31 ENCOUNTER — Ambulatory Visit (HOSPITAL_COMMUNITY)
Admission: RE | Admit: 2023-01-31 | Discharge: 2023-01-31 | Disposition: A | Discharge status: Home / Self Care | Payer: BC Managed Care – PPO | Source: Ambulatory Visit | Attending: Hematology and Oncology | Admitting: Hematology and Oncology

## 2023-01-31 ENCOUNTER — Encounter (HOSPITAL_COMMUNITY): Payer: Self-pay

## 2023-01-31 DIAGNOSIS — K769 Liver disease, unspecified: Secondary | ICD-10-CM | POA: Diagnosis not present

## 2023-01-31 DIAGNOSIS — C787 Secondary malignant neoplasm of liver and intrahepatic bile duct: Secondary | ICD-10-CM | POA: Insufficient documentation

## 2023-01-31 DIAGNOSIS — C801 Malignant (primary) neoplasm, unspecified: Secondary | ICD-10-CM | POA: Diagnosis not present

## 2023-01-31 DIAGNOSIS — K7689 Other specified diseases of liver: Secondary | ICD-10-CM | POA: Diagnosis not present

## 2023-01-31 DIAGNOSIS — D0511 Intraductal carcinoma in situ of right breast: Secondary | ICD-10-CM | POA: Insufficient documentation

## 2023-01-31 LAB — CBC WITH DIFFERENTIAL/PLATELET
Abs Immature Granulocytes: 0.02 10*3/uL (ref 0.00–0.07)
Basophils Absolute: 0.1 10*3/uL (ref 0.0–0.1)
Basophils Relative: 1 %
Eosinophils Absolute: 0.2 10*3/uL (ref 0.0–0.5)
Eosinophils Relative: 3 %
HCT: 41.5 % (ref 36.0–46.0)
Hemoglobin: 14 g/dL (ref 12.0–15.0)
Immature Granulocytes: 0 %
Lymphocytes Relative: 31 %
Lymphs Abs: 2.1 10*3/uL (ref 0.7–4.0)
MCH: 29.7 pg (ref 26.0–34.0)
MCHC: 33.7 g/dL (ref 30.0–36.0)
MCV: 88.1 fL (ref 80.0–100.0)
Monocytes Absolute: 0.6 10*3/uL (ref 0.1–1.0)
Monocytes Relative: 8 %
Neutro Abs: 3.9 10*3/uL (ref 1.7–7.7)
Neutrophils Relative %: 57 %
Platelets: 287 10*3/uL (ref 150–400)
RBC: 4.71 MIL/uL (ref 3.87–5.11)
RDW: 13.2 % (ref 11.5–15.5)
WBC: 6.9 10*3/uL (ref 4.0–10.5)
nRBC: 0 % (ref 0.0–0.2)

## 2023-01-31 LAB — PROTIME-INR
INR: 1 (ref 0.8–1.2)
Prothrombin Time: 12.8 seconds (ref 11.4–15.2)

## 2023-01-31 MED ORDER — MIDAZOLAM HCL 2 MG/2ML IJ SOLN
INTRAMUSCULAR | Status: AC | PRN
  Administered 2023-01-31 (×2): .5 mg via INTRAVENOUS
  Administered 2023-01-31: 1 mg via INTRAVENOUS

## 2023-01-31 MED ORDER — GELATIN ABSORBABLE 12-7 MM EX MISC
1.0000 | Freq: Once | CUTANEOUS | Status: AC
  Administered 2023-01-31: 1 via TOPICAL

## 2023-01-31 MED ORDER — MIDAZOLAM HCL 2 MG/2ML IJ SOLN
INTRAMUSCULAR | Status: AC
  Filled 2023-01-31: qty 2

## 2023-01-31 MED ORDER — FENTANYL CITRATE (PF) 100 MCG/2ML IJ SOLN
INTRAMUSCULAR | Status: AC
  Filled 2023-01-31: qty 2

## 2023-01-31 MED ORDER — FENTANYL CITRATE (PF) 100 MCG/2ML IJ SOLN
INTRAMUSCULAR | Status: AC | PRN
  Administered 2023-01-31: 25 ug via INTRAVENOUS
  Administered 2023-01-31: 50 ug via INTRAVENOUS
  Administered 2023-01-31: 25 ug via INTRAVENOUS

## 2023-01-31 MED ORDER — LIDOCAINE HCL (PF) 1 % IJ SOLN
5.0000 mL | Freq: Once | INTRAMUSCULAR | Status: AC
  Administered 2023-01-31: 5 mL via INTRADERMAL

## 2023-01-31 MED ORDER — SODIUM CHLORIDE 0.9 % IV SOLN: INTRAVENOUS | Status: DC

## 2023-01-31 NOTE — Procedures (Signed)
Interventional Radiology Procedure Note  Procedure: US guided biopsy of liver lesion in segment 5.  Complications: None  Estimated Blood Loss: None  Recommendations: - Bedrest x 2 hrs - DC home   Signed,  Criselda Peaches, MD

## 2023-01-31 NOTE — Progress Notes (Incomplete)
Patient Care Team: Lujean Amel, MD as PCP - General (Family Medicine) Rockwell Germany, RN as Oncology Nurse Navigator [name], RN as Oncology Nurse Navigator  DIAGNOSIS: No diagnosis found.  SUMMARY OF ONCOLOGIC HISTORY: Oncology History  Ductal carcinoma in situ (DCIS) of right breast  01/25/2019 Initial Diagnosis   Screening mammogram showed bilateral calcifications right breast 5 mm, pleomorphic.  Left breast 1.6 cm punctate, smaller group of calcifications spanning 3 mm.  Right lower outer quadrant biopsy revealed low-grade DCIS ER 90%, PR 70%; biopsy of left breast calcifications ALH and PASH   01/31/2019 Genetic Testing   ATM c.8495G>A and RECQL4 c.2587G>A VUS identified on the 9-gene STAT panel and Multi-cancer panel through [facility].  The STAT Breast cancer panel offered by [facility] includes sequencing and rearrangement analysis for the following 9 genes:  ATM, BRCA1, BRCA2, CDH1, CHEK2, PALB2, PTEN, STK11 and TP53.   The Multi-Gene Panel offered by [facility] includes sequencing and/or deletion duplication testing of the following 84 genes: AIP, ALK, APC, ATM, AXIN2,BAP1,  BARD1, BLM, BMPR1A, BRCA1, BRCA2, BRIP1, CASR, CDC73, CDH1, CDK4, CDKN1B, CDKN1C, CDKN2A (p14ARF), CDKN2A (p16INK4a), CEBPA, CHEK2, CTNNA1, DICER1, DIS3L2, EGFR (c.2369C>T, p.Thr790Met variant only), EPCAM (Deletion/duplication testing only), FH, FLCN, GATA2, GPC3, GREM1 (Promoter region deletion/duplication testing only), HOXB13 (c.251G>A, p.Gly84Glu), HRAS, KIT, MAX, MEN1, MET, MITF (c.952G>A, p.Glu318Lys variant only), MLH1, MSH2, MSH3, MSH6, MUTYH, NBN, NF1, NF2, NTHL1, PALB2, PDGFRA, PHOX2B, PMS2, POLD1, POLE, POT1, PRKAR1A, PTCH1, PTEN, RAD50, RAD51C, RAD51D, RB1, RECQL4, RET, RUNX1, SDHAF2, SDHA (sequence changes only), SDHB, SDHC, SDHD, SMAD4, SMARCA4, SMARCB1, SMARCE1, STK11, SUFU, TERC, TERT, TMEM127, TP53, TSC1, TSC2, VHL, WRN and WT1.  he report date is February 02, 2019.   02/11/2019 Surgery   RT  lumpectomy: DICS Intermediate grade, Margins Neg, ER 90%, PR 70%; Lt Lumpectomy: UDH   02/26/2019 -  Anti-estrogen oral therapy   Tamoxifen '20mg'$  daily, stopped because of depression and facial numbness   05/20/2019 - 06/17/2019 Radiation Therapy   Adjuvant XRT   08/15/2021 Relapse/Recurrence   MRI surveillance detected Right breast calcifications 5 cm, Ant biopsy: IG DCIS with necrosis, ER 40%, PR 0%, Posterior biopsy: Intermediate grade DCIS with [facility]   08/19/2022 Cancer Staging   Staging form: Breast, AJCC 8th Edition - Clinical: Stage IA (cT67m, cN0, cM0, G3, ER-, PR-, HER2+) - Signed by GNicholas Lose MD on 08/19/2022 Histologic grading system: 3 grade system   [place]  09/[place] Initial Diagnosis   [place] with 2 induction regimens followed by 6 consolidation regimens, remission     CHIEF COMPLIANT: Follow-up right breast cancer surveillance   INTERVAL HISTORY: Joann Meadows a 47y.o. with above-mentioned history of right breast DCIS for which she underwent a lumpectomy. She presents to the clinic today for a follow-up.     ALLERGIES:  is allergic to tamoxifen citrate.  MEDICATIONS:  Current Outpatient Medications  Medication Sig Dispense Refill   ALPRAZolam (XANAX) 0.25 MG tablet Take 0.25 mg by mouth daily as needed. (Patient not taking: Reported on 01/30/2023)     hydrochlorothiazide (HYDRODIURIL) 25 MG tablet TAKE 1 TABLET BY MOUTH EVERY DAY IN THE MORNING FOR 30 DAYS ONCE A DAY     ibuprofen (ADVIL) 200 MG tablet Take 2 tablets by mouth every 12 (twelve) hours as needed. (Patient not taking: Reported on 01/30/2023)     ondansetron (ZOFRAN-ODT) 4 MG disintegrating tablet Take 4 mg by mouth every 8 (eight) hours as needed. (Patient not  taking: Reported on 01/30/2023)     VITAMIN D PO Take 5,000 mg by mouth daily.     No current facility-administered medications for this visit.   Facility-Administered Medications  Ordered in Other Visits  Medication Dose Route Frequency Provider Last Rate Last Admin   0.9 %  sodium chloride infusion   Intravenous Continuous Laymond Purser, Alysia Penna, NP       fentaNYL (SUBLIMAZE) 100 MCG/2ML injection            midazolam (VERSED) 2 MG/2ML injection             PHYSICAL EXAMINATION: ECOG PERFORMANCE STATUS: {CHL ONC ECOG PS:[id number]}  There were no vitals filed for this visit. There were no vitals filed for this visit.  BREAST:*** No palpable masses or nodules in either right or left breasts. No palpable axillary supraclavicular or infraclavicular adenopathy no breast tenderness or nipple discharge. (exam performed in the presence of a chaperone)  LABORATORY DATA:  I have reviewed the data as listed    Latest Ref Rng & Units 01/23/2023    9:04 AM 10/27/2019    3:28 PM 06/16/2019    2:38 PM  CMP  Glucose 70 - 99 mg/dL 99  96  117   BUN 6 - 20 mg/dL '14  19  14   '$ Creatinine 0.44 - 1.00 mg/dL 0.77  0.84  0.82   Sodium 135 - 145 mmol/Joann 139  141  142   Potassium 3.5 - 5.1 mmol/Joann 3.6  3.9  4.4   Chloride 98 - 111 mmol/Joann 99  107  109   CO2 22 - 32 mmol/Joann 32  25  24   Calcium 8.9 - 10.3 mg/dL 9.3  9.1  9.1   Total Protein 6.5 - 8.1 g/dL 7.5  7.2  6.9   Total Bilirubin 0.3 - 1.2 mg/dL 0.5  0.3  0.4   Alkaline Phos 38 - 126 U/Joann 63  59  45   AST 15 - 41 U/Joann '14  13  11   '$ ALT 0 - 44 U/Joann '14  15  12     '$ Lab Results  Component Value Date   WBC 6.9 01/31/2023   HGB 14.0 01/31/2023   HCT 41.5 01/31/2023   MCV 88.1 01/31/2023   PLT 287 01/31/2023   NEUTROABS 3.9 01/31/2023    ASSESSMENT & PLAN:  No problem-specific Assessment & Plan notes found for this encounter.    No orders of the defined types were placed in this encounter.  The patient has a good understanding of the overall plan. she agrees with it. she will call with any problems that may develop before the next visit here. Total time spent: 30 mins including face to face time and time spent for planning,  charting and co-ordination of care   Suzzette Righter, [place] 01/31/23    I Gardiner Coins am acting as a Education administrator for [employer]  ***

## 2023-02-04 ENCOUNTER — Encounter: Payer: Self-pay | Admitting: Rehabilitation

## 2023-02-04 ENCOUNTER — Ambulatory Visit: Payer: BC Managed Care – PPO | Admitting: Rehabilitation

## 2023-02-04 ENCOUNTER — Ambulatory Visit (HOSPITAL_COMMUNITY): Payer: BC Managed Care – PPO

## 2023-02-06 ENCOUNTER — Other Ambulatory Visit: Payer: Self-pay

## 2023-02-06 ENCOUNTER — Telehealth: Payer: Self-pay

## 2023-02-06 ENCOUNTER — Encounter: Payer: Self-pay | Admitting: Hematology and Oncology

## 2023-02-06 ENCOUNTER — Encounter: Payer: BC Managed Care – PPO | Admitting: Rehabilitation

## 2023-02-06 ENCOUNTER — Inpatient Hospital Stay (HOSPITAL_BASED_OUTPATIENT_CLINIC_OR_DEPARTMENT_OTHER): Payer: BC Managed Care – PPO | Admitting: Hematology and Oncology

## 2023-02-06 ENCOUNTER — Inpatient Hospital Stay: Payer: BC Managed Care – PPO

## 2023-02-06 VITALS — BP 137/94 | HR 82 | Temp 97.5°F | Resp 18 | Ht 65.0 in | Wt 166.9 lb

## 2023-02-06 DIAGNOSIS — C787 Secondary malignant neoplasm of liver and intrahepatic bile duct: Secondary | ICD-10-CM

## 2023-02-06 DIAGNOSIS — K769 Liver disease, unspecified: Secondary | ICD-10-CM

## 2023-02-06 DIAGNOSIS — Z5181 Encounter for therapeutic drug level monitoring: Secondary | ICD-10-CM | POA: Diagnosis not present

## 2023-02-06 DIAGNOSIS — Z9011 Acquired absence of right breast and nipple: Secondary | ICD-10-CM | POA: Diagnosis not present

## 2023-02-06 DIAGNOSIS — R0781 Pleurodynia: Secondary | ICD-10-CM | POA: Diagnosis not present

## 2023-02-06 DIAGNOSIS — C50919 Malignant neoplasm of unspecified site of unspecified female breast: Secondary | ICD-10-CM | POA: Diagnosis not present

## 2023-02-06 DIAGNOSIS — C9201 Acute myeloblastic leukemia, in remission: Secondary | ICD-10-CM | POA: Diagnosis not present

## 2023-02-06 DIAGNOSIS — D1803 Hemangioma of intra-abdominal structures: Secondary | ICD-10-CM | POA: Diagnosis not present

## 2023-02-06 DIAGNOSIS — I10 Essential (primary) hypertension: Secondary | ICD-10-CM | POA: Diagnosis not present

## 2023-02-06 DIAGNOSIS — Z86000 Personal history of in-situ neoplasm of breast: Secondary | ICD-10-CM | POA: Diagnosis not present

## 2023-02-06 DIAGNOSIS — R519 Headache, unspecified: Secondary | ICD-10-CM | POA: Diagnosis not present

## 2023-02-06 DIAGNOSIS — C50911 Malignant neoplasm of unspecified site of right female breast: Secondary | ICD-10-CM | POA: Diagnosis not present

## 2023-02-06 DIAGNOSIS — Z79899 Other long term (current) drug therapy: Secondary | ICD-10-CM | POA: Diagnosis not present

## 2023-02-06 DIAGNOSIS — Z923 Personal history of irradiation: Secondary | ICD-10-CM | POA: Diagnosis not present

## 2023-02-06 DIAGNOSIS — Z170 Estrogen receptor positive status [ER+]: Secondary | ICD-10-CM | POA: Diagnosis not present

## 2023-02-06 DIAGNOSIS — Z5112 Encounter for antineoplastic immunotherapy: Secondary | ICD-10-CM | POA: Diagnosis not present

## 2023-02-06 LAB — SURGICAL PATHOLOGY

## 2023-02-06 MED ORDER — GOSERELIN ACETATE 3.6 MG ~~LOC~~ IMPL
3.6000 mg | DRUG_IMPLANT | Freq: Once | SUBCUTANEOUS | Status: AC
  Administered 2023-02-06: 3.6 mg via SUBCUTANEOUS
  Filled 2023-02-06: qty 3.6

## 2023-02-06 MED ORDER — LETROZOLE 2.5 MG PO TABS: 2.5000 mg | ORAL_TABLET | Freq: Every day | ORAL | 3 refills | Status: DC

## 2023-02-06 NOTE — Assessment & Plan Note (Signed)
02/11/19: RT lumpectomy: DICS Intermediate grade, Margins Neg, ER 90%, PR 70%; Lt Lumpectomy: UDH status post radiation, could not tolerate tamoxifen recurrence: 08/15/2021:MRI surveillance detected Right breast calcifications 5 cm, Ant biopsy: IG DCIS with necrosis, ER 40%, PR 0%, Posterior biopsy: Intermediate grade DCIS with [facility]  11/29/2021: Right mastectomy: High-grade DCIS with microinvasion, ER 0%, PR 0%, HER2 positive, 0/1 lymph node negative  T1c MIC, N0: Stage Ia   Patient had second opinion at [facility] with Dr. Harden Mo who agreed with her plan of no systemic chemotherapy   Breast cancer surveillance: 1.  Mammogram 08/22/2022: Left breast benign breast density category B  2.  CT CAP 12/09/2022: Multiple liver lesions indicative of cysts and hemangiomas new right hepatic lobe lesion favored to be hemangioma otherwise no evidence of metastatic disease.  Radiology recommended an MRI of the liver. 3.  Liver MRI 01/08/2023: 2 New rim-enhancing lesions 2.3 cm (was 1.6 cm on 12/09/2022), 1.8 cm (measured 1.5 cm), benign hemangioma 2.4 cm and 1.9 cm and 1.3 cm unchanged 4.  PET CT scan 01/22/2023: Liver: 2.5 cm and 2.4 cm lesions ------------------------------------------------------------------------------------------------------------------------------------------------- 01/31/2023: Liver lesion biopsy: Metastatic adenocarcinoma to the liver consistent with breast primary positive for CK7 and GATA3, breast prognostic panel is pending, Caris molecular testing is pending  Counseling: Discussed at length about the significance of the diagnosis of metastatic breast cancer and that she would require lifelong treatment.  Treatment would depend on the prognostic panel.  We had previously discussed different possibilities for treatment options based upon the prognostic panel.  Meningioma: Follows with Dr. Mickeal Skinner Return to clinic once we have the breast prognostic panel available.

## 2023-02-06 NOTE — Addendum Note (Signed)
Addended by: Nicholas Lose on: 02/06/2023 05:33 PM   Modules accepted: Orders

## 2023-02-06 NOTE — Progress Notes (Signed)
START ON PATHWAY REGIMEN - Breast     Cycle 1: A cycle is 21 days:     Pertuzumab      Trastuzumab-xxxx      Docetaxel    Cycles 2 and beyond: A cycle is every 21 days:     Pertuzumab      Trastuzumab-xxxx      Docetaxel   **Always confirm dose/schedule in your pharmacy ordering system**  Patient Characteristics: Distant Metastases or Locoregional Recurrent Disease - Unresected or Locally Advanced Unresectable Disease Progressing after Neoadjuvant and Local Therapies, HER2 Positive, ER Negative, Chemotherapy, First Line Therapeutic Status: Distant Metastases HER2 Status: Positive (+) ER Status: Negative (-) PR Status: Negative (-) Line of Therapy: First Line Intent of Therapy: Curative Intent, Discussed with Patient

## 2023-02-06 NOTE — Telephone Encounter (Signed)
Called pt to give prognostic panel results per MD. She had several questions regarding how fast growing the cancer is and requests we submit auth to insurance ASAP. MD states he will see pt 3/19 and will enter appropriate orders for treatment at that time, then we will move forward with auth process and scheduling. She spoke with MD regarding this and understands the plan.

## 2023-02-06 NOTE — Telephone Encounter (Signed)
Pt called again in regards to appts. She will see MD 02/11/23 to discuss treatment and MD will enter orders then. Pt wants to know why she can not start treatment this week, and why we can not submit authorization for treatment today. Advised pt MD enters orders for treatment then we submit for auth. She verbalized anxiety about having young children and going through cancer again. Reassured pt we would submit for auth as soon as Dr Lindi Adie places orders. She knows to call with any further concerns.

## 2023-02-06 NOTE — Patient Instructions (Signed)
Goserelin Implant What is this medication? GOSERELIN (GOE se rel in) treats prostate cancer and breast cancer. It works by decreasing levels of the hormones testosterone and estrogen in the body. This prevents prostate and breast cancer cells from spreading or growing. It may also be used to treat endometriosis. This is a condition where the tissue that lines the uterus grows outside the uterus. It works by decreasing the amount of estrogen your body makes, which reduces heavy bleeding and pain. It can also be used to help thin the lining of the uterus before a surgery used to prevent or reduce heavy periods. This medicine may be used for other purposes; ask your health care provider or pharmacist if you have questions. COMMON BRAND NAME(S): Zoladex, Zoladex 3-Month What should I tell my care team before I take this medication? They need to know if you have any of these conditions: Bone problems Diabetes Heart disease History of irregular heartbeat or rhythm An unusual or allergic reaction to goserelin, other medications, foods, dyes, or preservatives Pregnant or trying to get pregnant Breastfeeding How should I use this medication? This medication is injected under the skin. It is given by your care team in a hospital or clinic setting. Talk to your care team about the use of this medication in children. Special care may be needed. Overdosage: If you think you have taken too much of this medicine contact a poison control center or emergency room at once. NOTE: This medicine is only for you. Do not share this medicine with others. What if I miss a dose? Keep appointments for follow-up doses. It is important not to miss your dose. Call your care team if you are unable to keep an appointment. What may interact with this medication? Do not take this medication with any of the following: Cisapride Dronedarone Pimozide Thioridazine This medication may also interact with the following: Other  medications that cause heart rhythm changes This list may not describe all possible interactions. Give your health care provider a list of all the medicines, herbs, non-prescription drugs, or dietary supplements you use. Also tell them if you smoke, drink alcohol, or use illegal drugs. Some items may interact with your medicine. What should I watch for while using this medication? Visit your care team for regular checks on your progress. Your symptoms may appear to get worse during the first weeks of this therapy. Tell your care team if your symptoms do not start to get better or if they get worse after this time. Using this medication for a long time may weaken your bones. If you smoke or frequently drink alcohol you may increase your risk of bone loss. A family history of osteoporosis, chronic use of medications for seizures (convulsions), or corticosteroids can also increase your risk of bone loss. The risk of bone fractures may be increased. Talk to your care team about your bone health. This medication may increase blood sugar. The risk may be higher in patients who already have diabetes. Ask your care team what you can do to lower your risk of diabetes while taking this medication. This medication should stop regular monthly menstruation in women. Tell your care team if you continue to menstruate. Talk to your care team if you wish to become pregnant or think you might be pregnant. This medication can cause serious birth defects if taken during pregnancy or for 12 weeks after stopping treatment. Talk to your care team about reliable forms of contraception. Do not breastfeed while taking this   medication. This medication may cause infertility. Talk to your care team if you are concerned about your fertility. What side effects may I notice from receiving this medication? Side effects that you should report to your care team as soon as possible: Allergic reactions--skin rash, itching, hives, swelling  of the face, lips, tongue, or throat Change in the amount of urine Heart attack--pain or tightness in the chest, shoulders, arms, or jaw, nausea, shortness of breath, cold or clammy skin, feeling faint or lightheaded Heart rhythm changes--fast or irregular heartbeat, dizziness, feeling faint or lightheaded, chest pain, trouble breathing High blood sugar (hyperglycemia)--increased thirst or amount of urine, unusual weakness or fatigue, blurry vision High calcium level--increased thirst or amount of urine, nausea, vomiting, confusion, unusual weakness or fatigue, bone pain Pain, redness, irritation, or bruising at the injection site Severe back pain, numbness or weakness of the hands, arms, legs, or feet, loss of coordination, loss of bowel or bladder control Stroke--sudden numbness or weakness of the face, arm, or leg, trouble speaking, confusion, trouble walking, loss of balance or coordination, dizziness, severe headache, change in vision Swelling and pain of the tumor site or lymph nodes Trouble passing urine Side effects that usually do not require medical attention (report to your care team if they continue or are bothersome): Change in sex drive or performance Headache Hot flashes Rapid or extreme change in emotion or mood Sweating Swelling of the ankles, hands, or feet Unusual vaginal discharge, itching, or odor This list may not describe all possible side effects. Call your doctor for medical advice about side effects. You may report side effects to FDA at 1-800-FDA-1088. Where should I keep my medication? This medication is given in a hospital or clinic. It will not be stored at home. NOTE: This sheet is a summary. It may not cover all possible information. If you have questions about this medicine, talk to your doctor, pharmacist, or health care provider.  2023 Elsevier/Gold Standard (2022-03-27 00:00:00)  

## 2023-02-06 NOTE — Telephone Encounter (Signed)
Caris requisition and all supporting documents faxed to [phone] per MD. Fax confirmation received.

## 2023-02-07 ENCOUNTER — Telehealth: Payer: Self-pay | Admitting: Hematology and Oncology

## 2023-02-07 NOTE — Telephone Encounter (Signed)
Scheduled and rescheduled appointment per WQ and room/resource. Patient is aware of all changes made to her upcoming appointments as well as new scheduled appointments.

## 2023-02-10 ENCOUNTER — Other Ambulatory Visit: Payer: Self-pay | Admitting: Hematology and Oncology

## 2023-02-10 DIAGNOSIS — H04123 Dry eye syndrome of bilateral lacrimal glands: Secondary | ICD-10-CM | POA: Diagnosis not present

## 2023-02-10 DIAGNOSIS — C50919 Malignant neoplasm of unspecified site of unspecified female breast: Secondary | ICD-10-CM

## 2023-02-10 DIAGNOSIS — H0102B Squamous blepharitis left eye, upper and lower eyelids: Secondary | ICD-10-CM | POA: Diagnosis not present

## 2023-02-10 DIAGNOSIS — H18832 Recurrent erosion of cornea, left eye: Secondary | ICD-10-CM | POA: Diagnosis not present

## 2023-02-10 DIAGNOSIS — H0102A Squamous blepharitis right eye, upper and lower eyelids: Secondary | ICD-10-CM | POA: Diagnosis not present

## 2023-02-10 MED ORDER — DEXAMETHASONE 4 MG PO TABS: 4.0000 mg | ORAL_TABLET | Freq: Every day | ORAL | 1 refills | Status: DC

## 2023-02-10 MED ORDER — LIDOCAINE-PRILOCAINE 2.5-2.5 % EX CREA: TOPICAL_CREAM | CUTANEOUS | 3 refills | Status: DC

## 2023-02-10 MED ORDER — ONDANSETRON HCL 8 MG PO TABS: 8.0000 mg | ORAL_TABLET | Freq: Three times a day (TID) | ORAL | 1 refills | Status: DC | PRN

## 2023-02-10 MED ORDER — PROCHLORPERAZINE MALEATE 10 MG PO TABS: 10.0000 mg | ORAL_TABLET | Freq: Four times a day (QID) | ORAL | 1 refills | Status: DC | PRN

## 2023-02-11 ENCOUNTER — Ambulatory Visit: Payer: BC Managed Care – PPO | Admitting: Hematology and Oncology

## 2023-02-11 ENCOUNTER — Encounter: Payer: Self-pay | Admitting: Rehabilitation

## 2023-02-11 ENCOUNTER — Ambulatory Visit: Payer: BC Managed Care – PPO | Admitting: Rehabilitation

## 2023-02-11 DIAGNOSIS — D0511 Intraductal carcinoma in situ of right breast: Secondary | ICD-10-CM | POA: Diagnosis not present

## 2023-02-11 DIAGNOSIS — Z9189 Other specified personal risk factors, not elsewhere classified: Secondary | ICD-10-CM | POA: Diagnosis not present

## 2023-02-11 DIAGNOSIS — R293 Abnormal posture: Secondary | ICD-10-CM | POA: Diagnosis not present

## 2023-02-11 DIAGNOSIS — Z483 Aftercare following surgery for neoplasm: Secondary | ICD-10-CM

## 2023-02-11 DIAGNOSIS — G8918 Other acute postprocedural pain: Secondary | ICD-10-CM

## 2023-02-11 DIAGNOSIS — M25611 Stiffness of right shoulder, not elsewhere classified: Secondary | ICD-10-CM

## 2023-02-11 DIAGNOSIS — M25612 Stiffness of left shoulder, not elsewhere classified: Secondary | ICD-10-CM

## 2023-02-11 NOTE — Therapy (Addendum)
 OUTPATIENT PHYSICAL THERAPY ONCOLOGY TREATMENT  Patient Name: Joann Meadows MRN: [id number] DOB:11-21-1976, 47 y.o., female Today's Date: 02/11/2023   PT End of Session - 02/11/23 1158     Visit Number 7    Number of Visits 13    Date for PT Re-Evaluation 02/18/23    PT Start Time 1200    PT Stop Time 1258    PT Time Calculation (min) 58 min    Activity Tolerance Patient tolerated treatment well    Behavior During Therapy [facility] for tasks assessed/performed                 Past Medical History:  Diagnosis Date   Blood transfusion without reported diagnosis    leaukemia 18 years ago   Breast cancer (HCC) 2020   Right Breast Cancer   Endometriosis    Family history of colon cancer    Family history of leukemia    Fibroid    Hx of radiation therapy 06/2019   Leukemia (HCC)    20 years ago   Personal history of radiation therapy 2020   Right Breast Cancer   PONV (postoperative nausea and vomiting)    pt states she vomits after colonoscopies   Past Surgical History:  Procedure Laterality Date   BREAST EXCISIONAL BIOPSY Left 02/11/2019   [facility]   BREAST LUMPECTOMY Right 02/11/2019   BREAST LUMPECTOMY WITH RADIOACTIVE SEED LOCALIZATION Bilateral 02/11/2019   Procedure: BILATERAL BREAST LUMPECTOMIES WITH BILATERAL RADIOACTIVE SEED LOCALIZATION;  Surgeon: Manus Rudd, MD;  Location: [facility];  Service: General;  Laterality: Bilateral;   MASTECTOMY Right 11/2021   Patient Active Problem List   Diagnosis Date Noted   Meningioma (HCC) 01/30/2023   Liver lesion 01/23/2023   COPD possible, would be Gold Group B 11/25/2019   DOE (dyspnea on exertion) 11/24/2019   Genetic testing 02/02/2019   Breast cancer metastasized to liver (HCC) 01/25/2019   AML (acute myeloid leukemia) (HCC) 01/25/2019   Family history of colon cancer    Family history of leukemia    Delivery normal 03/25/2017    PCP: [name], MD  REFERRING PROVIDER: Minus Breeding, NP  REFERRING DIAG: Rt breast cancer  THERAPY DIAG:  Stiffness of right shoulder, not elsewhere classified  Aftercare following surgery for neoplasm  Stiffness of left shoulder, not elsewhere classified  Post-mastectomy pain  At risk for lymphedema  Ductal carcinoma in situ (DCIS) of right breast  Abnormal posture  ONSET DATE: 11/29/21  SUBJECTIVE  SUBJECTIVE STATEMENT:  I have to start chemo on Friday.  Taxol, Herceptin and Perjeta.  I was doing better and then I had period cramps and my pain came back.  Not sore from the biopsy.    EVAL: Since I had my exchange I feel like I lost my motion again.  It was pretty good and then it started to get a little more stiff.  Especially when I clean.  Going to get an [place] on the breast and an MRI for the liver.   PERTINENT HISTORY:  Leukemia history, previous Rt breast lumpectomy and radiation in 2020. Rt mastectomy with expander placement and SLNB 0/1 LN positive on 11/29/21.  Reduction on the left and switch to implant on 10/23/22.   PAIN:  Are you having pain? 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 I have help.   OBJECTIVE OBSERVATION: Cording noted in Rt axilla and in seated along lateral breast  PALPATION: Rt: +2 ttp Rt latissimus/ lateral chest, rhomboids Lt: +1-2 ttp latissimus and pectoralis borders  UPPER EXTREMITY AROM/PROM: A/PROM RIGHT    02/22/22 03/22/22 04/05/22 01/07/23  Shoulder extension 45 45 50  20 - back of shoulder pain  Shoulder flexion 150 - tight in axilla  165 -  tight in axilla  167 - pull in axilla  167- pull in the axilla  152 -pull Rt axilla and into breast  Shoulder abduction 160- tight in axilla  165 - tight in pectoralis  167 - pull in axilla 167 - pull in lat not axilla  160- pull in mid upper arm and lat muscle   Shoulder internal rotation 70    65 - pn top of shoulder   Shoulder external rotation 90    88    (Blank rows = not tested)  A/PROM LEFT   01/07/23  Shoulder extension 30 30  Shoulder flexion 175 162  Shoulder abduction 164 165  Shoulder internal rotation  50  Shoulder external rotation 105 85    (Blank rows = not tested)  Quick DASH: 45%  UQS  Dermatome Myotome Reflex  C4 equal    C5 Decreased on Rt 4-/5 bil   C6  equal Elbow flexion: 4+/5 bil Wrist extension: 4-/5 Lt, 4/5 Rt   C7 equal 4/5 bil   C8 Decreased Rt 4-/5 Lt, 4/5 Rt   T1 Decreased Rt 4-/5 Lt, 4/5 Rt    Grip strength: Rt: 30pounds,  Lt: 35pounds 02/11/23. 30sec STS: 13 times without hands 10/10 SPPB score SL stance and mCTSIB all WNL without  sway or limitation Grip strength: Rt: 65# and Lt 70#  TODAY'S TREATMENT  02/11/23 Baseline chemotherapy testing performed per above Discussion on "moving through cancer" handouts and information with focus on walking importance and good vs bad days as well as some light resistance or functional movements.  STM Rt: pectoralis, UT, latissimus, and axilla in supine and latissimus in sidelying - avoided a lot of QL due to biopsy  Pinning, PROM, stretched positions included with STM.  Lengthening opposing forces/opposite hands pressure at hip and scapula in sidelying Cording release Lt axilla   01/30/23 Large ball QL stretch for the Rt 3x30"  Childs post Rt stretch x 30" Cat/cow x 10  STM Rt: pectoralis, UT, latissimus, and axilla in supine and QL, paraspinals, latissimus in sideyling.  Pinning, PROM, stretched positions included with STM.  Lengthening opposing forces/opposite hands pressure at hip and scapula in  sidelying  01/28/23 Discussed new treatment plan and status STM Rt: pectoralis, UT, latissimus, and axilla in supine and QL, paraspinals, latissimus in sideyling.  Pinning, PROM, stretched positions included with STM.  Lengthening opposing forces/opposite hands pressure at hip and scapula in sidelying   PATIENT EDUCATION:  Education details: POC, updated HEP Person educated: Patient Education method: Programmer, multimedia, Demonstration, Verbal cues, and Handouts Education comprehension: verbalized understanding and returned demonstration  HOME EXERCISE PROGRAM: Access code did not copy Supine Shoulder Flexion Extension AAROM with Dowel - 1-2 x daily - 7 x weekly - 10 reps - 5 seconds hold  Tennis ball release of rhomboids and lat Doorway stretch bil Latissimus wall stretch   Access Code: [email or web address] URL: [email or web address] Date: 01/14/2023 Prepared by: Gwenevere Abbot  Exercises - Supine Lower Trunk Rotation  - 1 x daily - 7 x weekly - 1-3 sets - 10 reps - 20-30 seconds hold - Supine Hamstring Stretch with Strap  - 1 x daily - 7 x weekly - 1-3 sets - 10 reps - 20-30 seconds hold - Supine Figure 4 Piriformis Stretch with Leg Extension  - 1 x daily - 7 x weekly - 1-3 sets - 10 reps - 20-30 seconds hold - Supine Piriformis Stretch with Leg Straight  - 1 x daily - 7 x weekly - 3 reps - 30 seconds  hold - Child's Pose Side Bend  - 1 x daily - 7 x weekly - 1-3 sets - 10 reps - 20-30 seconds hold  ASSESSMENT: CLINICAL IMPRESSION:  Pt has new news of metastatic breast cancer and will be starting chemotherapy on Friday.  Performed a baseline chemotherapy session with education on what to expect in terms of PT and gait/balance/neuroapthy.  Discussed aerobic activity and strengthening throughout and how we can still do sessions if she would like to continue.    GOALS: Goals reviewed with patient? Yes  STGs = LONG TERM GOALS:  Pt will improve UE flexion AROM to at least 160 to improve  mobility Baseline: 150 Target date: 02/18/23 Goal status: INITIAL  2.  Pt will improve Lt UE strength to equal to the opposite side to allow for improved functional strength Baseline:  Target date: 02/18/23 Goal status: INITIAL  3. Pt will report pain at rest of 1/10 or less on the Rt lateral trunk  INITIAL  GOAL: 02/18/23 PLAN: PT FREQUENCY: 1-2x per week  PT DURATION: 6 more weeks  PLANNED INTERVENTIONS: Therapeutic exercises, Patient/Family education, and Manual therapy, Dry Needling, modalities as needed  PLAN FOR NEXT SESSION: PELVIC FLOOR REFERRAL as able (after scan results) Rt upper quadrant and lumbar ROM with STM release to   Lt: PROM and STM as needed, bil UE strengthening, Do SOZO  PHYSICAL THERAPY DISCHARGE SUMMARY  Visits from Start of Care: 7  Current functional level related to goals / functional outcomes: Per above   Remaining deficits: lymphedema risk   Education / Equipment: Self care HEP   Plan: Patient agrees to discharge.      Idamae Lusher, PT 02/11/2023, 1:05 PM

## 2023-02-12 ENCOUNTER — Inpatient Hospital Stay: Payer: BC Managed Care – PPO

## 2023-02-12 ENCOUNTER — Encounter: Payer: Self-pay | Admitting: Hematology and Oncology

## 2023-02-12 ENCOUNTER — Ambulatory Visit (HOSPITAL_BASED_OUTPATIENT_CLINIC_OR_DEPARTMENT_OTHER)
Admission: RE | Admit: 2023-02-12 | Discharge: 2023-02-12 | Disposition: A | Discharge status: Home / Self Care | Payer: BC Managed Care – PPO | Source: Ambulatory Visit | Attending: Hematology and Oncology | Admitting: Hematology and Oncology

## 2023-02-12 ENCOUNTER — Inpatient Hospital Stay: Payer: BC Managed Care – PPO | Admitting: Pharmacist

## 2023-02-12 ENCOUNTER — Other Ambulatory Visit: Payer: Self-pay

## 2023-02-12 ENCOUNTER — Inpatient Hospital Stay (HOSPITAL_BASED_OUTPATIENT_CLINIC_OR_DEPARTMENT_OTHER): Payer: BC Managed Care – PPO | Admitting: Hematology and Oncology

## 2023-02-12 VITALS — BP 145/92 | HR 65 | Temp 97.2°F | Resp 18 | Ht 65.0 in | Wt 165.6 lb

## 2023-02-12 DIAGNOSIS — C50911 Malignant neoplasm of unspecified site of right female breast: Secondary | ICD-10-CM | POA: Diagnosis not present

## 2023-02-12 DIAGNOSIS — I10 Essential (primary) hypertension: Secondary | ICD-10-CM | POA: Diagnosis not present

## 2023-02-12 DIAGNOSIS — C787 Secondary malignant neoplasm of liver and intrahepatic bile duct: Secondary | ICD-10-CM

## 2023-02-12 DIAGNOSIS — Z5112 Encounter for antineoplastic immunotherapy: Secondary | ICD-10-CM | POA: Diagnosis not present

## 2023-02-12 DIAGNOSIS — Z5181 Encounter for therapeutic drug level monitoring: Secondary | ICD-10-CM | POA: Insufficient documentation

## 2023-02-12 DIAGNOSIS — Z0189 Encounter for other specified special examinations: Secondary | ICD-10-CM | POA: Diagnosis not present

## 2023-02-12 DIAGNOSIS — C50919 Malignant neoplasm of unspecified site of unspecified female breast: Secondary | ICD-10-CM

## 2023-02-12 DIAGNOSIS — R519 Headache, unspecified: Secondary | ICD-10-CM | POA: Diagnosis not present

## 2023-02-12 DIAGNOSIS — Z86000 Personal history of in-situ neoplasm of breast: Secondary | ICD-10-CM | POA: Diagnosis not present

## 2023-02-12 DIAGNOSIS — Z170 Estrogen receptor positive status [ER+]: Secondary | ICD-10-CM | POA: Diagnosis not present

## 2023-02-12 DIAGNOSIS — Z79899 Other long term (current) drug therapy: Secondary | ICD-10-CM

## 2023-02-12 DIAGNOSIS — K769 Liver disease, unspecified: Secondary | ICD-10-CM | POA: Diagnosis not present

## 2023-02-12 DIAGNOSIS — Z923 Personal history of irradiation: Secondary | ICD-10-CM | POA: Diagnosis not present

## 2023-02-12 DIAGNOSIS — D1803 Hemangioma of intra-abdominal structures: Secondary | ICD-10-CM | POA: Diagnosis not present

## 2023-02-12 DIAGNOSIS — R0781 Pleurodynia: Secondary | ICD-10-CM | POA: Diagnosis not present

## 2023-02-12 DIAGNOSIS — Z9011 Acquired absence of right breast and nipple: Secondary | ICD-10-CM | POA: Diagnosis not present

## 2023-02-12 DIAGNOSIS — C9201 Acute myeloblastic leukemia, in remission: Secondary | ICD-10-CM | POA: Diagnosis not present

## 2023-02-12 LAB — ECHOCARDIOGRAM COMPLETE
Area-P 1/2: 1.55 cm2
Height: 65 in
S' Lateral: 2.8 cm
Weight: 2649.6 oz

## 2023-02-12 NOTE — Progress Notes (Signed)
Received verbal orders from MD for ECHO pre-chemo and port placement. Orders placed. Pt scheduled for first available with each. Pt educated and verbalized understanding.

## 2023-02-12 NOTE — Progress Notes (Signed)
[facility]       Telephone: [phone]?Fax: [phone]   Oncology Clinical Pharmacist Practitioner Initial Assessment  Joann Meadows is a 47 y.o. female with a diagnosis of breast cancer. They were contacted today via in-person visit. She is accompanied by her husband Joann Meadows.  Indication/Regimen THP: Trastuzumab (Herceptin) and pertuzumab (Perjeta) and docetaxel (Taxotere) are being used appropriately for treatment of breast cancer by Dr. Nicholas Lose.      Wt Readings from Last 1 Encounters:  02/06/23 166 lb 14.4 oz (75.7 kg)    Estimated body surface area is 1.86 meters squared as calculated from the following:   Height as of 02/06/23: 5\' 5"  (1.651 m).   Weight as of 02/06/23: 166 lb 14.4 oz (75.7 kg).  The dosing regimen is every 21 days for at least 6 cycles  Trastuzumab (8 mg/kg load, 6 mg/kg maintenance) on Day 1 Pertuzumab (840 mg load, 420 mg maintenance) on Day 1 Docetaxel (75 mg/m2) on Day 1 Pegfilgrastim (6 mg) on Day 3 -- removed by Dr. Lindi Adie  Followed by the below dosing regimen every 21 days until progression or unacceptable toxicity  Trastuzumab (6 mg/kg) on Day 1 Pertuzumab (420 mg) on Day 1  Dose Modifications Full dose for chemotherapy   Allergies Allergies  Allergen Reactions   Tamoxifen Citrate     Other reaction(s): depression, joint pain   Vitals = No vitals or labs were done today for this chemotherapy education visit   Contraindications Contraindications were reviewed? Yes Contraindications to therapy were identified? No   Safety Precautions (written information also provided) The following safety precautions for the use of THP were reviewed:  Decreased white blood cells (WBCs) and increased risk for infection Fever: reviewed the importance of having a thermometer and the Centers for Disease Control and Prevention (CDC) definition of fever which is 100.47F (38C) or higher. Patient should call 24/7 triage at [phone]  [phone] if experiencing a fever or any other symptoms Diarrhea Hair Loss Nausea or vomiting Fatigue Rash or itchy skin Peripheral neuropathy: numbness or tingling in hands and feet Mouth sores Nail Changes Fluid retention or swelling (edema) Muscle or joint pain or weakness Cardiotoxicity from trastuzumab Infusion reactions Pneumonitis from trastuzumab Docetaxel irritating veins Liver toxicity from docetaxel Docetaxel can cause eye pain, blurred vision, tearing, and light sensitivity Handling body fluids and waste Intimacy, sexual activity, contraception, fertility  Medication Reconciliation Current Outpatient Medications  Medication Sig Dispense Refill   ALPRAZolam (XANAX) 0.25 MG tablet Take 0.25 mg by mouth daily as needed.     hydrochlorothiazide (HYDRODIURIL) 25 MG tablet TAKE 1 TABLET BY MOUTH EVERY DAY IN THE MORNING FOR 30 DAYS ONCE A DAY     VITAMIN D PO Take 2,000 mg by mouth daily.     dexamethasone (DECADRON) 4 MG tablet Take 1 tablet (4 mg total) by mouth daily. Take 1 tab day before chemo and 1 tab day after chemo with food (Patient not taking: Reported on 02/12/2023) 30 tablet 1   ibuprofen (ADVIL) 200 MG tablet Take 2 tablets by mouth every 12 (twelve) hours as needed. (Patient not taking: Reported on 02/12/2023)     lidocaine-prilocaine (EMLA) cream Apply to affected area once (Patient not taking: Reported on 02/12/2023) 30 g 3   ondansetron (ZOFRAN) 8 MG tablet Take 1 tablet (8 mg total) by mouth every 8 (eight) hours as needed for nausea or vomiting. 30 tablet 1   prochlorperazine (COMPAZINE) 10 MG tablet Take 1 tablet (10  mg total) by mouth every 6 (six) hours as needed for nausea or vomiting. (Patient not taking: Reported on 02/12/2023) 30 tablet 1   No current facility-administered medications for this visit.   Medication reconciliation is based on the patient's most recent medication list in the electronic medical record (EMR) including herbal products and OTC  medications.   The patient's medication list was reviewed today with the patient? Yes   Drug-drug interactions (DDIs) DDIs were evaluated? Yes Significant DDIs identified? No   Drug-Food Interactions Drug-food interactions were evaluated? Yes Drug-food interactions identified? No   Follow-up Plan  Treatment start date: 02/14/23 -- peripherally per Dr. Lindi Adie. Port placement will be 02/25/23 Port placement date: 02/25/23 ECHO date: 02/12/23 @ 2:00 p.m. We reviewed prescriptions, premedications, and the treatment regimen with patient. Possible side effects of the treatment regimen were reviewed as well as management strategies.  Can use loperamide as needed for diarrhea and Senna-S as needed for constipation.  Clinical pharmacy will assist Dr. Nicholas Lose and Ria Clock on an as needed basis going forward  Joann Meadows participated in the discussion, expressed understanding, and voiced agreement with the above plan. All questions were answered to her satisfaction. The patient was advised to contact the clinic at [phone] with any questions or concerns prior to her return visit.   I spent 60 minutes assessing the patient.  Lynsi Dooner A. Camille Bal, PharmD, BCOP, CPP  Raina Mina, RPH-CPP, 02/12/2023 10:40 AM  **Disclaimer: This note was dictated with voice recognition software. Similar sounding words can inadvertently be transcribed and this note may contain transcription errors which may not have been corrected upon publication of note.**

## 2023-02-12 NOTE — Progress Notes (Signed)
Patient called after receiving my card per my request.  Introduced myself as Arboriculturist and to offer available resources. Discussed one-time $1000 Radio broadcast assistant to assist with personal expenses while going through treatment. Advised what would be needed to apply. She states she will think about it and call me back should she decide to apply for household of 4.   She has my card for any additional financial questions or concerns.

## 2023-02-12 NOTE — Assessment & Plan Note (Signed)
02/11/19: RT lumpectomy: DICS Intermediate grade, Margins Neg, ER 90%, PR 70%; Lt Lumpectomy: UDH status post radiation, could not tolerate tamoxifen recurrence: 08/15/2021:MRI surveillance detected Right breast calcifications 5 cm, Ant biopsy: IG DCIS with necrosis, ER 40%, PR 0%, Posterior biopsy: Intermediate grade DCIS with [facility]  11/29/2021: Right mastectomy: High-grade DCIS with microinvasion, ER 0%, PR 0%, HER2 positive, 0/1 lymph node negative  T1c MIC, N0: Stage Ia   Patient had second opinion at [place] with Dr. Harden Mo who agreed with her plan of no systemic chemotherapy   Breast cancer surveillance: 1.  Mammogram 08/22/2022: Left breast benign breast density category B  2.  CT CAP 12/09/2022: Multiple liver lesions indicative of cysts and hemangiomas new right hepatic lobe lesion favored to be hemangioma otherwise no evidence of metastatic disease.  Radiology recommended an MRI of the liver. 3.  Liver MRI 01/08/2023: 2 New rim-enhancing lesions 2.3 cm (was 1.6 cm on 12/09/2022), 1.8 cm (measured 1.5 cm), benign hemangioma 2.4 cm and 1.9 cm and 1.3 cm unchanged 4.  PET CT scan 01/22/2023: Liver: 2.5 cm and 2.4 cm lesions ------------------------------------------------------------------------------------------------------------------------------------------------- 01/31/2023: Liver lesion biopsy: Metastatic adenocarcinoma to the liver consistent with breast primary positive for CK7 and GATA3, ER 0%, PR 0%, Ki-67 25%, HER2 3+ positive, [facility] molecular testing is pending  Current treatment: Taxotere Herceptin Perjeta cycle 1 day 1 Labs reviewed, chemo education completed, chemo consent obtained, antiemetics were discussed Meningioma: Follows with Dr. Mickeal Skinner  Return to clinic in 1 week for toxicity check

## 2023-02-12 NOTE — Progress Notes (Signed)
Patient Care Team: Lujean Amel, MD as PCP - General (Family Medicine) Rockwell Germany, RN as Oncology Nurse Navigator [name], RN as Oncology Nurse Navigator Nicholas Lose, MD as Consulting Physician (Hematology and Oncology)  DIAGNOSIS:  Encounter Diagnosis  Name Primary?   Carcinoma of breast metastatic to liver, unspecified laterality ([place]) Yes    SUMMARY OF ONCOLOGIC HISTORY: Oncology History  Breast cancer metastasized to liver ([place])  01/25/2019 Initial Diagnosis   Screening mammogram showed bilateral calcifications right breast 5 mm, pleomorphic.  Left breast 1.6 cm punctate, smaller group of calcifications spanning 3 mm.  Right lower outer quadrant biopsy revealed low-grade DCIS ER 90%, PR 70%; biopsy of left breast calcifications ALH and PASH   01/31/2019 Genetic Testing   ATM c.8495G>A and RECQL4 c.2587G>A VUS identified on the 9-gene STAT panel and Multi-cancer panel through [facility].  The STAT Breast cancer panel offered by [facility] includes sequencing and rearrangement analysis for the following 9 genes:  ATM, BRCA1, BRCA2, CDH1, CHEK2, PALB2, PTEN, STK11 and TP53.   The Multi-Gene Panel offered by [facility] includes sequencing and/or deletion duplication testing of the following 84 genes: AIP, ALK, APC, ATM, AXIN2,BAP1,  BARD1, BLM, BMPR1A, BRCA1, BRCA2, BRIP1, CASR, CDC73, CDH1, CDK4, CDKN1B, CDKN1C, CDKN2A (p14ARF), CDKN2A (p16INK4a), CEBPA, CHEK2, CTNNA1, DICER1, DIS3L2, EGFR (c.2369C>T, p.Thr790Met variant only), EPCAM (Deletion/duplication testing only), FH, FLCN, GATA2, GPC3, GREM1 (Promoter region deletion/duplication testing only), HOXB13 (c.251G>A, p.Gly84Glu), HRAS, KIT, MAX, MEN1, MET, MITF (c.952G>A, p.Glu318Lys variant only), MLH1, MSH2, MSH3, MSH6, MUTYH, NBN, NF1, NF2, NTHL1, PALB2, PDGFRA, PHOX2B, PMS2, POLD1, POLE, POT1, PRKAR1A, PTCH1, PTEN, RAD50, RAD51C, RAD51D, RB1, RECQL4, RET, RUNX1, SDHAF2, SDHA (sequence changes only), SDHB, SDHC, SDHD, SMAD4, SMARCA4,  SMARCB1, SMARCE1, STK11, SUFU, TERC, TERT, TMEM127, TP53, TSC1, TSC2, VHL, WRN and WT1.  he report date is February 02, 2019.   02/11/2019 Surgery   RT lumpectomy: DICS Intermediate grade, Margins Neg, ER 90%, PR 70%; Lt Lumpectomy: UDH   02/26/2019 -  Anti-estrogen oral therapy   Tamoxifen 20mg  daily, stopped because of depression and facial numbness   05/20/2019 - 06/17/2019 Radiation Therapy   Adjuvant XRT   08/15/2021 Relapse/Recurrence   MRI surveillance detected Right breast calcifications 5 cm, Ant biopsy: IG DCIS with necrosis, ER 40%, PR 0%, Posterior biopsy: Intermediate grade DCIS with [facility]   08/19/2022 Cancer Staging   Staging form: Breast, AJCC 8th Edition - Clinical: Stage IV (cT85mi, cNX, cM1, G3, ER: Unknown, PR: Unknown, HER2: Unknown) - Signed by Nicholas Lose, MD on 02/06/2023 Histologic grading system: 3 grade system   02/13/2023 -  Chemotherapy   Patient is on Treatment Plan : BREAST DOCEtaxel + Trastuzumab + Pertuzumab (THP) q21d x 8 cycles / Trastuzumab + Pertuzumab q21d x 4 cycles     AML (acute myeloid leukemia) ([place])  09/1998 Initial Diagnosis   AML (acute myeloid leukemia) ([place]) treated with 2 induction regimens followed by 6 consolidation regimens, remission     CHIEF COMPLIANT: Tomorrow is cycle 1 Taxotere Herceptin and Perjeta  INTERVAL HISTORY: Joann Meadows is a 47 year old above-mentioned history of metastatic breast cancer who is here today to complete her chemo education regarding Taxotere Herceptin and Perjeta.  She is anxious and worried about adverse effects of chemotherapy.  She has an appointment tomorrow to receive treatment.  Port placement has been scheduled for day after chemo.   ALLERGIES:  is allergic to tamoxifen citrate.  MEDICATIONS:  Current Outpatient Medications  Medication Sig Dispense Refill   ALPRAZolam (XANAX) 0.25 MG  tablet Take 0.25 mg by mouth daily as needed.     dexamethasone (DECADRON) 4 MG tablet Take 1 tablet (4 mg total) by  mouth daily. Take 1 tab day before chemo and 1 tab day after chemo with food (Patient not taking: Reported on 02/12/2023) 30 tablet 1   hydrochlorothiazide (HYDRODIURIL) 25 MG tablet TAKE 1 TABLET BY MOUTH EVERY DAY IN THE MORNING FOR 30 DAYS ONCE A DAY     ibuprofen (ADVIL) 200 MG tablet Take 2 tablets by mouth every 12 (twelve) hours as needed. (Patient not taking: Reported on 02/12/2023)     lidocaine-prilocaine (EMLA) cream Apply to affected area once (Patient not taking: Reported on 02/12/2023) 30 g 3   ondansetron (ZOFRAN) 8 MG tablet Take 1 tablet (8 mg total) by mouth every 8 (eight) hours as needed for nausea or vomiting. 30 tablet 1   prochlorperazine (COMPAZINE) 10 MG tablet Take 1 tablet (10 mg total) by mouth every 6 (six) hours as needed for nausea or vomiting. (Patient not taking: Reported on 02/12/2023) 30 tablet 1   VITAMIN D PO Take 2,000 mg by mouth daily.     No current facility-administered medications for this visit.    PHYSICAL EXAMINATION: ECOG PERFORMANCE STATUS: 1 - Symptomatic but completely ambulatory  Vitals:   02/12/23 1111  BP: (!) 145/92  Pulse: 65  Resp: 18  Temp: (!) 97.2 F (36.2 C)  SpO2: 99%   Filed Weights   02/12/23 1111  Weight: 165 lb 9.6 oz (75.1 kg)      LABORATORY DATA:  I have reviewed the data as listed    Latest Ref Rng & Units 01/23/2023    9:04 AM 10/27/2019    3:28 PM 06/16/2019    2:38 PM  CMP  Glucose 70 - 99 mg/dL 99  96  117   BUN 6 - 20 mg/dL 14  19  14    Creatinine 0.44 - 1.00 mg/dL 0.77  0.84  0.82   Sodium 135 - 145 mmol/L 139  141  142   Potassium 3.5 - 5.1 mmol/L 3.6  3.9  4.4   Chloride 98 - 111 mmol/L 99  107  109   CO2 22 - 32 mmol/L 32  25  24   Calcium 8.9 - 10.3 mg/dL 9.3  9.1  9.1   Total Protein 6.5 - 8.1 g/dL 7.5  7.2  6.9   Total Bilirubin 0.3 - 1.2 mg/dL 0.5  0.3  0.4   Alkaline Phos 38 - 126 U/L 63  59  45   AST 15 - 41 U/L 14  13  11    ALT 0 - 44 U/L 14  15  12      Lab Results  Component Value Date    WBC 6.9 01/31/2023   HGB 14.0 01/31/2023   HCT 41.5 01/31/2023   MCV 88.1 01/31/2023   PLT 287 01/31/2023   NEUTROABS 3.9 01/31/2023    ASSESSMENT & PLAN:  Breast cancer metastasized to liver ([place]) 02/11/19: RT lumpectomy: DICS Intermediate grade, Margins Neg, ER 90%, PR 70%; Lt Lumpectomy: UDH status post radiation, could not tolerate tamoxifen recurrence: 08/15/2021:MRI surveillance detected Right breast calcifications 5 cm, Ant biopsy: IG DCIS with necrosis, ER 40%, PR 0%, Posterior biopsy: Intermediate grade DCIS with [facility]  11/29/2021: Right mastectomy: High-grade DCIS with microinvasion, ER 0%, PR 0%, HER2 positive, 0/1 lymph node negative  T1c MIC, N0: Stage Ia   Patient had second opinion at [place] with Dr. Harden Mo who  agreed with her plan of no systemic chemotherapy   Breast cancer surveillance: 1.  Mammogram 08/22/2022: Left breast benign breast density category B  2.  CT CAP 12/09/2022: Multiple liver lesions indicative of cysts and hemangiomas new right hepatic lobe lesion favored to be hemangioma otherwise no evidence of metastatic disease.  Radiology recommended an MRI of the liver. 3.  Liver MRI 01/08/2023: 2 New rim-enhancing lesions 2.3 cm (was 1.6 cm on 12/09/2022), 1.8 cm (measured 1.5 cm), benign hemangioma 2.4 cm and 1.9 cm and 1.3 cm unchanged 4.  PET CT scan 01/22/2023: Liver: 2.5 cm and 2.4 cm lesions ------------------------------------------------------------------------------------------------------------------------------------------------- 01/31/2023: Liver lesion biopsy: Metastatic adenocarcinoma to the liver consistent with breast primary positive for CK7 and GATA3, ER 0%, PR 0%, Ki-67 25%, HER2 3+ positive, [facility] molecular testing is pending  Current treatment: Taxotere Herceptin Perjeta cycle 1 day 1 Labs reviewed, chemo education completed, chemo consent obtained, antiemetics were discussed Meningioma: Follows with Dr. Mickeal Skinner  Return to clinic in 1 week for  toxicity check   No orders of the defined types were placed in this encounter.  The patient has a good understanding of the overall plan. she agrees with it. she will call with any problems that may develop before the next visit here. Total time spent: 30 mins including face to face time and time spent for planning, charting and co-ordination of care   Harriette Ohara, MD 02/12/23

## 2023-02-13 DIAGNOSIS — Z01419 Encounter for gynecological examination (general) (routine) without abnormal findings: Secondary | ICD-10-CM | POA: Diagnosis not present

## 2023-02-13 DIAGNOSIS — Z6829 Body mass index (BMI) 29.0-29.9, adult: Secondary | ICD-10-CM | POA: Diagnosis not present

## 2023-02-13 DIAGNOSIS — Z124 Encounter for screening for malignant neoplasm of cervix: Secondary | ICD-10-CM | POA: Diagnosis not present

## 2023-02-13 MED FILL — Dexamethasone Sodium Phosphate Inj 100 MG/10ML: INTRAMUSCULAR | Qty: 1 | Status: AC

## 2023-02-14 ENCOUNTER — Inpatient Hospital Stay: Payer: BC Managed Care – PPO

## 2023-02-14 ENCOUNTER — Encounter: Payer: BC Managed Care – PPO | Admitting: Rehabilitation

## 2023-02-14 ENCOUNTER — Other Ambulatory Visit: Payer: Self-pay

## 2023-02-14 VITALS — BP 123/81 | HR 85 | Temp 98.2°F | Resp 18 | Wt 165.2 lb

## 2023-02-14 DIAGNOSIS — Z923 Personal history of irradiation: Secondary | ICD-10-CM | POA: Diagnosis not present

## 2023-02-14 DIAGNOSIS — R519 Headache, unspecified: Secondary | ICD-10-CM | POA: Diagnosis not present

## 2023-02-14 DIAGNOSIS — R0781 Pleurodynia: Secondary | ICD-10-CM | POA: Diagnosis not present

## 2023-02-14 DIAGNOSIS — C50911 Malignant neoplasm of unspecified site of right female breast: Secondary | ICD-10-CM | POA: Diagnosis not present

## 2023-02-14 DIAGNOSIS — Z5112 Encounter for antineoplastic immunotherapy: Secondary | ICD-10-CM | POA: Diagnosis not present

## 2023-02-14 DIAGNOSIS — Z79899 Other long term (current) drug therapy: Secondary | ICD-10-CM | POA: Diagnosis not present

## 2023-02-14 DIAGNOSIS — C50919 Malignant neoplasm of unspecified site of unspecified female breast: Secondary | ICD-10-CM

## 2023-02-14 DIAGNOSIS — I10 Essential (primary) hypertension: Secondary | ICD-10-CM | POA: Diagnosis not present

## 2023-02-14 DIAGNOSIS — K769 Liver disease, unspecified: Secondary | ICD-10-CM | POA: Diagnosis not present

## 2023-02-14 DIAGNOSIS — Z170 Estrogen receptor positive status [ER+]: Secondary | ICD-10-CM | POA: Diagnosis not present

## 2023-02-14 DIAGNOSIS — Z5181 Encounter for therapeutic drug level monitoring: Secondary | ICD-10-CM | POA: Diagnosis not present

## 2023-02-14 DIAGNOSIS — Z9011 Acquired absence of right breast and nipple: Secondary | ICD-10-CM | POA: Diagnosis not present

## 2023-02-14 DIAGNOSIS — C787 Secondary malignant neoplasm of liver and intrahepatic bile duct: Secondary | ICD-10-CM | POA: Diagnosis not present

## 2023-02-14 DIAGNOSIS — D1803 Hemangioma of intra-abdominal structures: Secondary | ICD-10-CM | POA: Diagnosis not present

## 2023-02-14 DIAGNOSIS — Z86000 Personal history of in-situ neoplasm of breast: Secondary | ICD-10-CM | POA: Diagnosis not present

## 2023-02-14 DIAGNOSIS — C9201 Acute myeloblastic leukemia, in remission: Secondary | ICD-10-CM | POA: Diagnosis not present

## 2023-02-14 DIAGNOSIS — C50511 Malignant neoplasm of lower-outer quadrant of right female breast: Secondary | ICD-10-CM | POA: Diagnosis not present

## 2023-02-14 LAB — CBC WITH DIFFERENTIAL (CANCER CENTER ONLY)
Abs Immature Granulocytes: 0.03 10*3/uL (ref 0.00–0.07)
Basophils Absolute: 0.1 10*3/uL (ref 0.0–0.1)
Basophils Relative: 1 %
Eosinophils Absolute: 0.1 10*3/uL (ref 0.0–0.5)
Eosinophils Relative: 1 %
HCT: 41.6 % (ref 36.0–46.0)
Hemoglobin: 14 g/dL (ref 12.0–15.0)
Immature Granulocytes: 0 %
Lymphocytes Relative: 33 %
Lymphs Abs: 2.9 10*3/uL (ref 0.7–4.0)
MCH: 29.7 pg (ref 26.0–34.0)
MCHC: 33.7 g/dL (ref 30.0–36.0)
MCV: 88.1 fL (ref 80.0–100.0)
Monocytes Absolute: 0.7 10*3/uL (ref 0.1–1.0)
Monocytes Relative: 8 %
Neutro Abs: 5.1 10*3/uL (ref 1.7–7.7)
Neutrophils Relative %: 57 %
Platelet Count: 269 10*3/uL (ref 150–400)
RBC: 4.72 MIL/uL (ref 3.87–5.11)
RDW: 13.1 % (ref 11.5–15.5)
WBC Count: 9 10*3/uL (ref 4.0–10.5)
nRBC: 0 % (ref 0.0–0.2)

## 2023-02-14 LAB — CMP (CANCER CENTER ONLY)
ALT: 13 U/L (ref 0–44)
AST: 12 U/L — ABNORMAL LOW (ref 15–41)
Albumin: 4.6 g/dL (ref 3.5–5.0)
Alkaline Phosphatase: 50 U/L (ref 38–126)
Anion gap: 9 (ref 5–15)
BUN: 21 mg/dL — ABNORMAL HIGH (ref 6–20)
CO2: 29 mmol/L (ref 22–32)
Calcium: 9.9 mg/dL (ref 8.9–10.3)
Chloride: 104 mmol/L (ref 98–111)
Creatinine: 0.89 mg/dL (ref 0.44–1.00)
GFR, Estimated: >60 mL/min (ref >60)
Glucose, Bld: 101 mg/dL — ABNORMAL HIGH (ref 70–99)
Potassium: 2.8 mmol/L — ABNORMAL LOW (ref 3.5–5.1)
Sodium: 142 mmol/L (ref 135–145)
Total Bilirubin: 0.5 mg/dL (ref 0.3–1.2)
Total Protein: 7.4 g/dL (ref 6.5–8.1)

## 2023-02-14 MED ORDER — DIPHENHYDRAMINE HCL 25 MG PO CAPS
50.0000 mg | ORAL_CAPSULE | Freq: Once | ORAL | Status: AC
  Administered 2023-02-14: 50 mg via ORAL
  Filled 2023-02-14: qty 2

## 2023-02-14 MED ORDER — SODIUM CHLORIDE 0.9 % IV SOLN
75.0000 mg/m2 | Freq: Once | INTRAVENOUS | Status: AC
  Administered 2023-02-14: 140 mg via INTRAVENOUS
  Filled 2023-02-14: qty 14

## 2023-02-14 MED ORDER — SODIUM CHLORIDE 0.9 % IV SOLN: Freq: Once | INTRAVENOUS | Status: AC

## 2023-02-14 MED ORDER — SODIUM CHLORIDE 0.9 % IV SOLN
10.0000 mg | Freq: Once | INTRAVENOUS | Status: AC
  Administered 2023-02-14: 10 mg via INTRAVENOUS
  Filled 2023-02-14: qty 10

## 2023-02-14 MED ORDER — TRASTUZUMAB-ANNS CHEMO 150 MG IV SOLR
8.0000 mg/kg | Freq: Once | INTRAVENOUS | Status: AC
  Administered 2023-02-14: 600 mg via INTRAVENOUS
  Filled 2023-02-14: qty 28.57

## 2023-02-14 MED ORDER — ACETAMINOPHEN 325 MG PO TABS
650.0000 mg | ORAL_TABLET | Freq: Once | ORAL | Status: AC
  Administered 2023-02-14: 650 mg via ORAL
  Filled 2023-02-14: qty 2

## 2023-02-14 MED ORDER — SODIUM CHLORIDE 0.9 % IV SOLN
840.0000 mg | Freq: Once | INTRAVENOUS | Status: AC
  Administered 2023-02-14: 840 mg via INTRAVENOUS
  Filled 2023-02-14: qty 28

## 2023-02-14 NOTE — Patient Instructions (Signed)
[facility]  Discharge Instructions: Thank you for choosing [facility] to provide your oncology and hematology care.   If you have a lab appointment with the [facility], please go directly to the [facility] and check in at the registration area.   Wear comfortable clothing and clothing appropriate for easy access to any Portacath or PICC line.   We strive to give you quality time with your provider. You may need to reschedule your appointment if you arrive late (15 or more minutes).  Arriving late affects you and other patients whose appointments are after yours.  Also, if you miss three or more appointments without notifying the office, you may be dismissed from the clinic at the provider's discretion.      For prescription refill requests, have your pharmacy contact our office and allow 72 hours for refills to be completed.    Today you received the following chemotherapy and/or immunotherapy agents trastuzumab, pertuzumab, taxotere    To help prevent nausea and vomiting after your treatment, we encourage you to take your nausea medication as directed.  BELOW ARE SYMPTOMS THAT SHOULD BE REPORTED IMMEDIATELY: *FEVER GREATER THAN 100.4 F (38 C) OR HIGHER *CHILLS OR SWEATING *NAUSEA AND VOMITING THAT IS NOT CONTROLLED WITH YOUR NAUSEA MEDICATION *UNUSUAL SHORTNESS OF BREATH *UNUSUAL BRUISING OR BLEEDING *URINARY PROBLEMS (pain or burning when urinating, or frequent urination) *BOWEL PROBLEMS (unusual diarrhea, constipation, pain near the anus) TENDERNESS IN MOUTH AND THROAT WITH OR WITHOUT PRESENCE OF ULCERS (sore throat, sores in mouth, or a toothache) UNUSUAL RASH, SWELLING OR PAIN  UNUSUAL VAGINAL DISCHARGE OR ITCHING   Items with * indicate a potential emergency and should be followed up as soon as possible or go to the Emergency Department if any problems should occur.  Please show the CHEMOTHERAPY ALERT CARD or  IMMUNOTHERAPY ALERT CARD at check-in to the Emergency Department and triage nurse.  Should you have questions after your visit or need to cancel or reschedule your appointment, please contact [facility]  Dept: [phone]  and follow the prompts.  Office hours are 8:00 a.m. to 4:30 p.m. Monday - Friday. Please note that voicemails left after 4:00 p.m. may not be returned until the following business day.  We are closed weekends and major holidays. You have access to a nurse at all times for urgent questions. Please call the main number to the clinic Dept: [phone] and follow the prompts.   For any non-urgent questions, you may also contact your provider using MyChart. We now offer e-Visits for anyone 79 and older to request care online for non-urgent symptoms. For details visit [email or web address].   Also download the MyChart app! Go to the app store, search "MyChart", open the app, select [facility], and log in with your MyChart username and password.    Trastuzumab Injection What is this medication? TRASTUZUMAB (tras TOO zoo mab) treats breast cancer and stomach cancer. It works by blocking a protein that causes cancer cells to grow and multiply. This helps to slow or stop the spread of cancer cells. This medicine may be used for other purposes; ask your health care provider or pharmacist if you have questions. COMMON BRAND NAME(S): Herceptin, Janae Bridgeman, Ontruzant, Trazimera What should I tell my care team before I take this medication? They need to know if you have any of these conditions: Heart failure Lung disease An unusual or allergic reaction to trastuzumab,  other medications, foods, dyes, or preservatives Pregnant or trying to get pregnant Breast-feeding How should I use this medication? This medication is injected into a vein. It is given by your care team in a hospital or clinic setting. Talk to your care team about  the use of this medication in children. It is not approved for use in children. Overdosage: If you think you have taken too much of this medicine contact a poison control center or emergency room at once. NOTE: This medicine is only for you. Do not share this medicine with others. What if I miss a dose? Keep appointments for follow-up doses. It is important not to miss your dose. Call your care team if you are unable to keep an appointment. What may interact with this medication? Certain types of chemotherapy, such as daunorubicin, doxorubicin, epirubicin, idarubicin This list may not describe all possible interactions. Give your health care provider a list of all the medicines, herbs, non-prescription drugs, or dietary supplements you use. Also tell them if you smoke, drink alcohol, or use illegal drugs. Some items may interact with your medicine. What should I watch for while using this medication? Your condition will be monitored carefully while you are receiving this medication. This medication may make you feel generally unwell. This is not uncommon, as chemotherapy affects healthy cells as well as cancer cells. Report any side effects. Continue your course of treatment even though you feel ill unless your care team tells you to stop. This medication may increase your risk of getting an infection. Call your care team for advice if you get a fever, chills, sore throat, or other symptoms of a cold or flu. Do not treat yourself. Try to avoid being around people who are sick. Avoid taking medications that contain aspirin, acetaminophen, ibuprofen, naproxen, or ketoprofen unless instructed by your care team. These medications can hide a fever. Talk to your care team if you may be pregnant. Serious birth defects can occur if you take this medication during pregnancy and for 7 months after the last dose. You will need a negative pregnancy test before starting this medication. Contraception is recommended  while taking this medication and for 7 months after the last dose. Your care team can help you find the option that works for you. Do not breastfeed while taking this medication and for 7 months after stopping treatment. What side effects may I notice from receiving this medication? Side effects that you should report to your care team as soon as possible: Allergic reactions or angioedema--skin rash, itching or hives, swelling of the face, eyes, lips, tongue, arms, or legs, trouble swallowing or breathing Dry cough, shortness of breath or trouble breathing Heart failure--shortness of breath, swelling of the ankles, feet, or hands, sudden weight gain, unusual weakness or fatigue Infection--fever, chills, cough, or sore throat Infusion reactions--chest pain, shortness of breath or trouble breathing, feeling faint or lightheaded Side effects that usually do not require medical attention (report to your care team if they continue or are bothersome): Diarrhea Dizziness Headache Nausea Trouble sleeping Vomiting This list may not describe all possible side effects. Call your doctor for medical advice about side effects. You may report side effects to FDA at 1-800-FDA-1088. Where should I keep my medication? This medication is given in a hospital or clinic. It will not be stored at home. NOTE: This sheet is a summary. It may not cover all possible information. If you have questions about this medicine, talk to your doctor, pharmacist, or  health care provider.  2023 Elsevier/Gold Standard (2022-03-14 00:00:00)   Pertuzumab Injection What is this medication? PERTUZUMAB (per TOOZ ue mab) treats breast cancer. It works by blocking a protein that causes cancer cells to grow and multiply. This helps to slow or stop the spread of cancer cells. It is a monoclonal antibody. This medicine may be used for other purposes; ask your health care provider or pharmacist if you have questions. COMMON BRAND  NAME(S): PERJETA What should I tell my care team before I take this medication? They need to know if you have any of these conditions: Heart failure An unusual or allergic reaction to pertuzumab, other medications, foods, dyes, or preservatives Pregnant or trying to get pregnant Breast-feeding How should I use this medication? This medication is injected into a vein. It is given by your care team in a hospital or clinic setting. Talk to your care team about the use of this medication in children. Special care may be needed. Overdosage: If you think you have taken too much of this medicine contact a poison control center or emergency room at once. NOTE: This medicine is only for you. Do not share this medicine with others. What if I miss a dose? Keep appointments for follow-up doses. It is important not to miss your dose. Call your care team if you are unable to keep an appointment. What may interact with this medication? Interactions are not expected. This list may not describe all possible interactions. Give your health care provider a list of all the medicines, herbs, non-prescription drugs, or dietary supplements you use. Also tell them if you smoke, drink alcohol, or use illegal drugs. Some items may interact with your medicine. What should I watch for while using this medication? Your condition will be monitored carefully while you are receiving this medication. This medication may make you feel generally unwell. This is not uncommon as chemotherapy can affect healthy cells as well as cancer cells. Report any side effects. Continue your course of treatment even though you feel ill unless your care team tells you to stop. Talk to your care team if you may be pregnant. Serious birth defects can occur if you take this medication during pregnancy and for 7 months after the last dose. You will need a negative pregnancy test before starting this medication. Contraception is recommended while  taking this medication and for 7 months after the last dose. Your care team can help you find the option that works for you. Do not breastfeed while taking this medication and for 7 months after the last dose. What side effects may I notice from receiving this medication? Side effects that you should report to your care team as soon as possible: Allergic reactions or angioedema--skin rash, itching or hives, swelling of the face, eyes, lips, tongue, arms, or legs, trouble swallowing or breathing Heart failure--shortness of breath, swelling of the ankles, feet, or hands, sudden weight gain, unusual weakness or fatigue Infusion reactions--chest pain, shortness of breath or trouble breathing, feeling faint or lightheaded Side effects that usually do not require medical attention (report to your care team if they continue or are bothersome): Diarrhea Dry skin Fatigue Hair loss Nausea Vomiting This list may not describe all possible side effects. Call your doctor for medical advice about side effects. You may report side effects to FDA at 1-800-FDA-1088. Where should I keep my medication? This medication is given in a hospital or clinic. It will not be stored at home. NOTE: This sheet is  a summary. It may not cover all possible information. If you have questions about this medicine, talk to your doctor, pharmacist, or health care provider.  2023 Elsevier/Gold Standard (2011-05-07 00:00:00)   Docetaxel Injection What is this medication? DOCETAXEL (doe se TAX el) treats some types of cancer. It works by slowing down the growth of cancer cells. This medicine may be used for other purposes; ask your health care provider or pharmacist if you have questions. COMMON BRAND NAME(S): Docefrez, Taxotere What should I tell my care team before I take this medication? They need to know if you have any of these conditions: Kidney disease Liver disease Low white blood cell levels Tingling of the fingers or  toes or other nerve disorder An unusual or allergic reaction to docetaxel, polysorbate 80, other medications, foods, dyes, or preservatives Pregnant or trying to get pregnant Breast-feeding How should I use this medication? This medication is injected into a vein. It is given by your care team in a hospital or clinic setting. Talk to your care team about the use of this medication in children. Special care may be needed. Overdosage: If you think you have taken too much of this medicine contact a poison control center or emergency room at once. NOTE: This medicine is only for you. Do not share this medicine with others. What if I miss a dose? Keep appointments for follow-up doses. It is important not to miss your dose. Call your care team if you are unable to keep an appointment. What may interact with this medication? Do not take this medication with any of the following: Live virus vaccines This medication may also interact with the following: Certain antibiotics, such as clarithromycin, telithromycin Certain antivirals for HIV or hepatitis Certain medications for fungal infections, such as itraconazole, ketoconazole, voriconazole Grapefruit juice Nefazodone Supplements, such as St. John's wort This list may not describe all possible interactions. Give your health care provider a list of all the medicines, herbs, non-prescription drugs, or dietary supplements you use. Also tell them if you smoke, drink alcohol, or use illegal drugs. Some items may interact with your medicine. What should I watch for while using this medication? This medication may make you feel generally unwell. This is not uncommon as chemotherapy can affect healthy cells as well as cancer cells. Report any side effects. Continue your course of treatment even though you feel ill unless your care team tells you to stop. You may need blood work done while you are taking this medication. This medication can cause serious side  effects and infusion reactions. To reduce the risk, your care team may give you other medications to take before receiving this one. Be sure to follow the directions from your care team. This medication may increase your risk of getting an infection. Call your care team for advice if you get a fever, chills, sore throat, or other symptoms of a cold or flu. Do not treat yourself. Try to avoid being around people who are sick. Avoid taking medications that contain aspirin, acetaminophen, ibuprofen, naproxen, or ketoprofen unless instructed by your care team. These medications may hide a fever. Be careful brushing or flossing your teeth or using a toothpick because you may get an infection or bleed more easily. If you have any dental work done, tell your dentist you are receiving this medication. Some products may contain alcohol. Ask your care team if this medication contains alcohol. Be sure to tell all care teams you are taking this medicine. Certain medications,  like metronidazole and disulfiram, can cause an unpleasant reaction when taken with alcohol. The reaction includes flushing, headache, nausea, vomiting, sweating, and increased thirst. The reaction can last from 30 minutes to several hours. This medication may affect your coordination, reaction time, or judgement. Do not drive or operate machinery until you know how this medication affects you. Sit up or stand slowly to reduce the risk of dizzy or fainting spells. Drinking alcohol with this medication can increase the risk of these side effects. Talk to your care team about your risk of cancer. You may be more at risk for certain types of cancer if you take this medication. Talk to your care team if you wish to become pregnant or think you might be pregnant. This medication can cause serious birth defects if taken during pregnancy or if you get pregnant within 2 months after stopping therapy. A negative pregnancy test is required before starting this  medication. A reliable form of contraception is recommended while taking this medication and for 2 months after stopping it. Talk to your care team about reliable forms of contraception. Do not breast-feed while taking this medication and for 1 week after stopping therapy. Use a condom during sex and for 4 months after stopping therapy. Tell your care team right away if you think your partner might be pregnant. This medication can cause serious birth defects. This medication may cause infertility. Talk to your care team if you are concerned about your fertility. What side effects may I notice from receiving this medication? Side effects that you should report to your care team as soon as possible: Allergic reactions--skin rash, itching, hives, swelling of the face, lips, tongue, or throat Change in vision such as blurry vision, seeing halos around lights, vision loss Infection--fever, chills, cough, or sore throat Infusion reactions--chest pain, shortness of breath or trouble breathing, feeling faint or lightheaded Low red blood cell level--unusual weakness or fatigue, dizziness, headache, trouble breathing Pain, tingling, or numbness in the hands or feet Painful swelling, warmth, or redness of the skin, blisters or sores at the infusion site Redness, blistering, peeling, or loosening of the skin, including inside the mouth Sudden or severe stomach pain, bloody diarrhea, fever, nausea, vomiting Swelling of the ankles, hands, or feet Tumor lysis syndrome (TLS)--nausea, vomiting, diarrhea, decrease in the amount of urine, dark urine, unusual weakness or fatigue, confusion, muscle pain or cramps, fast or irregular heartbeat, joint pain Unusual bruising or bleeding Side effects that usually do not require medical attention (report to your care team if they continue or are bothersome): Change in nail shape, thickness, or color Change in taste Hair loss Increased tears This list may not describe all  possible side effects. Call your doctor for medical advice about side effects. You may report side effects to FDA at 1-800-FDA-1088. Where should I keep my medication? This medication is given in a hospital or clinic. It will not be stored at home. NOTE: This sheet is a summary. It may not cover all possible information. If you have questions about this medicine, talk to your doctor, pharmacist, or health care provider.  2023 Elsevier/Gold Standard (2008-01-02 00:00:00)

## 2023-02-17 ENCOUNTER — Encounter: Payer: Self-pay | Admitting: Hematology and Oncology

## 2023-02-17 ENCOUNTER — Emergency Department (HOSPITAL_COMMUNITY)
Admission: EM | Admit: 2023-02-17 | Discharge: 2023-02-17 | Disposition: A | Discharge status: Home / Self Care | Payer: BC Managed Care – PPO | Attending: Emergency Medicine | Admitting: Emergency Medicine

## 2023-02-17 ENCOUNTER — Other Ambulatory Visit: Payer: Self-pay

## 2023-02-17 ENCOUNTER — Other Ambulatory Visit: Payer: Self-pay | Admitting: *Deleted

## 2023-02-17 ENCOUNTER — Encounter (HOSPITAL_COMMUNITY): Payer: Self-pay | Admitting: Emergency Medicine

## 2023-02-17 ENCOUNTER — Telehealth: Payer: Self-pay | Admitting: Dietician

## 2023-02-17 ENCOUNTER — Telehealth: Payer: Self-pay | Admitting: *Deleted

## 2023-02-17 DIAGNOSIS — C787 Secondary malignant neoplasm of liver and intrahepatic bile duct: Secondary | ICD-10-CM | POA: Diagnosis not present

## 2023-02-17 DIAGNOSIS — E876 Hypokalemia: Secondary | ICD-10-CM

## 2023-02-17 DIAGNOSIS — Z87891 Personal history of nicotine dependence: Secondary | ICD-10-CM | POA: Insufficient documentation

## 2023-02-17 DIAGNOSIS — C50919 Malignant neoplasm of unspecified site of unspecified female breast: Secondary | ICD-10-CM

## 2023-02-17 DIAGNOSIS — C50911 Malignant neoplasm of unspecified site of right female breast: Secondary | ICD-10-CM | POA: Diagnosis not present

## 2023-02-17 DIAGNOSIS — Z856 Personal history of leukemia: Secondary | ICD-10-CM | POA: Insufficient documentation

## 2023-02-17 DIAGNOSIS — N390 Urinary tract infection, site not specified: Secondary | ICD-10-CM | POA: Diagnosis not present

## 2023-02-17 DIAGNOSIS — M545 Low back pain, unspecified: Secondary | ICD-10-CM | POA: Diagnosis not present

## 2023-02-17 LAB — CBC WITH DIFFERENTIAL/PLATELET
Abs Immature Granulocytes: 0.01 10*3/uL (ref 0.00–0.07)
Basophils Absolute: 0 10*3/uL (ref 0.0–0.1)
Basophils Relative: 0 %
Eosinophils Absolute: 0.1 10*3/uL (ref 0.0–0.5)
Eosinophils Relative: 3 %
HCT: 37.8 % (ref 36.0–46.0)
Hemoglobin: 12.5 g/dL (ref 12.0–15.0)
Immature Granulocytes: 0 %
Lymphocytes Relative: 35 %
Lymphs Abs: 1.7 10*3/uL (ref 0.7–4.0)
MCH: 29.3 pg (ref 26.0–34.0)
MCHC: 33.1 g/dL (ref 30.0–36.0)
MCV: 88.5 fL (ref 80.0–100.0)
Monocytes Absolute: 0.1 10*3/uL (ref 0.1–1.0)
Monocytes Relative: 2 %
Neutro Abs: 2.9 10*3/uL (ref 1.7–7.7)
Neutrophils Relative %: 60 %
Platelets: 208 10*3/uL (ref 150–400)
RBC: 4.27 MIL/uL (ref 3.87–5.11)
RDW: 13 % (ref 11.5–15.5)
WBC: 4.9 10*3/uL (ref 4.0–10.5)
nRBC: 0 % (ref 0.0–0.2)

## 2023-02-17 LAB — MAGNESIUM: Magnesium: 1.9 mg/dL (ref 1.7–2.4)

## 2023-02-17 LAB — COMPREHENSIVE METABOLIC PANEL
ALT: 15 U/L (ref 0–44)
AST: 16 U/L (ref 15–41)
Albumin: 3.6 g/dL (ref 3.5–5.0)
Alkaline Phosphatase: 47 U/L (ref 38–126)
Anion gap: 9 (ref 5–15)
BUN: 20 mg/dL (ref 6–20)
CO2: 27 mmol/L (ref 22–32)
Calcium: 8.6 mg/dL — ABNORMAL LOW (ref 8.9–10.3)
Chloride: 104 mmol/L (ref 98–111)
Creatinine, Ser: 0.78 mg/dL (ref 0.44–1.00)
GFR, Estimated: >60 mL/min (ref >60)
Glucose, Bld: 108 mg/dL — ABNORMAL HIGH (ref 70–99)
Potassium: 2.4 mmol/L — CL (ref 3.5–5.1)
Sodium: 140 mmol/L (ref 135–145)
Total Bilirubin: 0.5 mg/dL (ref 0.3–1.2)
Total Protein: 6.4 g/dL — ABNORMAL LOW (ref 6.5–8.1)

## 2023-02-17 LAB — URINALYSIS, ROUTINE W REFLEX MICROSCOPIC
Bilirubin Urine: NEGATIVE
Glucose, UA: NEGATIVE mg/dL
Ketones, ur: NEGATIVE mg/dL
Nitrite: NEGATIVE
Protein, ur: NEGATIVE mg/dL
Specific Gravity, Urine: 1.015 (ref 1.005–1.030)
pH: 6 (ref 5.0–8.0)

## 2023-02-17 LAB — CK: Total CK: 65 U/L (ref 38–234)

## 2023-02-17 LAB — BASIC METABOLIC PANEL
Anion gap: 11 (ref 5–15)
BUN: 21 mg/dL — ABNORMAL HIGH (ref 6–20)
CO2: 28 mmol/L (ref 22–32)
Calcium: 8.5 mg/dL — ABNORMAL LOW (ref 8.9–10.3)
Chloride: 99 mmol/L (ref 98–111)
Creatinine, Ser: 0.81 mg/dL (ref 0.44–1.00)
GFR, Estimated: >60 mL/min (ref >60)
Glucose, Bld: 108 mg/dL — ABNORMAL HIGH (ref 70–99)
Potassium: 2.8 mmol/L — ABNORMAL LOW (ref 3.5–5.1)
Sodium: 138 mmol/L (ref 135–145)

## 2023-02-17 MED ORDER — SODIUM CHLORIDE 0.9 % IV SOLN
1.0000 g | Freq: Once | INTRAVENOUS | Status: AC
  Administered 2023-02-17: 1 g via INTRAVENOUS
  Filled 2023-02-17: qty 10

## 2023-02-17 MED ORDER — POTASSIUM CHLORIDE CRYS ER 20 MEQ PO TBCR
40.0000 meq | EXTENDED_RELEASE_TABLET | Freq: Once | ORAL | Status: AC
  Administered 2023-02-17: 40 meq via ORAL
  Filled 2023-02-17: qty 2

## 2023-02-17 MED ORDER — POTASSIUM CHLORIDE CRYS ER 20 MEQ PO TBCR: 40.0000 meq | EXTENDED_RELEASE_TABLET | Freq: Two times a day (BID) | ORAL | 0 refills | Status: DC

## 2023-02-17 MED ORDER — POTASSIUM CHLORIDE 10 MEQ/100ML IV SOLN: 10.0000 meq | INTRAVENOUS | Status: DC

## 2023-02-17 MED ORDER — MAGIC MOUTHWASH W/LIDOCAINE: 5.0000 mL | Freq: Four times a day (QID) | ORAL | 2 refills | Status: DC | PRN

## 2023-02-17 MED ORDER — FENTANYL CITRATE PF 50 MCG/ML IJ SOSY
100.0000 ug | PREFILLED_SYRINGE | Freq: Once | INTRAMUSCULAR | Status: AC
  Administered 2023-02-17: 100 ug via INTRAVENOUS
  Filled 2023-02-17: qty 2

## 2023-02-17 MED ORDER — POTASSIUM CHLORIDE CRYS ER 20 MEQ PO TBCR
40.0000 meq | EXTENDED_RELEASE_TABLET | ORAL | Status: AC
  Administered 2023-02-17 (×3): 40 meq via ORAL
  Filled 2023-02-17 (×3): qty 2

## 2023-02-17 MED ORDER — CEPHALEXIN 500 MG PO CAPS: 500.0000 mg | ORAL_CAPSULE | Freq: Two times a day (BID) | ORAL | 0 refills | Status: AC

## 2023-02-17 MED ORDER — MORPHINE SULFATE (PF) 2 MG/ML IV SOLN: 2.0000 mg | INTRAVENOUS | Status: DC | PRN

## 2023-02-17 MED ORDER — ONDANSETRON HCL 4 MG/2ML IJ SOLN
4.0000 mg | Freq: Once | INTRAMUSCULAR | Status: AC
  Administered 2023-02-17: 4 mg via INTRAVENOUS
  Filled 2023-02-17: qty 2

## 2023-02-17 MED ORDER — POTASSIUM CHLORIDE 2 MEQ/ML IV SOLN
INTRAVENOUS | Status: DC
  Filled 2023-02-17 (×2): qty 1000

## 2023-02-17 NOTE — Telephone Encounter (Signed)
-----   Message from Tildon Husky, RN sent at 02/14/2023  2:59 PM EDT ----- Regarding: first time treatment call back -gudena- herceptin, perjeta, taxotere Patient received treatment today for the first time. She is followed by Dr. Lindi Adie she received THP treatment today with no issues.

## 2023-02-17 NOTE — ED Triage Notes (Signed)
Patient arrived with complaints of lower back and bilateral leg pain that started today. Started chemo on Friday, on call nurse referred to ED.

## 2023-02-17 NOTE — Progress Notes (Signed)
Received call from pt with complaint of mouth sores with starting tx.  Verbal orders received from provider for pt to be prescribed magic mouthwash.  Prescription called in to pharmacy on file.  Pt educated and verbalized understanding.

## 2023-02-17 NOTE — Telephone Encounter (Signed)
Per 3/25 IB reached out to schedule patient.

## 2023-02-17 NOTE — Telephone Encounter (Signed)
Pt seen in ED recently & Desk RN spoke to pt this am.  See her notes.  Did not call pt.

## 2023-02-17 NOTE — Telephone Encounter (Signed)
Received call from pt stating she was seen in the ED yesterday and treated for hypokalemia and UTI.  Pt states she was given 2 days of p.o potassium and was told to f/u with our office. MD out of office this week, [facility] visit scheduled for hospital f/u with lab work.  Pt notified of appt details and verbalized understanding.

## 2023-02-17 NOTE — ED Provider Notes (Signed)
[facility] DEPT Provider Note: Georgena Spurling, MD, FACEP  CSN: [id number] MRN: [id number] ARRIVAL: 02/17/23 at [place]: [place]  Back Pain   HISTORY OF PRESENT ILLNESS  02/17/23 12:22 AM Joann Meadows is a 47 y.o. female currently undergoing treatment for breast cancer metastatic to the liver.  Had her first chemotherapy Friday (it is now Monday morning).  She is here with an achy pain throughout her lower back and lower extremities.  There is no focal pain it is distributed throughout the body below her waist essentially.  She rates her pain as a 7 out of 10.  It is somewhat better with movement and worse with rest.  She has had no fever.  She has had no associated nausea or vomiting.  She did take cortisone the day before and the day after her chemotherapy.  The specific chemotherapy treatment she received was trastuzumab, pertuzumab and Taxotere, which all have complex side effects profiles..   Past Medical History:  Diagnosis Date   Breast cancer ([place]) 2020   Right Breast Cancer   Endometriosis    Fibroid    Hx of radiation therapy 06/2019   Leukemia ([facility])    20 years ago   Personal history of radiation therapy 2020   Right Breast Cancer   PONV (postoperative nausea and vomiting)    pt states she vomits after colonoscopies    Past Surgical History:  Procedure Laterality Date   BREAST EXCISIONAL BIOPSY Left 02/11/2019   [facility]   BREAST LUMPECTOMY Right 02/11/2019   BREAST LUMPECTOMY WITH RADIOACTIVE SEED LOCALIZATION Bilateral 02/11/2019   Procedure: BILATERAL BREAST LUMPECTOMIES WITH BILATERAL RADIOACTIVE SEED LOCALIZATION;  Surgeon: Donnie Mesa, MD;  Location: [facility];  Service: General;  Laterality: Bilateral;   MASTECTOMY Right 11/2021    Family History  Problem Relation Age of Onset   Colon cancer Mother 49       d. 36   Hypertension Father    Diabetes Maternal Aunt    Diabetes Maternal Uncle    Mental retardation  Maternal Uncle    Diabetes Paternal Aunt    Diabetes Maternal Grandmother    Stroke Paternal Grandmother        c. 74   Leukemia Paternal Grandfather        d. 13; ?CLL   Leukemia Cousin 4       mat first cousin   Lymphoma Cousin 60       pat first cousin    Social History   Tobacco Use   Smoking status: Former    Years: 10    Types: Cigarettes    Quit date: 01/25/2015    Years since quitting: 8.0   Smokeless tobacco: Never  Vaping Use   Vaping Use: Never used  Substance Use Topics   Alcohol use: No   Drug use: No    Prior to Admission medications   Medication Sig Start Date End Date Taking? Authorizing Provider  ALPRAZolam (XANAX) 0.25 MG tablet Take 0.25 mg by mouth daily as needed. 01/14/23   [provider]  dexamethasone (DECADRON) 4 MG tablet Take 1 tablet (4 mg total) by mouth daily. Take 1 tab day before chemo and 1 tab day after chemo with food Patient not taking: Reported on 02/12/2023 02/10/23   Nicholas Lose, MD  hydrochlorothiazide (HYDRODIURIL) 25 MG tablet TAKE 1 TABLET BY MOUTH EVERY DAY IN THE MORNING FOR 30 DAYS ONCE A DAY 11/04/22   [provider]  ibuprofen (ADVIL) 200 MG tablet Take 2 tablets by mouth every 12 (twelve) hours as needed. Patient not taking: Reported on 02/12/2023    [provider]  lidocaine-prilocaine (EMLA) cream Apply to affected area once Patient not taking: Reported on 02/12/2023 02/10/23   Nicholas Lose, MD  ondansetron (ZOFRAN) 8 MG tablet Take 1 tablet (8 mg total) by mouth every 8 (eight) hours as needed for nausea or vomiting. 02/10/23   Nicholas Lose, MD  prochlorperazine (COMPAZINE) 10 MG tablet Take 1 tablet (10 mg total) by mouth every 6 (six) hours as needed for nausea or vomiting. Patient not taking: Reported on 02/12/2023 02/10/23   Nicholas Lose, MD  VITAMIN D PO Take 2,000 mg by mouth daily.    [provider]    Allergies Tamoxifen citrate   REVIEW OF SYSTEMS  Negative except as noted  here or in the History of Present Illness.   PHYSICAL EXAMINATION  Initial Vital Signs Blood pressure (!) 170/95, pulse 84, temperature 98.4 F (36.9 C), temperature source Oral, resp. rate 18, height 5\' 5"  (1.651 m), weight 74.8 kg, last menstrual period 02/10/2023, SpO2 98 %.  Examination General: Well-developed, well-nourished female in no acute distress; appearance consistent with age of record HENT: normocephalic; atraumatic Eyes: Normal appearance Neck: supple Heart: regular rate and rhythm Lungs: clear to auscultation bilaterally Abdomen: soft; nondistended; nontender; bowel sounds present Extremities: No deformity; full range of motion; pulses normal; muscles nontender; no pain on passive range of motion Neurologic: Awake, alert and oriented; motor function intact in all extremities and symmetric; no facial droop Skin: Warm and dry Psychiatric: Normal mood and affect   RESULTS  Summary of this visit's results, reviewed and interpreted by myself:   EKG Interpretation  Date/Time:  Monday February 17 2023 02:45:23 EDT Ventricular Rate:  72 PR Interval:  156 QRS Duration: 88 QT Interval:  419 QTC Calculation: 459 R Axis:   66 Text Interpretation: Sinus rhythm RSR' in V1 or V2, probably normal variant No significant change was found Confirmed by Shanon Rosser [phone]) on 02/17/2023 2:51:03 AM       Laboratory Studies: Results for orders placed or performed during the hospital encounter of 02/17/23 (from the past 24 hour(s))  CK     Status: None   Collection Time: 02/17/23  1:30 AM  Result Value Ref Range   Total CK 65 38 - 234 U/L  Comprehensive metabolic panel     Status: Abnormal   Collection Time: 02/17/23  1:30 AM  Result Value Ref Range   Sodium 140 135 - 145 mmol/L   Potassium 2.4 (LL) 3.5 - 5.1 mmol/L   Chloride 104 98 - 111 mmol/L   CO2 27 22 - 32 mmol/L   Glucose, Bld 108 (H) 70 - 99 mg/dL   BUN 20 6 - 20 mg/dL   Creatinine, Ser 0.78 0.44 - 1.00 mg/dL    Calcium 8.6 (L) 8.9 - 10.3 mg/dL   Total Protein 6.4 (L) 6.5 - 8.1 g/dL   Albumin 3.6 3.5 - 5.0 g/dL   AST 16 15 - 41 U/L   ALT 15 0 - 44 U/L   Alkaline Phosphatase 47 38 - 126 U/L   Total Bilirubin 0.5 0.3 - 1.2 mg/dL   GFR, Estimated >60 >60 mL/min   Anion gap 9 5 - 15  CBC with Differential     Status: None   Collection Time: 02/17/23  1:30 AM  Result Value Ref Range   WBC 4.9 4.0 -  10.5 K/uL   RBC 4.27 3.87 - 5.11 MIL/uL   Hemoglobin 12.5 12.0 - 15.0 g/dL   HCT 37.8 36.0 - 46.0 %   MCV 88.5 80.0 - 100.0 fL   MCH 29.3 26.0 - 34.0 pg   MCHC 33.1 30.0 - 36.0 g/dL   RDW 13.0 11.5 - 15.5 %   Platelets 208 150 - 400 K/uL   nRBC 0.0 0.0 - 0.2 %   Neutrophils Relative % 60 %   Neutro Abs 2.9 1.7 - 7.7 K/uL   Lymphocytes Relative 35 %   Lymphs Abs 1.7 0.7 - 4.0 K/uL   Monocytes Relative 2 %   Monocytes Absolute 0.1 0.1 - 1.0 K/uL   Eosinophils Relative 3 %   Eosinophils Absolute 0.1 0.0 - 0.5 K/uL   Basophils Relative 0 %   Basophils Absolute 0.0 0.0 - 0.1 K/uL   Immature Granulocytes 0 %   Abs Immature Granulocytes 0.01 0.00 - 0.07 K/uL  Magnesium     Status: None   Collection Time: 02/17/23  1:30 AM  Result Value Ref Range   Magnesium 1.9 1.7 - 2.4 mg/dL  Urinalysis, Routine w reflex microscopic -Urine, Clean Catch     Status: Abnormal   Collection Time: 02/17/23  1:53 AM  Result Value Ref Range   Color, Urine YELLOW YELLOW   APPearance HAZY (A) CLEAR   Specific Gravity, Urine 1.015 1.005 - 1.030   pH 6.0 5.0 - 8.0   Glucose, UA NEGATIVE NEGATIVE mg/dL   Hgb urine dipstick SMALL (A) NEGATIVE   Bilirubin Urine NEGATIVE NEGATIVE   Ketones, ur NEGATIVE NEGATIVE mg/dL   Protein, ur NEGATIVE NEGATIVE mg/dL   Nitrite NEGATIVE NEGATIVE   Leukocytes,Ua LARGE (A) NEGATIVE   RBC / HPF 6-10 0 - 5 RBC/hpf   WBC, UA 11-20 0 - 5 WBC/hpf   Bacteria, UA RARE (A) NONE SEEN   Squamous Epithelial / HPF 0-5 0 - 5 /HPF   Mucus PRESENT   Basic metabolic panel     Status: Abnormal    Collection Time: 02/17/23  5:55 AM  Result Value Ref Range   Sodium 138 135 - 145 mmol/L   Potassium 2.8 (L) 3.5 - 5.1 mmol/L   Chloride 99 98 - 111 mmol/L   CO2 28 22 - 32 mmol/L   Glucose, Bld 108 (H) 70 - 99 mg/dL   BUN 21 (H) 6 - 20 mg/dL   Creatinine, Ser 0.81 0.44 - 1.00 mg/dL   Calcium 8.5 (L) 8.9 - 10.3 mg/dL   GFR, Estimated >60 >60 mL/min   Anion gap 11 5 - 15   Imaging Studies: No results found.  ED COURSE and MDM  Nursing notes, initial and subsequent vitals signs, including pulse oximetry, reviewed and interpreted by myself.  Vitals:   02/17/23 0016 02/17/23 0315 02/17/23 0445 02/17/23 0503  BP: (!) 170/95 127/85 109/72   Pulse: 84 77 68   Resp: 18 14 16    Temp: 98.4 F (36.9 C)   97.8 F (36.6 C)  TempSrc: Oral   Oral  SpO2: 98% 98% 97%   Weight: 74.8 kg     Height: 5\' 5"  (1.651 m)      Medications  lactated ringers 1,000 mL with potassium chloride 20 mEq infusion ( Intravenous New Bag/Given 02/17/23 0300)  morphine (PF) 2 MG/ML injection 2 mg (has no administration in time range)  ondansetron (ZOFRAN) injection 4 mg (4 mg Intravenous Given 02/17/23 0118)  fentaNYL (SUBLIMAZE) injection 100 mcg (  100 mcg Intravenous Given 02/17/23 0123)  cefTRIAXone (ROCEPHIN) 1 g in sodium chloride 0.9 % 100 mL IVPB (0 g Intravenous Stopped 02/17/23 0318)  potassium chloride SA (KLOR-CON M) CR tablet 40 mEq (40 mEq Oral Given 02/17/23 0504)   2:36 AM Patient's potassium is 2.4. Her urinalysis is suspicious for urinary tract infection and has been sent for culture.  We will start Rocephin for this.  Discussed with Dr. Claria Dice of the hospitalist service.  She would like to give her 40 milliequivalents of K-Dur every hour x 3 doses and we will recheck her potassium at 6 AM.  If potassium is still unacceptably low at that time we will admit.  6:32 AM Potassium up to 2.8 after three doses of K-Dur 100mEq.  Dr. Claria Dice recommends we treat her with K-Dur 40 mEq twice daily for 2 more days  and then have her follow-up with her PCP for recheck or return to the ED if symptoms worsen.  At the present time her back and lower extremity discomfort have improved significantly.  It is unclear if the acute drop in potassium is due to her chemotherapy as her prechemotherapy potassium was 2.8.  We will treat her UTI with Keflex.  PROCEDURES  Procedures   ED DIAGNOSES     ICD-10-CM   1. Hypokalemia  E87.6     2. Lower urinary tract infectious disease  N39.0          Angeleah Labrake, MD 02/17/23 [phone]

## 2023-02-17 NOTE — Progress Notes (Signed)
This is a 47 year old female with past medical history of AML, in remission.  Currently she has metastatic breast cancer.  She started chemotherapy 02/12/2023.  She presents to the ER with complaints of bodyaches in her back and lower extremity.  Blood work revealed a potassium of 2.4.  Magnesium 1.9.  Admission requested

## 2023-02-18 ENCOUNTER — Ambulatory Visit: Payer: BC Managed Care – PPO | Admitting: Rehabilitation

## 2023-02-18 ENCOUNTER — Inpatient Hospital Stay (HOSPITAL_BASED_OUTPATIENT_CLINIC_OR_DEPARTMENT_OTHER): Payer: BC Managed Care – PPO | Admitting: Physician Assistant

## 2023-02-18 ENCOUNTER — Inpatient Hospital Stay: Payer: BC Managed Care – PPO

## 2023-02-18 ENCOUNTER — Other Ambulatory Visit: Payer: Self-pay

## 2023-02-18 VITALS — BP 147/95 | HR 81 | Temp 97.9°F | Resp 18 | Ht 65.0 in | Wt 168.3 lb

## 2023-02-18 DIAGNOSIS — Z5112 Encounter for antineoplastic immunotherapy: Secondary | ICD-10-CM | POA: Diagnosis not present

## 2023-02-18 DIAGNOSIS — C50911 Malignant neoplasm of unspecified site of right female breast: Secondary | ICD-10-CM | POA: Diagnosis not present

## 2023-02-18 DIAGNOSIS — C9201 Acute myeloblastic leukemia, in remission: Secondary | ICD-10-CM | POA: Diagnosis not present

## 2023-02-18 DIAGNOSIS — Z86000 Personal history of in-situ neoplasm of breast: Secondary | ICD-10-CM | POA: Diagnosis not present

## 2023-02-18 DIAGNOSIS — C50919 Malignant neoplasm of unspecified site of unspecified female breast: Secondary | ICD-10-CM | POA: Diagnosis not present

## 2023-02-18 DIAGNOSIS — D1803 Hemangioma of intra-abdominal structures: Secondary | ICD-10-CM | POA: Diagnosis not present

## 2023-02-18 DIAGNOSIS — R519 Headache, unspecified: Secondary | ICD-10-CM | POA: Diagnosis not present

## 2023-02-18 DIAGNOSIS — Z923 Personal history of irradiation: Secondary | ICD-10-CM | POA: Diagnosis not present

## 2023-02-18 DIAGNOSIS — R300 Dysuria: Secondary | ICD-10-CM

## 2023-02-18 DIAGNOSIS — R0781 Pleurodynia: Secondary | ICD-10-CM | POA: Diagnosis not present

## 2023-02-18 DIAGNOSIS — Z5181 Encounter for therapeutic drug level monitoring: Secondary | ICD-10-CM | POA: Diagnosis not present

## 2023-02-18 DIAGNOSIS — C787 Secondary malignant neoplasm of liver and intrahepatic bile duct: Secondary | ICD-10-CM

## 2023-02-18 DIAGNOSIS — M2550 Pain in unspecified joint: Secondary | ICD-10-CM

## 2023-02-18 DIAGNOSIS — K769 Liver disease, unspecified: Secondary | ICD-10-CM | POA: Diagnosis not present

## 2023-02-18 DIAGNOSIS — Z170 Estrogen receptor positive status [ER+]: Secondary | ICD-10-CM | POA: Diagnosis not present

## 2023-02-18 DIAGNOSIS — Z79899 Other long term (current) drug therapy: Secondary | ICD-10-CM | POA: Diagnosis not present

## 2023-02-18 DIAGNOSIS — I10 Essential (primary) hypertension: Secondary | ICD-10-CM | POA: Diagnosis not present

## 2023-02-18 DIAGNOSIS — Z9011 Acquired absence of right breast and nipple: Secondary | ICD-10-CM | POA: Diagnosis not present

## 2023-02-18 LAB — CMP (CANCER CENTER ONLY)
ALT: 23 U/L (ref 0–44)
AST: 19 U/L (ref 15–41)
Albumin: 4.4 g/dL (ref 3.5–5.0)
Alkaline Phosphatase: 52 U/L (ref 38–126)
Anion gap: 8 (ref 5–15)
BUN: 18 mg/dL (ref 6–20)
CO2: 30 mmol/L (ref 22–32)
Calcium: 9.5 mg/dL (ref 8.9–10.3)
Chloride: 102 mmol/L (ref 98–111)
Creatinine: 0.72 mg/dL (ref 0.44–1.00)
GFR, Estimated: >60 mL/min (ref >60)
Glucose, Bld: 96 mg/dL (ref 70–99)
Potassium: 3.6 mmol/L (ref 3.5–5.1)
Sodium: 140 mmol/L (ref 135–145)
Total Bilirubin: 0.3 mg/dL (ref 0.3–1.2)
Total Protein: 7.3 g/dL (ref 6.5–8.1)

## 2023-02-18 LAB — CBC WITH DIFFERENTIAL (CANCER CENTER ONLY)
Abs Immature Granulocytes: 0.01 10*3/uL (ref 0.00–0.07)
Basophils Absolute: 0 10*3/uL (ref 0.0–0.1)
Basophils Relative: 1 %
Eosinophils Absolute: 0.1 10*3/uL (ref 0.0–0.5)
Eosinophils Relative: 3 %
HCT: 44 % (ref 36.0–46.0)
Hemoglobin: 14.4 g/dL (ref 12.0–15.0)
Immature Granulocytes: 0 %
Lymphocytes Relative: 31 %
Lymphs Abs: 1.1 10*3/uL (ref 0.7–4.0)
MCH: 29.4 pg (ref 26.0–34.0)
MCHC: 32.7 g/dL (ref 30.0–36.0)
MCV: 89.8 fL (ref 80.0–100.0)
Monocytes Absolute: 0 10*3/uL — ABNORMAL LOW (ref 0.1–1.0)
Monocytes Relative: 1 %
Neutro Abs: 2.3 10*3/uL (ref 1.7–7.7)
Neutrophils Relative %: 64 %
Platelet Count: 208 10*3/uL (ref 150–400)
RBC: 4.9 MIL/uL (ref 3.87–5.11)
RDW: 12.9 % (ref 11.5–15.5)
WBC Count: 3.5 10*3/uL — ABNORMAL LOW (ref 4.0–10.5)
nRBC: 0 % (ref 0.0–0.2)

## 2023-02-18 LAB — URINALYSIS, COMPLETE (UACMP) WITH MICROSCOPIC
Bacteria, UA: NONE SEEN
Bilirubin Urine: NEGATIVE
Glucose, UA: NEGATIVE mg/dL
Hgb urine dipstick: NEGATIVE
Ketones, ur: NEGATIVE mg/dL
Nitrite: NEGATIVE
Protein, ur: NEGATIVE mg/dL
Specific Gravity, Urine: 1.013 (ref 1.005–1.030)
pH: 5 (ref 5.0–8.0)

## 2023-02-18 LAB — URINE CULTURE

## 2023-02-18 LAB — MAGNESIUM: Magnesium: 2 mg/dL (ref 1.7–2.4)

## 2023-02-18 MED ORDER — CIPROFLOXACIN HCL 500 MG PO TABS: 500.0000 mg | ORAL_TABLET | Freq: Two times a day (BID) | ORAL | 0 refills | Status: AC

## 2023-02-18 NOTE — Progress Notes (Signed)
Symptom Management Consult Note [place]    Patient Care Team: Lujean Amel, MD as PCP - General (Family Medicine) Rockwell Germany, RN as Oncology Nurse Navigator [name], RN as Oncology Nurse Navigator Nicholas Lose, MD as Consulting Physician (Hematology and Oncology)    Name / MRN / DOB: Joann Meadows  [id number]  1976-08-03   Date of visit: 02/18/2023   Chief Complaint/Reason for visit: ED follow up   Current Therapy: Taxotere, Burley Saver and Kanjinti Last treatment:  Day 1   Cycle 1 on 02/14/23   ASSESSMENT & PLAN: Patient is a 47 y.o. female  with oncologic history of carcinoma of breast metastatic to liver followed by Dr. Lindi Adie.  I have viewed most recent oncology note and lab work.    #Carcinoma of breast metastatic to liver -Recently started chemotherapy - Next appointment with oncologist is 02/24/23   #Dysuria -Patient complaining of worsening dysuria and right flank pain. -On my exam patient is very well-appearing.  Afebrile hemodynamically stable.  No CVA tenderness.  Abdominal exam is benign. -Labs checked today show CBC with leukopenia, WBC 3.5, likely relates to treatment.  Hemoglobin and ANC are within normal limits.  CMP overall unremarkable. -She was noted to be hypokalemic in the ED.  Potassium today is 3.6.  Encourage patient to eat potassium rich foods.  She prefers to hold off on p.o. replacement at this time.  I feel this is reasonable in the absence of GI losses reported. -Chart review shows urine culture from ED visit with multiple species.  As patient is having worsening symptoms I will go ahead and check UA today and get new urine culture.   -Lower suspicion for renal stone as UA today is negative for blood, patient without severe pain reported, she is comfortable on exam.  Infection also seems improved with decrease in leukocytes and WBC. Patient has now been on antibiotics for 48 hours without improvement and in the  setting of leukopenia will switch to ciprofloxacin.   #Arthralgia -Patient ambulatory with normal gait. PET from 01/21/23 without skeletal mets.  No red flags on exam. - Per HPI, pain improved with movement. -Encouraged patient to try taking Tylenol for pain.  Could be related to her decreased activity causing stiffness.  Patient encouraged to let us know if pain does not improve. She will need repeat examination to see if imaging is warranted.     Strict ED precautions discussed should symptoms worsen.   Heme/Onc History: Oncology History  Breast cancer metastasized to liver ([place])  01/25/2019 Initial Diagnosis   Screening mammogram showed bilateral calcifications right breast 5 mm, pleomorphic.  Left breast 1.6 cm punctate, smaller group of calcifications spanning 3 mm.  Right lower outer quadrant biopsy revealed low-grade DCIS ER 90%, PR 70%; biopsy of left breast calcifications ALH and PASH   01/31/2019 Genetic Testing   ATM c.8495G>A and RECQL4 c.2587G>A VUS identified on the 9-gene STAT panel and Multi-cancer panel through [facility].  The STAT Breast cancer panel offered by [facility] includes sequencing and rearrangement analysis for the following 9 genes:  ATM, BRCA1, BRCA2, CDH1, CHEK2, PALB2, PTEN, STK11 and TP53.   The Multi-Gene Panel offered by [facility] includes sequencing and/or deletion duplication testing of the following 84 genes: AIP, ALK, APC, ATM, AXIN2,BAP1,  BARD1, BLM, BMPR1A, BRCA1, BRCA2, BRIP1, CASR, CDC73, CDH1, CDK4, CDKN1B, CDKN1C, CDKN2A (p14ARF), CDKN2A (p16INK4a), CEBPA, CHEK2, CTNNA1, DICER1, DIS3L2, EGFR (c.2369C>T, p.Thr790Met variant only), EPCAM (Deletion/duplication testing only), FH, FLCN,  GATA2, GPC3, GREM1 (Promoter region deletion/duplication testing only), HOXB13 (c.251G>A, p.Gly84Glu), HRAS, KIT, MAX, MEN1, MET, MITF (c.952G>A, p.Glu318Lys variant only), MLH1, MSH2, MSH3, MSH6, MUTYH, NBN, NF1, NF2, NTHL1, PALB2, PDGFRA, PHOX2B, PMS2, POLD1, POLE, POT1, PRKAR1A,  PTCH1, PTEN, RAD50, RAD51C, RAD51D, RB1, RECQL4, RET, RUNX1, SDHAF2, SDHA (sequence changes only), SDHB, SDHC, SDHD, SMAD4, SMARCA4, SMARCB1, SMARCE1, STK11, SUFU, TERC, TERT, TMEM127, TP53, TSC1, TSC2, VHL, WRN and WT1.  he report date is February 02, 2019.   02/11/2019 Surgery   RT lumpectomy: DICS Intermediate grade, Margins Neg, ER 90%, PR 70%; Lt Lumpectomy: UDH   02/26/2019 -  Anti-estrogen oral therapy   Tamoxifen 20mg  daily, stopped because of depression and facial numbness   05/20/2019 - 06/17/2019 Radiation Therapy   Adjuvant XRT   08/15/2021 Relapse/Recurrence   MRI surveillance detected Right breast calcifications 5 cm, Ant biopsy: IG DCIS with necrosis, ER 40%, PR 0%, Posterior biopsy: Intermediate grade DCIS with [facility]   08/19/2022 Cancer Staging   Staging form: Breast, AJCC 8th Edition - Clinical: Stage IV (cT22mi, cNX, cM1, G3, ER: Unknown, PR: Unknown, HER2: Unknown) - Signed by Nicholas Lose, MD on 02/06/2023 Histologic grading system: 3 grade system   02/14/2023 -  Chemotherapy   Patient is on Treatment Plan : BREAST DOCEtaxel + Trastuzumab + Pertuzumab (THP) q21d x 8 cycles / Trastuzumab + Pertuzumab q21d x 4 cycles     AML (acute myeloid leukemia) ([place])  09/1998 Initial Diagnosis   AML (acute myeloid leukemia) ([place]) treated with 2 induction regimens followed by 6 consolidation regimens, remission       Interval history-: Joann Meadows is a 47 y.o. female with oncologic history as above presenting to [facility] today with chief complaint of ED follow-up.  She is accompanied by her spouse who provides additional history.  Patient was seen in the ED in the early hours of 02/17/23 for back pain. Chart review shows UA was positive for infection and she was treated with Rocephin and given a prescription for Keflex.  She also had hypokalemia 2.4, received IV replacement and discharged with 2 days of p.o. potassium.   Patient states she is still having dysuria.  It feels slightly worse and  she continues to have urinary frequency.  Denies any gross hematuria.  She is also now having pain in her right lower back as well as continued pain in her lower extremities.  She describes that pain as aching.  Pain is intermittent.  She states the pain actually gets better with movement.  She denies any fall or injury.  She has not taken any over-the-counter medications for her pain. Pain is currently 2/10 in severity. She denies history of kidney stones. Her appetite has been normal. She denies fever, nausea or vomiting.       ROS  All other systems are reviewed and are negative for acute change except as noted in the HPI.    Allergies  Allergen Reactions   Tamoxifen Citrate     Other reaction(s): depression, joint pain     Past Medical History:  Diagnosis Date   Breast cancer ([place]) 2020   Right Breast Cancer   Endometriosis    Fibroid    Hx of radiation therapy 06/2019   Leukemia ([place])    20 years ago   Personal history of radiation therapy 2020   Right Breast Cancer   PONV (postoperative nausea and vomiting)    pt states she vomits after colonoscopies     Past Surgical History:  Procedure  Laterality Date   BREAST EXCISIONAL BIOPSY Left 02/11/2019   [facility]   BREAST LUMPECTOMY Right 02/11/2019   BREAST LUMPECTOMY WITH RADIOACTIVE SEED LOCALIZATION Bilateral 02/11/2019   Procedure: BILATERAL BREAST LUMPECTOMIES WITH BILATERAL RADIOACTIVE SEED LOCALIZATION;  Surgeon: Donnie Mesa, MD;  Location: [place];  Service: General;  Laterality: Bilateral;   MASTECTOMY Right 11/2021    Social History   Socioeconomic History   Marital status: Single    Spouse name: Not on file   Number of children: 2   Years of education: Not on file   Highest education level: High school graduate  Occupational History   Not on file  Tobacco Use   Smoking status: Former    Years: 10    Types: Cigarettes    Quit date: 01/25/2015    Years since quitting: 8.0   Smokeless  tobacco: Never  Vaping Use   Vaping Use: Never used  Substance and Sexual Activity   Alcohol use: No   Drug use: No   Sexual activity: Yes    Birth control/protection: None  Other Topics Concern   Not on file  Social History Narrative   Lives with children   Social Determinants of Health   Financial Resource Strain: Not on file  Food Insecurity: No Food Insecurity (10/03/2022)   Hunger Vital Sign    Worried About Running Out of Food in the Last Year: Never true    Ran Out of Food in the Last Year: Never true  Transportation Needs: No Transportation Needs (10/03/2022)   PRAPARE - Hydrologist (Medical): No    Lack of Transportation (Non-Medical): No  Physical Activity: Not on file  Stress: Not on file  Social Connections: Not on file  Intimate Partner Violence: Not At Risk (02/26/2019)   Humiliation, Afraid, Rape, and Kick questionnaire    Fear of Current or Ex-Partner: No    Emotionally Abused: No    Physically Abused: No    Sexually Abused: No    Family History  Problem Relation Age of Onset   Colon cancer Mother 28       d. 31   Hypertension Father    Diabetes Maternal Aunt    Diabetes Maternal Uncle    Mental retardation Maternal Uncle    Diabetes Paternal Aunt    Diabetes Maternal Grandmother    Stroke Paternal Grandmother        c. 46   Leukemia Paternal Grandfather        d. 5; ?CLL   Leukemia Cousin 4       mat first cousin   Lymphoma Cousin 48       pat first cousin     Current Outpatient Medications:    ciprofloxacin (CIPRO) 500 MG tablet, Take 1 tablet (500 mg total) by mouth 2 (two) times daily for 7 days., Disp: 14 tablet, Rfl: 0   ALPRAZolam (XANAX) 0.25 MG tablet, Take 0.25 mg by mouth daily as needed., Disp: , Rfl:    cephALEXin (KEFLEX) 500 MG capsule, Take 1 capsule (500 mg total) by mouth 2 (two) times daily for 5 days., Disp: 10 capsule, Rfl: 0   hydrochlorothiazide (HYDRODIURIL) 25 MG tablet, TAKE 1 TABLET BY  MOUTH EVERY DAY IN THE MORNING FOR 30 DAYS ONCE A DAY, Disp: , Rfl:    magic mouthwash w/lidocaine SOLN, Take 5 mLs by mouth 4 (four) times daily as needed for mouth pain., Disp: 240 mL, Rfl: 2   ondansetron (  ZOFRAN) 8 MG tablet, Take 1 tablet (8 mg total) by mouth every 8 (eight) hours as needed for nausea or vomiting., Disp: 30 tablet, Rfl: 1   potassium chloride SA (KLOR-CON M) 20 MEQ tablet, Take 2 tablets (40 mEq total) by mouth 2 (two) times daily for 2 days., Disp: 8 tablet, Rfl: 0   VITAMIN D PO, Take 2,000 mg by mouth daily., Disp: , Rfl:   PHYSICAL EXAM: ECOG FS:1 - Symptomatic but completely ambulatory    Vitals:   02/18/23 0900  BP: (!) 147/95  Pulse: 81  Resp: 18  Temp: 97.9 F (36.6 C)  TempSrc: Oral  SpO2: 99%  Weight: 168 lb 4.8 oz (76.3 kg)  Height: 5\' 5"  (1.651 m)   Physical Exam Vitals and nursing note reviewed.  Constitutional:      Appearance: She is well-developed. She is not ill-appearing or toxic-appearing.  HENT:     Head: Normocephalic.     Nose: Nose normal.     Mouth/Throat:     Mouth: Mucous membranes are moist.  Eyes:     Conjunctiva/sclera: Conjunctivae normal.  Neck:     Vascular: No JVD.  Cardiovascular:     Rate and Rhythm: Normal rate and regular rhythm.     Pulses: Normal pulses.     Heart sounds: Normal heart sounds.  Pulmonary:     Effort: Pulmonary effort is normal.     Breath sounds: Normal breath sounds.  Abdominal:     General: There is no distension.     Palpations: Abdomen is soft. There is no mass.     Tenderness: There is no abdominal tenderness. There is no right CVA tenderness, left CVA tenderness, guarding or rebound.     Hernia: No hernia is present.  Musculoskeletal:        General: Normal range of motion.     Cervical back: Normal range of motion.     Right lower leg: No edema.     Left lower leg: No edema.  Skin:    General: Skin is warm and dry.  Neurological:     Mental Status: She is oriented to person,  place, and time.     Comments: Ambulatory with normal gait.  No focal weakness.  Strong and equal grip strength in all extremities.  Sensation intact.  No saddle anesthesia.        LABORATORY DATA: I have reviewed the data as listed    Latest Ref Rng & Units 02/18/2023    8:46 AM 02/17/2023    1:30 AM 02/14/2023    7:49 AM  CBC  WBC 4.0 - 10.5 K/uL 3.5  4.9  9.0   Hemoglobin 12.0 - 15.0 g/dL 14.4  12.5  14.0   Hematocrit 36.0 - 46.0 % 44.0  37.8  41.6   Platelets 150 - 400 K/uL 208  208  269         Latest Ref Rng & Units 02/18/2023    8:46 AM 02/17/2023    5:55 AM 02/17/2023    1:30 AM  CMP  Glucose 70 - 99 mg/dL 96  108  108   BUN 6 - 20 mg/dL 18  21  20    Creatinine 0.44 - 1.00 mg/dL 0.72  0.81  0.78   Sodium 135 - 145 mmol/L 140  138  140   Potassium 3.5 - 5.1 mmol/L 3.6  2.8  2.4   Chloride 98 - 111 mmol/L 102  99  104   CO2  22 - 32 mmol/L 30  28  27    Calcium 8.9 - 10.3 mg/dL 9.5  8.5  8.6   Total Protein 6.5 - 8.1 g/dL 7.3   6.4   Total Bilirubin 0.3 - 1.2 mg/dL 0.3   0.5   Alkaline Phos 38 - 126 U/L 52   47   AST 15 - 41 U/L 19   16   ALT 0 - 44 U/L 23   15        RADIOGRAPHIC STUDIES (from last 24 hours if applicable) I have personally reviewed the radiological images as listed and agreed with the findings in the report. No results found.      Visit Diagnosis: 1. Dysuria   2. Carcinoma of breast metastatic to liver, unspecified laterality ([place])   3. Arthralgia, unspecified joint      Orders Placed This Encounter  Procedures   Culture, Urine    Standing Status:   Standing    Number of Occurrences:   1    Standing Expiration Date:   02/18/2024   Urinalysis, Complete w Microscopic    Standing Status:   Standing    Number of Occurrences:   1    Standing Expiration Date:   02/18/2024    All questions were answered. The patient knows to call the clinic with any problems, questions or concerns. No barriers to learning was detected.  I have spent a  total of 30 minutes minutes of face-to-face and non-face-to-face time, preparing to see the patient, obtaining and/or reviewing separately obtained history, performing a medically appropriate examination, counseling and educating the patient, ordering tests, documenting clinical information in the electronic health record, and care coordination (communications with other health care professionals or caregivers).    Thank you for allowing me to participate in the care of this patient.    Barrie Folk, PA-C Department of Hematology/Oncology [facility] at [facility] Phone: [phone]  [phone]    02/18/2023 12:51 PM

## 2023-02-18 NOTE — Progress Notes (Signed)
Patient Care Team: Lujean Amel, MD as PCP - General (Family Medicine) Rockwell Germany, RN as Oncology Nurse Navigator [name], RN as Oncology Nurse Navigator Nicholas Lose, MD as Consulting Physician (Hematology and Oncology) Default, Provider, MD as Technician  DIAGNOSIS:  Encounter Diagnosis  Name Primary?   Carcinoma of breast metastatic to liver, unspecified laterality Yes    SUMMARY OF ONCOLOGIC HISTORY: Oncology History  Breast cancer metastasized to liver  01/25/2019 Initial Diagnosis   Screening mammogram showed bilateral calcifications right breast 5 mm, pleomorphic.  Left breast 1.6 cm punctate, smaller group of calcifications spanning 3 mm.  Right lower outer quadrant biopsy revealed low-grade DCIS ER 90%, PR 70%; biopsy of left breast calcifications ALH and PASH   01/31/2019 Genetic Testing   ATM c.8495G>A and RECQL4 c.2587G>A VUS identified on the 9-gene STAT panel and Multi-cancer panel through [facility].  The STAT Breast cancer panel offered by [facility] includes sequencing and rearrangement analysis for the following 9 genes:  ATM, BRCA1, BRCA2, CDH1, CHEK2, PALB2, PTEN, STK11 and TP53.   The Multi-Gene Panel offered by [facility] includes sequencing and/or deletion duplication testing of the following 84 genes: AIP, ALK, APC, ATM, AXIN2,BAP1,  BARD1, BLM, BMPR1A, BRCA1, BRCA2, BRIP1, CASR, CDC73, CDH1, CDK4, CDKN1B, CDKN1C, CDKN2A (p14ARF), CDKN2A (p16INK4a), CEBPA, CHEK2, CTNNA1, DICER1, DIS3L2, EGFR (c.2369C>T, p.Thr790Met variant only), EPCAM (Deletion/duplication testing only), FH, FLCN, GATA2, GPC3, GREM1 (Promoter region deletion/duplication testing only), HOXB13 (c.251G>A, p.Gly84Glu), HRAS, KIT, MAX, MEN1, MET, MITF (c.952G>A, p.Glu318Lys variant only), MLH1, MSH2, MSH3, MSH6, MUTYH, NBN, NF1, NF2, NTHL1, PALB2, PDGFRA, PHOX2B, PMS2, POLD1, POLE, POT1, PRKAR1A, PTCH1, PTEN, RAD50, RAD51C, RAD51D, RB1, RECQL4, RET, RUNX1, SDHAF2, SDHA (sequence changes only), SDHB,  SDHC, SDHD, SMAD4, SMARCA4, SMARCB1, SMARCE1, STK11, SUFU, TERC, TERT, TMEM127, TP53, TSC1, TSC2, VHL, WRN and WT1.  he report date is February 02, 2019.   02/11/2019 Surgery   RT lumpectomy: DICS Intermediate grade, Margins Neg, ER 90%, PR 70%; Lt Lumpectomy: UDH   02/26/2019 -  Anti-estrogen oral therapy   Tamoxifen 20mg  daily, stopped because of depression and facial numbness   05/20/2019 - 06/17/2019 Radiation Therapy   Adjuvant XRT   08/15/2021 Relapse/Recurrence   MRI surveillance detected Right breast calcifications 5 cm, Ant biopsy: IG DCIS with necrosis, ER 40%, PR 0%, Posterior biopsy: Intermediate grade DCIS with [facility]   08/19/2022 Cancer Staging   Staging form: Breast, AJCC 8th Edition - Clinical: Stage IV (cT33mi, cNX, cM1, G3, ER: Unknown, PR: Unknown, HER2: Unknown) - Signed by Nicholas Lose, MD on 02/06/2023 Histologic grading system: 3 grade system   02/14/2023 -  Chemotherapy   Patient is on Treatment Plan : BREAST DOCEtaxel + Trastuzumab + Pertuzumab (THP) q21d x 8 cycles / Trastuzumab + Pertuzumab q21d x 4 cycles     AML (acute myeloid leukemia)  09/1998 Initial Diagnosis   AML (acute myeloid leukemia) ([place]) treated with 2 induction regimens followed by 6 consolidation regimens, remission     CHIEF COMPLIANT:  Taxotere Herceptin Perjeta cycle 1 day 10   INTERVAL HISTORY: Joann Meadows is a 47 y.o. with above-mentioned history of right breast DCIS for which she underwent a lumpectomy. She presents to the clinic today for a follow-up. She reports UTI, low potassium, pain in muscles, soreness in mouth fatigue, and complains of taste buds gone. Denies any diarrhea. She said the fatigue is getting a little better.   ALLERGIES:  is allergic to tamoxifen citrate.  MEDICATIONS:  Current Outpatient Medications  Medication Sig Dispense Refill  ALPRAZolam (XANAX) 0.25 MG tablet Take 0.25 mg by mouth daily as needed.     ciprofloxacin (CIPRO) 500 MG tablet Take 1 tablet (500 mg  total) by mouth 2 (two) times daily for 7 days. 14 tablet 0   hydrochlorothiazide (HYDRODIURIL) 25 MG tablet TAKE 1 TABLET BY MOUTH EVERY DAY IN THE MORNING FOR 30 DAYS ONCE A DAY     magic mouthwash w/lidocaine SOLN Take 5 mLs by mouth 4 (four) times daily as needed for mouth pain. 240 mL 2   ondansetron (ZOFRAN) 8 MG tablet Take 1 tablet (8 mg total) by mouth every 8 (eight) hours as needed for nausea or vomiting. 30 tablet 1   VITAMIN D PO Take 2,000 mg by mouth daily.     potassium chloride SA (KLOR-CON M) 20 MEQ tablet Take 2 tablets (40 mEq total) by mouth 2 (two) times daily for 2 days. 8 tablet 0   No current facility-administered medications for this visit.    PHYSICAL EXAMINATION: ECOG PERFORMANCE STATUS: 1 - Symptomatic but completely ambulatory  Vitals:   02/24/23 1111  BP: (!) 143/83  Pulse: 87  Resp: 18  Temp: (!) 97.4 F (36.3 C)  SpO2: 99%   Filed Weights   02/24/23 1111  Weight: 169 lb 4.8 oz (76.8 kg)      LABORATORY DATA:  I have reviewed the data as listed    Latest Ref Rng & Units 02/20/2023    1:04 PM 02/18/2023    8:46 AM 02/17/2023    5:55 AM  CMP  Glucose 70 - 99 mg/dL 90  96  108   BUN 6 - 20 mg/dL 17  18  21    Creatinine 0.44 - 1.00 mg/dL 0.75  0.72  0.81   Sodium 135 - 145 mmol/L 140  140  138   Potassium 3.5 - 5.1 mmol/L 3.6  3.6  2.8   Chloride 98 - 111 mmol/L 104  102  99   CO2 22 - 32 mmol/L 28  30  28    Calcium 8.9 - 10.3 mg/dL 9.6  9.5  8.5   Total Protein 6.5 - 8.1 g/dL 7.8  7.3    Total Bilirubin 0.3 - 1.2 mg/dL 0.3  0.3    Alkaline Phos 38 - 126 U/L 56  52    AST 15 - 41 U/L 17  19    ALT 0 - 44 U/L 28  23      Lab Results  Component Value Date   WBC 1.7 (L) 02/24/2023   HGB 12.8 02/24/2023   HCT 36.4 02/24/2023   MCV 85.4 02/24/2023   PLT 256 02/24/2023   NEUTROABS PENDING 02/24/2023    ASSESSMENT & PLAN:  Breast cancer metastasized to liver ([place]) 02/11/19: RT lumpectomy: DICS Intermediate grade, Margins Neg, ER 90%, PR  70%; Lt Lumpectomy: UDH status post radiation, could not tolerate tamoxifen recurrence: 08/15/2021:MRI surveillance detected Right breast calcifications 5 cm, Ant biopsy: IG DCIS with necrosis, ER 40%, PR 0%, Posterior biopsy: Intermediate grade DCIS with [facility]  11/29/2021: Right mastectomy: High-grade DCIS with microinvasion, ER 0%, PR 0%, HER2 positive, 0/1 lymph node negative  T1c MIC, N0: Stage Ia   Patient had second opinion at [place] with Dr. Harden Mo who agreed with her plan of no systemic chemotherapy   Breast cancer surveillance: 1.  Mammogram 08/22/2022: Left breast benign breast density category B  2.  CT CAP 12/09/2022: Multiple liver lesions indicative of cysts and hemangiomas new right hepatic lobe  lesion favored to be hemangioma otherwise no evidence of metastatic disease.  Radiology recommended an MRI of the liver. 3.  Liver MRI 01/08/2023: 2 New rim-enhancing lesions 2.3 cm (was 1.6 cm on 12/09/2022), 1.8 cm (measured 1.5 cm), benign hemangioma 2.4 cm and 1.9 cm and 1.3 cm unchanged 4.  PET CT scan 01/22/2023: Liver: 2.5 cm and 2.4 cm lesions ------------------------------------------------------------------------------------------------------------------------------------------------- 01/31/2023: Liver lesion biopsy: Metastatic adenocarcinoma to the liver consistent with breast primary positive for CK7 and GATA3, ER 0%, PR 0%, Ki-67 25%, HER2 3+ positive, [facility] molecular testing is pending   Current treatment: Taxotere Herceptin Perjeta cycle 1 day 10 Chemo toxicities: Flank pain: saw symptom management Muscle cramps: Due to hypokalemia: Replaced Mild sores: Has Magic mouthwash Taste changes Neutropenia: ANC 0.5 for nadir count.  I will reduce the dosage of Taxotere to 65 mg/m.  I discussed with her that she could use some ice in the mouth when she receives her chemotherapy.  Meningioma: Follows with Dr. Mickeal Skinner  Return to clinic for cycle 2    Orders Placed This Encounter   Procedures   CBC with Differential ([place] Only)    Standing Status:   Future    Standing Expiration Date:   07/09/2024   CMP ([place] only)    Standing Status:   Future    Standing Expiration Date:   07/09/2024   CBC with Differential ([place] Only)    Standing Status:   Future    Standing Expiration Date:   07/30/2024   CMP ([place] only)    Standing Status:   Future    Standing Expiration Date:   07/30/2024   The patient has a good understanding of the overall plan. she agrees with it. she will call with any problems that may develop before the next visit here. Total time spent: 30 mins including face to face time and time spent for planning, charting and co-ordination of care   Joann Ohara, MD 02/24/23

## 2023-02-19 ENCOUNTER — Other Ambulatory Visit: Payer: BC Managed Care – PPO

## 2023-02-19 ENCOUNTER — Encounter: Payer: BC Managed Care – PPO | Admitting: Physician Assistant

## 2023-02-19 DIAGNOSIS — C787 Secondary malignant neoplasm of liver and intrahepatic bile duct: Secondary | ICD-10-CM | POA: Diagnosis not present

## 2023-02-19 DIAGNOSIS — C50511 Malignant neoplasm of lower-outer quadrant of right female breast: Secondary | ICD-10-CM | POA: Diagnosis not present

## 2023-02-19 LAB — URINE CULTURE: Culture: NO GROWTH

## 2023-02-20 ENCOUNTER — Other Ambulatory Visit: Payer: Self-pay

## 2023-02-20 ENCOUNTER — Telehealth: Payer: Self-pay | Admitting: *Deleted

## 2023-02-20 ENCOUNTER — Inpatient Hospital Stay (HOSPITAL_BASED_OUTPATIENT_CLINIC_OR_DEPARTMENT_OTHER): Payer: BC Managed Care – PPO | Admitting: Physician Assistant

## 2023-02-20 ENCOUNTER — Inpatient Hospital Stay: Payer: BC Managed Care – PPO

## 2023-02-20 ENCOUNTER — Encounter: Payer: BC Managed Care – PPO | Admitting: Rehabilitation

## 2023-02-20 ENCOUNTER — Inpatient Hospital Stay: Payer: BC Managed Care – PPO | Admitting: Dietician

## 2023-02-20 DIAGNOSIS — K769 Liver disease, unspecified: Secondary | ICD-10-CM | POA: Diagnosis not present

## 2023-02-20 DIAGNOSIS — Z5112 Encounter for antineoplastic immunotherapy: Secondary | ICD-10-CM | POA: Diagnosis not present

## 2023-02-20 DIAGNOSIS — C50919 Malignant neoplasm of unspecified site of unspecified female breast: Secondary | ICD-10-CM

## 2023-02-20 DIAGNOSIS — R109 Unspecified abdominal pain: Secondary | ICD-10-CM

## 2023-02-20 DIAGNOSIS — D1803 Hemangioma of intra-abdominal structures: Secondary | ICD-10-CM | POA: Diagnosis not present

## 2023-02-20 DIAGNOSIS — C50911 Malignant neoplasm of unspecified site of right female breast: Secondary | ICD-10-CM | POA: Diagnosis not present

## 2023-02-20 DIAGNOSIS — Z5181 Encounter for therapeutic drug level monitoring: Secondary | ICD-10-CM | POA: Diagnosis not present

## 2023-02-20 DIAGNOSIS — D701 Agranulocytosis secondary to cancer chemotherapy: Secondary | ICD-10-CM | POA: Diagnosis not present

## 2023-02-20 DIAGNOSIS — C9201 Acute myeloblastic leukemia, in remission: Secondary | ICD-10-CM | POA: Diagnosis not present

## 2023-02-20 DIAGNOSIS — R519 Headache, unspecified: Secondary | ICD-10-CM | POA: Diagnosis not present

## 2023-02-20 DIAGNOSIS — I10 Essential (primary) hypertension: Secondary | ICD-10-CM | POA: Diagnosis not present

## 2023-02-20 DIAGNOSIS — C787 Secondary malignant neoplasm of liver and intrahepatic bile duct: Secondary | ICD-10-CM

## 2023-02-20 DIAGNOSIS — R0781 Pleurodynia: Secondary | ICD-10-CM | POA: Diagnosis not present

## 2023-02-20 DIAGNOSIS — T451X5A Adverse effect of antineoplastic and immunosuppressive drugs, initial encounter: Secondary | ICD-10-CM

## 2023-02-20 DIAGNOSIS — Z9011 Acquired absence of right breast and nipple: Secondary | ICD-10-CM | POA: Diagnosis not present

## 2023-02-20 DIAGNOSIS — Z923 Personal history of irradiation: Secondary | ICD-10-CM | POA: Diagnosis not present

## 2023-02-20 DIAGNOSIS — Z170 Estrogen receptor positive status [ER+]: Secondary | ICD-10-CM | POA: Diagnosis not present

## 2023-02-20 DIAGNOSIS — Z86000 Personal history of in-situ neoplasm of breast: Secondary | ICD-10-CM | POA: Diagnosis not present

## 2023-02-20 DIAGNOSIS — Z79899 Other long term (current) drug therapy: Secondary | ICD-10-CM | POA: Diagnosis not present

## 2023-02-20 LAB — CMP (CANCER CENTER ONLY)
ALT: 28 U/L (ref 0–44)
AST: 17 U/L (ref 15–41)
Albumin: 4.6 g/dL (ref 3.5–5.0)
Alkaline Phosphatase: 56 U/L (ref 38–126)
Anion gap: 8 (ref 5–15)
BUN: 17 mg/dL (ref 6–20)
CO2: 28 mmol/L (ref 22–32)
Calcium: 9.6 mg/dL (ref 8.9–10.3)
Chloride: 104 mmol/L (ref 98–111)
Creatinine: 0.75 mg/dL (ref 0.44–1.00)
GFR, Estimated: >60 mL/min (ref >60)
Glucose, Bld: 90 mg/dL (ref 70–99)
Potassium: 3.6 mmol/L (ref 3.5–5.1)
Sodium: 140 mmol/L (ref 135–145)
Total Bilirubin: 0.3 mg/dL (ref 0.3–1.2)
Total Protein: 7.8 g/dL (ref 6.5–8.1)

## 2023-02-20 LAB — CBC WITH DIFFERENTIAL (CANCER CENTER ONLY)
Abs Immature Granulocytes: 0.02 10*3/uL (ref 0.00–0.07)
Basophils Absolute: 0.1 10*3/uL (ref 0.0–0.1)
Basophils Relative: 3 %
Eosinophils Absolute: 0 10*3/uL (ref 0.0–0.5)
Eosinophils Relative: 2 %
HCT: 40.9 % (ref 36.0–46.0)
Hemoglobin: 13.5 g/dL (ref 12.0–15.0)
Immature Granulocytes: 1 %
Lymphocytes Relative: 66 %
Lymphs Abs: 1.1 10*3/uL (ref 0.7–4.0)
MCH: 29 pg (ref 26.0–34.0)
MCHC: 33 g/dL (ref 30.0–36.0)
MCV: 88 fL (ref 80.0–100.0)
Monocytes Absolute: 0 10*3/uL — ABNORMAL LOW (ref 0.1–1.0)
Monocytes Relative: 2 %
Neutro Abs: 0.5 10*3/uL — ABNORMAL LOW (ref 1.7–7.7)
Neutrophils Relative %: 26 %
Platelet Count: 236 10*3/uL (ref 150–400)
RBC: 4.65 MIL/uL (ref 3.87–5.11)
RDW: 12.6 % (ref 11.5–15.5)
Smear Review: NORMAL
WBC Count: 1.7 10*3/uL — ABNORMAL LOW (ref 4.0–10.5)
nRBC: 0 % (ref 0.0–0.2)

## 2023-02-20 LAB — PREGNANCY, URINE: Preg Test, Ur: NEGATIVE

## 2023-02-20 NOTE — Progress Notes (Signed)
Nutrition Assessment   Reason for Assessment: Patient request (taste changes)   ASSESSMENT: 47 year old female with breast cancer metastatic to liver. She is receiving THP q21d. Patient under the care of Dr. Lindi Adie.   Met with patient in office. Patient reports poor appetite secondary to altered taste. She is unable to get down more than a couple of bites at a time. Patient endorses mouth sores s/p first treatment. She has noticed small sore surfacing on corner of mouth. Noted white patchy area on left side of tongue today. She has magic mouth wash. Patient has not started using this. Patient reports right sided flank pain that has acutely increased. Patient states she is concerned this is related to her liver. Patient asking if [facility] has availability to see her today. RD discussed with Emmit Alexanders via secure chat. Patient agreeable to be seen in infusion area given no beds available in [facility].   Nutrition Focused Physical Exam: deferred     Medications: xanax, hydrodiuril, magic mouthwash, zofran, klor-con, vit D   Labs: reviewed    Anthropometrics:   Height: 5'5" Weight: 168 lb 4.8 oz  UBW: 172 lb (Sept 2023) BMI: 28.01   NUTRITION DIAGNOSIS: Inadequate oral intake related to decreased appetite as evidenced by altered taste per pt report     INTERVENTION:  Educated on strategies for altered taste. Suggested trying baking soda salt water rinses several times daily before meals - handout with tips + recipe given Encouraged using magic mouthwash as prescribed Suggested trying oral nutrition supplement for added calories and protein - samples of Boost, Ensure, CIB provided  Monitor for imaging results Contact information provided    MONITORING, EVALUATION, GOAL: Patient will tolerate adequate calories and protein to minimize weight loss during treatment    Next Visit: To be scheduled as needed

## 2023-02-20 NOTE — Progress Notes (Signed)
Symptom Management Consult Note [place]    Patient Care Team: Lujean Amel, MD as PCP - General (Family Medicine) Rockwell Germany, RN as Oncology Nurse Navigator [name], RN as Oncology Nurse Navigator Nicholas Lose, MD as Consulting Physician (Hematology and Oncology) Default, Provider, MD as Technician    Name / MRN / DOB: Joann Meadows  [id number]  1976/09/17   Date of visit: 02/20/2023   Chief Complaint/Reason for visit: right flank pain   Current Therapy: Taxotere, Burley Saver and Kanjinti   Last treatment:  Day 1   Cycle 1 on 02/14/23   ASSESSMENT & PLAN: Patient is a 47 y.o. female  with oncologic history of carcinoma of breast metastatic to liver followed by Dr. Lindi Adie .  I have viewed most recent oncology note and lab work.    #Carcinoma of breast metastatic to liver  - Next appointment with oncologist is 02/24/23   #Right flank pain -Patient seen by me recently for UTI and started on Cipro. -Still having ongoing right flank pain since her cancer diagnosis and now pain acutely worsened.   - On my exam pain is nontoxic-appearing and sitting very comfortably.Pain is not reproducible on exam. Chart review shows urine culture had no growth. -CBC today shows neutropenia WBC 1.7 with ANC 0.5. Patient has no infectious symptoms, so G-CSF not needed at this time. I will have her continue the course of cipro as prophylaxis. CMP is unremarkable. - I had lengthy discussion with patient regarding her pain and workup of that. She does not wish to have any additional radiation exposure with a CT scan.  She is however very concerned about this pain.  Patient agrees to have right upper quadrant ultrasound to rule out any gallbladder or new liver pathology.  This will be performed tomorrow as she has not been n.p.o. prior to arrival today. Strict ED precautions discussed should symptoms worsen.         Heme/Onc History: Oncology History  Breast  cancer metastasized to liver ([place])  01/25/2019 Initial Diagnosis   Screening mammogram showed bilateral calcifications right breast 5 mm, pleomorphic.  Left breast 1.6 cm punctate, smaller group of calcifications spanning 3 mm.  Right lower outer quadrant biopsy revealed low-grade DCIS ER 90%, PR 70%; biopsy of left breast calcifications ALH and PASH   01/31/2019 Genetic Testing   ATM c.8495G>A and RECQL4 c.2587G>A VUS identified on the 9-gene STAT panel and Multi-cancer panel through [facility].  The STAT Breast cancer panel offered by [facility] includes sequencing and rearrangement analysis for the following 9 genes:  ATM, BRCA1, BRCA2, CDH1, CHEK2, PALB2, PTEN, STK11 and TP53.   The Multi-Gene Panel offered by [facility] includes sequencing and/or deletion duplication testing of the following 84 genes: AIP, ALK, APC, ATM, AXIN2,BAP1,  BARD1, BLM, BMPR1A, BRCA1, BRCA2, BRIP1, CASR, CDC73, CDH1, CDK4, CDKN1B, CDKN1C, CDKN2A (p14ARF), CDKN2A (p16INK4a), CEBPA, CHEK2, CTNNA1, DICER1, DIS3L2, EGFR (c.2369C>T, p.Thr790Met variant only), EPCAM (Deletion/duplication testing only), FH, FLCN, GATA2, GPC3, GREM1 (Promoter region deletion/duplication testing only), HOXB13 (c.251G>A, p.Gly84Glu), HRAS, KIT, MAX, MEN1, MET, MITF (c.952G>A, p.Glu318Lys variant only), MLH1, MSH2, MSH3, MSH6, MUTYH, NBN, NF1, NF2, NTHL1, PALB2, PDGFRA, PHOX2B, PMS2, POLD1, POLE, POT1, PRKAR1A, PTCH1, PTEN, RAD50, RAD51C, RAD51D, RB1, RECQL4, RET, RUNX1, SDHAF2, SDHA (sequence changes only), SDHB, SDHC, SDHD, SMAD4, SMARCA4, SMARCB1, SMARCE1, STK11, SUFU, TERC, TERT, TMEM127, TP53, TSC1, TSC2, VHL, WRN and WT1.  he report date is February 02, 2019.   02/11/2019 Surgery   RT lumpectomy: DICS  Intermediate grade, Margins Neg, ER 90%, PR 70%; Lt Lumpectomy: UDH   02/26/2019 -  Anti-estrogen oral therapy   Tamoxifen 20mg  daily, stopped because of depression and facial numbness   05/20/2019 - 06/17/2019 Radiation Therapy   Adjuvant XRT   08/15/2021  Relapse/Recurrence   MRI surveillance detected Right breast calcifications 5 cm, Ant biopsy: IG DCIS with necrosis, ER 40%, PR 0%, Posterior biopsy: Intermediate grade DCIS with [facility]   08/19/2022 Cancer Staging   Staging form: Breast, AJCC 8th Edition - Clinical: Stage IV (cT44mi, cNX, cM1, G3, ER: Unknown, PR: Unknown, HER2: Unknown) - Signed by Nicholas Lose, MD on 02/06/2023 Histologic grading system: 3 grade system   02/14/2023 -  Chemotherapy   Patient is on Treatment Plan : BREAST DOCEtaxel + Trastuzumab + Pertuzumab (THP) q21d x 8 cycles / Trastuzumab + Pertuzumab q21d x 4 cycles     AML (acute myeloid leukemia) ([place])  09/1998 Initial Diagnosis   AML (acute myeloid leukemia) ([place]) treated with 2 induction regimens followed by 6 consolidation regimens, remission       Interval history-: Joann Meadows is a 47 y.o. female with oncologic history as above presenting to [facility] today with chief complaint of right flank pain.  Patient presents unaccompanied today.  Patient states ever since she was found to have multiple liver lesions in January 2024 she has had intermittent right flank pain.  The pain is usually mild however it worsened today.  She reports while walking in to the cancer center she had pain in her right flank that was aching.  The pain did not radiate.  She rates it 6 out of 10 in severity.  Usually movement helps her pain and that is why she is concerned that is now making her pain worse.  She had no relief while sitting down for her appointment earlier.  Patient states she tried taking Tylenol last night when her pain was mild and feels as if it helped some.  She states she has not had any fevers.  She continues to have dysuria when first waking up in the morning only.  She increased her fluid intake and noticed dysuria during the day is absent.  She denies any gross hematuria.  Patient also reports she typically has bowel movements daily.  She did not have 1 yesterday.  She did have  1 this morning and noted that she had to strain.  Her stool was soft in consistency although she did have bright red blood on the toilet paper while wiping.  It was only a very small amount.  She does have a history of hemorrhoids from pregnancy.  Patient has not had any fever or chills.  Her appetite is decreased because of her taste changes from chemo.    ROS  All other systems are reviewed and are negative for acute change except as noted in the HPI.    Allergies  Allergen Reactions   Tamoxifen Citrate     Other reaction(s): depression, joint pain     Past Medical History:  Diagnosis Date   Breast cancer ([place]) 2020   Right Breast Cancer   Endometriosis    Fibroid    Hx of radiation therapy 06/2019   Leukemia ([place])    20 years ago   Personal history of radiation therapy 2020   Right Breast Cancer   PONV (postoperative nausea and vomiting)    pt states she vomits after colonoscopies     Past Surgical History:  Procedure Laterality Date  BREAST EXCISIONAL BIOPSY Left 02/11/2019   [facility]   BREAST LUMPECTOMY Right 02/11/2019   BREAST LUMPECTOMY WITH RADIOACTIVE SEED LOCALIZATION Bilateral 02/11/2019   Procedure: BILATERAL BREAST LUMPECTOMIES WITH BILATERAL RADIOACTIVE SEED LOCALIZATION;  Surgeon: Donnie Mesa, MD;  Location: [facility];  Service: General;  Laterality: Bilateral;   MASTECTOMY Right 11/2021    Social History   Socioeconomic History   Marital status: Single    Spouse name: Not on file   Number of children: 2   Years of education: Not on file   Highest education level: High school graduate  Occupational History   Not on file  Tobacco Use   Smoking status: Former    Years: 10    Types: Cigarettes    Quit date: 01/25/2015    Years since quitting: 8.0   Smokeless tobacco: Never  Vaping Use   Vaping Use: Never used  Substance and Sexual Activity   Alcohol use: No   Drug use: No   Sexual activity: Yes    Birth control/protection:  None  Other Topics Concern   Not on file  Social History Narrative   Lives with children   Social Determinants of Health   Financial Resource Strain: Not on file  Food Insecurity: No Food Insecurity (10/03/2022)   Hunger Vital Sign    Worried About Running Out of Food in the Last Year: Never true    Ran Out of Food in the Last Year: Never true  Transportation Needs: No Transportation Needs (10/03/2022)   PRAPARE - Hydrologist (Medical): No    Lack of Transportation (Non-Medical): No  Physical Activity: Not on file  Stress: Not on file  Social Connections: Not on file  Intimate Partner Violence: Not At Risk (02/26/2019)   Humiliation, Afraid, Rape, and Kick questionnaire    Fear of Current or Ex-Partner: No    Emotionally Abused: No    Physically Abused: No    Sexually Abused: No    Family History  Problem Relation Age of Onset   Colon cancer Mother 28       d. 7   Hypertension Father    Diabetes Maternal Aunt    Diabetes Maternal Uncle    Mental retardation Maternal Uncle    Diabetes Paternal Aunt    Diabetes Maternal Grandmother    Stroke Paternal Grandmother        c. 60   Leukemia Paternal Grandfather        d. 61; ?CLL   Leukemia Cousin 4       mat first cousin   Lymphoma Cousin 78       pat first cousin     Current Outpatient Medications:    ALPRAZolam (XANAX) 0.25 MG tablet, Take 0.25 mg by mouth daily as needed., Disp: , Rfl:    cephALEXin (KEFLEX) 500 MG capsule, Take 1 capsule (500 mg total) by mouth 2 (two) times daily for 5 days., Disp: 10 capsule, Rfl: 0   ciprofloxacin (CIPRO) 500 MG tablet, Take 1 tablet (500 mg total) by mouth 2 (two) times daily for 7 days., Disp: 14 tablet, Rfl: 0   hydrochlorothiazide (HYDRODIURIL) 25 MG tablet, TAKE 1 TABLET BY MOUTH EVERY DAY IN THE MORNING FOR 30 DAYS ONCE A DAY, Disp: , Rfl:    magic mouthwash w/lidocaine SOLN, Take 5 mLs by mouth 4 (four) times daily as needed for mouth pain.,  Disp: 240 mL, Rfl: 2   ondansetron (ZOFRAN) 8 MG tablet,  Take 1 tablet (8 mg total) by mouth every 8 (eight) hours as needed for nausea or vomiting., Disp: 30 tablet, Rfl: 1   potassium chloride SA (KLOR-CON M) 20 MEQ tablet, Take 2 tablets (40 mEq total) by mouth 2 (two) times daily for 2 days., Disp: 8 tablet, Rfl: 0   VITAMIN D PO, Take 2,000 mg by mouth daily., Disp: , Rfl:   PHYSICAL EXAM: ECOG FS:1 - Symptomatic but completely ambulatory   There were no vitals filed for this visit. Physical Exam Vitals and nursing note reviewed.  Constitutional:      Appearance: She is well-developed. She is not ill-appearing or toxic-appearing.  HENT:     Head: Normocephalic.     Nose: Nose normal.  Eyes:     Conjunctiva/sclera: Conjunctivae normal.  Neck:     Vascular: No JVD.  Cardiovascular:     Rate and Rhythm: Normal rate and regular rhythm.     Pulses: Normal pulses.     Heart sounds: Normal heart sounds.  Pulmonary:     Effort: Pulmonary effort is normal.     Breath sounds: Normal breath sounds.  Abdominal:     General: Bowel sounds are normal. There is no distension.     Palpations: Abdomen is soft. There is no mass.     Tenderness: There is no abdominal tenderness. There is no right CVA tenderness, left CVA tenderness, guarding or rebound.     Hernia: No hernia is present.  Musculoskeletal:     Cervical back: Normal range of motion.     Comments: No cervical, thoracic, or lumbar spinal tenderness to palpation.  No paraspinal tenderness. No step offs, crepitus or deformity palpated.    Skin:    General: Skin is warm and dry.  Neurological:     Mental Status: She is oriented to person, place, and time.     Comments: Ambulatory with normal gait        LABORATORY DATA: I have reviewed the data as listed    Latest Ref Rng & Units 02/20/2023    1:04 PM 02/18/2023    8:46 AM 02/17/2023    1:30 AM  CBC  WBC 4.0 - 10.5 K/uL 1.7  3.5  4.9   Hemoglobin 12.0 - 15.0 g/dL 13.5   14.4  12.5   Hematocrit 36.0 - 46.0 % 40.9  44.0  37.8   Platelets 150 - 400 K/uL 236  208  208         Latest Ref Rng & Units 02/20/2023    1:04 PM 02/18/2023    8:46 AM 02/17/2023    5:55 AM  CMP  Glucose 70 - 99 mg/dL 90  96  108   BUN 6 - 20 mg/dL 17  18  21    Creatinine 0.44 - 1.00 mg/dL 0.75  0.72  0.81   Sodium 135 - 145 mmol/L 140  140  138   Potassium 3.5 - 5.1 mmol/L 3.6  3.6  2.8   Chloride 98 - 111 mmol/L 104  102  99   CO2 22 - 32 mmol/L 28  30  28    Calcium 8.9 - 10.3 mg/dL 9.6  9.5  8.5   Total Protein 6.5 - 8.1 g/dL 7.8  7.3    Total Bilirubin 0.3 - 1.2 mg/dL 0.3  0.3    Alkaline Phos 38 - 126 U/L 56  52    AST 15 - 41 U/L 17  19    ALT 0 -  44 U/L 28  23         RADIOGRAPHIC STUDIES (from last 24 hours if applicable) I have personally reviewed the radiological images as listed and agreed with the findings in the report. No results found.      Visit Diagnosis: 1. Carcinoma of breast metastatic to liver, unspecified laterality ([place])   2. Right flank discomfort   3. Chemotherapy-induced neutropenia ([place])      Orders Placed This Encounter  Procedures   US Abdomen Limited RUQ (LIVER/GB)    Standing Status:   Future    Standing Expiration Date:   02/20/2024    Order Specific Question:   Reason for Exam (SYMPTOM  OR DIAGNOSIS REQUIRED)    Answer:   RUQ abdominal pain    Order Specific Question:   Preferred imaging location?    Answer:   [facility]   CBC with Differential ([place] Only)    Standing Status:   Future    Number of Occurrences:   1    Standing Expiration Date:   02/20/2024   CMP ([place] only)    Standing Status:   Future    Number of Occurrences:   1    Standing Expiration Date:   02/20/2024    All questions were answered. The patient knows to call the clinic with any problems, questions or concerns. No barriers to learning was detected.  I have spent a total of 20 minutes minutes of face-to-face and  non-face-to-face time, preparing to see the patient, obtaining and/or reviewing separately obtained history, performing a medically appropriate examination, counseling and educating the patient, ordering tests, documenting clinical information in the electronic health record.  Thank you for allowing me to participate in the care of this patient.    Barrie Folk, PA-C Department of Hematology/Oncology [facility] at [facility] Phone: [phone]  [phone]    02/20/2023 4:24 PM

## 2023-02-20 NOTE — Telephone Encounter (Signed)
Received call from pt with compliant of blood from urethra after wiping as well as blood on rectum after bowel movement. Pt denies abdominal pain, burning with urination, or fever at this time.  MD out of office and RN reviewed with [facility] provider. Per provider pt could be experiencing urethra irritation as well as hemorrhoids or anal fissure. Pt educated to increase p.o fluid intake, start OTC stool softer, and to gently wipe after using the restroom. Pt educated to contact our office if symptoms become worse or pt develops fever.  Pt verbalized understanding.

## 2023-02-21 ENCOUNTER — Encounter (HOSPITAL_COMMUNITY): Payer: Self-pay

## 2023-02-21 ENCOUNTER — Telehealth: Payer: Self-pay

## 2023-02-21 ENCOUNTER — Encounter: Payer: Self-pay | Admitting: Hematology and Oncology

## 2023-02-21 ENCOUNTER — Ambulatory Visit (HOSPITAL_COMMUNITY)
Admission: RE | Admit: 2023-02-21 | Discharge: 2023-02-21 | Disposition: A | Discharge status: Home / Self Care | Payer: BC Managed Care – PPO | Source: Ambulatory Visit | Attending: Physician Assistant | Admitting: Physician Assistant

## 2023-02-21 ENCOUNTER — Other Ambulatory Visit: Payer: Self-pay

## 2023-02-21 DIAGNOSIS — C50919 Malignant neoplasm of unspecified site of unspecified female breast: Secondary | ICD-10-CM | POA: Insufficient documentation

## 2023-02-21 DIAGNOSIS — K760 Fatty (change of) liver, not elsewhere classified: Secondary | ICD-10-CM | POA: Diagnosis not present

## 2023-02-21 DIAGNOSIS — R1011 Right upper quadrant pain: Secondary | ICD-10-CM | POA: Diagnosis not present

## 2023-02-21 DIAGNOSIS — C787 Secondary malignant neoplasm of liver and intrahepatic bile duct: Secondary | ICD-10-CM | POA: Diagnosis not present

## 2023-02-21 NOTE — Telephone Encounter (Signed)
Returned Pt's call regarding hematochezia. Pt states she has had frank red blood with bowel movements the past 2 days. Pt denies continuous bleeding or pain. Pt states she feels the urge to poop but is afraid. Per Milinda Cave, PA visit on 02/20/23 Pt likely has hemorrhoids. Discussed importance of adequate hydration and use of Miralax to help soften stool and decrease straining. Pt verbalized understanding.

## 2023-02-21 NOTE — Telephone Encounter (Signed)
This RN called patient to go over [place] results per request of Sherol Dade, PA-C. Patient verbalized understanding that the ultrasound showed a normal gallbladder and the known liver mets. No acute findings at this time. Patient was advised to continue monitoring her symptoms and to notify Dr. Lindi Adie for any changes. Patient verbalized understanding and had no further questions or concerns at this time.

## 2023-02-24 ENCOUNTER — Inpatient Hospital Stay: Payer: BC Managed Care – PPO | Attending: Hematology and Oncology | Admitting: Hematology and Oncology

## 2023-02-24 ENCOUNTER — Other Ambulatory Visit: Payer: Self-pay

## 2023-02-24 ENCOUNTER — Other Ambulatory Visit: Payer: Self-pay | Admitting: Radiology

## 2023-02-24 ENCOUNTER — Inpatient Hospital Stay: Payer: BC Managed Care – PPO

## 2023-02-24 VITALS — BP 143/83 | HR 87 | Temp 97.4°F | Resp 18 | Ht 65.0 in | Wt 169.3 lb

## 2023-02-24 DIAGNOSIS — Z923 Personal history of irradiation: Secondary | ICD-10-CM | POA: Diagnosis not present

## 2023-02-24 DIAGNOSIS — C50919 Malignant neoplasm of unspecified site of unspecified female breast: Secondary | ICD-10-CM | POA: Diagnosis not present

## 2023-02-24 DIAGNOSIS — C9201 Acute myeloblastic leukemia, in remission: Secondary | ICD-10-CM | POA: Diagnosis not present

## 2023-02-24 DIAGNOSIS — Z170 Estrogen receptor positive status [ER+]: Secondary | ICD-10-CM | POA: Diagnosis not present

## 2023-02-24 DIAGNOSIS — C787 Secondary malignant neoplasm of liver and intrahepatic bile duct: Secondary | ICD-10-CM | POA: Diagnosis not present

## 2023-02-24 DIAGNOSIS — Z87891 Personal history of nicotine dependence: Secondary | ICD-10-CM | POA: Insufficient documentation

## 2023-02-24 DIAGNOSIS — Z5112 Encounter for antineoplastic immunotherapy: Secondary | ICD-10-CM | POA: Insufficient documentation

## 2023-02-24 DIAGNOSIS — Z79899 Other long term (current) drug therapy: Secondary | ICD-10-CM | POA: Diagnosis not present

## 2023-02-24 DIAGNOSIS — D0511 Intraductal carcinoma in situ of right breast: Secondary | ICD-10-CM | POA: Diagnosis not present

## 2023-02-24 LAB — CMP (CANCER CENTER ONLY)
ALT: 18 U/L (ref 0–44)
AST: 13 U/L — ABNORMAL LOW (ref 15–41)
Albumin: 4.2 g/dL (ref 3.5–5.0)
Alkaline Phosphatase: 64 U/L (ref 38–126)
Anion gap: 8 (ref 5–15)
BUN: 19 mg/dL (ref 6–20)
CO2: 28 mmol/L (ref 22–32)
Calcium: 9.3 mg/dL (ref 8.9–10.3)
Chloride: 103 mmol/L (ref 98–111)
Creatinine: 0.82 mg/dL (ref 0.44–1.00)
GFR, Estimated: >60 mL/min (ref >60)
Glucose, Bld: 112 mg/dL — ABNORMAL HIGH (ref 70–99)
Potassium: 2.8 mmol/L — ABNORMAL LOW (ref 3.5–5.1)
Sodium: 139 mmol/L (ref 135–145)
Total Bilirubin: 0.2 mg/dL — ABNORMAL LOW (ref 0.3–1.2)
Total Protein: 7.2 g/dL (ref 6.5–8.1)

## 2023-02-24 LAB — CBC WITH DIFFERENTIAL (CANCER CENTER ONLY)
Abs Immature Granulocytes: 0 10*3/uL (ref 0.00–0.07)
Basophils Absolute: 0 10*3/uL (ref 0.0–0.1)
Basophils Relative: 1 %
Eosinophils Absolute: 0 10*3/uL (ref 0.0–0.5)
Eosinophils Relative: 1 %
HCT: 36.4 % (ref 36.0–46.0)
Hemoglobin: 12.8 g/dL (ref 12.0–15.0)
Immature Granulocytes: 0 %
Lymphocytes Relative: 70 %
Lymphs Abs: 1.2 10*3/uL (ref 0.7–4.0)
MCH: 30 pg (ref 26.0–34.0)
MCHC: 35.2 g/dL (ref 30.0–36.0)
MCV: 85.4 fL (ref 80.0–100.0)
Monocytes Absolute: 0.3 10*3/uL (ref 0.1–1.0)
Monocytes Relative: 21 %
Neutro Abs: 0.1 10*3/uL — CL (ref 1.7–7.7)
Neutrophils Relative %: 7 %
Platelet Count: 256 10*3/uL (ref 150–400)
RBC: 4.26 MIL/uL (ref 3.87–5.11)
RDW: 12.5 % (ref 11.5–15.5)
Smear Review: NORMAL
WBC Count: 1.7 10*3/uL — ABNORMAL LOW (ref 4.0–10.5)
nRBC: 0 % (ref 0.0–0.2)

## 2023-02-24 NOTE — Progress Notes (Signed)
CRITICAL VALUE STICKER  CRITICAL VALUE: ANC 0.1  RECEIVER (on-site recipient of call): Richarda Overlie, RN  DATE & TIME NOTIFIED:  02/24/23 at 80  MD NOTIFIED: Nicholas Lose  TIME OF NOTIFICATION: [id number]  RESPONSE:  MD aware, no orders needed

## 2023-02-24 NOTE — Assessment & Plan Note (Signed)
02/11/19: RT lumpectomy: DICS Intermediate grade, Margins Neg, ER 90%, PR 70%; Lt Lumpectomy: UDH status post radiation, could not tolerate tamoxifen recurrence: 08/15/2021:MRI surveillance detected Right breast calcifications 5 cm, Ant biopsy: IG DCIS with necrosis, ER 40%, PR 0%, Posterior biopsy: Intermediate grade DCIS with [facility]  11/29/2021: Right mastectomy: High-grade DCIS with microinvasion, ER 0%, PR 0%, HER2 positive, 0/1 lymph node negative  T1c MIC, N0: Stage Ia   Patient had second opinion at [facility] with Dr. Harden Mo who agreed with her plan of no systemic chemotherapy   Breast cancer surveillance: 1.  Mammogram 08/22/2022: Left breast benign breast density category B  2.  CT CAP 12/09/2022: Multiple liver lesions indicative of cysts and hemangiomas new right hepatic lobe lesion favored to be hemangioma otherwise no evidence of metastatic disease.  Radiology recommended an MRI of the liver. 3.  Liver MRI 01/08/2023: 2 New rim-enhancing lesions 2.3 cm (was 1.6 cm on 12/09/2022), 1.8 cm (measured 1.5 cm), benign hemangioma 2.4 cm and 1.9 cm and 1.3 cm unchanged 4.  PET CT scan 01/22/2023: Liver: 2.5 cm and 2.4 cm lesions ------------------------------------------------------------------------------------------------------------------------------------------------- 01/31/2023: Liver lesion biopsy: Metastatic adenocarcinoma to the liver consistent with breast primary positive for CK7 and GATA3, ER 0%, PR 0%, Ki-67 25%, HER2 3+ positive, [facility] molecular testing is pending   Current treatment: Taxotere Herceptin Perjeta cycle 1 day 10 Chemo toxicities: Flank pain: saw symptom management  Meningioma: Follows with Dr. Mickeal Skinner  Return to clinic for cycle 2

## 2023-02-25 ENCOUNTER — Emergency Department (HOSPITAL_COMMUNITY): Payer: BC Managed Care – PPO

## 2023-02-25 ENCOUNTER — Other Ambulatory Visit: Payer: Self-pay

## 2023-02-25 ENCOUNTER — Ambulatory Visit (HOSPITAL_COMMUNITY)
Admission: RE | Admit: 2023-02-25 | Discharge: 2023-02-25 | Disposition: A | Discharge status: Home / Self Care | Payer: BC Managed Care – PPO | Source: Ambulatory Visit | Attending: Hematology and Oncology | Admitting: Hematology and Oncology

## 2023-02-25 ENCOUNTER — Other Ambulatory Visit: Payer: Self-pay | Admitting: *Deleted

## 2023-02-25 ENCOUNTER — Emergency Department (HOSPITAL_COMMUNITY)
Admission: EM | Admit: 2023-02-25 | Discharge: 2023-02-26 | Disposition: A | Discharge status: Home / Self Care | Payer: BC Managed Care – PPO | Attending: Emergency Medicine | Admitting: Emergency Medicine

## 2023-02-25 DIAGNOSIS — C50911 Malignant neoplasm of unspecified site of right female breast: Secondary | ICD-10-CM | POA: Insufficient documentation

## 2023-02-25 DIAGNOSIS — D701 Agranulocytosis secondary to cancer chemotherapy: Secondary | ICD-10-CM | POA: Insufficient documentation

## 2023-02-25 DIAGNOSIS — C50919 Malignant neoplasm of unspecified site of unspecified female breast: Secondary | ICD-10-CM

## 2023-02-25 DIAGNOSIS — Z452 Encounter for adjustment and management of vascular access device: Secondary | ICD-10-CM | POA: Diagnosis not present

## 2023-02-25 DIAGNOSIS — R079 Chest pain, unspecified: Secondary | ICD-10-CM | POA: Diagnosis not present

## 2023-02-25 DIAGNOSIS — K769 Liver disease, unspecified: Secondary | ICD-10-CM

## 2023-02-25 DIAGNOSIS — G8918 Other acute postprocedural pain: Secondary | ICD-10-CM

## 2023-02-25 DIAGNOSIS — T451X5A Adverse effect of antineoplastic and immunosuppressive drugs, initial encounter: Secondary | ICD-10-CM

## 2023-02-25 DIAGNOSIS — C787 Secondary malignant neoplasm of liver and intrahepatic bile duct: Secondary | ICD-10-CM | POA: Insufficient documentation

## 2023-02-25 HISTORY — PX: IR IMAGING GUIDED PORT INSERTION: IMG5740

## 2023-02-25 LAB — COMPREHENSIVE METABOLIC PANEL
ALT: 21 U/L (ref 0–44)
AST: 17 U/L (ref 15–41)
Albumin: 4.2 g/dL (ref 3.5–5.0)
Alkaline Phosphatase: 50 U/L (ref 38–126)
Anion gap: 9 (ref 5–15)
BUN: 16 mg/dL (ref 6–20)
CO2: 28 mmol/L (ref 22–32)
Calcium: 8.7 mg/dL — ABNORMAL LOW (ref 8.9–10.3)
Chloride: 100 mmol/L (ref 98–111)
Creatinine, Ser: 0.74 mg/dL (ref 0.44–1.00)
GFR, Estimated: >60 mL/min (ref >60)
Glucose, Bld: 95 mg/dL (ref 70–99)
Potassium: 2.8 mmol/L — ABNORMAL LOW (ref 3.5–5.1)
Sodium: 137 mmol/L (ref 135–145)
Total Bilirubin: 0.4 mg/dL (ref 0.3–1.2)
Total Protein: 7.3 g/dL (ref 6.5–8.1)

## 2023-02-25 MED ORDER — ONDANSETRON HCL 4 MG/2ML IJ SOLN
INTRAMUSCULAR | Status: AC
  Filled 2023-02-25: qty 2

## 2023-02-25 MED ORDER — FENTANYL CITRATE (PF) 100 MCG/2ML IJ SOLN
INTRAMUSCULAR | Status: AC | PRN
  Administered 2023-02-25 (×2): 50 ug via INTRAVENOUS

## 2023-02-25 MED ORDER — LIDOCAINE-EPINEPHRINE 1 %-1:100000 IJ SOLN
INTRAMUSCULAR | Status: AC
  Administered 2023-02-25: 20 mL via INTRADERMAL
  Filled 2023-02-25: qty 1

## 2023-02-25 MED ORDER — SODIUM CHLORIDE 0.9 % IV SOLN: INTRAVENOUS | Status: DC

## 2023-02-25 MED ORDER — POTASSIUM CHLORIDE CRYS ER 20 MEQ PO TBCR: 20.0000 meq | EXTENDED_RELEASE_TABLET | Freq: Every day | ORAL | 3 refills | Status: DC

## 2023-02-25 MED ORDER — ONDANSETRON HCL 4 MG/2ML IJ SOLN: 4.0000 mg | Freq: Four times a day (QID) | INTRAMUSCULAR | Status: DC | PRN

## 2023-02-25 MED ORDER — ONDANSETRON HCL 4 MG/2ML IJ SOLN
INTRAMUSCULAR | Status: AC | PRN
  Administered 2023-02-25: 4 mg via INTRAVENOUS

## 2023-02-25 MED ORDER — MORPHINE SULFATE (PF) 4 MG/ML IV SOLN
4.0000 mg | Freq: Once | INTRAVENOUS | Status: AC
  Administered 2023-02-26: 4 mg via INTRAVENOUS
  Filled 2023-02-25: qty 1

## 2023-02-25 MED ORDER — MIDAZOLAM HCL 2 MG/2ML IJ SOLN
INTRAMUSCULAR | Status: AC
  Filled 2023-02-25: qty 2

## 2023-02-25 MED ORDER — HEPARIN SOD (PORK) LOCK FLUSH 100 UNIT/ML IV SOLN
INTRAVENOUS | Status: AC
  Administered 2023-02-25: 500 [IU] via INTRAVENOUS
  Filled 2023-02-25: qty 5

## 2023-02-25 MED ORDER — FENTANYL CITRATE (PF) 100 MCG/2ML IJ SOLN
INTRAMUSCULAR | Status: AC
  Filled 2023-02-25: qty 2

## 2023-02-25 MED ORDER — POTASSIUM CHLORIDE CRYS ER 20 MEQ PO TBCR
40.0000 meq | EXTENDED_RELEASE_TABLET | Freq: Once | ORAL | Status: AC
  Administered 2023-02-25: 40 meq via ORAL
  Filled 2023-02-25: qty 2

## 2023-02-25 MED ORDER — MIDAZOLAM HCL 2 MG/2ML IJ SOLN
INTRAMUSCULAR | Status: AC | PRN
  Administered 2023-02-25 (×2): 1 mg via INTRAVENOUS

## 2023-02-25 NOTE — H&P (Signed)
Referring Physician(s): Nicholas Lose  Supervising Physician: Michaelle Birks  Patient Status:  [facility]  Chief Complaint: "I'm getting a port a cath"   Subjective: Patient familiar to IR service from liver lesion biopsy on 01/31/2023.  She has a history of AML in 1999 and now with metastatic right breast carcinoma to the liver.  She has undergone prior mastectomy and chemoradiation.  She is scheduled today for Port-A-Cath placement to assist with treatment.  She currently denies fever, chest pain, dyspnea, cough, back pain, nausea, vomiting.  She does have a mild headache, some occasional right-sided abdominal discomfort, occasional blood-tinged stool and occasional nosebleed.  Additional medical history as below.  Past Medical History:  Diagnosis Date   Breast cancer ([place]) 2020   Right Breast Cancer   Endometriosis    Fibroid    Hx of radiation therapy 06/2019   Leukemia ([place])    20 years ago   Personal history of radiation therapy 2020   Right Breast Cancer   PONV (postoperative nausea and vomiting)    pt states she vomits after colonoscopies   Past Surgical History:  Procedure Laterality Date   BREAST EXCISIONAL BIOPSY Left 02/11/2019   [facility]   BREAST LUMPECTOMY Right 02/11/2019   BREAST LUMPECTOMY WITH RADIOACTIVE SEED LOCALIZATION Bilateral 02/11/2019   Procedure: BILATERAL BREAST LUMPECTOMIES WITH BILATERAL RADIOACTIVE SEED LOCALIZATION;  Surgeon: Donnie Mesa, MD;  Location: [facility];  Service: General;  Laterality: Bilateral;   MASTECTOMY Right 11/2021      Allergies: Tamoxifen citrate  Medications: Prior to Admission medications   Medication Sig Start Date End Date Taking? Authorizing Provider  ciprofloxacin (CIPRO) 500 MG tablet Take 1 tablet (500 mg total) by mouth 2 (two) times daily for 7 days. 02/18/23 02/25/23 Yes Walisiewicz, Kaitlyn E, PA-C  hydrochlorothiazide (HYDRODIURIL) 25 MG tablet TAKE 1 TABLET BY MOUTH EVERY DAY IN THE MORNING FOR 30  DAYS ONCE A DAY 11/04/22  Yes [provider]  VITAMIN D PO Take 2,000 mg by mouth daily.   Yes [provider]  ALPRAZolam (XANAX) 0.25 MG tablet Take 0.25 mg by mouth daily as needed. 01/14/23   [provider]  magic mouthwash w/lidocaine SOLN Take 5 mLs by mouth 4 (four) times daily as needed for mouth pain. 02/17/23   Nicholas Lose, MD  ondansetron (ZOFRAN) 8 MG tablet Take 1 tablet (8 mg total) by mouth every 8 (eight) hours as needed for nausea or vomiting. 02/10/23   Nicholas Lose, MD  potassium chloride SA (KLOR-CON M) 20 MEQ tablet Take 2 tablets (40 mEq total) by mouth 2 (two) times daily for 2 days. 02/17/23 02/19/23  Molpus, John, MD     Vital Signs: Ht 5\' 5"  (1.651 m)   Wt 169 lb 1.5 oz (76.7 kg)   LMP 02/10/2023 (Approximate)   BMI 28.14 kg/m   Physical Exam: Awake, alert.  Chest clear to auscultation bilaterally.  Heart with regular rate and rhythm.  Abdomen soft, positive bowel sounds, some minimal right-sided abdominal tenderness to palpation.  No lower extremity edema.   Code Status: FULL CODE  Imaging: No results found.  Labs:  CBC: Recent Labs    02/17/23 0130 02/18/23 0846 02/20/23 1304 02/24/23 1059  WBC 4.9 3.5* 1.7* 1.7*  HGB 12.5 14.4 13.5 12.8  HCT 37.8 44.0 40.9 36.4  PLT 208 208 236 256    COAGS: Recent Labs    01/31/23 0630  INR 1.0    BMP: Recent Labs  02/17/23 0555 02/18/23 0846 02/20/23 1304 02/24/23 1059  NA 138 140 140 139  K 2.8* 3.6 3.6 2.8*  CL 99 102 104 103  CO2 28 30 28 28   GLUCOSE 108* 96 90 112*  BUN 21* 18 17 19   CALCIUM 8.5* 9.5 9.6 9.3  CREATININE 0.81 0.72 0.75 0.82  GFRNONAA >60 >60 >60 >60    LIVER FUNCTION TESTS: Recent Labs    02/17/23 0130 02/18/23 0846 02/20/23 1304 02/24/23 1059  BILITOT 0.5 0.3 0.3 0.2*  AST 16 19 17  13*  ALT 15 23 28 18   ALKPHOS 47 52 56 64  PROT 6.4* 7.3 7.8 7.2  ALBUMIN 3.6 4.4 4.6 4.2    Assessment and Plan: 47 year old female with prior  history of AML in 1999 and now with metastatic right breast carcinoma to liver.  She presents today for Port-A-Cath placement to assist with treatment.Risks and benefits of image guided port-a-catheter placement was discussed with the patient/sig other including, but not limited to bleeding, infection, pneumothorax, or fibrin sheath development and need for additional procedures.  All of the patient's questions were answered, patient is agreeable to proceed. Consent signed and in chart.    Electronically Signed: D. Rowe Robert, PA-C 02/25/2023, 12:32 PM   I spent a total of 20 minutes at the the patient's bedside AND on the patient's hospital floor or unit, greater than 50% of which was counseling/coordinating care for Port-A-Cath placement

## 2023-02-25 NOTE — Discharge Instructions (Signed)
Implanted Port Insertion, Care After  The following information offers guidance on how to care for yourself after your procedure. Your health care provider may also give you more specific instructions. If you have problems or questions, contact your health care provider.  What can I expect after the procedure? After the procedure, it is common to have: Discomfort at the port insertion site. Bruising on the skin over the port. This should improve over 3-4 days.   Urgent needs - Interventional Radiology, clinic [phone] (mon-fri 8-5).   Wound - May remove dressing and shower in 24 to 48 hours.  Keep site clean and dry.  Replace with bandaid as needed.  Do not submerge in tub or water until site healing well. If closed with glue, glue will flake off on its own.   If ordered by your provider, may start Emla cream in 2 weeks or after incision is healed. Port is ready for use immediately.   After completion of treatment, your provider should have you set up for monthly port flushes.  Follow these instructions at home: Port care After your port is placed, you will get a manufacturer's information card. The card has information about your port. Keep this card with you at all times. Take care of the port as told by your health care provider. Ask your health care provider if you or a family member can get training for taking care of the port at home. A home health care nurse will be be available to help care for the port. Make sure to remember what type of port you have. Incision care     Follow instructions from your health care provider about how to take care of your port insertion site. Make sure you: Wash your hands with soap and water for at least 20 seconds before and after you change your bandage (dressing). If soap and water are not available, use hand sanitizer. Change your dressing as told by your health care provider. Leave stitches (sutures), skin glue, or adhesive strips in place.  These skin closures may need to stay in place for 2 weeks or longer. If adhesive strip edges start to loosen and curl up, you may trim the loose edges. Do not remove adhesive strips completely unless your health care provider tells you to do that. Check your port insertion site every day for signs of infection. Check for: Redness, swelling, or pain. Fluid or blood. Warmth. Pus or a bad smell. Activity Return to your normal activities as told by your health care provider. Ask your health care provider what activities are safe for you. You may have to avoid lifting. Ask your health care provider how much you can safely lift. General instructions Take over-the-counter and prescription medicines only as told by your health care provider. Do not take baths, swim, or use a hot tub until your health care provider approves. Ask your health care provider if you may take showers. You may only be allowed to take sponge baths. If you were given a sedative during the procedure, it can affect you for several hours. Do not drive or operate machinery until your health care provider says that it is safe. Wear a medical alert bracelet in case of an emergency. This will tell any health care providers that you have a port. Keep all follow-up visits. This is important. Contact a health care provider if: You cannot flush your port with saline as directed, or you cannot draw blood from the port. You have a   fever or chills. You have redness, swelling, or pain around your port insertion site. You have fluid or blood coming from your port insertion site. Your port insertion site feels warm to the touch. You have pus or a bad smell coming from the port insertion site. Get help right away if: You have chest pain or shortness of breath. You have bleeding from your port that you cannot control. These symptoms may be an emergency. Get help right away. Call 911. Do not wait to see if the symptoms will go away. Do not  drive yourself to the hospital. Summary Take care of the port as told by your health care provider. Keep the manufacturer's information card with you at all times. Change your dressing as told by your health care provider. Contact a health care provider if you have a fever or chills or if you have redness, swelling, or pain around your port insertion site. Keep all follow-up visits. This information is not intended to replace advice given to you by your health care provider. Make sure you discuss any questions you have with your health care provider. Document Revised: 05/15/2021 Document Reviewed: 05/15/2021 Elsevier Patient Education  2023 Elsevier Inc.    Moderate Conscious Sedation  Adult  Care After (English)  After the procedure, it is common to have: Sleepiness for a few hours. Impaired judgment for a few hours. Trouble with balance. Nausea or vomiting if you eat too soon. Follow these instructions at home: For the time period you were told by your health care provider:  Rest. Do not participate in activities where you could fall or become injured. Do not drive or use machinery. Do not drink alcohol. Do not take sleeping pills or medicines that cause drowsiness. Do not make important decisions or sign legal documents. Do not take care of children on your own. Eating and drinking Follow instructions from your health care provider about what you may eat and drink. Drink enough fluid to keep your urine pale yellow. If you vomit: Drink clear fluids slowly and in small amounts as you are able. Clear fluids include water, ice chips, low-calorie sports drinks, and fruit juice that has water added to it (diluted fruit juice). Eat light and bland foods in small amounts as you are able. These foods include bananas, applesauce, rice, lean meats, toast, and crackers. General instructions Take over-the-counter and prescription medicines only as told by your health care provider. Have a  responsible adult stay with you for the time you are told. Do not use any products that contain nicotine or tobacco. These products include cigarettes, chewing tobacco, and vaping devices, such as e-cigarettes. If you need help quitting, ask your health care provider. Return to your normal activities as told by your health care provider. Ask your health care provider what activities are safe for you. Your health care provider may give you more instructions. Make sure you know what you can and cannot do. Contact a health care provider if: You are still sleepy or having trouble with balance after 24 hours. You feel light-headed. You vomit every time you eat or drink. You get a rash. You have a fever. You have redness or swelling around the IV site. Get help right away if: You have trouble breathing. You start to feel confused at home. These symptoms may be an emergency. Get help right away. Call 911. Do not wait to see if the symptoms will go away. Do not drive yourself to the hospital. This information is   not intended to replace advice given to you by your health care provider. Make sure you discuss any questions you have with your health care provider.     

## 2023-02-25 NOTE — ED Triage Notes (Signed)
Pt had port placed to L chest earlier today. Having burning pain where port is placed with pain radiating to her neck. Redness noted around dressing site.

## 2023-02-25 NOTE — Progress Notes (Signed)
Received call from pt wanting to review recent potassium lab with MD showing K to be 2.8.  Pt denies diarrhea at this time.  Per MD hypokalemia r/t hydrochlorothiazide and advised pt to f/u with PCP for possible medication change.  Verbal orders also received from MD for pt to start Potassium Chloride 20 mEq p.o daily.  Prescription sent to pharmacy on file, pt educated and verbalized understanding.

## 2023-02-25 NOTE — Procedures (Signed)
Vascular and Interventional Radiology Procedure Note  Patient: Diahn Lohan DOB: 01-Apr-1976 Medical Record Number: [id number] Note Date/Time: 02/25/23 2:09 PM   Performing Physician: Michaelle Birks, MD Assistant(s): None  Diagnosis: Breast cancer  Procedure: PORT PLACEMENT  Anesthesia: Conscious Sedation Complications: None Estimated Blood Loss: Minimal  Findings:  Successful left-sided port placement, with the tip of the catheter in the proximal right atrium.  Plan: Catheter ready for use.  See detailed procedure note with images in PACS. The patient tolerated the procedure well without incident or complication and was returned to Recovery in stable condition.    Michaelle Birks, MD [facility] Interventional [facility]iology [facility] Radiology   Pager. [place]

## 2023-02-25 NOTE — ED Provider Notes (Signed)
[facility] Provider Note   CSN: [id number] Arrival date & time: 02/25/23  2234     History {Add pertinent medical, surgical, social history, OB history to HPI:1} Chief Complaint  Patient presents with   Post-op Problem    Joann Meadows is a 47 y.o. female.  HPI Joann Meadows is a 47 y.o. female who presents to the Emergency Department complaining of *** Today.  Home 330.  Swelling is increasing and burning pain.  No fever, sob.  burning    Home Medications Prior to Admission medications   Medication Sig Start Date End Date Taking? Authorizing Provider  cholecalciferol (VITAMIN D3) 25 MCG (1000 UNIT) tablet Take 2,000 Units by mouth daily.    [provider]  ciprofloxacin (CIPRO) 500 MG tablet Take 1 tablet (500 mg total) by mouth 2 (two) times daily for 7 days. 02/18/23 02/25/23  Walisiewicz, Verline Lema E, PA-C  hydrochlorothiazide (HYDRODIURIL) 25 MG tablet TAKE 1 TABLET BY MOUTH EVERY DAY IN THE MORNING FOR 30 DAYS ONCE A DAY 11/04/22   [provider]  magic mouthwash w/lidocaine SOLN Take 5 mLs by mouth 4 (four) times daily as needed for mouth pain. 02/17/23   Nicholas Lose, MD  ondansetron (ZOFRAN) 8 MG tablet Take 1 tablet (8 mg total) by mouth every 8 (eight) hours as needed for nausea or vomiting. 02/10/23   Nicholas Lose, MD  potassium chloride SA (KLOR-CON M) 20 MEQ tablet Take 1 tablet (20 mEq total) by mouth daily. 02/25/23   Nicholas Lose, MD      Allergies    Tamoxifen citrate    Review of Systems   Review of Systems  Physical Exam Updated Vital Signs BP (!) 150/108 (BP Location: Left Arm)   Pulse 81   Temp 98.2 F (36.8 C) (Oral)   Resp 17   LMP 02/10/2023 (Approximate)   SpO2 99%  Physical Exam  ED Results / Procedures / Treatments   Labs (all labs ordered are listed, but only abnormal results are displayed) Labs Reviewed - No data to display  EKG None  Radiology DG Chest 2  View  Result Date: 02/25/2023 CLINICAL DATA:  Port catheter placement, chest pain EXAM: CHEST - 2 VIEW COMPARISON:  10/10/2021 FINDINGS: Lungs are clear. No pneumothorax or pleural effusion. Left internal jugular chest port tip noted within the superior right atrium. Cardiac size within normal limits. Pulmonary vascularity is normal. Numerous surgical clips are seen within the right breast. No acute bone abnormality. IMPRESSION: 1. Left internal jugular chest port in appropriate position. No pneumothorax. Electronically Signed   By: Fidela Salisbury M.D.   On: 02/25/2023 23:21   IR IMAGING GUIDED PORT INSERTION  Result Date: 02/25/2023 INDICATION: RIGHT breast cancer. EXAM: IMPLANTED PORT A CATH PLACEMENT WITH ULTRASOUND AND FLUOROSCOPIC GUIDANCE MEDICATIONS: Zofran 4 mg IV ANESTHESIA/SEDATION: Moderate (conscious) sedation was employed during this procedure. A total of Versed 2 mg and Fentanyl 100 mcg was administered intravenously. Moderate Sedation Time: 24 minutes. The patient's level of consciousness and vital signs were monitored continuously by radiology nursing throughout the procedure under my direct supervision. FLUOROSCOPY TIME:  Fluoroscopic dose; 0 mGy COMPLICATIONS: None immediate. PROCEDURE: The procedure, risks, benefits, and alternatives were explained to the patient. Questions regarding the procedure were encouraged and answered. The patient understands and consents to the procedure. The LEFT neck and chest were prepped with chlorhexidine in a sterile fashion, and a sterile drape was applied covering the operative field. Maximum barrier sterile  technique with sterile gowns and gloves were used for the procedure. A timeout was performed prior to the initiation of the procedure. Local anesthesia was provided with 1% lidocaine with epinephrine. After creating a small venotomy incision, a micropuncture kit was utilized to access the internal jugular vein under direct, real-time ultrasound guidance.  Ultrasound image documentation was performed. The microwire was kinked to measure appropriate catheter length. A subcutaneous port pocket was then created along the upper chest wall utilizing a combination of sharp and blunt dissection. The pocket was irrigated with sterile saline. A single lumen ISP power injectable port was chosen for placement. The 8 Fr catheter was tunneled from the port pocket site to the venotomy incision. The port was placed in the pocket. The external catheter was trimmed to appropriate length. At the venotomy, an 8 Fr peel-away sheath was placed over a guidewire under fluoroscopic guidance. The catheter was then placed through the sheath and the sheath was removed. Final catheter positioning was confirmed and documented with a fluoroscopic spot radiograph. The port was accessed with a Huber needle, aspirated and flushed with heparinized saline. The port pocket incision was closed with interrupted 3-0 Vicryl suture then Dermabond was applied, including at the venotomy incision. Dressings were placed. The patient tolerated the procedure well without immediate post procedural complication. IMPRESSION: Successful placement of a LEFT internal jugular approach power injectable Port-A-Cath. The tip of the catheter is positioned within the proximal RIGHT atrium. The catheter is ready for immediate use. Michaelle Birks, MD Vascular and Interventional Radiology [facility] Radiology Electronically Signed   By: Michaelle Birks M.D.   On: 02/25/2023 15:08    Procedures Procedures  {Document cardiac monitor, telemetry assessment procedure when appropriate:1}  Medications Ordered in ED Medications - No data to display  ED Course/ Medical Decision Making/ A&P   {   Click here for ABCD2, HEART and other calculatorsREFRESH Note before signing :1}                          Medical Decision Making Amount and/or Complexity of Data Reviewed Radiology: ordered.   ***  {Document critical  care time when appropriate:1} {Document review of labs and clinical decision tools ie heart score, Chads2Vasc2 etc:1}  {Document your independent review of radiology images, and any outside records:1} {Document your discussion with family members, caretakers, and with consultants:1} {Document social determinants of health affecting pt's care:1} {Document your decision making why or why not admission, treatments were needed:1} Final Clinical Impression(s) / ED Diagnoses Final diagnoses:  None    Rx / DC Orders ED Discharge Orders     None

## 2023-02-26 ENCOUNTER — Other Ambulatory Visit (HOSPITAL_COMMUNITY): Payer: Self-pay | Admitting: Interventional Radiology

## 2023-02-26 ENCOUNTER — Telehealth: Payer: Self-pay | Admitting: *Deleted

## 2023-02-26 DIAGNOSIS — Z95828 Presence of other vascular implants and grafts: Secondary | ICD-10-CM

## 2023-02-26 LAB — CBC WITH DIFFERENTIAL/PLATELET
Abs Immature Granulocytes: 0 10*3/uL (ref 0.00–0.07)
Basophils Absolute: 0 10*3/uL (ref 0.0–0.1)
Basophils Relative: 1 %
Eosinophils Absolute: 0 10*3/uL (ref 0.0–0.5)
Eosinophils Relative: 2 %
HCT: 36.9 % (ref 36.0–46.0)
Hemoglobin: 12.2 g/dL (ref 12.0–15.0)
Immature Granulocytes: 0 %
Lymphocytes Relative: 65 %
Lymphs Abs: 1.8 10*3/uL (ref 0.7–4.0)
MCH: 29.4 pg (ref 26.0–34.0)
MCHC: 33.1 g/dL (ref 30.0–36.0)
MCV: 88.9 fL (ref 80.0–100.0)
Monocytes Absolute: 0.7 10*3/uL (ref 0.1–1.0)
Monocytes Relative: 25 %
Neutro Abs: 0.2 10*3/uL — CL (ref 1.7–7.7)
Neutrophils Relative %: 7 %
Platelets: 319 10*3/uL (ref 150–400)
RBC: 4.15 MIL/uL (ref 3.87–5.11)
RDW: 12.7 % (ref 11.5–15.5)
WBC: 2.7 10*3/uL — ABNORMAL LOW (ref 4.0–10.5)
nRBC: 0 % (ref 0.0–0.2)

## 2023-02-26 LAB — COMPREHENSIVE METABOLIC PANEL
ALT: 21 U/L (ref 0–44)
AST: 23 U/L (ref 15–41)
Albumin: 3.6 g/dL (ref 3.5–5.0)
Alkaline Phosphatase: 55 U/L (ref 38–126)
Anion gap: 8 (ref 5–15)
BUN: 15 mg/dL (ref 6–20)
CO2: 26 mmol/L (ref 22–32)
Calcium: 8.5 mg/dL — ABNORMAL LOW (ref 8.9–10.3)
Chloride: 104 mmol/L (ref 98–111)
Creatinine, Ser: 0.61 mg/dL (ref 0.44–1.00)
GFR, Estimated: >60 mL/min (ref >60)
Glucose, Bld: 98 mg/dL (ref 70–99)
Potassium: 3.5 mmol/L (ref 3.5–5.1)
Sodium: 138 mmol/L (ref 135–145)
Total Bilirubin: 1 mg/dL (ref 0.3–1.2)
Total Protein: 6.6 g/dL (ref 6.5–8.1)

## 2023-02-26 MED ORDER — CEPHALEXIN 500 MG PO CAPS: 500.0000 mg | ORAL_CAPSULE | Freq: Two times a day (BID) | ORAL | 0 refills | Status: DC

## 2023-02-26 MED ORDER — OXYCODONE-ACETAMINOPHEN 5-325 MG PO TABS: 1.0000 | ORAL_TABLET | Freq: Four times a day (QID) | ORAL | 0 refills | Status: DC | PRN

## 2023-02-26 MED ORDER — SODIUM CHLORIDE 0.9 % IV SOLN
2.0000 g | Freq: Once | INTRAVENOUS | Status: AC
  Administered 2023-02-26: 2 g via INTRAVENOUS
  Filled 2023-02-26: qty 12.5

## 2023-02-26 MED ORDER — MORPHINE SULFATE (PF) 4 MG/ML IV SOLN
4.0000 mg | Freq: Once | INTRAVENOUS | Status: AC
  Administered 2023-02-26: 4 mg via INTRAVENOUS
  Filled 2023-02-26: qty 1

## 2023-02-26 NOTE — Telephone Encounter (Signed)
Received call from pt stating she was seen in the ED yesterday evening for severe pain, redness, and swelling at port a cath insertion site (procedure done earlier in the day).  Pt states she was discharged home with pain medication and abx.  Pt requesting advice from MD if okay to proceed with abx.  Per MD, due to pt neutropenia, pt needing to continue course of abx.  Pt also requesting to speak with IR due to the increased pain and wanting a better explanation of the surgery.  RN placed call to [place], [place] with IR requesting him to look over pt ED visit and contact pt.

## 2023-02-27 ENCOUNTER — Ambulatory Visit (HOSPITAL_COMMUNITY)
Admission: RE | Admit: 2023-02-27 | Discharge: 2023-02-27 | Disposition: A | Discharge status: Home / Self Care | Payer: BC Managed Care – PPO | Source: Ambulatory Visit | Attending: Interventional Radiology | Admitting: Interventional Radiology

## 2023-02-27 ENCOUNTER — Telehealth: Payer: Self-pay | Admitting: *Deleted

## 2023-02-27 DIAGNOSIS — Z95828 Presence of other vascular implants and grafts: Secondary | ICD-10-CM

## 2023-02-27 DIAGNOSIS — R233 Spontaneous ecchymoses: Secondary | ICD-10-CM | POA: Diagnosis not present

## 2023-02-27 HISTORY — PX: IR RADIOLOGIST EVAL & MGMT: IMG5224

## 2023-02-27 NOTE — Progress Notes (Signed)
Ms Joann Meadows presented to [facility] IR for evaluation of port site following port placement on 02/25/23 with Dr Maryelizabeth Kaufmann. Patient presented to [facility] ED on 02/25/23 following her port placement for concerns of post-procedural pain, swelling, and redness at her port site. Patient was started on empiric antibiotics Keflex 500mg  BID by ED provider. On presentation today, patient had HR of 84, BP of 153/98, and SpO2 of 97%. Patient was examined in conjunction with Dr Maryelizabeth Kaufmann. On today's visit, she denies fever and chills and endorses tenderness at port site. Her port site has mild ecchymosis surrounding the port placement site. The incision site was clean, dressed appropriately, and without fluctuance, significant tenderness, drainage, or induration. Patient's heart with normal S1/S2, no murmurs, rubs, or gallops. Lungs clear to auscultation bilaterally. Both sides of the incision site were well approximated with each other and dried surgical glue is evident over the surface of the incision. There is no concern for active infection at this time and this was shared with the patient. Patient instructed to continue taking her antibiotics as directed, but that concern for infection is low especially given the short interval between port placement and development of symptoms several hours later. Patient given strict return precautions in the event she develops fevers, chills, drainage from the site, or other concern for infection. Lura Em, PA-C 02/27/2023 8:57 AM

## 2023-02-27 NOTE — Telephone Encounter (Signed)
Received call from pt with complaint of rash at the surgical site for port a cath after removing tegaderm.  Pt requesting advice from MD if okay to shower.  Pt port insertion 4/2 and can proceed with shower.  RN educated pt on gently washing over site and to take OTC benadryl to help with reaction.  RN also educated pt that in the future, she will need to ask for a sensitive dressing when having port accessed.  Note also placed in chart for future appts. Pt verbalized understanding.

## 2023-03-03 DIAGNOSIS — C799 Secondary malignant neoplasm of unspecified site: Secondary | ICD-10-CM | POA: Diagnosis not present

## 2023-03-03 DIAGNOSIS — C50911 Malignant neoplasm of unspecified site of right female breast: Secondary | ICD-10-CM | POA: Diagnosis not present

## 2023-03-03 DIAGNOSIS — R4589 Other symptoms and signs involving emotional state: Secondary | ICD-10-CM | POA: Diagnosis not present

## 2023-03-03 DIAGNOSIS — Z79899 Other long term (current) drug therapy: Secondary | ICD-10-CM | POA: Diagnosis not present

## 2023-03-03 DIAGNOSIS — Z9011 Acquired absence of right breast and nipple: Secondary | ICD-10-CM | POA: Diagnosis not present

## 2023-03-03 DIAGNOSIS — Z87891 Personal history of nicotine dependence: Secondary | ICD-10-CM | POA: Diagnosis not present

## 2023-03-03 DIAGNOSIS — F419 Anxiety disorder, unspecified: Secondary | ICD-10-CM | POA: Diagnosis not present

## 2023-03-03 DIAGNOSIS — Z08 Encounter for follow-up examination after completed treatment for malignant neoplasm: Secondary | ICD-10-CM | POA: Diagnosis not present

## 2023-03-03 DIAGNOSIS — Z853 Personal history of malignant neoplasm of breast: Secondary | ICD-10-CM | POA: Diagnosis not present

## 2023-03-04 ENCOUNTER — Other Ambulatory Visit: Payer: Self-pay | Admitting: *Deleted

## 2023-03-04 ENCOUNTER — Telehealth: Payer: Self-pay | Admitting: *Deleted

## 2023-03-04 NOTE — Telephone Encounter (Signed)
Received call from pt stating PCP has started her on Benicar for BP control.  Pt requesting advice from MD if oral potassium supplement is okay to take with Benicar.  Per MD okay to proceed, we will continue to monitor lab work.  Pt also stating she is experiencing up to 5 episodes of diarrhea a day. Pt states she is able to tolerate p.o liquid and foods and is not feeling weak.  Per MD pt needing to take 1 dose of OTC imodium with next diarrhea episode. Pt educated to contact our office if symptoms do not improve.  Pt verbalized understanding.

## 2023-03-06 MED FILL — Dexamethasone Sodium Phosphate Inj 100 MG/10ML: INTRAMUSCULAR | Qty: 1 | Status: AC

## 2023-03-06 NOTE — Progress Notes (Signed)
Patient Care Team: [name], MD as PCP - General (Family Medicine) Donnelly Angelica, RN as Oncology Nurse Navigator Pershing Proud, RN as Oncology Nurse Navigator Serena Croissant, MD as Consulting Physician (Hematology and Oncology) Default, Provider, MD as Technician  DIAGNOSIS:  Encounter Diagnosis  Name Primary?   Carcinoma of breast metastatic to liver, unspecified laterality Yes    SUMMARY OF ONCOLOGIC HISTORY: Oncology History  Breast cancer metastasized to liver  01/25/2019 Initial Diagnosis   Screening mammogram showed bilateral calcifications right breast 5 mm, pleomorphic.  Left breast 1.6 cm punctate, smaller group of calcifications spanning 3 mm.  Right lower outer quadrant biopsy revealed low-grade DCIS ER 90%, PR 70%; biopsy of left breast calcifications ALH and PASH   01/31/2019 Genetic Testing   ATM c.8495G>A and RECQL4 c.2587G>A VUS identified on the 9-gene STAT panel and Multi-cancer panel through [facility].  The STAT Breast cancer panel offered by [facility] includes sequencing and rearrangement analysis for the following 9 genes:  ATM, BRCA1, BRCA2, CDH1, CHEK2, PALB2, PTEN, STK11 and TP53.   The Multi-Gene Panel offered by [facility] includes sequencing and/or deletion duplication testing of the following 84 genes: AIP, ALK, APC, ATM, AXIN2,BAP1,  BARD1, BLM, BMPR1A, BRCA1, BRCA2, BRIP1, CASR, CDC73, CDH1, CDK4, CDKN1B, CDKN1C, CDKN2A (p14ARF), CDKN2A (p16INK4a), CEBPA, CHEK2, CTNNA1, DICER1, DIS3L2, EGFR (c.2369C>T, p.Thr790Met variant only), EPCAM (Deletion/duplication testing only), FH, FLCN, GATA2, GPC3, GREM1 (Promoter region deletion/duplication testing only), HOXB13 (c.251G>A, p.Gly84Glu), HRAS, KIT, MAX, MEN1, MET, MITF (c.952G>A, p.Glu318Lys variant only), MLH1, MSH2, MSH3, MSH6, MUTYH, NBN, NF1, NF2, NTHL1, PALB2, PDGFRA, PHOX2B, PMS2, POLD1, POLE, POT1, PRKAR1A, PTCH1, PTEN, RAD50, RAD51C, RAD51D, RB1, RECQL4, RET, RUNX1, SDHAF2, SDHA (sequence changes only), SDHB,  SDHC, SDHD, SMAD4, SMARCA4, SMARCB1, SMARCE1, STK11, SUFU, TERC, TERT, TMEM127, TP53, TSC1, TSC2, VHL, WRN and WT1.  he report date is February 02, 2019.   02/11/2019 Surgery   RT lumpectomy: DICS Intermediate grade, Margins Neg, ER 90%, PR 70%; Lt Lumpectomy: UDH   02/26/2019 -  Anti-estrogen oral therapy   Tamoxifen  daily, stopped because of depression and facial numbness   05/20/2019 - 06/17/2019 Radiation Therapy   Adjuvant XRT   08/15/2021 Relapse/Recurrence   MRI surveillance detected Right breast calcifications 5 cm, Ant biopsy: IG DCIS with necrosis, ER 40%, PR 0%, Posterior biopsy: Intermediate grade DCIS with [facility]   08/19/2022 Cancer Staging   Staging form: Breast, AJCC 8th Edition - Clinical: Stage IV (cT52mi, cNX, cM1, G3, ER: Unknown, PR: Unknown, HER2: Unknown) - Signed by Serena Croissant, MD on 02/06/2023 Histologic grading system: 3 grade system   02/14/2023 -  Chemotherapy   Patient is on Treatment Plan : BREAST DOCEtaxel + Trastuzumab + Pertuzumab (THP) q21d x 8 cycles / Trastuzumab + Pertuzumab q21d x 4 cycles     AML (acute myeloid leukemia)  09/1998 Initial Diagnosis   AML (acute myeloid leukemia) (HCC) treated with 2 induction regimens followed by 6 consolidation regimens, remission     CHIEF COMPLIANT: Taxotere Herceptin Perjeta cycle 2   INTERVAL HISTORY: Grisel Blumenstock is a 47 y.o. with above-mentioned history of right breast DCIS for which she underwent a lumpectomy. She presents to the clinic today for a follow-up. She reports that Saturday that she did have diarrhea that she took imodium for. She says she had a UTI and some fatigue    ALLERGIES:  is allergic to tamoxifen citrate and tape.  MEDICATIONS:  Current Outpatient Medications  Medication Sig Dispense Refill   cephALEXin (KEFLEX) 500 MG capsule  Take 1 capsule (500 mg total) by mouth 2 (two) times daily. 10 capsule 0   cholecalciferol (VITAMIN D3) 25 MCG (1000 UNIT) tablet Take 2,000 Units by mouth  daily.     hydrochlorothiazide (HYDRODIURIL) 25 MG tablet TAKE 1 TABLET BY MOUTH EVERY DAY IN THE MORNING FOR 30 DAYS ONCE A DAY     magic mouthwash w/lidocaine SOLN Take 5 mLs by mouth 4 (four) times daily as needed for mouth pain. 240 mL 2   olmesartan (BENICAR) 5 MG tablet Take by mouth.     ondansetron (ZOFRAN) 8 MG tablet Take 1 tablet (8 mg total) by mouth every 8 (eight) hours as needed for nausea or vomiting. 30 tablet 1   oxyCODONE-acetaminophen (PERCOCET/ROXICET) 5-325 MG tablet Take 1 tablet by mouth every 6 (six) hours as needed for severe pain. 15 tablet 0   potassium chloride SA (KLOR-CON M) 20 MEQ tablet Take 1 tablet (20 mEq total) by mouth daily. 30 tablet 3   No current facility-administered medications for this visit.    PHYSICAL EXAMINATION: ECOG PERFORMANCE STATUS: 1 - Symptomatic but completely ambulatory  Vitals:   03/07/23 1107  BP: (!) 183/116  Pulse: 81  Resp: 18  Temp: 97.8 F (36.6 C)  SpO2: 100%   Filed Weights   03/07/23 1107  Weight: 170 lb 6.4 oz (77.3 kg)      LABORATORY DATA:  I have reviewed the data as listed    Latest Ref Rng & Units 03/07/2023   11:00 AM 02/26/2023   12:01 AM 02/25/2023   11:55 AM  CMP  Glucose 70 - 99 mg/dL 96  98  95   BUN 6 - 20 mg/dL Creatinine 0.44 - 1.00 mg/dL 0.98  1.19  1.47   Sodium 135 - 145 mmol/L 140  138  137   Potassium 3.5 - 5.1 mmol/L 3.1  3.5  2.8   Chloride 98 - 111 mmol/L 106  104  100   CO2 22 - 32 mmol/L Calcium 8.9 - 10.3 mg/dL 9.2  8.5  8.7   Total Protein 6.5 - 8.1 g/dL 7.3  6.6  7.3   Total Bilirubin 0.3 - 1.2 mg/dL 0.3  1.0  0.4   Alkaline Phos 38 - 126 U/L 60  55  50   AST 15 - 41 U/L ALT 0 - 44 U/L Lab Results  Component Value Date   WBC 12.1 (H) 03/07/2023   HGB 12.1 03/07/2023   HCT 36.1 03/07/2023   MCV 86.2 03/07/2023   PLT 258 03/07/2023   NEUTROABS 8.2 (H) 03/07/2023    ASSESSMENT & PLAN:  Breast cancer metastasized to  liver (HCC) 02/11/19: RT lumpectomy: DICS Intermediate grade, Margins Neg, ER 90%, PR 70%; Lt Lumpectomy: UDH status post radiation, could not tolerate tamoxifen recurrence: 08/15/2021:MRI surveillance detected Right breast calcifications 5 cm, Ant biopsy: IG DCIS with necrosis, ER 40%, PR 0%, Posterior biopsy: Intermediate grade DCIS with [facility]  11/29/2021: Right mastectomy: High-grade DCIS with microinvasion, ER 0%, PR 0%, HER2 positive, 0/1 lymph node negative  T1c MIC, N0: Stage Ia   Patient had second opinion at [facility] with Dr. Creig Hines who agreed with her plan of no systemic chemotherapy   Breast cancer surveillance: 1.  Mammogram 08/22/2022: Left breast benign breast density category B  2.  CT CAP 12/09/2022:  Multiple liver lesions indicative of cysts and hemangiomas new right hepatic lobe lesion favored to be hemangioma otherwise no evidence of metastatic disease.  Radiology recommended an MRI of the liver. 3.  Liver MRI 01/08/2023: 2 New rim-enhancing lesions 2.3 cm (was 1.6 cm on 12/09/2022), 1.8 cm (measured 1.5 cm), benign hemangioma 2.4 cm and 1.9 cm and 1.3 cm unchanged 4.  PET CT scan 01/22/2023: Liver: 2.5 cm and 2.4 cm lesions ------------------------------------------------------------------------------------------------------------------------------------------------- 01/31/2023: Liver lesion biopsy: Metastatic adenocarcinoma to the liver consistent with breast primary positive for CK7 and GATA3, ER 0%, PR 0%, Ki-67 25%, HER2 3+ positive, [facility] molecular testing is pending   Current treatment: Taxotere Herceptin Perjeta cycle 2 Chemo toxicities: Flank pain: saw symptom management Muscle cramps: Due to hypokalemia: Replaced Mild sores: Has Magic mouthwash Taste changes Neutropenia: ANC 0.5 for nadir count.  We reduced the dosage of Taxotere to 65 mg/m from cycle 2 We will plan to give her IV fluids 1 week after chemotherapy for hydration.   I discussed with her that she could use some  ice in the mouth when she receives her chemotherapy. [facility] who agreed with the treatment plan.  Meningioma: Follows with Dr. Barbaraann CaoVaslow  Return to clinic for cycle 3    No orders of the defined types were placed in this encounter.  The patient has a good understanding of the overall plan. she agrees with it. she will call with any problems that may develop before the next visit here. Total time spent: 30 mins including face to face time and time spent for planning, charting and co-ordination of care   Tamsen MeekViinay K Tatianna Ibbotson, MD 03/07/23    I Janan Ridgeeritra, Mcnairy am acting as a Neurosurgeonscribe for [employer]Makyia Erxleben  I have reviewed the above documentation for accuracy and completeness, and I agree with the above.

## 2023-03-07 ENCOUNTER — Other Ambulatory Visit: Payer: Self-pay

## 2023-03-07 ENCOUNTER — Inpatient Hospital Stay (HOSPITAL_BASED_OUTPATIENT_CLINIC_OR_DEPARTMENT_OTHER): Payer: BC Managed Care – PPO | Admitting: Hematology and Oncology

## 2023-03-07 ENCOUNTER — Telehealth: Payer: Self-pay | Admitting: Hematology and Oncology

## 2023-03-07 ENCOUNTER — Inpatient Hospital Stay: Payer: BC Managed Care – PPO

## 2023-03-07 ENCOUNTER — Other Ambulatory Visit: Payer: Self-pay | Admitting: *Deleted

## 2023-03-07 VITALS — BP 183/116 | HR 81 | Temp 97.8°F | Resp 18 | Ht 65.0 in | Wt 170.4 lb

## 2023-03-07 VITALS — BP 154/92 | HR 89 | Resp 17

## 2023-03-07 DIAGNOSIS — Z5112 Encounter for antineoplastic immunotherapy: Secondary | ICD-10-CM | POA: Diagnosis not present

## 2023-03-07 DIAGNOSIS — Z170 Estrogen receptor positive status [ER+]: Secondary | ICD-10-CM | POA: Diagnosis not present

## 2023-03-07 DIAGNOSIS — C50919 Malignant neoplasm of unspecified site of unspecified female breast: Secondary | ICD-10-CM

## 2023-03-07 DIAGNOSIS — Z87891 Personal history of nicotine dependence: Secondary | ICD-10-CM | POA: Diagnosis not present

## 2023-03-07 DIAGNOSIS — D0511 Intraductal carcinoma in situ of right breast: Secondary | ICD-10-CM | POA: Diagnosis not present

## 2023-03-07 DIAGNOSIS — C787 Secondary malignant neoplasm of liver and intrahepatic bile duct: Secondary | ICD-10-CM | POA: Diagnosis not present

## 2023-03-07 DIAGNOSIS — Z79899 Other long term (current) drug therapy: Secondary | ICD-10-CM | POA: Diagnosis not present

## 2023-03-07 DIAGNOSIS — C9201 Acute myeloblastic leukemia, in remission: Secondary | ICD-10-CM | POA: Diagnosis not present

## 2023-03-07 DIAGNOSIS — K769 Liver disease, unspecified: Secondary | ICD-10-CM

## 2023-03-07 DIAGNOSIS — Z95828 Presence of other vascular implants and grafts: Secondary | ICD-10-CM

## 2023-03-07 DIAGNOSIS — Z923 Personal history of irradiation: Secondary | ICD-10-CM | POA: Diagnosis not present

## 2023-03-07 LAB — CMP (CANCER CENTER ONLY)
ALT: 15 U/L (ref 0–44)
AST: 12 U/L — ABNORMAL LOW (ref 15–41)
Albumin: 4.2 g/dL (ref 3.5–5.0)
Alkaline Phosphatase: 60 U/L (ref 38–126)
Anion gap: 7 (ref 5–15)
BUN: 16 mg/dL (ref 6–20)
CO2: 27 mmol/L (ref 22–32)
Calcium: 9.2 mg/dL (ref 8.9–10.3)
Chloride: 106 mmol/L (ref 98–111)
Creatinine: 0.64 mg/dL (ref 0.44–1.00)
GFR, Estimated: >60 mL/min (ref >60)
Glucose, Bld: 96 mg/dL (ref 70–99)
Potassium: 3.1 mmol/L — ABNORMAL LOW (ref 3.5–5.1)
Sodium: 140 mmol/L (ref 135–145)
Total Bilirubin: 0.3 mg/dL (ref 0.3–1.2)
Total Protein: 7.3 g/dL (ref 6.5–8.1)

## 2023-03-07 LAB — CBC WITH DIFFERENTIAL (CANCER CENTER ONLY)
Abs Immature Granulocytes: 0.09 10*3/uL — ABNORMAL HIGH (ref 0.00–0.07)
Basophils Absolute: 0.1 10*3/uL (ref 0.0–0.1)
Basophils Relative: 1 %
Eosinophils Absolute: 0 10*3/uL (ref 0.0–0.5)
Eosinophils Relative: 0 %
HCT: 36.1 % (ref 36.0–46.0)
Hemoglobin: 12.1 g/dL (ref 12.0–15.0)
Immature Granulocytes: 1 %
Lymphocytes Relative: 24 %
Lymphs Abs: 2.9 10*3/uL (ref 0.7–4.0)
MCH: 28.9 pg (ref 26.0–34.0)
MCHC: 33.5 g/dL (ref 30.0–36.0)
MCV: 86.2 fL (ref 80.0–100.0)
Monocytes Absolute: 0.8 10*3/uL (ref 0.1–1.0)
Monocytes Relative: 7 %
Neutro Abs: 8.2 10*3/uL — ABNORMAL HIGH (ref 1.7–7.7)
Neutrophils Relative %: 67 %
Platelet Count: 258 10*3/uL (ref 150–400)
RBC: 4.19 MIL/uL (ref 3.87–5.11)
RDW: 13.2 % (ref 11.5–15.5)
WBC Count: 12.1 10*3/uL — ABNORMAL HIGH (ref 4.0–10.5)
nRBC: 0 % (ref 0.0–0.2)

## 2023-03-07 MED ORDER — SODIUM CHLORIDE 0.9 % IV SOLN
420.0000 mg | Freq: Once | INTRAVENOUS | Status: AC
  Administered 2023-03-07: 420 mg via INTRAVENOUS
  Filled 2023-03-07: qty 14

## 2023-03-07 MED ORDER — SODIUM CHLORIDE 0.9 % IV SOLN: Freq: Once | INTRAVENOUS | Status: AC

## 2023-03-07 MED ORDER — HEPARIN SOD (PORK) LOCK FLUSH 100 UNIT/ML IV SOLN
500.0000 [IU] | Freq: Once | INTRAVENOUS | Status: AC | PRN
  Administered 2023-03-07: 500 [IU]

## 2023-03-07 MED ORDER — SODIUM CHLORIDE 0.9 % IV SOLN
10.0000 mg | Freq: Once | INTRAVENOUS | Status: AC
  Administered 2023-03-07: 10 mg via INTRAVENOUS
  Filled 2023-03-07: qty 10

## 2023-03-07 MED ORDER — SODIUM CHLORIDE 0.9% FLUSH
10.0000 mL | Freq: Once | INTRAVENOUS | Status: AC
  Administered 2023-03-07: 10 mL

## 2023-03-07 MED ORDER — SODIUM CHLORIDE 0.9 % IV SOLN
65.0000 mg/m2 | Freq: Once | INTRAVENOUS | Status: AC
  Administered 2023-03-07: 121 mg via INTRAVENOUS
  Filled 2023-03-07: qty 12.1

## 2023-03-07 MED ORDER — TRASTUZUMAB-ANNS CHEMO 150 MG IV SOLR
6.0000 mg/kg | Freq: Once | INTRAVENOUS | Status: AC
  Administered 2023-03-07: 420 mg via INTRAVENOUS
  Filled 2023-03-07: qty 20

## 2023-03-07 MED ORDER — ACETAMINOPHEN 325 MG PO TABS
650.0000 mg | ORAL_TABLET | Freq: Once | ORAL | Status: AC
  Administered 2023-03-07: 650 mg via ORAL
  Filled 2023-03-07: qty 2

## 2023-03-07 MED ORDER — SODIUM CHLORIDE 0.9% FLUSH
10.0000 mL | INTRAVENOUS | Status: DC | PRN
  Administered 2023-03-07: 10 mL

## 2023-03-07 MED ORDER — DIPHENHYDRAMINE HCL 25 MG PO CAPS
50.0000 mg | ORAL_CAPSULE | Freq: Once | ORAL | Status: AC
  Administered 2023-03-07: 50 mg via ORAL
  Filled 2023-03-07: qty 2

## 2023-03-07 NOTE — Assessment & Plan Note (Addendum)
02/11/19: RT lumpectomy: DICS Intermediate grade, Margins Neg, ER 90%, PR 70%; Lt Lumpectomy: UDH status post radiation, could not tolerate tamoxifen recurrence: 08/15/2021:MRI surveillance detected Right breast calcifications 5 cm, Ant biopsy: IG DCIS with necrosis, ER 40%, PR 0%, Posterior biopsy: Intermediate grade DCIS with [facility]  11/29/2021: Right mastectomy: High-grade DCIS with microinvasion, ER 0%, PR 0%, HER2 positive, 0/1 lymph node negative  T1c MIC, N0: Stage Ia   Patient had second opinion at [facility] with Dr. Creig Hines who agreed with her plan of no systemic chemotherapy   Breast cancer surveillance: 1.  Mammogram 08/22/2022: Left breast benign breast density category B  2.  CT CAP 12/09/2022: Multiple liver lesions indicative of cysts and hemangiomas new right hepatic lobe lesion favored to be hemangioma otherwise no evidence of metastatic disease.  Radiology recommended an MRI of the liver. 3.  Liver MRI 01/08/2023: 2 New rim-enhancing lesions 2.3 cm (was 1.6 cm on 12/09/2022), 1.8 cm (measured 1.5 cm), benign hemangioma 2.4 cm and 1.9 cm and 1.3 cm unchanged 4.  PET CT scan 01/22/2023: Liver: 2.5 cm and 2.4 cm lesions ------------------------------------------------------------------------------------------------------------------------------------------------- 01/31/2023: Liver lesion biopsy: Metastatic adenocarcinoma to the liver consistent with breast primary positive for CK7 and GATA3, ER 0%, PR 0%, Ki-67 25%, HER2 3+ positive, [facility] molecular testing is pending   Current treatment: Taxotere Herceptin Perjeta cycle 2 Chemo toxicities: Flank pain: saw symptom management Muscle cramps: Due to hypokalemia: Replaced Mild sores: Has Magic mouthwash Taste changes Neutropenia: ANC 0.5 for nadir count.  We reduced the dosage of Taxotere to 65 mg/m from cycle 2 We will plan to give her IV fluids 1 week after chemotherapy for hydration.   I discussed with her that she could use some ice in the  mouth when she receives her chemotherapy.   Meningioma: Follows with Dr. Barbaraann Cao  Return to clinic for cycle 3

## 2023-03-07 NOTE — Patient Instructions (Signed)
[facility]  Discharge Instructions: Thank you for choosing [facility] to provide your oncology and hematology care.   If you have a lab appointment with the Cancer Center, please go directly to the Cancer Center and check in at the registration area.   Wear comfortable clothing and clothing appropriate for easy access to any Portacath or PICC line.   We strive to give you quality time with your provider. You may need to reschedule your appointment if you arrive late (15 or more minutes).  Arriving late affects you and other patients whose appointments are after yours.  Also, if you miss three or more appointments without notifying the office, you may be dismissed from the clinic at the provider's discretion.      For prescription refill requests, have your pharmacy contact our office and allow 72 hours for refills to be completed.    Today you received the following chemotherapy and/or immunotherapy agents: trastuzumab, pertuzumab, and docetaxel      To help prevent nausea and vomiting after your treatment, we encourage you to take your nausea medication as directed.  BELOW ARE SYMPTOMS THAT SHOULD BE REPORTED IMMEDIATELY: *FEVER GREATER THAN 100.4 F (38 C) OR HIGHER *CHILLS OR SWEATING *NAUSEA AND VOMITING THAT IS NOT CONTROLLED WITH YOUR NAUSEA MEDICATION *UNUSUAL SHORTNESS OF BREATH *UNUSUAL BRUISING OR BLEEDING *URINARY PROBLEMS (pain or burning when urinating, or frequent urination) *BOWEL PROBLEMS (unusual diarrhea, constipation, pain near the anus) TENDERNESS IN MOUTH AND THROAT WITH OR WITHOUT PRESENCE OF ULCERS (sore throat, sores in mouth, or a toothache) UNUSUAL RASH, SWELLING OR PAIN  UNUSUAL VAGINAL DISCHARGE OR ITCHING   Items with * indicate a potential emergency and should be followed up as soon as possible or go to the Emergency Department if any problems should occur.  Please show the CHEMOTHERAPY ALERT CARD or  IMMUNOTHERAPY ALERT CARD at check-in to the Emergency Department and triage nurse.  Should you have questions after your visit or need to cancel or reschedule your appointment, please contact [facility]  Dept: [phone]  and follow the prompts.  Office hours are 8:00 a.m. to 4:30 p.m. Monday - Friday. Please note that voicemails left after 4:00 p.m. may not be returned until the following business day.  We are closed weekends and major holidays. You have access to a nurse at all times for urgent questions. Please call the main number to the clinic Dept: [phone] and follow the prompts.   For any non-urgent questions, you may also contact your provider using MyChart. We now offer e-Visits for anyone 19 and older to request care online for non-urgent symptoms. For details visit [email or web address].   Also download the MyChart app! Go to the app store, search "MyChart", open the app, select [place], and log in with your MyChart username and password.

## 2023-03-07 NOTE — Telephone Encounter (Signed)
Contacted patient to scheduled appointments. Patient is aware of appointments that are scheduled.   

## 2023-03-07 NOTE — Addendum Note (Signed)
Addended by: Neita Goodnight on: 03/07/2023 12:10 PM   Modules accepted: Orders

## 2023-03-10 ENCOUNTER — Inpatient Hospital Stay (HOSPITAL_BASED_OUTPATIENT_CLINIC_OR_DEPARTMENT_OTHER): Payer: BC Managed Care – PPO | Admitting: Physician Assistant

## 2023-03-10 ENCOUNTER — Other Ambulatory Visit: Payer: Self-pay | Admitting: *Deleted

## 2023-03-10 ENCOUNTER — Inpatient Hospital Stay: Payer: BC Managed Care – PPO

## 2023-03-10 ENCOUNTER — Other Ambulatory Visit: Payer: Self-pay

## 2023-03-10 VITALS — BP 133/95 | HR 87 | Temp 98.2°F | Resp 14 | Wt 170.5 lb

## 2023-03-10 VITALS — BP 148/98 | HR 81

## 2023-03-10 DIAGNOSIS — C50919 Malignant neoplasm of unspecified site of unspecified female breast: Secondary | ICD-10-CM

## 2023-03-10 DIAGNOSIS — C787 Secondary malignant neoplasm of liver and intrahepatic bile duct: Secondary | ICD-10-CM | POA: Diagnosis not present

## 2023-03-10 DIAGNOSIS — D0511 Intraductal carcinoma in situ of right breast: Secondary | ICD-10-CM

## 2023-03-10 DIAGNOSIS — Z5112 Encounter for antineoplastic immunotherapy: Secondary | ICD-10-CM | POA: Diagnosis not present

## 2023-03-10 DIAGNOSIS — K769 Liver disease, unspecified: Secondary | ICD-10-CM

## 2023-03-10 DIAGNOSIS — Z95828 Presence of other vascular implants and grafts: Secondary | ICD-10-CM

## 2023-03-10 DIAGNOSIS — Z79899 Other long term (current) drug therapy: Secondary | ICD-10-CM | POA: Diagnosis not present

## 2023-03-10 DIAGNOSIS — Z170 Estrogen receptor positive status [ER+]: Secondary | ICD-10-CM | POA: Diagnosis not present

## 2023-03-10 DIAGNOSIS — M791 Myalgia, unspecified site: Secondary | ICD-10-CM | POA: Diagnosis not present

## 2023-03-10 DIAGNOSIS — R197 Diarrhea, unspecified: Secondary | ICD-10-CM | POA: Diagnosis not present

## 2023-03-10 DIAGNOSIS — Z87891 Personal history of nicotine dependence: Secondary | ICD-10-CM | POA: Diagnosis not present

## 2023-03-10 DIAGNOSIS — C9201 Acute myeloblastic leukemia, in remission: Secondary | ICD-10-CM | POA: Diagnosis not present

## 2023-03-10 DIAGNOSIS — Z923 Personal history of irradiation: Secondary | ICD-10-CM | POA: Diagnosis not present

## 2023-03-10 DIAGNOSIS — R300 Dysuria: Secondary | ICD-10-CM | POA: Diagnosis not present

## 2023-03-10 LAB — CMP (CANCER CENTER ONLY)
ALT: 13 U/L (ref 0–44)
AST: 11 U/L — ABNORMAL LOW (ref 15–41)
Albumin: 4 g/dL (ref 3.5–5.0)
Alkaline Phosphatase: 50 U/L (ref 38–126)
Anion gap: 5 (ref 5–15)
BUN: 16 mg/dL (ref 6–20)
CO2: 27 mmol/L (ref 22–32)
Calcium: 8.8 mg/dL — ABNORMAL LOW (ref 8.9–10.3)
Chloride: 106 mmol/L (ref 98–111)
Creatinine: 0.55 mg/dL (ref 0.44–1.00)
GFR, Estimated: >60 mL/min (ref >60)
Glucose, Bld: 94 mg/dL (ref 70–99)
Potassium: 3.7 mmol/L (ref 3.5–5.1)
Sodium: 138 mmol/L (ref 135–145)
Total Bilirubin: 0.3 mg/dL (ref 0.3–1.2)
Total Protein: 6.7 g/dL (ref 6.5–8.1)

## 2023-03-10 LAB — CBC WITH DIFFERENTIAL (CANCER CENTER ONLY)
Abs Immature Granulocytes: 0.01 10*3/uL (ref 0.00–0.07)
Basophils Absolute: 0 10*3/uL (ref 0.0–0.1)
Basophils Relative: 1 %
Eosinophils Absolute: 0.1 10*3/uL (ref 0.0–0.5)
Eosinophils Relative: 1 %
HCT: 35.8 % — ABNORMAL LOW (ref 36.0–46.0)
Hemoglobin: 11.9 g/dL — ABNORMAL LOW (ref 12.0–15.0)
Immature Granulocytes: 0 %
Lymphocytes Relative: 30 %
Lymphs Abs: 1.4 10*3/uL (ref 0.7–4.0)
MCH: 28.8 pg (ref 26.0–34.0)
MCHC: 33.2 g/dL (ref 30.0–36.0)
MCV: 86.7 fL (ref 80.0–100.0)
Monocytes Absolute: 0.1 10*3/uL (ref 0.1–1.0)
Monocytes Relative: 3 %
Neutro Abs: 3 10*3/uL (ref 1.7–7.7)
Neutrophils Relative %: 65 %
Platelet Count: 207 10*3/uL (ref 150–400)
RBC: 4.13 MIL/uL (ref 3.87–5.11)
RDW: 13.6 % (ref 11.5–15.5)
WBC Count: 4.6 10*3/uL (ref 4.0–10.5)
nRBC: 0 % (ref 0.0–0.2)

## 2023-03-10 LAB — URINALYSIS, COMPLETE (UACMP) WITH MICROSCOPIC
Bacteria, UA: NONE SEEN
Bilirubin Urine: NEGATIVE
Glucose, UA: NEGATIVE mg/dL
Hgb urine dipstick: NEGATIVE
Ketones, ur: NEGATIVE mg/dL
Leukocytes,Ua: NEGATIVE
Nitrite: NEGATIVE
Protein, ur: NEGATIVE mg/dL
Specific Gravity, Urine: 1.01 (ref 1.005–1.030)
pH: 7 (ref 5.0–8.0)

## 2023-03-10 LAB — MAGNESIUM: Magnesium: 2 mg/dL (ref 1.7–2.4)

## 2023-03-10 LAB — SAMPLE TO BLOOD BANK

## 2023-03-10 MED ORDER — HEPARIN SOD (PORK) LOCK FLUSH 100 UNIT/ML IV SOLN
500.0000 [IU] | Freq: Once | INTRAVENOUS | Status: AC
  Administered 2023-03-10: 500 [IU]

## 2023-03-10 MED ORDER — SODIUM CHLORIDE 0.9% FLUSH
10.0000 mL | Freq: Once | INTRAVENOUS | Status: AC
  Administered 2023-03-10: 10 mL

## 2023-03-10 MED ORDER — SODIUM CHLORIDE 0.9 % IV SOLN: Freq: Once | INTRAVENOUS | Status: AC

## 2023-03-10 NOTE — Progress Notes (Signed)
Received call from pt with complaint of severe fatigue and bilateral lower extremity cramping. Pt also states she is having frequent loose stools as well (went 5 times yesterday and twice already this morning).  Per MD pt needing labs and [facility]. Appt scheduled, pt notified and verbalized understanding.

## 2023-03-10 NOTE — Progress Notes (Signed)
Symptom Management Consult Note [facility]    Patient Care Team: [name], MD as PCP - General (Family Medicine) Donnelly Angelica, RN as Oncology Nurse Navigator Pershing Proud, RN as Oncology Nurse Navigator Serena Croissant, MD as Consulting Physician (Hematology and Oncology) Default, Provider, MD as Technician    Name / MRN / DOB: Joann Meadows  [id number]  1976-09-20   Date of visit: 03/10/2023   Chief Complaint/Reason for visit: fatigue, diarrhea, leg cramping   Current Therapy: Taxotere, Perjeta and Kanjinti   Last treatment:  Day 1   Cycle 2 on 03/07/23   ASSESSMENT & PLAN: Patient is a 47 y.o. female  with oncologic history of carcinoma of breast metastatic to liver followed by Dr. Pamelia Hoit.  I have viewed most recent oncology note and lab work.    #Carcinoma of breast metastatic to liver  - Had dose reduction of Taxotere starting cycle 2. - Next appointment with oncologist is 03/28/23  #Symptom management: Diarrhea, muscle cramping, fatigue - Describing diarrhea as soft stool, no indication for stool testing today. - After previous treatments diarrhea resolved with Imodium.  Encouraged her to start taking again as directed. - Labs today without significant electrolyte derangement.  Patient will continue taking her p.o. potassium as prescribed by oncologist.  Muscle cramps are more suggestive of myalgias based on HPI. They have been ongoing since starting treatment. Extremity exam is benign.Likely side effect of treatment. - CBC without significant anemia, therefore not the cause of her fatigue. She is able to perform ADLs.  #Urinary frequency -UA is negative for infection.  - Patient is drinking 2L of water per day. This could contribute to her urinary frequency. It is reassuring she is not experiencing dysuria. In the absence of nitrites, leukocytes, and WBC as well as fever do not feel antibiotic treatment is needed at this time.   Strict  ED precautions discussed should symptoms worsen.    Heme/Onc History: Oncology History  Breast cancer metastasized to liver  01/25/2019 Initial Diagnosis   Screening mammogram showed bilateral calcifications right breast 5 mm, pleomorphic.  Left breast 1.6 cm punctate, smaller group of calcifications spanning 3 mm.  Right lower outer quadrant biopsy revealed low-grade DCIS ER 90%, PR 70%; biopsy of left breast calcifications ALH and PASH   01/31/2019 Genetic Testing   ATM c.8495G>A and RECQL4 c.2587G>A VUS identified on the 9-gene STAT panel and Multi-cancer panel through [facility].  The STAT Breast cancer panel offered by [facility] includes sequencing and rearrangement analysis for the following 9 genes:  ATM, BRCA1, BRCA2, CDH1, CHEK2, PALB2, PTEN, STK11 and TP53.   The Multi-Gene Panel offered by [facility] includes sequencing and/or deletion duplication testing of the following 84 genes: AIP, ALK, APC, ATM, AXIN2,BAP1,  BARD1, BLM, BMPR1A, BRCA1, BRCA2, BRIP1, CASR, CDC73, CDH1, CDK4, CDKN1B, CDKN1C, CDKN2A (p14ARF), CDKN2A (p16INK4a), CEBPA, CHEK2, CTNNA1, DICER1, DIS3L2, EGFR (c.2369C>T, p.Thr790Met variant only), EPCAM (Deletion/duplication testing only), FH, FLCN, GATA2, GPC3, GREM1 (Promoter region deletion/duplication testing only), HOXB13 (c.251G>A, p.Gly84Glu), HRAS, KIT, MAX, MEN1, MET, MITF (c.952G>A, p.Glu318Lys variant only), MLH1, MSH2, MSH3, MSH6, MUTYH, NBN, NF1, NF2, NTHL1, PALB2, PDGFRA, PHOX2B, PMS2, POLD1, POLE, POT1, PRKAR1A, PTCH1, PTEN, RAD50, RAD51C, RAD51D, RB1, RECQL4, RET, RUNX1, SDHAF2, SDHA (sequence changes only), SDHB, SDHC, SDHD, SMAD4, SMARCA4, SMARCB1, SMARCE1, STK11, SUFU, TERC, TERT, TMEM127, TP53, TSC1, TSC2, VHL, WRN and WT1.  he report date is February 02, 2019.   02/11/2019 Surgery   RT lumpectomy: DICS Intermediate grade, Margins Neg,  ER 90%, PR 70%; Lt Lumpectomy: UDH   02/26/2019 -  Anti-estrogen oral therapy   Tamoxifen  daily, stopped because of depression and  facial numbness   05/20/2019 - 06/17/2019 Radiation Therapy   Adjuvant XRT   08/15/2021 Relapse/Recurrence   MRI surveillance detected Right breast calcifications 5 cm, Ant biopsy: IG DCIS with necrosis, ER 40%, PR 0%, Posterior biopsy: Intermediate grade DCIS with [facility]   08/19/2022 Cancer Staging   Staging form: Breast, AJCC 8th Edition - Clinical: Stage IV (cT74mi, cNX, cM1, G3, ER: Unknown, PR: Unknown, HER2: Unknown) - Signed by Serena Croissant, MD on 02/06/2023 Histologic grading system: 3 grade system   02/14/2023 -  Chemotherapy   Patient is on Treatment Plan : BREAST DOCEtaxel + Trastuzumab + Pertuzumab (THP) q21d x 8 cycles / Trastuzumab + Pertuzumab q21d x 4 cycles     AML (acute myeloid leukemia)  09/1998 Initial Diagnosis   AML (acute myeloid leukemia) (HCC) treated with 2 induction regimens followed by 6 consolidation regimens, remission       Interval history-: Joann Meadows is a 47 y.o. female with oncologic history as above presenting to [facility] today with chief complaint of diarrhea, muscle cramps and fatigue.  She is accompanied by her spouse who provides additional history.  Patient states her symptoms have been ongoing ever since she started treatment.  They have not worsened.  She states her diarrhea started yesterday.  She had 5 episodes of soft stool.  She did notice bright red blood in the stool however has history of hemorrhoids and thinks the area might be irritated after wiping.  She denies any black tarry stool.  She also experienced diarrhea after her last treatment which resolved with Imodium.  She did not take any Imodium for the diarrhea at this time, she wanted to confirm it was safe to do so.  Patient states ever since her steroids have worn off after treatment she feels like she is fatigued.  She is still able to perform her ADLs, just has to try harder to do so.  She is also continuing to report cramping in her legs.  It is intermittent and resolved with Tylenol.   She denies any fall, injury numbness or tingling or weakness.  She admits to drinking 2 L of fluid per day and having good appetite.  Patient states she has urinary frequency however denies any dysuria.  This also has been ongoing since she for started treatment.  She was treated with a round of antibiotics for possible UTI in the past, symptoms never changed with antibiotic use.  No fevers.  Patient admits she is very worried about her potassium level as it was low in the past.  She did start a new blood pressure medication, she is unsure if it is working, it is being managed by her PCP and she has follow-up with them next week.      ROS  All other systems are reviewed and are negative for acute change except as noted in the HPI.    Allergies  Allergen Reactions   Tamoxifen Citrate     Other reaction(s): depression, joint pain   Tape Rash     Past Medical History:  Diagnosis Date   Breast cancer 2020   Right Breast Cancer   Endometriosis    Fibroid    Hx of radiation therapy 06/2019   Leukemia    20 years ago   Personal history of radiation therapy 2020   Right Breast Cancer  PONV (postoperative nausea and vomiting)    pt states she vomits after colonoscopies     Past Surgical History:  Procedure Laterality Date   BREAST EXCISIONAL BIOPSY Left 02/11/2019   [facility]   BREAST LUMPECTOMY Right 02/11/2019   BREAST LUMPECTOMY WITH RADIOACTIVE SEED LOCALIZATION Bilateral 02/11/2019   Procedure: BILATERAL BREAST LUMPECTOMIES WITH BILATERAL RADIOACTIVE SEED LOCALIZATION;  Surgeon: Manus Rudd, MD;  Location: [facility];  Service: General;  Laterality: Bilateral;   IR IMAGING GUIDED PORT INSERTION  02/25/2023   IR RADIOLOGIST EVAL & MGMT  02/27/2023   MASTECTOMY Right 11/2021    Social History   Socioeconomic History   Marital status: Single    Spouse name: Not on file   Number of children: 2   Years of education: Not on file   Highest education level: High  school graduate  Occupational History   Not on file  Tobacco Use   Smoking status: Former    Years: 10    Types: Cigarettes    Quit date: 01/25/2015    Years since quitting: 8.1   Smokeless tobacco: Never  Vaping Use   Vaping Use: Never used  Substance and Sexual Activity   Alcohol use: No   Drug use: No   Sexual activity: Yes    Birth control/protection: None  Other Topics Concern   Not on file  Social History Narrative   Lives with children   Social Determinants of Health   Financial Resource Strain: Not on file  Food Insecurity: No Food Insecurity (10/03/2022)   Hunger Vital Sign    Worried About Running Out of Food in the Last Year: Never true    Ran Out of Food in the Last Year: Never true  Transportation Needs: No Transportation Needs (10/03/2022)   PRAPARE - Administrator, Civil Service (Medical): No    Lack of Transportation (Non-Medical): No  Physical Activity: Not on file  Stress: Not on file  Social Connections: Not on file  Intimate Partner Violence: Not At Risk (02/26/2019)   Humiliation, Afraid, Rape, and Kick questionnaire    Fear of Current or Ex-Partner: No    Emotionally Abused: No    Physically Abused: No    Sexually Abused: No    Family History  Problem Relation Age of Onset   Colon cancer Mother 95       d. 30   Hypertension Father    Diabetes Maternal Aunt    Diabetes Maternal Uncle    Mental retardation Maternal Uncle    Diabetes Paternal Aunt    Diabetes Maternal Grandmother    Stroke Paternal Grandmother        c. 104   Leukemia Paternal Grandfather        d. 31; ?CLL   Leukemia Cousin 4       mat first cousin   Lymphoma Cousin 40       pat first cousin     Current Outpatient Medications:    cephALEXin (KEFLEX) 500 MG capsule, Take 1 capsule (500 mg total) by mouth 2 (two) times daily., Disp: 10 capsule, Rfl: 0   cholecalciferol (VITAMIN D3) 25 MCG (1000 UNIT) tablet, Take 2,000 Units by mouth daily., Disp: , Rfl:     hydrochlorothiazide (HYDRODIURIL) 25 MG tablet, TAKE 1 TABLET BY MOUTH EVERY DAY IN THE MORNING FOR 30 DAYS ONCE A DAY, Disp: , Rfl:    magic mouthwash w/lidocaine SOLN, Take 5 mLs by mouth 4 (four) times daily as  needed for mouth pain., Disp: 240 mL, Rfl: 2   olmesartan (BENICAR) 5 MG tablet, Take by mouth., Disp: , Rfl:    ondansetron (ZOFRAN) 8 MG tablet, Take 1 tablet (8 mg total) by mouth every 8 (eight) hours as needed for nausea or vomiting., Disp: 30 tablet, Rfl: 1   oxyCODONE-acetaminophen (PERCOCET/ROXICET) 5-325 MG tablet, Take 1 tablet by mouth every 6 (six) hours as needed for severe pain., Disp: 15 tablet, Rfl: 0   potassium chloride SA (KLOR-CON M) 20 MEQ tablet, Take 1 tablet (20 mEq total) by mouth daily., Disp: 30 tablet, Rfl: 3  PHYSICAL EXAM: ECOG FS:1 - Symptomatic but completely ambulatory    Vitals:   03/10/23 1022  BP: (!) 133/95  Pulse: 87  Resp: 14  Temp: 98.2 F (36.8 C)  TempSrc: Oral  SpO2: 99%  Weight: 170 lb 8 oz (77.3 kg)   Physical Exam Vitals and nursing note reviewed.  Constitutional:      Appearance: She is not ill-appearing or toxic-appearing.  HENT:     Head: Normocephalic.     Mouth/Throat:     Mouth: Mucous membranes are moist.  Eyes:     Conjunctiva/sclera: Conjunctivae normal.  Cardiovascular:     Rate and Rhythm: Normal rate and regular rhythm.     Pulses: Normal pulses.     Heart sounds: Normal heart sounds.  Pulmonary:     Effort: Pulmonary effort is normal.     Breath sounds: Normal breath sounds.  Chest:     Comments: PAC without signs of infection  Abdominal:     General: There is no distension.     Palpations: Abdomen is soft.     Tenderness: There is no abdominal tenderness. There is no guarding or rebound.  Musculoskeletal:        General: Normal range of motion.     Cervical back: Normal range of motion.     Right lower leg: No edema.     Left lower leg: No edema.  Skin:    General: Skin is warm and dry.   Neurological:     Mental Status: She is alert.     Comments: Normal gait Normal strength in the bilateral lower extremities        LABORATORY DATA: I have reviewed the data as listed    Latest Ref Rng & Units 03/10/2023   10:44 AM 03/07/2023   11:00 AM 02/26/2023   12:01 AM  CBC  WBC 4.0 - 10.5 K/uL 4.6  12.1  2.7   Hemoglobin 12.0 - 15.0 g/dL 84.5  36.4  68.0   Hematocrit 36.0 - 46.0 % 35.8  36.1  36.9   Platelets 150 - 400 K/uL 207  258  319         Latest Ref Rng & Units 03/10/2023   10:44 AM 03/07/2023   11:00 AM 02/26/2023   12:01 AM  CMP  Glucose 70 - 99 mg/dL 94  96  98   BUN 6 - 20 mg/dL 16  16  15    Creatinine 0.44 - 1.00 mg/dL 3.21  2.24  8.25   Sodium 135 - 145 mmol/L 138  140  138   Potassium 3.5 - 5.1 mmol/L 3.7  3.1  3.5   Chloride 98 - 111 mmol/L 106  106  104   CO2 22 - 32 mmol/L 27  27  26    Calcium 8.9 - 10.3 mg/dL 8.8  9.2  8.5   Total Protein 6.5 -  8.1 g/dL 6.7  7.3  6.6   Total Bilirubin 0.3 - 1.2 mg/dL 0.3  0.3  1.0   Alkaline Phos 38 - 126 U/L 50  60  55   AST 15 - 41 U/L 11  12  23    ALT 0 - 44 U/L 13  15  21         RADIOGRAPHIC STUDIES (from last 24 hours if applicable) I have personally reviewed the radiological images as listed and agreed with the findings in the report. No results found.      Visit Diagnosis: 1. Dysuria   2. Ductal carcinoma in situ (DCIS) of right breast   3. Diarrhea, unspecified type   4. Myalgia      Orders Placed This Encounter  Procedures   Urine Culture    Standing Status:   Future    Number of Occurrences:   1    Standing Expiration Date:   03/09/2024   Urinalysis, Complete w Microscopic    Standing Status:   Future    Number of Occurrences:   1    Standing Expiration Date:   03/09/2024    All questions were answered. The patient knows to call the clinic with any problems, questions or concerns. No barriers to learning was detected.  I have spent a total of 20 minutes minutes of face-to-face and  non-face-to-face time, preparing to see the patient, obtaining and/or reviewing separately obtained history, performing a medically appropriate examination, counseling and educating the patient, ordering tests, documenting clinical information in the electronic health record, and care coordination (communications with other health care professionals or caregivers).    Thank you for allowing me to participate in the care of this patient.    Shanon Ace, PA-C Department of Hematology/Oncology [facility] at [facility] Phone: [phone]  [phone]    03/10/2023 3:47 PM

## 2023-03-11 LAB — URINE CULTURE: Culture: NO GROWTH

## 2023-03-14 ENCOUNTER — Other Ambulatory Visit: Payer: Self-pay

## 2023-03-14 ENCOUNTER — Inpatient Hospital Stay: Payer: BC Managed Care – PPO

## 2023-03-14 VITALS — BP 142/91 | HR 86 | Temp 98.6°F | Resp 17

## 2023-03-14 DIAGNOSIS — E7800 Pure hypercholesterolemia, unspecified: Secondary | ICD-10-CM | POA: Diagnosis not present

## 2023-03-14 DIAGNOSIS — R7301 Impaired fasting glucose: Secondary | ICD-10-CM | POA: Diagnosis not present

## 2023-03-14 DIAGNOSIS — Z79899 Other long term (current) drug therapy: Secondary | ICD-10-CM | POA: Diagnosis not present

## 2023-03-14 DIAGNOSIS — Z87891 Personal history of nicotine dependence: Secondary | ICD-10-CM | POA: Diagnosis not present

## 2023-03-14 DIAGNOSIS — K769 Liver disease, unspecified: Secondary | ICD-10-CM

## 2023-03-14 DIAGNOSIS — D0511 Intraductal carcinoma in situ of right breast: Secondary | ICD-10-CM | POA: Diagnosis not present

## 2023-03-14 DIAGNOSIS — I10 Essential (primary) hypertension: Secondary | ICD-10-CM | POA: Diagnosis not present

## 2023-03-14 DIAGNOSIS — Z170 Estrogen receptor positive status [ER+]: Secondary | ICD-10-CM | POA: Diagnosis not present

## 2023-03-14 DIAGNOSIS — C9201 Acute myeloblastic leukemia, in remission: Secondary | ICD-10-CM | POA: Diagnosis not present

## 2023-03-14 DIAGNOSIS — Z95828 Presence of other vascular implants and grafts: Secondary | ICD-10-CM

## 2023-03-14 DIAGNOSIS — C787 Secondary malignant neoplasm of liver and intrahepatic bile duct: Secondary | ICD-10-CM | POA: Diagnosis not present

## 2023-03-14 DIAGNOSIS — Z923 Personal history of irradiation: Secondary | ICD-10-CM | POA: Diagnosis not present

## 2023-03-14 DIAGNOSIS — Z5112 Encounter for antineoplastic immunotherapy: Secondary | ICD-10-CM | POA: Diagnosis not present

## 2023-03-14 MED ORDER — SODIUM CHLORIDE 0.9 % IV SOLN: Freq: Once | INTRAVENOUS | Status: AC

## 2023-03-14 MED ORDER — SODIUM CHLORIDE 0.9% FLUSH
10.0000 mL | Freq: Once | INTRAVENOUS | Status: AC
  Administered 2023-03-14: 10 mL

## 2023-03-14 MED ORDER — HEPARIN SOD (PORK) LOCK FLUSH 100 UNIT/ML IV SOLN
500.0000 [IU] | Freq: Once | INTRAVENOUS | Status: AC
  Administered 2023-03-14: 500 [IU]

## 2023-03-21 ENCOUNTER — Telehealth: Payer: Self-pay | Admitting: Hematology and Oncology

## 2023-03-21 NOTE — Telephone Encounter (Signed)
Scheduled appointments per WQ. Patient is aware of all made appointments. 

## 2023-03-27 MED FILL — Dexamethasone Sodium Phosphate Inj 100 MG/10ML: INTRAMUSCULAR | Qty: 1 | Status: AC

## 2023-03-28 ENCOUNTER — Inpatient Hospital Stay: Payer: BC Managed Care – PPO | Attending: Hematology and Oncology

## 2023-03-28 ENCOUNTER — Inpatient Hospital Stay: Payer: BC Managed Care – PPO | Admitting: Hematology and Oncology

## 2023-03-28 ENCOUNTER — Inpatient Hospital Stay: Payer: BC Managed Care – PPO

## 2023-03-28 ENCOUNTER — Other Ambulatory Visit: Payer: Self-pay

## 2023-03-28 VITALS — BP 159/92 | HR 84 | Temp 97.2°F | Resp 18 | Ht 65.0 in | Wt 168.5 lb

## 2023-03-28 VITALS — BP 150/99 | HR 77 | Resp 16

## 2023-03-28 DIAGNOSIS — K769 Liver disease, unspecified: Secondary | ICD-10-CM

## 2023-03-28 DIAGNOSIS — D0511 Intraductal carcinoma in situ of right breast: Secondary | ICD-10-CM | POA: Insufficient documentation

## 2023-03-28 DIAGNOSIS — C50919 Malignant neoplasm of unspecified site of unspecified female breast: Secondary | ICD-10-CM

## 2023-03-28 DIAGNOSIS — C787 Secondary malignant neoplasm of liver and intrahepatic bile duct: Secondary | ICD-10-CM

## 2023-03-28 DIAGNOSIS — Z95828 Presence of other vascular implants and grafts: Secondary | ICD-10-CM

## 2023-03-28 DIAGNOSIS — Z79899 Other long term (current) drug therapy: Secondary | ICD-10-CM | POA: Insufficient documentation

## 2023-03-28 DIAGNOSIS — Z5112 Encounter for antineoplastic immunotherapy: Secondary | ICD-10-CM | POA: Insufficient documentation

## 2023-03-28 LAB — CMP (CANCER CENTER ONLY)
ALT: 13 U/L (ref 0–44)
AST: 12 U/L — ABNORMAL LOW (ref 15–41)
Albumin: 4.3 g/dL (ref 3.5–5.0)
Alkaline Phosphatase: 52 U/L (ref 38–126)
Anion gap: 6 (ref 5–15)
BUN: 19 mg/dL (ref 6–20)
CO2: 26 mmol/L (ref 22–32)
Calcium: 9 mg/dL (ref 8.9–10.3)
Chloride: 109 mmol/L (ref 98–111)
Creatinine: 0.7 mg/dL (ref 0.44–1.00)
GFR, Estimated: >60 mL/min (ref >60)
Glucose, Bld: 101 mg/dL — ABNORMAL HIGH (ref 70–99)
Potassium: 3.5 mmol/L (ref 3.5–5.1)
Sodium: 141 mmol/L (ref 135–145)
Total Bilirubin: 0.3 mg/dL (ref 0.3–1.2)
Total Protein: 6.9 g/dL (ref 6.5–8.1)

## 2023-03-28 LAB — CBC WITH DIFFERENTIAL (CANCER CENTER ONLY)
Abs Immature Granulocytes: 0.06 10*3/uL (ref 0.00–0.07)
Basophils Absolute: 0.1 10*3/uL (ref 0.0–0.1)
Basophils Relative: 1 %
Eosinophils Absolute: 0.1 10*3/uL (ref 0.0–0.5)
Eosinophils Relative: 1 %
HCT: 36.4 % (ref 36.0–46.0)
Hemoglobin: 11.8 g/dL — ABNORMAL LOW (ref 12.0–15.0)
Immature Granulocytes: 1 %
Lymphocytes Relative: 29 %
Lymphs Abs: 2.9 10*3/uL (ref 0.7–4.0)
MCH: 28.2 pg (ref 26.0–34.0)
MCHC: 32.4 g/dL (ref 30.0–36.0)
MCV: 86.9 fL (ref 80.0–100.0)
Monocytes Absolute: 0.9 10*3/uL (ref 0.1–1.0)
Monocytes Relative: 9 %
Neutro Abs: 5.9 10*3/uL (ref 1.7–7.7)
Neutrophils Relative %: 59 %
Platelet Count: 257 10*3/uL (ref 150–400)
RBC: 4.19 MIL/uL (ref 3.87–5.11)
RDW: 14.1 % (ref 11.5–15.5)
WBC Count: 9.8 10*3/uL (ref 4.0–10.5)
nRBC: 0 % (ref 0.0–0.2)

## 2023-03-28 LAB — PREGNANCY, URINE: Preg Test, Ur: NEGATIVE

## 2023-03-28 MED ORDER — DEXAMETHASONE SODIUM PHOSPHATE 10 MG/ML IJ SOLN
4.0000 mg | Freq: Once | INTRAMUSCULAR | Status: AC
  Administered 2023-03-28: 4 mg via INTRAVENOUS
  Filled 2023-03-28: qty 1

## 2023-03-28 MED ORDER — SODIUM CHLORIDE 0.9 % IV SOLN
420.0000 mg | Freq: Once | INTRAVENOUS | Status: AC
  Administered 2023-03-28: 420 mg via INTRAVENOUS
  Filled 2023-03-28: qty 14

## 2023-03-28 MED ORDER — SODIUM CHLORIDE 0.9 % IV SOLN: Freq: Once | INTRAVENOUS | Status: AC

## 2023-03-28 MED ORDER — TRASTUZUMAB-ANNS CHEMO 150 MG IV SOLR
6.0000 mg/kg | Freq: Once | INTRAVENOUS | Status: AC
  Administered 2023-03-28: 420 mg via INTRAVENOUS
  Filled 2023-03-28: qty 20

## 2023-03-28 MED ORDER — SODIUM CHLORIDE 0.9 % IV SOLN
65.0000 mg/m2 | Freq: Once | INTRAVENOUS | Status: AC
  Administered 2023-03-28: 121 mg via INTRAVENOUS
  Filled 2023-03-28: qty 12.1

## 2023-03-28 MED ORDER — SODIUM CHLORIDE 0.9% FLUSH
10.0000 mL | INTRAVENOUS | Status: DC | PRN
  Administered 2023-03-28: 10 mL

## 2023-03-28 MED ORDER — SODIUM CHLORIDE 0.9% FLUSH
10.0000 mL | Freq: Once | INTRAVENOUS | Status: AC
  Administered 2023-03-28: 10 mL

## 2023-03-28 MED ORDER — HEPARIN SOD (PORK) LOCK FLUSH 100 UNIT/ML IV SOLN
500.0000 [IU] | Freq: Once | INTRAVENOUS | Status: AC | PRN
  Administered 2023-03-28: 500 [IU]

## 2023-03-28 MED ORDER — DIPHENHYDRAMINE HCL 25 MG PO CAPS
50.0000 mg | ORAL_CAPSULE | Freq: Once | ORAL | Status: AC
  Administered 2023-03-28: 50 mg via ORAL
  Filled 2023-03-28: qty 2

## 2023-03-28 MED ORDER — ACETAMINOPHEN 325 MG PO TABS
650.0000 mg | ORAL_TABLET | Freq: Once | ORAL | Status: AC
  Administered 2023-03-28: 650 mg via ORAL
  Filled 2023-03-28: qty 2

## 2023-03-28 MED ORDER — DEXAMETHASONE SODIUM PHOSPHATE 100 MG/10ML IJ SOLN
4.0000 mg | Freq: Once | INTRAMUSCULAR | Status: DC
Start: 2023-03-28 — End: 2023-03-28

## 2023-03-28 NOTE — Assessment & Plan Note (Addendum)
02/11/19: RT lumpectomy: DICS Intermediate grade, Margins Neg, ER 90%, PR 70%; Lt Lumpectomy: UDH status post radiation, could not tolerate tamoxifen recurrence: 08/15/2021:MRI surveillance detected Right breast calcifications 5 cm, Ant biopsy: IG DCIS with necrosis, ER 40%, PR 0%, Posterior biopsy: Intermediate grade DCIS with [facility]  11/29/2021: Right mastectomy: High-grade DCIS with microinvasion, ER 0%, PR 0%, HER2 positive, 0/1 lymph node negative  T1c MIC, N0: Stage Ia   Patient had second opinion at [facility] with Dr. Creig Hines who agreed with her plan of no systemic chemotherapy   Breast cancer surveillance: 1.  Mammogram 08/22/2022: Left breast benign breast density category B  2.  CT CAP 12/09/2022: Multiple liver lesions indicative of cysts and hemangiomas new right hepatic lobe lesion favored to be hemangioma otherwise no evidence of metastatic disease.  Radiology recommended an MRI of the liver. 3.  Liver MRI 01/08/2023: 2 New rim-enhancing lesions 2.3 cm (was 1.6 cm on 12/09/2022), 1.8 cm (measured 1.5 cm), benign hemangioma 2.4 cm and 1.9 cm and 1.3 cm unchanged 4.  PET CT scan 01/22/2023: Liver: 2.5 cm and 2.4 cm lesions ------------------------------------------------------------------------------------------------------------------------------------------------- 01/31/2023: Liver lesion biopsy: Metastatic adenocarcinoma to the liver consistent with breast primary positive for CK7 and GATA3, ER 0%, PR 0%, Ki-67 25%, HER2 3+ positive, [facility] molecular testing is pending   Current treatment: Taxotere Herceptin Perjeta cycle 3 Chemo toxicities: Muscle cramps: Due to hypokalemia: Replaced, no further issues Mouth sores: Resolved Taste changes Alopecia  She did very well with the reduced dosage of Taxotere. Steroids given her facial puffiness.  We might consider reducing the oral steroids that she takes.  I reduced the dosage of IV steroid today.  Continue with IV fluids 1 week after chemotherapy  for hydration.   She saw [facility]. Roland Earl at [facility] for second opinion who agreed with the treatment plan.   Meningioma: Follows with Dr. Barbaraann Cao  Return to clinic for cycle 4 with CT scans done prior to the visit

## 2023-03-28 NOTE — Patient Instructions (Signed)
[facility]  Discharge Instructions: Thank you for choosing [facility] to provide your oncology and hematology care.   If you have a lab appointment with the Cancer Center, please go directly to the Cancer Center and check in at the registration area.   Wear comfortable clothing and clothing appropriate for easy access to any Portacath or PICC line.   We strive to give you quality time with your provider. You may need to reschedule your appointment if you arrive late (15 or more minutes).  Arriving late affects you and other patients whose appointments are after yours.  Also, if you miss three or more appointments without notifying the office, you may be dismissed from the clinic at the provider's discretion.      For prescription refill requests, have your pharmacy contact our office and allow 72 hours for refills to be completed.    Today you received the following chemotherapy and/or immunotherapy agents: trastuzumab, pertuzumab, and docetaxel      To help prevent nausea and vomiting after your treatment, we encourage you to take your nausea medication as directed.  BELOW ARE SYMPTOMS THAT SHOULD BE REPORTED IMMEDIATELY: *FEVER GREATER THAN 100.4 F (38 C) OR HIGHER *CHILLS OR SWEATING *NAUSEA AND VOMITING THAT IS NOT CONTROLLED WITH YOUR NAUSEA MEDICATION *UNUSUAL SHORTNESS OF BREATH *UNUSUAL BRUISING OR BLEEDING *URINARY PROBLEMS (pain or burning when urinating, or frequent urination) *BOWEL PROBLEMS (unusual diarrhea, constipation, pain near the anus) TENDERNESS IN MOUTH AND THROAT WITH OR WITHOUT PRESENCE OF ULCERS (sore throat, sores in mouth, or a toothache) UNUSUAL RASH, SWELLING OR PAIN  UNUSUAL VAGINAL DISCHARGE OR ITCHING   Items with * indicate a potential emergency and should be followed up as soon as possible or go to the Emergency Department if any problems should occur.  Please show the CHEMOTHERAPY ALERT CARD or  IMMUNOTHERAPY ALERT CARD at check-in to the Emergency Department and triage nurse.  Should you have questions after your visit or need to cancel or reschedule your appointment, please contact [facility]  Dept: [phone]  and follow the prompts.  Office hours are 8:00 a.m. to 4:30 p.m. Monday - Friday. Please note that voicemails left after 4:00 p.m. may not be returned until the following business day.  We are closed weekends and major holidays. You have access to a nurse at all times for urgent questions. Please call the main number to the clinic Dept: [phone] and follow the prompts.   For any non-urgent questions, you may also contact your provider using MyChart. We now offer e-Visits for anyone 18 and older to request care online for non-urgent symptoms. For details visit [email or web address].   Also download the MyChart app! Go to the app store, search "MyChart", open the app, select [facility], and log in with your MyChart username and password.   

## 2023-03-28 NOTE — Progress Notes (Signed)
Patient Care Team: [name], MD as PCP - General (Family Medicine) Donnelly Angelica, RN as Oncology Nurse Navigator Pershing Proud, RN as Oncology Nurse Navigator Serena Croissant, MD as Consulting Physician (Hematology and Oncology) Default, Provider, MD as Technician  DIAGNOSIS:  Encounter Diagnosis  Name Primary?   Carcinoma of breast metastatic to liver, unspecified laterality (HCC) Yes    SUMMARY OF ONCOLOGIC HISTORY: Oncology History  Breast cancer metastasized to liver (HCC)  01/25/2019 Initial Diagnosis   Screening mammogram showed bilateral calcifications right breast 5 mm, pleomorphic.  Left breast 1.6 cm punctate, smaller group of calcifications spanning 3 mm.  Right lower outer quadrant biopsy revealed low-grade DCIS ER 90%, PR 70%; biopsy of left breast calcifications ALH and PASH   01/31/2019 Genetic Testing   ATM c.8495G>A and RECQL4 c.2587G>A VUS identified on the 9-gene STAT panel and Multi-cancer panel through [facility].  The STAT Breast cancer panel offered by [facility] includes sequencing and rearrangement analysis for the following 9 genes:  ATM, BRCA1, BRCA2, CDH1, CHEK2, PALB2, PTEN, STK11 and TP53.   The Multi-Gene Panel offered by [facility] includes sequencing and/or deletion duplication testing of the following 84 genes: AIP, ALK, APC, ATM, AXIN2,BAP1,  BARD1, BLM, BMPR1A, BRCA1, BRCA2, BRIP1, CASR, CDC73, CDH1, CDK4, CDKN1B, CDKN1C, CDKN2A (p14ARF), CDKN2A (p16INK4a), CEBPA, CHEK2, CTNNA1, DICER1, DIS3L2, EGFR (c.2369C>T, p.Thr790Met variant only), EPCAM (Deletion/duplication testing only), FH, FLCN, GATA2, GPC3, GREM1 (Promoter region deletion/duplication testing only), HOXB13 (c.251G>A, p.Gly84Glu), HRAS, KIT, MAX, MEN1, MET, MITF (c.952G>A, p.Glu318Lys variant only), MLH1, MSH2, MSH3, MSH6, MUTYH, NBN, NF1, NF2, NTHL1, PALB2, PDGFRA, PHOX2B, PMS2, POLD1, POLE, POT1, PRKAR1A, PTCH1, PTEN, RAD50, RAD51C, RAD51D, RB1, RECQL4, RET, RUNX1, SDHAF2, SDHA (sequence changes  only), SDHB, SDHC, SDHD, SMAD4, SMARCA4, SMARCB1, SMARCE1, STK11, SUFU, TERC, TERT, TMEM127, TP53, TSC1, TSC2, VHL, WRN and WT1.  he report date is February 02, 2019.   02/11/2019 Surgery   RT lumpectomy: DICS Intermediate grade, Margins Neg, ER 90%, PR 70%; Lt Lumpectomy: UDH   02/26/2019 -  Anti-estrogen oral therapy   Tamoxifen 20mg  daily, stopped because of depression and facial numbness   05/20/2019 - 06/17/2019 Radiation Therapy   Adjuvant XRT   08/15/2021 Relapse/Recurrence   MRI surveillance detected Right breast calcifications 5 cm, Ant biopsy: IG DCIS with necrosis, ER 40%, PR 0%, Posterior biopsy: Intermediate grade DCIS with Joann Meadows   08/19/2022 Cancer Staging   Staging form: Breast, AJCC 8th Edition - Clinical: Stage IV (cT21mi, cNX, cM1, G3, ER: Unknown, PR: Unknown, HER2: Unknown) - Signed by Serena Croissant, MD on 02/06/2023 Histologic grading system: 3 grade system   02/14/2023 -  Chemotherapy   Patient is on Treatment Plan : BREAST DOCEtaxel + Trastuzumab + Pertuzumab (THP) q21d x 8 cycles / Trastuzumab + Pertuzumab q21d x 4 cycles     AML (acute myeloid leukemia) (HCC)  09/1998 Initial Diagnosis   AML (acute myeloid leukemia) (HCC) treated with 2 induction regimens followed by 6 consolidation regimens, remission     CHIEF COMPLIANT: Taxotere Herceptin Perjeta cycle 3     INTERVAL HISTORY: Joann Meadows is a 47 y.o. with above-mentioned history of right breast DCIS for which she underwent a lumpectomy. She presents to the clinic today for a follow-up. She states that she was more tired after the treatment.  She complains of stiffness in hands and feet. She always have a runny nose.  ALLERGIES:  is allergic to tamoxifen citrate and tape.  MEDICATIONS:  Current Outpatient Medications  Medication Sig Dispense Refill  ALPRAZolam (XANAX) 0.25 MG tablet 1 tablet Orally once a day if needed for anxiety/panic (not a daily medication, avoid regular use) for 30 days     cephALEXin  (KEFLEX) 500 MG capsule Take 1 capsule (500 mg total) by mouth 2 (two) times daily. 10 capsule 0   cholecalciferol (VITAMIN D3) 25 MCG (1000 UNIT) tablet Take 2,000 Units by mouth daily.     hydrochlorothiazide (HYDRODIURIL) 25 MG tablet TAKE 1 TABLET BY MOUTH EVERY DAY IN THE MORNING FOR 30 DAYS ONCE A DAY     magic mouthwash w/lidocaine SOLN Take 5 mLs by mouth 4 (four) times daily as needed for mouth pain. 240 mL 2   olmesartan (BENICAR) 5 MG tablet Take by mouth.     ondansetron (ZOFRAN) 8 MG tablet Take 1 tablet (8 mg total) by mouth every 8 (eight) hours as needed for nausea or vomiting. 30 tablet 1   oxyCODONE-acetaminophen (PERCOCET/ROXICET) 5-325 MG tablet Take 1 tablet by mouth every 6 (six) hours as needed for severe pain. 15 tablet 0   potassium chloride SA (KLOR-CON M) 20 MEQ tablet Take 1 tablet (20 mEq total) by mouth daily. 30 tablet 3   No current facility-administered medications for this visit.   Facility-Administered Medications Ordered in Other Visits  Medication Dose Route Frequency Provider Last Rate Last Admin   acetaminophen (TYLENOL) tablet 650 mg  650 mg Oral Once Serena Croissant, MD       dexamethasone (DECADRON) 4 mg in sodium chloride 0.9 % 50 mL IVPB  4 mg Intravenous Once Serena Croissant, MD       diphenhydrAMINE (BENADRYL) capsule 50 mg  50 mg Oral Once Serena Croissant, MD       DOCEtaxel (TAXOTERE) 121 mg in sodium chloride 0.9 % 250 mL chemo infusion  65 mg/m2 (Treatment Plan Recorded) Intravenous Once Serena Croissant, MD       heparin lock flush 100 unit/mL  500 Units Intracatheter Once PRN Serena Croissant, MD       pertuzumab (PERJETA) 420 mg in sodium chloride 0.9 % 250 mL chemo infusion  420 mg Intravenous Once Serena Croissant, MD       sodium chloride flush (NS) 0.9 % injection 10 mL  10 mL Intracatheter PRN Serena Croissant, MD       trastuzumab-anns (KANJINTI) 420 mg in sodium chloride 0.9 % 250 mL chemo infusion  6 mg/kg (Treatment Plan Recorded) Intravenous Once  Serena Croissant, MD        PHYSICAL EXAMINATION: ECOG PERFORMANCE STATUS: 1 - Symptomatic but completely ambulatory  Vitals:   03/28/23 1056  BP: (!) 159/92  Pulse: 84  Resp: 18  Temp: (!) 97.2 F (36.2 C)  SpO2: 100%   Filed Weights   03/28/23 1056  Weight: 168 lb 8 oz (76.4 kg)      LABORATORY DATA:  I have reviewed the data as listed    Latest Ref Rng & Units 03/28/2023   10:30 AM 03/10/2023   10:44 AM 03/07/2023   11:00 AM  CMP  Glucose 70 - 99 mg/dL 161  94  96   BUN 6 - 20 mg/dL 19  16  16    Creatinine 0.44 - 1.00 mg/dL 0.96  0.45  4.09   Sodium 135 - 145 mmol/L 141  138  140   Potassium 3.5 - 5.1 mmol/L 3.5  3.7  3.1   Chloride 98 - 111 mmol/L 109  106  106   CO2 22 - 32 mmol/L 26  27  27   Calcium 8.9 - 10.3 mg/dL 9.0  8.8  9.2   Total Protein 6.5 - 8.1 g/dL 6.9  6.7  7.3   Total Bilirubin 0.3 - 1.2 mg/dL 0.3  0.3  0.3   Alkaline Phos 38 - 126 U/L 52  50  60   AST 15 - 41 U/L 12  11  12    ALT 0 - 44 U/L 13  13  15      Lab Results  Component Value Date   WBC 9.8 03/28/2023   HGB 11.8 (L) 03/28/2023   HCT 36.4 03/28/2023   MCV 86.9 03/28/2023   PLT 257 03/28/2023   NEUTROABS 5.9 03/28/2023    ASSESSMENT & PLAN:  Breast cancer metastasized to liver (HCC) 02/11/19: RT lumpectomy: DICS Intermediate grade, Margins Neg, ER 90%, PR 70%; Lt Lumpectomy: UDH status post radiation, could not tolerate tamoxifen recurrence: 08/15/2021:MRI surveillance detected Right breast calcifications 5 cm, Ant biopsy: IG DCIS with necrosis, ER 40%, PR 0%, Posterior biopsy: Intermediate grade DCIS with [facility]  11/29/2021: Right mastectomy: High-grade DCIS with microinvasion, ER 0%, PR 0%, HER2 positive, 0/1 lymph node negative  T1c MIC, N0: Stage Ia   Patient had second opinion at [facility] with Dr. Creig Hines who agreed with her plan of no systemic chemotherapy   Breast cancer surveillance: 1.  Mammogram 08/22/2022: Left breast benign breast density category B  2.  CT CAP 12/09/2022:  Multiple liver lesions indicative of cysts and hemangiomas new right hepatic lobe lesion favored to be hemangioma otherwise no evidence of metastatic disease.  Radiology recommended an MRI of the liver. 3.  Liver MRI 01/08/2023: 2 New rim-enhancing lesions 2.3 cm (was 1.6 cm on 12/09/2022), 1.8 cm (measured 1.5 cm), benign hemangioma 2.4 cm and 1.9 cm and 1.3 cm unchanged 4.  PET CT scan 01/22/2023: Liver: 2.5 cm and 2.4 cm lesions ------------------------------------------------------------------------------------------------------------------------------------------------- 01/31/2023: Liver lesion biopsy: Metastatic adenocarcinoma to the liver consistent with breast primary positive for CK7 and GATA3, ER 0%, PR 0%, Ki-67 25%, HER2 3+ positive, Caris molecular testing is pending   Current treatment: Taxotere Herceptin Perjeta cycle 3 Chemo toxicities: Muscle cramps: Due to hypokalemia: Replaced, no further issues Mouth sores: Resolved Taste changes Alopecia  She did very well with the reduced dosage of Taxotere. Steroids given her facial puffiness.  We might consider reducing the oral steroids that she takes.  I reduced the dosage of IV steroid today.  Continue with IV fluids 1 week after chemotherapy for hydration.   She saw Dr. Roland Earl at [facility] for second opinion who agreed with the treatment plan.   Meningioma: Follows with Dr. Barbaraann Cao  Return to clinic for cycle 4 with CT scans done prior to the visit    Orders Placed This Encounter  Procedures   CT CHEST ABDOMEN PELVIS W CONTRAST    Standing Status:   Future    Standing Expiration Date:   03/27/2024    Order Specific Question:   If indicated for the ordered procedure, I authorize the administration of contrast media per Radiology protocol    Answer:   Yes    Order Specific Question:   Does the patient have a contrast media/X-ray dye allergy?    Answer:   No    Order Specific Question:   Is patient pregnant?    Answer:   No     Order Specific Question:   Preferred imaging location?    Answer:   [facility]  Order Specific Question:   If indicated for the ordered procedure, I authorize the administration of oral contrast media per Radiology protocol    Answer:   Yes   The patient has a good understanding of the overall plan. she agrees with it. she will call with any problems that may develop before the next visit here. Total time spent: 30 mins including face to face time and time spent for planning, charting and co-ordination of care   Tamsen Meek, MD 03/28/23    I Janan Ridge am acting as a Neurosurgeon for [employer]  I have reviewed the above documentation for accuracy and completeness, and I agree with the above.

## 2023-04-04 ENCOUNTER — Inpatient Hospital Stay: Payer: BC Managed Care – PPO

## 2023-04-04 ENCOUNTER — Other Ambulatory Visit: Payer: Self-pay

## 2023-04-04 VITALS — BP 149/88 | HR 77 | Temp 98.2°F | Resp 18

## 2023-04-04 DIAGNOSIS — Z95828 Presence of other vascular implants and grafts: Secondary | ICD-10-CM

## 2023-04-04 DIAGNOSIS — Z5112 Encounter for antineoplastic immunotherapy: Secondary | ICD-10-CM | POA: Diagnosis not present

## 2023-04-04 DIAGNOSIS — C787 Secondary malignant neoplasm of liver and intrahepatic bile duct: Secondary | ICD-10-CM | POA: Diagnosis not present

## 2023-04-04 DIAGNOSIS — Z79899 Other long term (current) drug therapy: Secondary | ICD-10-CM | POA: Diagnosis not present

## 2023-04-04 DIAGNOSIS — D0511 Intraductal carcinoma in situ of right breast: Secondary | ICD-10-CM | POA: Diagnosis not present

## 2023-04-04 DIAGNOSIS — K769 Liver disease, unspecified: Secondary | ICD-10-CM

## 2023-04-04 MED ORDER — SODIUM CHLORIDE 0.9 % IV SOLN: Freq: Once | INTRAVENOUS | Status: AC

## 2023-04-07 ENCOUNTER — Telehealth: Payer: Self-pay | Admitting: Hematology and Oncology

## 2023-04-07 NOTE — Telephone Encounter (Signed)
Rescheduled appointment per provider PAL. Patient is aware of the changes made to her upcoming appointments. 

## 2023-04-14 ENCOUNTER — Telehealth: Payer: Self-pay | Admitting: Hematology and Oncology

## 2023-04-14 NOTE — Telephone Encounter (Signed)
Rescheduled appointments per 5/2 staff message. Patient is aware of the changes made to her upcoming appointments.

## 2023-04-15 ENCOUNTER — Ambulatory Visit (HOSPITAL_COMMUNITY)
Admission: RE | Admit: 2023-04-15 | Discharge: 2023-04-15 | Disposition: A | Discharge status: Home / Self Care | Payer: BC Managed Care – PPO | Source: Ambulatory Visit | Attending: Hematology and Oncology | Admitting: Hematology and Oncology

## 2023-04-15 ENCOUNTER — Telehealth: Payer: Self-pay | Admitting: *Deleted

## 2023-04-15 DIAGNOSIS — C787 Secondary malignant neoplasm of liver and intrahepatic bile duct: Secondary | ICD-10-CM | POA: Diagnosis not present

## 2023-04-15 DIAGNOSIS — N281 Cyst of kidney, acquired: Secondary | ICD-10-CM | POA: Diagnosis not present

## 2023-04-15 DIAGNOSIS — R918 Other nonspecific abnormal finding of lung field: Secondary | ICD-10-CM | POA: Diagnosis not present

## 2023-04-15 DIAGNOSIS — C50919 Malignant neoplasm of unspecified site of unspecified female breast: Secondary | ICD-10-CM

## 2023-04-15 DIAGNOSIS — K7689 Other specified diseases of liver: Secondary | ICD-10-CM | POA: Diagnosis not present

## 2023-04-15 MED ORDER — IOHEXOL 300 MG/ML  SOLN
100.0000 mL | Freq: Once | INTRAMUSCULAR | Status: AC | PRN
  Administered 2023-04-15: 100 mL via INTRAVENOUS

## 2023-04-15 NOTE — Telephone Encounter (Signed)
Received call from pt with complaint of sore throat x several days and covid negative at this time.  Pt requesting advice from MD on OTC therapies.  Per MD pt to take OTC tylenol as well as Mucinex DM.  Pt educated and verbalized understanding.

## 2023-04-15 NOTE — Progress Notes (Signed)
Patient Care Team: [name], MD as PCP - General (Family Medicine) Donnelly Angelica, RN as Oncology Nurse Navigator Pershing Proud, RN as Oncology Nurse Navigator Serena Croissant, MD as Consulting Physician (Hematology and Oncology) Default, Provider, MD as Technician  DIAGNOSIS: No diagnosis found.  SUMMARY OF ONCOLOGIC HISTORY: Oncology History  Breast cancer metastasized to liver (HCC)  01/25/2019 Initial Diagnosis   Screening mammogram showed bilateral calcifications right breast 5 mm, pleomorphic.  Left breast 1.6 cm punctate, smaller group of calcifications spanning 3 mm.  Right lower outer quadrant biopsy revealed low-grade DCIS ER 90%, PR 70%; biopsy of left breast calcifications ALH and PASH   01/31/2019 Genetic Testing   ATM c.8495G>A and RECQL4 c.2587G>A VUS identified on the 9-gene STAT panel and Multi-cancer panel through [facility].  The STAT Breast cancer panel offered by [facility] includes sequencing and rearrangement analysis for the following 9 genes:  ATM, BRCA1, BRCA2, CDH1, CHEK2, PALB2, PTEN, STK11 and TP53.   The Multi-Gene Panel offered by [facility] includes sequencing and/or deletion duplication testing of the following 84 genes: AIP, ALK, APC, ATM, AXIN2,BAP1,  BARD1, BLM, BMPR1A, BRCA1, BRCA2, BRIP1, CASR, CDC73, CDH1, CDK4, CDKN1B, CDKN1C, CDKN2A (p14ARF), CDKN2A (p16INK4a), CEBPA, CHEK2, CTNNA1, DICER1, DIS3L2, EGFR (c.2369C>T, p.Thr790Met variant only), EPCAM (Deletion/duplication testing only), FH, FLCN, GATA2, GPC3, GREM1 (Promoter region deletion/duplication testing only), HOXB13 (c.251G>A, p.Gly84Glu), HRAS, KIT, MAX, MEN1, MET, MITF (c.952G>A, p.Glu318Lys variant only), MLH1, MSH2, MSH3, MSH6, MUTYH, NBN, NF1, NF2, NTHL1, PALB2, PDGFRA, PHOX2B, PMS2, POLD1, POLE, POT1, PRKAR1A, PTCH1, PTEN, RAD50, RAD51C, RAD51D, RB1, RECQL4, RET, RUNX1, SDHAF2, SDHA (sequence changes only), SDHB, SDHC, SDHD, SMAD4, SMARCA4, SMARCB1, SMARCE1, STK11, SUFU, TERC, TERT, TMEM127, TP53,  TSC1, TSC2, VHL, WRN and WT1.  he report date is February 02, 2019.   02/11/2019 Surgery   RT lumpectomy: DICS Intermediate grade, Margins Neg, ER 90%, PR 70%; Lt Lumpectomy: UDH   02/26/2019 -  Anti-estrogen oral therapy   Tamoxifen 20mg  daily, stopped because of depression and facial numbness   05/20/2019 - 06/17/2019 Radiation Therapy   Adjuvant XRT   08/15/2021 Relapse/Recurrence   MRI surveillance detected Right breast calcifications 5 cm, Ant biopsy: IG DCIS with necrosis, ER 40%, PR 0%, Posterior biopsy: Intermediate grade DCIS with [facility]   08/19/2022 Cancer Staging   Staging form: Breast, AJCC 8th Edition - Clinical: Stage IV (cT45mi, cNX, cM1, G3, ER: Unknown, PR: Unknown, HER2: Unknown) - Signed by Serena Croissant, MD on 02/06/2023 Histologic grading system: 3 grade system   02/14/2023 -  Chemotherapy   Patient is on Treatment Plan : BREAST DOCEtaxel + Trastuzumab + Pertuzumab (THP) q21d x 8 cycles / Trastuzumab + Pertuzumab q21d x 4 cycles     AML (acute myeloid leukemia) (HCC)  09/1998 Initial Diagnosis   AML (acute myeloid leukemia) (HCC) treated with 2 induction regimens followed by 6 consolidation regimens, remission     CHIEF COMPLIANT: Taxotere Herceptin Perjeta cycle 4  INTERVAL HISTORY: Joann Meadows is a 47 y.o. with above-mentioned history of right breast DCIS for which she underwent a lumpectomy. She presents to the clinic today for a follow-up.    ALLERGIES:  is allergic to tamoxifen citrate and tape.  MEDICATIONS:  Current Outpatient Medications  Medication Sig Dispense Refill   ALPRAZolam (XANAX) 0.25 MG tablet 1 tablet Orally once a day if needed for anxiety/panic (not a daily medication, avoid regular use) for 30 days     cephALEXin (KEFLEX) 500 MG capsule Take 1 capsule (500 mg total) by mouth 2 (  two) times daily. 10 capsule 0   cholecalciferol (VITAMIN D3) 25 MCG (1000 UNIT) tablet Take 2,000 Units by mouth daily.     hydrochlorothiazide (HYDRODIURIL) 25 MG  tablet TAKE 1 TABLET BY MOUTH EVERY DAY IN THE MORNING FOR 30 DAYS ONCE A DAY     magic mouthwash w/lidocaine SOLN Take 5 mLs by mouth 4 (four) times daily as needed for mouth pain. 240 mL 2   olmesartan (BENICAR) 5 MG tablet Take by mouth.     ondansetron (ZOFRAN) 8 MG tablet Take 1 tablet (8 mg total) by mouth every 8 (eight) hours as needed for nausea or vomiting. 30 tablet 1   oxyCODONE-acetaminophen (PERCOCET/ROXICET) 5-325 MG tablet Take 1 tablet by mouth every 6 (six) hours as needed for severe pain. 15 tablet 0   potassium chloride SA (KLOR-CON M) 20 MEQ tablet Take 1 tablet (20 mEq total) by mouth daily. 30 tablet 3   No current facility-administered medications for this visit.    PHYSICAL EXAMINATION: ECOG PERFORMANCE STATUS: {CHL ONC ECOG PS:[id number]}  There were no vitals filed for this visit. There were no vitals filed for this visit.  BREAST:*** No palpable masses or nodules in either right or left breasts. No palpable axillary supraclavicular or infraclavicular adenopathy no breast tenderness or nipple discharge. (exam performed in the presence of a chaperone)  LABORATORY DATA:  I have reviewed the data as listed    Latest Ref Rng & Units 03/28/2023   10:30 AM 03/10/2023   10:44 AM 03/07/2023   11:00 AM  CMP  Glucose 70 - 99 mg/dL 409  94  96   BUN 6 - 20 mg/dL 19  16  16    Creatinine 0.44 - 1.00 mg/dL 8.11  9.14  7.82   Sodium 135 - 145 mmol/L 141  138  140   Potassium 3.5 - 5.1 mmol/L 3.5  3.7  3.1   Chloride 98 - 111 mmol/L 109  106  106   CO2 22 - 32 mmol/L 26  27  27    Calcium 8.9 - 10.3 mg/dL 9.0  8.8  9.2   Total Protein 6.5 - 8.1 g/dL 6.9  6.7  7.3   Total Bilirubin 0.3 - 1.2 mg/dL 0.3  0.3  0.3   Alkaline Phos 38 - 126 U/L 52  50  60   AST 15 - 41 U/L 12  11  12    ALT 0 - 44 U/L 13  13  15      Lab Results  Component Value Date   WBC 9.8 03/28/2023   HGB 11.8 (L) 03/28/2023   HCT 36.4 03/28/2023   MCV 86.9 03/28/2023   PLT 257 03/28/2023    NEUTROABS 5.9 03/28/2023    ASSESSMENT & PLAN:  No problem-specific Assessment & Plan notes found for this encounter.    No orders of the defined types were placed in this encounter.  The patient has a good understanding of the overall plan. she agrees with it. she will call with any problems that may develop before the next visit here. Total time spent: 30 mins including face to face time and time spent for planning, charting and co-ordination of care   Sherlyn Lick, CMA 04/15/23    I Janan Ridge am acting as a Neurosurgeon for [employer]  ***

## 2023-04-17 ENCOUNTER — Other Ambulatory Visit: Payer: Self-pay

## 2023-04-17 ENCOUNTER — Inpatient Hospital Stay: Payer: BC Managed Care – PPO

## 2023-04-17 ENCOUNTER — Inpatient Hospital Stay: Payer: BC Managed Care – PPO | Admitting: Hematology and Oncology

## 2023-04-17 VITALS — BP 132/85 | HR 78 | Temp 97.9°F | Wt 168.3 lb

## 2023-04-17 DIAGNOSIS — C787 Secondary malignant neoplasm of liver and intrahepatic bile duct: Secondary | ICD-10-CM | POA: Diagnosis not present

## 2023-04-17 DIAGNOSIS — D0511 Intraductal carcinoma in situ of right breast: Secondary | ICD-10-CM | POA: Diagnosis not present

## 2023-04-17 DIAGNOSIS — Z5112 Encounter for antineoplastic immunotherapy: Secondary | ICD-10-CM | POA: Diagnosis not present

## 2023-04-17 DIAGNOSIS — Z79899 Other long term (current) drug therapy: Secondary | ICD-10-CM | POA: Diagnosis not present

## 2023-04-17 DIAGNOSIS — C50919 Malignant neoplasm of unspecified site of unspecified female breast: Secondary | ICD-10-CM | POA: Diagnosis not present

## 2023-04-17 LAB — CBC WITH DIFFERENTIAL (CANCER CENTER ONLY)
Abs Immature Granulocytes: 0.08 10*3/uL — ABNORMAL HIGH (ref 0.00–0.07)
Basophils Absolute: 0.1 10*3/uL (ref 0.0–0.1)
Basophils Relative: 1 %
Eosinophils Absolute: 0 10*3/uL (ref 0.0–0.5)
Eosinophils Relative: 0 %
HCT: 37.9 % (ref 36.0–46.0)
Hemoglobin: 12.4 g/dL (ref 12.0–15.0)
Immature Granulocytes: 1 %
Lymphocytes Relative: 16 %
Lymphs Abs: 2.1 10*3/uL (ref 0.7–4.0)
MCH: 28.4 pg (ref 26.0–34.0)
MCHC: 32.7 g/dL (ref 30.0–36.0)
MCV: 86.9 fL (ref 80.0–100.0)
Monocytes Absolute: 1.1 10*3/uL — ABNORMAL HIGH (ref 0.1–1.0)
Monocytes Relative: 8 %
Neutro Abs: 9.5 10*3/uL — ABNORMAL HIGH (ref 1.7–7.7)
Neutrophils Relative %: 74 %
Platelet Count: 222 10*3/uL (ref 150–400)
RBC: 4.36 MIL/uL (ref 3.87–5.11)
RDW: 14.6 % (ref 11.5–15.5)
WBC Count: 12.8 10*3/uL — ABNORMAL HIGH (ref 4.0–10.5)
nRBC: 0 % (ref 0.0–0.2)

## 2023-04-17 LAB — CMP (CANCER CENTER ONLY)
ALT: 11 U/L (ref 0–44)
AST: 11 U/L — ABNORMAL LOW (ref 15–41)
Albumin: 4.2 g/dL (ref 3.5–5.0)
Alkaline Phosphatase: 56 U/L (ref 38–126)
Anion gap: 6 (ref 5–15)
BUN: 11 mg/dL (ref 6–20)
CO2: 28 mmol/L (ref 22–32)
Calcium: 9.2 mg/dL (ref 8.9–10.3)
Chloride: 107 mmol/L (ref 98–111)
Creatinine: 0.8 mg/dL (ref 0.44–1.00)
GFR, Estimated: >60 mL/min (ref >60)
Glucose, Bld: 100 mg/dL — ABNORMAL HIGH (ref 70–99)
Potassium: 4.4 mmol/L (ref 3.5–5.1)
Sodium: 141 mmol/L (ref 135–145)
Total Bilirubin: 0.4 mg/dL (ref 0.3–1.2)
Total Protein: 7 g/dL (ref 6.5–8.1)

## 2023-04-17 LAB — PREGNANCY, URINE: Preg Test, Ur: NEGATIVE

## 2023-04-17 MED ORDER — AZITHROMYCIN 250 MG PO TABS: ORAL_TABLET | ORAL | 0 refills | Status: DC

## 2023-04-17 NOTE — Assessment & Plan Note (Signed)
02/11/19: RT lumpectomy: DICS Intermediate grade, Margins Neg, ER 90%, PR 70%; Lt Lumpectomy: UDH status post radiation, could not tolerate tamoxifen recurrence: 08/15/2021:MRI surveillance detected Right breast calcifications 5 cm, Ant biopsy: IG DCIS with necrosis, ER 40%, PR 0%, Posterior biopsy: Intermediate grade DCIS with [facility]  11/29/2021: Right mastectomy: High-grade DCIS with microinvasion, ER 0%, PR 0%, HER2 positive, 0/1 lymph node negative  T1c MIC, N0: Stage Ia   Patient had second opinion at [facility] with Dr. Creig Hines who agreed with her plan of no systemic chemotherapy   Breast cancer surveillance: 1.  Mammogram 08/22/2022: Left breast benign breast density category B  2.  CT CAP 12/09/2022: Multiple liver lesions indicative of cysts and hemangiomas new right hepatic lobe lesion favored to be hemangioma otherwise no evidence of metastatic disease.  Radiology recommended an MRI of the liver. 3.  Liver MRI 01/08/2023: 2 New rim-enhancing lesions 2.3 cm (was 1.6 cm on 12/09/2022), 1.8 cm (measured 1.5 cm), benign hemangioma 2.4 cm and 1.9 cm and 1.3 cm unchanged 4.  PET CT scan 01/22/2023: Liver: 2.5 cm and 2.4 cm lesions ------------------------------------------------------------------------------------------------------------------------------------------------- 01/31/2023: Liver lesion biopsy: Metastatic adenocarcinoma to the liver consistent with breast primary positive for CK7 and GATA3, ER 0%, PR 0%, Ki-67 25%, HER2 3+ positive, Caris molecular testing is pending   Current treatment: Taxotere Herceptin Perjeta cycle 3 Chemo toxicities: Muscle cramps: Due to hypokalemia: Replaced, no further issues Mouth sores: Resolved Taste changes Alopecia   She did very well with the reduced dosage of Taxotere. Steroids given her facial puffiness.  We might consider reducing the oral steroids that she takes.  I reduced the dosage of IV steroid today.   Continue with IV fluids 1 week after chemotherapy  for hydration.   She saw Dr. Roland Earl at [facility] for second opinion who agreed with the treatment plan.   Meningioma: Follows with Dr. Barbaraann Cao  CT CAP 04/17/2023: Slight progression of metastatic lesions in the liver 1.6 cm (was 2.3 cm), 1.4 cm (was previously 1.8 cm), previously noted small pulmonary nodules 4 mm or less felt to be benign  Continue with the current treatment and we will complete 6 cycles of Taxotere before going on maintenance therapy

## 2023-04-18 ENCOUNTER — Inpatient Hospital Stay: Payer: BC Managed Care – PPO

## 2023-04-18 ENCOUNTER — Inpatient Hospital Stay: Payer: BC Managed Care – PPO | Admitting: Hematology and Oncology

## 2023-04-18 ENCOUNTER — Other Ambulatory Visit: Payer: BC Managed Care – PPO

## 2023-04-18 VITALS — BP 138/78 | HR 72 | Temp 98.2°F | Resp 18

## 2023-04-18 DIAGNOSIS — C50919 Malignant neoplasm of unspecified site of unspecified female breast: Secondary | ICD-10-CM

## 2023-04-18 DIAGNOSIS — Z5112 Encounter for antineoplastic immunotherapy: Secondary | ICD-10-CM | POA: Diagnosis not present

## 2023-04-18 DIAGNOSIS — Z79899 Other long term (current) drug therapy: Secondary | ICD-10-CM | POA: Diagnosis not present

## 2023-04-18 DIAGNOSIS — D0511 Intraductal carcinoma in situ of right breast: Secondary | ICD-10-CM | POA: Diagnosis not present

## 2023-04-18 DIAGNOSIS — C787 Secondary malignant neoplasm of liver and intrahepatic bile duct: Secondary | ICD-10-CM | POA: Diagnosis not present

## 2023-04-18 MED ORDER — SODIUM CHLORIDE 0.9 % IV SOLN
65.0000 mg/m2 | Freq: Once | INTRAVENOUS | Status: AC
  Administered 2023-04-18: 121 mg via INTRAVENOUS
  Filled 2023-04-18: qty 12.1

## 2023-04-18 MED ORDER — DEXAMETHASONE SODIUM PHOSPHATE 10 MG/ML IJ SOLN
4.0000 mg | Freq: Once | INTRAMUSCULAR | Status: AC
  Administered 2023-04-18: 4 mg via INTRAVENOUS
  Filled 2023-04-18: qty 1

## 2023-04-18 MED ORDER — TRASTUZUMAB-ANNS CHEMO 150 MG IV SOLR
6.0000 mg/kg | Freq: Once | INTRAVENOUS | Status: AC
  Administered 2023-04-18: 420 mg via INTRAVENOUS
  Filled 2023-04-18: qty 20

## 2023-04-18 MED ORDER — DIPHENHYDRAMINE HCL 25 MG PO CAPS
50.0000 mg | ORAL_CAPSULE | Freq: Once | ORAL | Status: AC
  Administered 2023-04-18: 50 mg via ORAL
  Filled 2023-04-18: qty 2

## 2023-04-18 MED ORDER — SODIUM CHLORIDE 0.9 % IV SOLN
420.0000 mg | Freq: Once | INTRAVENOUS | Status: AC
  Administered 2023-04-18: 420 mg via INTRAVENOUS
  Filled 2023-04-18: qty 14

## 2023-04-18 MED ORDER — SODIUM CHLORIDE 0.9 % IV SOLN: Freq: Once | INTRAVENOUS | Status: AC

## 2023-04-18 MED ORDER — ACETAMINOPHEN 325 MG PO TABS
650.0000 mg | ORAL_TABLET | Freq: Once | ORAL | Status: AC
  Administered 2023-04-18: 650 mg via ORAL
  Filled 2023-04-18: qty 2

## 2023-04-18 NOTE — Patient Instructions (Signed)
[facility]  Discharge Instructions: Thank you for choosing [facility] to provide your oncology and hematology care.   If you have a lab appointment with the Cancer Center, please go directly to the Cancer Center and check in at the registration area.   Wear comfortable clothing and clothing appropriate for easy access to any Portacath or PICC line.   We strive to give you quality time with your provider. You may need to reschedule your appointment if you arrive late (15 or more minutes).  Arriving late affects you and other patients whose appointments are after yours.  Also, if you miss three or more appointments without notifying the office, you may be dismissed from the clinic at the provider's discretion.      For prescription refill requests, have your pharmacy contact our office and allow 72 hours for refills to be completed.    Today you received the following chemotherapy and/or immunotherapy agents: trastuzumab, pertuzumab, and docetaxel      To help prevent nausea and vomiting after your treatment, we encourage you to take your nausea medication as directed.  BELOW ARE SYMPTOMS THAT SHOULD BE REPORTED IMMEDIATELY: *FEVER GREATER THAN 100.4 F (38 C) OR HIGHER *CHILLS OR SWEATING *NAUSEA AND VOMITING THAT IS NOT CONTROLLED WITH YOUR NAUSEA MEDICATION *UNUSUAL SHORTNESS OF BREATH *UNUSUAL BRUISING OR BLEEDING *URINARY PROBLEMS (pain or burning when urinating, or frequent urination) *BOWEL PROBLEMS (unusual diarrhea, constipation, pain near the anus) TENDERNESS IN MOUTH AND THROAT WITH OR WITHOUT PRESENCE OF ULCERS (sore throat, sores in mouth, or a toothache) UNUSUAL RASH, SWELLING OR PAIN  UNUSUAL VAGINAL DISCHARGE OR ITCHING   Items with * indicate a potential emergency and should be followed up as soon as possible or go to the Emergency Department if any problems should occur.  Please show the CHEMOTHERAPY ALERT CARD or  IMMUNOTHERAPY ALERT CARD at check-in to the Emergency Department and triage nurse.  Should you have questions after your visit or need to cancel or reschedule your appointment, please contact [facility]  Dept: [phone]  and follow the prompts.  Office hours are 8:00 a.m. to 4:30 p.m. Monday - Friday. Please note that voicemails left after 4:00 p.m. may not be returned until the following business day.  We are closed weekends and major holidays. You have access to a nurse at all times for urgent questions. Please call the main number to the clinic Dept: [phone] and follow the prompts.   For any non-urgent questions, you may also contact your provider using MyChart. We now offer e-Visits for anyone 18 and older to request care online for non-urgent symptoms. For details visit [email or web address].   Also download the MyChart app! Go to the app store, search "MyChart", open the app, select [place], and log in with your MyChart username and password.   

## 2023-04-23 ENCOUNTER — Other Ambulatory Visit: Payer: Self-pay | Admitting: *Deleted

## 2023-04-23 MED ORDER — LEVOFLOXACIN 500 MG PO TABS: 500.0000 mg | ORAL_TABLET | Freq: Every day | ORAL | 0 refills | Status: DC

## 2023-04-23 NOTE — Progress Notes (Signed)
Received call from pt with complaint of ongoing URI symptoms including post nasal drainage and hoarseness of voice.  RN reviewed with MD and verbal orders received for pt to receive Levaquin 500 mg p.o daily x10 days.  Prescription sent to pharmacy on file.  Pt educated and verbalized understanding.

## 2023-04-25 ENCOUNTER — Telehealth: Payer: Self-pay

## 2023-04-25 ENCOUNTER — Inpatient Hospital Stay: Payer: BC Managed Care – PPO

## 2023-04-25 VITALS — BP 129/81 | HR 80 | Temp 98.6°F | Resp 18

## 2023-04-25 DIAGNOSIS — Z95828 Presence of other vascular implants and grafts: Secondary | ICD-10-CM

## 2023-04-25 DIAGNOSIS — Z5112 Encounter for antineoplastic immunotherapy: Secondary | ICD-10-CM | POA: Diagnosis not present

## 2023-04-25 DIAGNOSIS — K769 Liver disease, unspecified: Secondary | ICD-10-CM

## 2023-04-25 DIAGNOSIS — C787 Secondary malignant neoplasm of liver and intrahepatic bile duct: Secondary | ICD-10-CM | POA: Diagnosis not present

## 2023-04-25 DIAGNOSIS — Z79899 Other long term (current) drug therapy: Secondary | ICD-10-CM | POA: Diagnosis not present

## 2023-04-25 DIAGNOSIS — D0511 Intraductal carcinoma in situ of right breast: Secondary | ICD-10-CM | POA: Diagnosis not present

## 2023-04-25 MED ORDER — SODIUM CHLORIDE 0.9% FLUSH
10.0000 mL | Freq: Once | INTRAVENOUS | Status: AC
  Administered 2023-04-25: 10 mL

## 2023-04-25 MED ORDER — ALTEPLASE 2 MG IJ SOLR: 2.0000 mg | Freq: Once | INTRAMUSCULAR | Status: DC

## 2023-04-25 MED ORDER — HEPARIN SOD (PORK) LOCK FLUSH 100 UNIT/ML IV SOLN
500.0000 [IU] | Freq: Once | INTRAVENOUS | Status: AC
  Administered 2023-04-25: 500 [IU]

## 2023-04-25 MED ORDER — HEPARIN SOD (PORK) LOCK FLUSH 100 UNIT/ML IV SOLN: 500.0000 [IU] | Freq: Once | INTRAVENOUS | Status: DC

## 2023-04-25 MED ORDER — SODIUM CHLORIDE 0.9 % IV SOLN: Freq: Once | INTRAVENOUS | Status: AC

## 2023-04-25 MED ORDER — SODIUM CHLORIDE 0.9% FLUSH: 10.0000 mL | Freq: Once | INTRAVENOUS | Status: DC

## 2023-04-25 NOTE — Patient Instructions (Signed)

## 2023-04-25 NOTE — Telephone Encounter (Signed)
Pt called regarding new right ear pain. Pt states she is on day 3 of Levaquin but thinks she is developing an ear infection and would like to know if antibiotic needs to be changed. Per MD, Levaquin will cover an ear infection and to give more time for antibiotic to work. Instructed Pt to call back if symptoms are the same or worsened by Monday. Advised Pt that we do not have available provider appts today and that if she feels that she needs to be evaluated she should seek care from Urgent Care. Pt verbalized understanding.

## 2023-05-09 ENCOUNTER — Other Ambulatory Visit: Payer: BC Managed Care – PPO

## 2023-05-09 ENCOUNTER — Inpatient Hospital Stay: Payer: BC Managed Care – PPO

## 2023-05-09 ENCOUNTER — Ambulatory Visit: Payer: BC Managed Care – PPO

## 2023-05-09 ENCOUNTER — Inpatient Hospital Stay: Payer: BC Managed Care – PPO | Attending: Hematology and Oncology

## 2023-05-09 ENCOUNTER — Other Ambulatory Visit: Payer: Self-pay

## 2023-05-09 ENCOUNTER — Ambulatory Visit: Payer: BC Managed Care – PPO | Admitting: Adult Health

## 2023-05-09 ENCOUNTER — Inpatient Hospital Stay: Payer: BC Managed Care – PPO | Admitting: Physician Assistant

## 2023-05-09 VITALS — BP 143/97 | HR 83 | Temp 97.6°F | Resp 18 | Ht 65.0 in | Wt 171.9 lb

## 2023-05-09 DIAGNOSIS — C50919 Malignant neoplasm of unspecified site of unspecified female breast: Secondary | ICD-10-CM

## 2023-05-09 DIAGNOSIS — Z923 Personal history of irradiation: Secondary | ICD-10-CM | POA: Diagnosis not present

## 2023-05-09 DIAGNOSIS — Z95828 Presence of other vascular implants and grafts: Secondary | ICD-10-CM

## 2023-05-09 DIAGNOSIS — Z5111 Encounter for antineoplastic chemotherapy: Secondary | ICD-10-CM | POA: Diagnosis not present

## 2023-05-09 DIAGNOSIS — Z79899 Other long term (current) drug therapy: Secondary | ICD-10-CM | POA: Diagnosis not present

## 2023-05-09 DIAGNOSIS — Z87891 Personal history of nicotine dependence: Secondary | ICD-10-CM | POA: Insufficient documentation

## 2023-05-09 DIAGNOSIS — D0511 Intraductal carcinoma in situ of right breast: Secondary | ICD-10-CM | POA: Insufficient documentation

## 2023-05-09 DIAGNOSIS — Z5112 Encounter for antineoplastic immunotherapy: Secondary | ICD-10-CM | POA: Insufficient documentation

## 2023-05-09 DIAGNOSIS — Z856 Personal history of leukemia: Secondary | ICD-10-CM | POA: Insufficient documentation

## 2023-05-09 DIAGNOSIS — Z170 Estrogen receptor positive status [ER+]: Secondary | ICD-10-CM | POA: Insufficient documentation

## 2023-05-09 DIAGNOSIS — K769 Liver disease, unspecified: Secondary | ICD-10-CM

## 2023-05-09 DIAGNOSIS — C787 Secondary malignant neoplasm of liver and intrahepatic bile duct: Secondary | ICD-10-CM | POA: Diagnosis not present

## 2023-05-09 LAB — CBC WITH DIFFERENTIAL (CANCER CENTER ONLY)
Abs Immature Granulocytes: 0.14 10*3/uL — ABNORMAL HIGH (ref 0.00–0.07)
Basophils Absolute: 0.1 10*3/uL (ref 0.0–0.1)
Basophils Relative: 1 %
Eosinophils Absolute: 0 10*3/uL (ref 0.0–0.5)
Eosinophils Relative: 0 %
HCT: 35.2 % — ABNORMAL LOW (ref 36.0–46.0)
Hemoglobin: 11.5 g/dL — ABNORMAL LOW (ref 12.0–15.0)
Immature Granulocytes: 1 %
Lymphocytes Relative: 24 %
Lymphs Abs: 2.7 10*3/uL (ref 0.7–4.0)
MCH: 28 pg (ref 26.0–34.0)
MCHC: 32.7 g/dL (ref 30.0–36.0)
MCV: 85.6 fL (ref 80.0–100.0)
Monocytes Absolute: 1 10*3/uL (ref 0.1–1.0)
Monocytes Relative: 8 %
Neutro Abs: 7.6 10*3/uL (ref 1.7–7.7)
Neutrophils Relative %: 66 %
Platelet Count: 251 10*3/uL (ref 150–400)
RBC: 4.11 MIL/uL (ref 3.87–5.11)
RDW: 15.4 % (ref 11.5–15.5)
WBC Count: 11.5 10*3/uL — ABNORMAL HIGH (ref 4.0–10.5)
nRBC: 0 % (ref 0.0–0.2)

## 2023-05-09 LAB — CMP (CANCER CENTER ONLY)
ALT: 23 U/L (ref 0–44)
AST: 16 U/L (ref 15–41)
Albumin: 3.8 g/dL (ref 3.5–5.0)
Alkaline Phosphatase: 62 U/L (ref 38–126)
Anion gap: 7 (ref 5–15)
BUN: 24 mg/dL — ABNORMAL HIGH (ref 6–20)
CO2: 26 mmol/L (ref 22–32)
Calcium: 9.2 mg/dL (ref 8.9–10.3)
Chloride: 108 mmol/L (ref 98–111)
Creatinine: 0.71 mg/dL (ref 0.44–1.00)
GFR, Estimated: >60 mL/min (ref >60)
Glucose, Bld: 135 mg/dL — ABNORMAL HIGH (ref 70–99)
Potassium: 3.4 mmol/L — ABNORMAL LOW (ref 3.5–5.1)
Sodium: 141 mmol/L (ref 135–145)
Total Bilirubin: 0.4 mg/dL (ref 0.3–1.2)
Total Protein: 6.3 g/dL — ABNORMAL LOW (ref 6.5–8.1)

## 2023-05-09 LAB — PREGNANCY, URINE: Preg Test, Ur: NEGATIVE

## 2023-05-09 MED ORDER — SODIUM CHLORIDE 0.9% FLUSH
10.0000 mL | INTRAVENOUS | Status: DC | PRN
  Administered 2023-05-09: 10 mL

## 2023-05-09 MED ORDER — TRASTUZUMAB-ANNS CHEMO 420 MG IV SOLR
6.0000 mg/kg | Freq: Once | INTRAVENOUS | Status: AC
  Administered 2023-05-09: 420 mg via INTRAVENOUS
  Filled 2023-05-09: qty 20

## 2023-05-09 MED ORDER — ACETAMINOPHEN 325 MG PO TABS
650.0000 mg | ORAL_TABLET | Freq: Once | ORAL | Status: AC
  Administered 2023-05-09: 650 mg via ORAL
  Filled 2023-05-09: qty 2

## 2023-05-09 MED ORDER — DEXAMETHASONE SODIUM PHOSPHATE 10 MG/ML IJ SOLN
4.0000 mg | Freq: Once | INTRAMUSCULAR | Status: AC
  Administered 2023-05-09: 4 mg via INTRAVENOUS
  Filled 2023-05-09: qty 1

## 2023-05-09 MED ORDER — SODIUM CHLORIDE 0.9 % IV SOLN
420.0000 mg | Freq: Once | INTRAVENOUS | Status: AC
  Administered 2023-05-09: 420 mg via INTRAVENOUS
  Filled 2023-05-09: qty 14

## 2023-05-09 MED ORDER — DIPHENHYDRAMINE HCL 25 MG PO CAPS
50.0000 mg | ORAL_CAPSULE | Freq: Once | ORAL | Status: AC
  Administered 2023-05-09: 50 mg via ORAL
  Filled 2023-05-09: qty 2

## 2023-05-09 MED ORDER — HEPARIN SOD (PORK) LOCK FLUSH 100 UNIT/ML IV SOLN
500.0000 [IU] | Freq: Once | INTRAVENOUS | Status: AC | PRN
  Administered 2023-05-09: 500 [IU]

## 2023-05-09 MED ORDER — SODIUM CHLORIDE 0.9 % IV SOLN: Freq: Once | INTRAVENOUS | Status: AC

## 2023-05-09 MED ORDER — SODIUM CHLORIDE 0.9% FLUSH
10.0000 mL | Freq: Once | INTRAVENOUS | Status: AC
  Administered 2023-05-09: 10 mL

## 2023-05-09 MED ORDER — SODIUM CHLORIDE 0.9 % IV SOLN
65.0000 mg/m2 | Freq: Once | INTRAVENOUS | Status: AC
  Administered 2023-05-09: 121 mg via INTRAVENOUS
  Filled 2023-05-09: qty 12.1

## 2023-05-09 NOTE — Patient Instructions (Signed)
[facility]  Discharge Instructions: Thank you for choosing [facility] to provide your oncology and hematology care.   If you have a lab appointment with the Cancer Center, please go directly to the Cancer Center and check in at the registration area.   Wear comfortable clothing and clothing appropriate for easy access to any Portacath or PICC line.   We strive to give you quality time with your provider. You may need to reschedule your appointment if you arrive late (15 or more minutes).  Arriving late affects you and other patients whose appointments are after yours.  Also, if you miss three or more appointments without notifying the office, you may be dismissed from the clinic at the provider's discretion.      For prescription refill requests, have your pharmacy contact our office and allow 72 hours for refills to be completed.    Today you received the following chemotherapy and/or immunotherapy agents: trastuzumab, pertuzumab, and docetaxel      To help prevent nausea and vomiting after your treatment, we encourage you to take your nausea medication as directed.  BELOW ARE SYMPTOMS THAT SHOULD BE REPORTED IMMEDIATELY: *FEVER GREATER THAN 100.4 F (38 C) OR HIGHER *CHILLS OR SWEATING *NAUSEA AND VOMITING THAT IS NOT CONTROLLED WITH YOUR NAUSEA MEDICATION *UNUSUAL SHORTNESS OF BREATH *UNUSUAL BRUISING OR BLEEDING *URINARY PROBLEMS (pain or burning when urinating, or frequent urination) *BOWEL PROBLEMS (unusual diarrhea, constipation, pain near the anus) TENDERNESS IN MOUTH AND THROAT WITH OR WITHOUT PRESENCE OF ULCERS (sore throat, sores in mouth, or a toothache) UNUSUAL RASH, SWELLING OR PAIN  UNUSUAL VAGINAL DISCHARGE OR ITCHING   Items with * indicate a potential emergency and should be followed up as soon as possible or go to the Emergency Department if any problems should occur.  Please show the CHEMOTHERAPY ALERT CARD or  IMMUNOTHERAPY ALERT CARD at check-in to the Emergency Department and triage nurse.  Should you have questions after your visit or need to cancel or reschedule your appointment, please contact [facility]  Dept: [phone]  and follow the prompts.  Office hours are 8:00 a.m. to 4:30 p.m. Monday - Friday. Please note that voicemails left after 4:00 p.m. may not be returned until the following business day.  We are closed weekends and major holidays. You have access to a nurse at all times for urgent questions. Please call the main number to the clinic Dept: [phone] and follow the prompts.   For any non-urgent questions, you may also contact your provider using MyChart. We now offer e-Visits for anyone 18 and older to request care online for non-urgent symptoms. For details visit [email or web address].   Also download the MyChart app! Go to the app store, search "MyChart", open the app, select [facility], and log in with your MyChart username and password.   

## 2023-05-09 NOTE — Progress Notes (Signed)
[facility] [phone]   [phone]) [phone]  PROGRESS NOTE  Patient Care Team: [name], MD as PCP - General (Family Medicine) Donnelly Angelica, RN as Oncology Nurse Navigator Pershing Proud, RN as Oncology Nurse Navigator Serena Croissant, MD as Consulting Physician (Hematology and Oncology) Default, Provider, MD as Technician   CHIEF COMPLAINTS/PURPOSE OF CONSULTATION:  Carcinoma of breast metastatic to liver, unspecified laterality [facility])  Oncology History  Breast cancer metastasized to liver (HCC)  01/25/2019 Initial Diagnosis   Screening mammogram showed bilateral calcifications right breast 5 mm, pleomorphic.  Left breast 1.6 cm punctate, smaller group of calcifications spanning 3 mm.  Right lower outer quadrant biopsy revealed low-grade DCIS ER 90%, PR 70%; biopsy of left breast calcifications ALH and PASH   01/31/2019 Genetic Testing   ATM c.8495G>A and RECQL4 c.2587G>A VUS identified on the 9-gene STAT panel and Multi-cancer panel through [facility].  The STAT Breast cancer panel offered by [facility] includes sequencing and rearrangement analysis for the following 9 genes:  ATM, BRCA1, BRCA2, CDH1, CHEK2, PALB2, PTEN, STK11 and TP53.   The Multi-Gene Panel offered by [facility] includes sequencing and/or deletion duplication testing of the following 84 genes: AIP, ALK, APC, ATM, AXIN2,BAP1,  BARD1, BLM, BMPR1A, BRCA1, BRCA2, BRIP1, CASR, CDC73, CDH1, CDK4, CDKN1B, CDKN1C, CDKN2A (p14ARF), CDKN2A (p16INK4a), CEBPA, CHEK2, CTNNA1, DICER1, DIS3L2, EGFR (c.2369C>T, p.Thr790Met variant only), EPCAM (Deletion/duplication testing only), FH, FLCN, GATA2, GPC3, GREM1 (Promoter region deletion/duplication testing only), HOXB13 (c.251G>A, p.Gly84Glu), HRAS, KIT, MAX, MEN1, MET, MITF (c.952G>A, p.Glu318Lys variant only), MLH1, MSH2, MSH3, MSH6, MUTYH, NBN, NF1, NF2, NTHL1, PALB2, PDGFRA, PHOX2B, PMS2, POLD1, POLE, POT1, PRKAR1A, PTCH1, PTEN, RAD50, RAD51C, RAD51D, RB1, RECQL4,  RET, RUNX1, SDHAF2, SDHA (sequence changes only), SDHB, SDHC, SDHD, SMAD4, SMARCA4, SMARCB1, SMARCE1, STK11, SUFU, TERC, TERT, TMEM127, TP53, TSC1, TSC2, VHL, WRN and WT1.  he report date is February 02, 2019.   02/11/2019 Surgery   RT lumpectomy: DICS Intermediate grade, Margins Neg, ER 90%, PR 70%; Lt Lumpectomy: UDH   02/26/2019 -  Anti-estrogen oral therapy   Tamoxifen 20mg  daily, stopped because of depression and facial numbness   05/20/2019 - 06/17/2019 Radiation Therapy   Adjuvant XRT   08/15/2021 Relapse/Recurrence   MRI surveillance detected Right breast calcifications 5 cm, Ant biopsy: IG DCIS with necrosis, ER 40%, PR 0%, Posterior biopsy: Intermediate grade DCIS with [facility]   08/19/2022 Cancer Staging   Staging form: Breast, AJCC 8th Edition - Clinical: Stage IV (cT84mi, cNX, cM1, G3, ER: Unknown, PR: Unknown, HER2: Unknown) - Signed by Serena Croissant, MD on 02/06/2023 Histologic grading system: 3 grade system   02/14/2023 -  Chemotherapy   Patient is on Treatment Plan : BREAST DOCEtaxel + Trastuzumab + Pertuzumab (THP) q21d x 8 cycles / Trastuzumab + Pertuzumab q21d x 4 cycles     AML (acute myeloid leukemia) (HCC)  09/1998 Initial Diagnosis   AML (acute myeloid leukemia) (HCC) treated with 2 induction regimens followed by 6 consolidation regimens, remission    CURRENT TREATMENT:  Taxotere/Herceptin/Perjeta  INTERVAL HISTORY:  Joann Meadows 47 y.o. female presents for a follow up before Cycle 5, Day 1 of Taxotere/Herceptin/Perjeta.   On exam today, Ms. Dillion reports  MEDICAL HISTORY:  Past Medical History:  Diagnosis Date   Breast cancer (HCC) 2020   Right Breast Cancer   Endometriosis    Fibroid    Hx of radiation therapy 06/2019   Leukemia (HCC)    20 years ago   Personal history of radiation therapy 2020  Right Breast Cancer   PONV (postoperative nausea and vomiting)    pt states she vomits after colonoscopies    SURGICAL HISTORY: Past Surgical History:   Procedure Laterality Date   BREAST EXCISIONAL BIOPSY Left 02/11/2019   [facility]   BREAST LUMPECTOMY Right 02/11/2019   BREAST LUMPECTOMY WITH RADIOACTIVE SEED LOCALIZATION Bilateral 02/11/2019   Procedure: BILATERAL BREAST LUMPECTOMIES WITH BILATERAL RADIOACTIVE SEED LOCALIZATION;  Surgeon: Manus Rudd, MD;  Location: [facility];  Service: General;  Laterality: Bilateral;   IR IMAGING GUIDED PORT INSERTION  02/25/2023   IR RADIOLOGIST EVAL & MGMT  02/27/2023   MASTECTOMY Right 11/2021    SOCIAL HISTORY: Social History   Socioeconomic History   Marital status: Single    Spouse name: Not on file   Number of children: 2   Years of education: Not on file   Highest education level: High school graduate  Occupational History   Not on file  Tobacco Use   Smoking status: Former    Years: 10    Types: Cigarettes    Quit date: 01/25/2015    Years since quitting: 8.2   Smokeless tobacco: Never  Vaping Use   Vaping Use: Never used  Substance and Sexual Activity   Alcohol use: No   Drug use: No   Sexual activity: Yes    Birth control/protection: None  Other Topics Concern   Not on file  Social History Narrative   Lives with children   Social Determinants of Health   Financial Resource Strain: Not on file  Food Insecurity: No Food Insecurity (10/03/2022)   Hunger Vital Sign    Worried About Running Out of Food in the Last Year: Never true    Ran Out of Food in the Last Year: Never true  Transportation Needs: No Transportation Needs (10/03/2022)   PRAPARE - Administrator, Civil Service (Medical): No    Lack of Transportation (Non-Medical): No  Physical Activity: Not on file  Stress: Not on file  Social Connections: Not on file  Intimate Partner Violence: Not At Risk (02/26/2019)   Humiliation, Afraid, Rape, and Kick questionnaire    Fear of Current or Ex-Partner: No    Emotionally Abused: No    Physically Abused: No    Sexually Abused: No    FAMILY  HISTORY: Family History  Problem Relation Age of Onset   Colon cancer Mother 66       d. 1   Hypertension Father    Diabetes Maternal Aunt    Diabetes Maternal Uncle    Mental retardation Maternal Uncle    Diabetes Paternal Aunt    Diabetes Maternal Grandmother    Stroke Paternal Grandmother        c. 31   Leukemia Paternal Grandfather        d. 49; ?CLL   Leukemia Cousin 4       mat first cousin   Lymphoma Cousin 40       pat first cousin    ALLERGIES:  is allergic to levaquin [levofloxacin], tamoxifen citrate, and tape.  MEDICATIONS:  Current Outpatient Medications  Medication Sig Dispense Refill   ALPRAZolam (XANAX) 0.25 MG tablet 1 tablet Orally once a day if needed for anxiety/panic (not a daily medication, avoid regular use) for 30 days     cholecalciferol (VITAMIN D3) 25 MCG (1000 UNIT) tablet Take 2,000 Units by mouth daily.     DEXAMETHASONE PO Take by mouth. Taking day prior to treatment and  day after treatment     hydrochlorothiazide (HYDRODIURIL) 25 MG tablet TAKE 1 TABLET BY MOUTH EVERY DAY IN THE MORNING FOR 30 DAYS ONCE A DAY     magic mouthwash w/lidocaine SOLN Take 5 mLs by mouth 4 (four) times daily as needed for mouth pain. 240 mL 2   olmesartan (BENICAR) 5 MG tablet Take by mouth.     ondansetron (ZOFRAN) 8 MG tablet Take 1 tablet (8 mg total) by mouth every 8 (eight) hours as needed for nausea or vomiting. 30 tablet 1   oxyCODONE-acetaminophen (PERCOCET/ROXICET) 5-325 MG tablet Take 1 tablet by mouth every 6 (six) hours as needed for severe pain. 15 tablet 0   potassium chloride SA (KLOR-CON M) 20 MEQ tablet Take 1 tablet (20 mEq total) by mouth daily. 30 tablet 3   azithromycin (ZITHROMAX Z-PAK) 250 MG tablet Use as directed (Patient not taking: Reported on 05/09/2023) 6 each 0   cephALEXin (KEFLEX) 500 MG capsule Take 1 capsule (500 mg total) by mouth 2 (two) times daily. (Patient not taking: Reported on 05/09/2023) 10 capsule 0   levofloxacin (LEVAQUIN) 500  MG tablet Take 1 tablet (500 mg total) by mouth daily. (Patient not taking: Reported on 05/09/2023) 10 tablet 0   No current facility-administered medications for this visit.    REVIEW OF SYSTEMS:   Constitutional: ( - ) fevers, ( - )  chills , ( - ) night sweats Eyes: ( - ) blurriness of vision, ( - ) double vision, ( - ) watery eyes Ears, nose, mouth, throat, and face: ( - ) mucositis, ( - ) sore throat Respiratory: ( - ) cough, ( - ) dyspnea, ( - ) wheezes Cardiovascular: ( - ) palpitation, ( - ) chest discomfort, ( - ) lower extremity swelling Gastrointestinal:  ( - ) nausea, ( - ) heartburn, ( - ) change in bowel habits Skin: ( - ) abnormal skin rashes Lymphatics: ( - ) new lymphadenopathy, ( - ) easy bruising Neurological: ( - ) numbness, ( - ) tingling, ( - ) new weaknesses Behavioral/Psych: ( - ) mood change, ( - ) new changes  All other systems were reviewed with the patient and are negative.  PHYSICAL EXAMINATION: ECOG PERFORMANCE STATUS: {CHL ONC ECOG ZH:[id number]}  Vitals:   05/09/23 1050  BP: (!) 143/97  Pulse: 83  Resp: 18  Temp: 97.6 F (36.4 C)  SpO2: 100%   Filed Weights   05/09/23 1050  Weight: 171 lb 14.4 oz (78 kg)    GENERAL: well appearing *** in NAD  SKIN: skin color, texture, turgor are normal, no rashes or significant lesions EYES: conjunctiva are pink and non-injected, sclera clear OROPHARYNX: no exudate, no erythema; lips, buccal mucosa, and tongue normal  NECK: supple, non-tender LYMPH:  no palpable lymphadenopathy in the cervical, axillary or supraclavicular lymph nodes.  LUNGS: clear to auscultation and percussion with normal breathing effort HEART: regular rate & rhythm and no murmurs and no lower extremity edema ABDOMEN: soft, non-tender, non-distended, normal bowel sounds Musculoskeletal: no cyanosis of digits and no clubbing  PSYCH: alert & oriented x 3, fluent speech NEURO: no focal motor/sensory deficits  LABORATORY DATA:  I have  reviewed the data as listed    Latest Ref Rng & Units 05/09/2023   10:19 AM 04/17/2023    9:04 AM 03/28/2023   10:30 AM  CBC  WBC 4.0 - 10.5 K/uL 11.5  12.8  9.8   Hemoglobin 12.0 - 15.0 g/dL 29.5  12.4  11.8   Hematocrit 36.0 - 46.0 % 35.2  37.9  36.4   Platelets 150 - 400 K/uL 251  222  257        Latest Ref Rng & Units 04/17/2023    9:04 AM 03/28/2023   10:30 AM 03/10/2023   10:44 AM  CMP  Glucose 70 - 99 mg/dL 161  096  94   BUN 6 - 20 mg/dL 11  19  16    Creatinine 0.44 - 1.00 mg/dL 0.45  4.09  8.11   Sodium 135 - 145 mmol/L 141  141  138   Potassium 3.5 - 5.1 mmol/L 4.4  3.5  3.7   Chloride 98 - 111 mmol/L 107  109  106   CO2 22 - 32 mmol/L 28  26  27    Calcium 8.9 - 10.3 mg/dL 9.2  9.0  8.8   Total Protein 6.5 - 8.1 g/dL 7.0  6.9  6.7   Total Bilirubin 0.3 - 1.2 mg/dL 0.4  0.3  0.3   Alkaline Phos 38 - 126 U/L 56  52  50   AST 15 - 41 U/L 11  12  11    ALT 0 - 44 U/L 11  13  13       PATHOLOGY: ***  BLOOD FILM: *** Review of the peripheral blood smear showed normal appearing white cells with neutrophils that were appropriately lobated and granulated. There was no predominance of bi-lobed or hyper-segmented neutrophils appreciated. No Dohle bodies were noted. There was no left shifting, immature forms or blasts noted. Lymphocytes remain normal in size without any predominance of large granular lymphocytes. Red cells show no anisopoikilocytosis, macrocytes , microcytes or polychromasia. There were no schistocytes, target cells, echinocytes, acanthocytes, dacrocytes, or stomatocytes.There was no rouleaux formation, nucleated red cells, or intra-cellular inclusions noted. The platelets are normal in size, shape, and color without any clumping evident.  RADIOGRAPHIC STUDIES: I have personally reviewed the radiological images as listed and agreed with the findings in the report. CT CHEST ABDOMEN PELVIS W CONTRAST  Result Date: 04/17/2023 CLINICAL DATA:  47 year old female with  history of metastatic breast cancer. Follow-up study to evaluate for treatment response. * Tracking Code: BO * EXAM: CT CHEST, ABDOMEN, AND PELVIS WITH CONTRAST TECHNIQUE: Multidetector CT imaging of the chest, abdomen and pelvis was performed following the standard protocol during bolus administration of intravenous contrast. RADIATION DOSE REDUCTION: This exam was performed according to the departmental dose-optimization program which includes automated exposure control, adjustment of the mA and/or kV according to patient size and/or use of iterative reconstruction technique. CONTRAST:  OMNIPAQUE IOHEXOL 300 MG/ML  SOLN COMPARISON:  Multiple priors, most recently PET-CT 01/21/2023, abdominal MRI 01/07/2023, and CT of the chest, abdomen and pelvis 12/09/2022. FINDINGS: CT CHEST FINDINGS Cardiovascular: Heart size is normal. There is no significant pericardial fluid, thickening or pericardial calcification. No significant atherosclerotic disease noted in the thoracic aorta. No definite coronary artery calcifications. Left-sided internal jugular single-lumen porta cath with tip terminating in the right atrium. Mediastinum/Nodes: No pathologically enlarged mediastinal, internal mammary or hilar lymph nodes. Esophagus is unremarkable in appearance. No axillary lymphadenopathy. Numerous surgical clips in the right axillary region from prior lymph node dissection. Lungs/Pleura: Several tiny pulmonary nodules are again noted throughout the lungs bilaterally, unchanged in size, number and distribution compared to prior examinations dating back to 10/27/2019, considered benign, with the largest of these nodules in the right upper lobe (axial image 51 of series 7) measuring 4 mm. No other  new suspicious appearing pulmonary nodules or masses are noted. Mild chronic post infectious or inflammatory scarring in the medial aspect of the right upper and middle lobes, as well as the inferior segment of the lingula. No acute  consolidative airspace disease. No pleural effusions. Musculoskeletal: Status post right modified radical mastectomy and right axillary lymph node dissection with right-sided breast implant in place. There are no aggressive appearing lytic or blastic lesions noted in the visualized portions of the skeleton. CT ABDOMEN PELVIS FINDINGS Hepatobiliary: Multiple liver lesions are again noted. When comparing with prior abdominal MRI and prior PET-CT, the previously noted metastatic lesions which demonstrated hypermetabolism on PET imaging in segment 8 (axial image 43 of series 3) currently measure 1.6 x 1.6 cm (previously 2.3 x 2.2 cm by prior MRI) and in segment 5 (axial image 62 of series 3) currently measure 1.3 x 1.4 cm (previously 1.8 x 1.8 cm on prior MRI). The majority of the other previously noted low-attenuation lesions in the liver appear similar to prior examinations, previously characterized as cavernous hemangiomas on prior abdominal MRI examinations, largest of which is in segment 2 measuring 2.7 x 2.1 cm (axial image 46 of series 3). The only potential new lesion noted is a subtle 6 mm hypovascular lesion in the periphery of segment 8 (axial image 44 of series 3). This lesion is too small to definitively characterize. No intra or extrahepatic biliary ductal dilatation. Gallbladder is unremarkable in appearance. Pancreas: No pancreatic mass. No pancreatic ductal dilatation. No pancreatic or peripancreatic fluid collections or inflammatory changes. Spleen: Unremarkable. Adrenals/Urinary Tract: Multiple low-attenuation lesions in both kidneys compatible with simple cysts (Bosniak class 1, no imaging follow-up recommended), measuring up to 2 cm in the medial aspect of the interpolar region of the right kidney. No suspicious renal lesions. No hydroureteronephrosis. Urinary bladder is normal in appearance. Bilateral adrenal glands are normal in appearance. Stomach/Bowel: The appearance of the stomach is normal.  No pathologic dilatation of small bowel or colon. Normal appendix. Vascular/Lymphatic: Aortic atherosclerosis, without evidence of aneurysm or dissection in the abdominal or pelvic vasculature. No lymphadenopathy noted in the abdomen or pelvis. Reproductive: Uterus is mildly enlarged and heterogeneous in appearance with innumerable small heterogeneously enhancing lesions, presumably multifocal fibroids, similar to the prior examination. Ovaries are unremarkable in appearance. Other: No significant volume of ascites.  No pneumoperitoneum. Musculoskeletal: There are no aggressive appearing lytic or blastic lesions noted in the visualized portions of the skeleton. IMPRESSION: 1. Today's study demonstrates a positive response to therapy with slight regression of previously noted metastatic lesions in segments 5 and 8 of the liver. There is a very subtle hypovascular area in the periphery of segment 8 measuring only 6 mm on today's examination. The possibility of a new metastatic lesion should be considered, although this is far from definitive. This could be further evaluated with repeat abdominal MRI with and without IV gadolinium if clinically appropriate, or followed on subsequent CT examinations. No other definite evidence of new metastatic disease noted elsewhere in the chest, abdomen or pelvis. 2. Previously noted small pulmonary nodules measuring 4 mm or less are stable dating back to 2020, considered benign. 3. Fibroid uterus. 4. Aortic atherosclerosis. 5. Additional incidental findings, as above. Electronically Signed   By: Trudie Reed M.D.   On: 04/17/2023 06:26    ASSESSMENT & PLAN ***  No orders of the defined types were placed in this encounter.   All questions were answered. The patient knows to call the clinic with any  problems, questions or concerns.  I have spent a total of {CHL ONC TIME VISIT - ZOXWR:[id number]} minutes of face-to-face and non-face-to-face time, preparing to see the  patient, obtaining and/or reviewing separately obtained history, performing a medically appropriate examination, counseling and educating the patient, ordering medications/tests/procedures, referring and communicating with other health care professionals, documenting clinical information in the electronic health record, independently interpreting results and communicating results to the patient, and care coordination.   Georga Kaufmann, PA-C Department of Hematology/Oncology [facility] Phone: [phone]

## 2023-05-09 NOTE — Progress Notes (Signed)
Per Dr. Bertis Ruddy, ok to keep dose at 420mg  today.   Renaee Munda, PharmD PGY-2 Pharmacy Resident Hematology/Oncology [phone]  05/09/2023 11:52 AM

## 2023-05-09 NOTE — Progress Notes (Signed)
Spoke with Lauren in the lab and she confirmed that the patient urine pregnancy test is negative. They are just having technical problems posting the results in EPIC. Pharmacy made aware.

## 2023-05-10 ENCOUNTER — Encounter: Payer: Self-pay | Admitting: Hematology and Oncology

## 2023-05-12 ENCOUNTER — Other Ambulatory Visit: Payer: Self-pay | Admitting: Hematology and Oncology

## 2023-05-15 ENCOUNTER — Other Ambulatory Visit: Payer: Self-pay | Admitting: *Deleted

## 2023-05-15 MED ORDER — AZITHROMYCIN 250 MG PO TABS: ORAL_TABLET | ORAL | 0 refills | Status: DC

## 2023-05-15 NOTE — Progress Notes (Signed)
Received call from pt with complaint of URI symptoms including post nasal drainage, congestion and sore throat x2 days.  Pt denies fever or recent sick contact at this time.  Verbal orders received from MD for pt to be prescribed Z-Pak.  Prescription sent to pharmacy on file, pt educated and verbalized understanding.

## 2023-05-19 ENCOUNTER — Telehealth: Payer: Self-pay | Admitting: *Deleted

## 2023-05-19 NOTE — Progress Notes (Unsigned)
Symptom Management Consult Note [facility]    Patient Care Team: [name], MD as PCP - General (Family Medicine) Donnelly Angelica, RN as Oncology Nurse Navigator Pershing Proud, RN as Oncology Nurse Navigator Serena Croissant, MD as Consulting Physician (Hematology and Oncology) Default, Provider, MD as Technician    Name / MRN / DOB: Joann Meadows  [id number]  04-14-1976   Date of visit: 05/20/2023   Chief Complaint/Reason for visit: sore throat   Current Therapy: Taxotere, herceptin, perjeta  Last treatment:  Day 1   Cycle 5 on 05/09/23   ASSESSMENT & PLAN: Patient is a 47 y.o. female  with oncologic history of carcinoma of breast metastatic to liver followed by Dr. Pamelia Hoit.  I have viewed most recent oncology note and lab work.    #Carcinoma of breast metastatic to liver  - Next appointment with oncologist is 05/28/23   #Sore throat -Afebrile, well-appearing.  No signs of oral mucositis. -Patient is already completed 2 rounds of antibiotics and HPI and exam are not suggestive of infection. Will try treating symptoms with Magic mouthwash with lidocaine that she can swish and swallow.  If that does not help I have also sent a prescription for Carafate for her to dissolve and drink.  Discussed trying OTC Pepcid if other inventions do not help. -Oncologist agrees with plan.      Heme/Onc History: Oncology History  Breast cancer metastasized to liver (HCC)  01/25/2019 Initial Diagnosis   Screening mammogram showed bilateral calcifications right breast 5 mm, pleomorphic.  Left breast 1.6 cm punctate, smaller group of calcifications spanning 3 mm.  Right lower outer quadrant biopsy revealed low-grade DCIS ER 90%, PR 70%; biopsy of left breast calcifications ALH and PASH   01/31/2019 Genetic Testing   ATM c.8495G>A and RECQL4 c.2587G>A VUS identified on the 9-gene STAT panel and Multi-cancer panel through [facility].  The STAT Breast cancer panel offered by  [facility] includes sequencing and rearrangement analysis for the following 9 genes:  ATM, BRCA1, BRCA2, CDH1, CHEK2, PALB2, PTEN, STK11 and TP53.   The Multi-Gene Panel offered by [facility] includes sequencing and/or deletion duplication testing of the following 84 genes: AIP, ALK, APC, ATM, AXIN2,BAP1,  BARD1, BLM, BMPR1A, BRCA1, BRCA2, BRIP1, CASR, CDC73, CDH1, CDK4, CDKN1B, CDKN1C, CDKN2A (p14ARF), CDKN2A (p16INK4a), CEBPA, CHEK2, CTNNA1, DICER1, DIS3L2, EGFR (c.2369C>T, p.Thr790Met variant only), EPCAM (Deletion/duplication testing only), FH, FLCN, GATA2, GPC3, GREM1 (Promoter region deletion/duplication testing only), HOXB13 (c.251G>A, p.Gly84Glu), HRAS, KIT, MAX, MEN1, MET, MITF (c.952G>A, p.Glu318Lys variant only), MLH1, MSH2, MSH3, MSH6, MUTYH, NBN, NF1, NF2, NTHL1, PALB2, PDGFRA, PHOX2B, PMS2, POLD1, POLE, POT1, PRKAR1A, PTCH1, PTEN, RAD50, RAD51C, RAD51D, RB1, RECQL4, RET, RUNX1, SDHAF2, SDHA (sequence changes only), SDHB, SDHC, SDHD, SMAD4, SMARCA4, SMARCB1, SMARCE1, STK11, SUFU, TERC, TERT, TMEM127, TP53, TSC1, TSC2, VHL, WRN and WT1.  he report date is February 02, 2019.   02/11/2019 Surgery   RT lumpectomy: DICS Intermediate grade, Margins Neg, ER 90%, PR 70%; Lt Lumpectomy: UDH   02/26/2019 -  Anti-estrogen oral therapy   Tamoxifen 20mg  daily, stopped because of depression and facial numbness   05/20/2019 - 06/17/2019 Radiation Therapy   Adjuvant XRT   08/15/2021 Relapse/Recurrence   MRI surveillance detected Right breast calcifications 5 cm, Ant biopsy: IG DCIS with necrosis, ER 40%, PR 0%, Posterior biopsy: Intermediate grade DCIS with [facility]   08/19/2022 Cancer Staging   Staging form: Breast, AJCC 8th Edition - Clinical: Stage IV (cT30mi, cNX, cM1, G3, ER: Unknown, PR: Unknown,  HER2: Unknown) - Signed by Serena Croissant, MD on 02/06/2023 Histologic grading system: 3 grade system   02/14/2023 -  Chemotherapy   Patient is on Treatment Plan : BREAST DOCEtaxel + Trastuzumab + Pertuzumab (THP) q21d x 8  cycles / Trastuzumab + Pertuzumab q21d x 4 cycles     AML (acute myeloid leukemia) (HCC)  09/1998 Initial Diagnosis   AML (acute myeloid leukemia) (HCC) treated with 2 induction regimens followed by 6 consolidation regimens, remission       Interval history-: Joann Meadows is a 47 y.o. female with oncologic history as above presenting to [facility] today with chief complaint of sore throat.  Patient presents unaccompanied to clinic.  Patient states she has been having soreness in her throat after her last 3 chemo treatments.  The soreness starts 3 days after the treatment.  She describes the pain as sharp.  Pain is worse when she tries to eat solid food.  She has been drinking hot tea which gives her minimal relief.  She has been taking ibuprofen without much symptom improvement either.  Patient has not had any fevers or other symptoms of a cold, no congestion or cough. Patient has completed 2 rounds of antibiotics recently. Patient with URI symptoms again including post nasal drainage and hoarseness reported on 04/23/23 and was treated with PO Levaquin x 10 days. She reported URI symptoms to oncologist including sore throat, congestion, and post nasal drainage on 05/15/23 and was prescribed a Z-Pak.She does feel that her symptoms have drastically improved after the antibiotics.     ROS  All other systems are reviewed and are negative for acute change except as noted in the HPI.    Allergies  Allergen Reactions   Levaquin [Levofloxacin] Swelling    Swelling feet and hands   Tamoxifen Citrate     Other reaction(s): depression, joint pain   Tape Rash     Past Medical History:  Diagnosis Date   Breast cancer (HCC) 2020   Right Breast Cancer   Endometriosis    Fibroid    Hx of radiation therapy 06/2019   Leukemia (HCC)    20 years ago   Personal history of radiation therapy 2020   Right Breast Cancer   PONV (postoperative nausea and vomiting)    pt states she vomits after  colonoscopies     Past Surgical History:  Procedure Laterality Date   BREAST EXCISIONAL BIOPSY Left 02/11/2019   [facility]   BREAST LUMPECTOMY Right 02/11/2019   BREAST LUMPECTOMY WITH RADIOACTIVE SEED LOCALIZATION Bilateral 02/11/2019   Procedure: BILATERAL BREAST LUMPECTOMIES WITH BILATERAL RADIOACTIVE SEED LOCALIZATION;  Surgeon: Manus Rudd, MD;  Location: [facility];  Service: General;  Laterality: Bilateral;   IR IMAGING GUIDED PORT INSERTION  02/25/2023   IR RADIOLOGIST EVAL & MGMT  02/27/2023   MASTECTOMY Right 11/2021    Social History   Socioeconomic History   Marital status: Single    Spouse name: Not on file   Number of children: 2   Years of education: Not on file   Highest education level: High school graduate  Occupational History   Not on file  Tobacco Use   Smoking status: Former    Years: 10    Types: Cigarettes    Quit date: 01/25/2015    Years since quitting: 8.3   Smokeless tobacco: Never  Vaping Use   Vaping Use: Never used  Substance and Sexual Activity   Alcohol use: No   Drug use: No  Sexual activity: Yes    Birth control/protection: None  Other Topics Concern   Not on file  Social History Narrative   Lives with children   Social Determinants of Health   Financial Resource Strain: Not on file  Food Insecurity: No Food Insecurity (10/03/2022)   Hunger Vital Sign    Worried About Running Out of Food in the Last Year: Never true    Ran Out of Food in the Last Year: Never true  Transportation Needs: No Transportation Needs (10/03/2022)   PRAPARE - Administrator, Civil Service (Medical): No    Lack of Transportation (Non-Medical): No  Physical Activity: Not on file  Stress: Not on file  Social Connections: Not on file  Intimate Partner Violence: Not At Risk (02/26/2019)   Humiliation, Afraid, Rape, and Kick questionnaire    Fear of Current or Ex-Partner: No    Emotionally Abused: No    Physically Abused: No    Sexually  Abused: No    Family History  Problem Relation Age of Onset   Colon cancer Mother 56       d. 30   Hypertension Father    Diabetes Maternal Aunt    Diabetes Maternal Uncle    Mental retardation Maternal Uncle    Diabetes Paternal Aunt    Diabetes Maternal Grandmother    Stroke Paternal Grandmother        c. 73   Leukemia Paternal Grandfather        d. 49; ?CLL   Leukemia Cousin 4       mat first cousin   Lymphoma Cousin 40       pat first cousin     Current Outpatient Medications:    azithromycin (ZITHROMAX Z-PAK) 250 MG tablet, Take as directed, Disp: 6 each, Rfl: 0   magic mouthwash (lidocaine, diphenhydrAMINE, alum & mag hydroxide) suspension, Swish and swallow 5 mLs 4 (four) times daily as needed for mouth pain., Disp: 360 mL, Rfl: 0   sucralfate (CARAFATE) 1 g tablet, Dissolve in 8 oz of water then drink every 8 hours, Disp: 12 tablet, Rfl: 0   ALPRAZolam (XANAX) 0.25 MG tablet, 1 tablet Orally once a day if needed for anxiety/panic (not a daily medication, avoid regular use) for 30 days, Disp: , Rfl:    cholecalciferol (VITAMIN D3) 25 MCG (1000 UNIT) tablet, Take 2,000 Units by mouth daily., Disp: , Rfl:    DEXAMETHASONE PO, Take by mouth. Taking day prior to treatment and day after treatment, Disp: , Rfl:    hydrochlorothiazide (HYDRODIURIL) 25 MG tablet, TAKE 1 TABLET BY MOUTH EVERY DAY IN THE MORNING FOR 30 DAYS ONCE A DAY, Disp: , Rfl:    KLOR-CON M20 20 MEQ tablet, TAKE 1 TABLET BY MOUTH EVERY DAY, Disp: 90 tablet, Rfl: 1   magic mouthwash w/lidocaine SOLN, Take 5 mLs by mouth 4 (four) times daily as needed for mouth pain., Disp: 240 mL, Rfl: 2   olmesartan (BENICAR) 5 MG tablet, Take by mouth., Disp: , Rfl:    ondansetron (ZOFRAN) 8 MG tablet, Take 1 tablet (8 mg total) by mouth every 8 (eight) hours as needed for nausea or vomiting., Disp: 30 tablet, Rfl: 1   oxyCODONE-acetaminophen (PERCOCET/ROXICET) 5-325 MG tablet, Take 1 tablet by mouth every 6 (six) hours as  needed for severe pain., Disp: 15 tablet, Rfl: 0  PHYSICAL EXAM: ECOG FS:1 - Symptomatic but completely ambulatory    Vitals:   05/20/23 0909  BP: 126/76  Pulse: 73  Resp: 16  Temp: 98.7 F (37.1 C)  TempSrc: Oral  SpO2: 97%   Physical Exam Vitals and nursing note reviewed.  Constitutional:      Appearance: She is not ill-appearing or toxic-appearing.  HENT:     Head: Normocephalic.     Mouth/Throat:     Mouth: Mucous membranes are moist. No oral lesions.     Tongue: No lesions.     Pharynx: Oropharynx is clear. Uvula midline. No pharyngeal swelling, oropharyngeal exudate, posterior oropharyngeal erythema or uvula swelling.     Tonsils: No tonsillar exudate or tonsillar abscesses.  Eyes:     Conjunctiva/sclera: Conjunctivae normal.  Cardiovascular:     Rate and Rhythm: Normal rate and regular rhythm.     Pulses: Normal pulses.     Heart sounds: Normal heart sounds.  Pulmonary:     Effort: Pulmonary effort is normal.     Breath sounds: Normal breath sounds.  Abdominal:     General: There is no distension.  Musculoskeletal:     Cervical back: Normal range of motion.  Lymphadenopathy:     Head:     Right side of head: No submandibular adenopathy.     Left side of head: No submandibular adenopathy.     Cervical:     Right cervical: No superficial cervical adenopathy.    Left cervical: No superficial cervical adenopathy.  Skin:    General: Skin is warm and dry.  Neurological:     Mental Status: She is alert.        LABORATORY DATA: I have reviewed the data as listed    Latest Ref Rng & Units 05/09/2023   10:19 AM 04/17/2023    9:04 AM 03/28/2023   10:30 AM  CBC  WBC 4.0 - 10.5 K/uL 11.5  12.8  9.8   Hemoglobin 12.0 - 15.0 g/dL 57.8  46.9  62.9   Hematocrit 36.0 - 46.0 % 35.2  37.9  36.4   Platelets 150 - 400 K/uL 251  222  257         Latest Ref Rng & Units 05/09/2023   10:19 AM 04/17/2023    9:04 AM 03/28/2023   10:30 AM  CMP  Glucose 70 - 99 mg/dL 528   413  244   BUN 6 - 20 mg/dL 24  11  19    Creatinine 0.44 - 1.00 mg/dL 0.10  2.72  5.36   Sodium 135 - 145 mmol/L 141  141  141   Potassium 3.5 - 5.1 mmol/L 3.4  4.4  3.5   Chloride 98 - 111 mmol/L 108  107  109   CO2 22 - 32 mmol/L 26  28  26    Calcium 8.9 - 10.3 mg/dL 9.2  9.2  9.0   Total Protein 6.5 - 8.1 g/dL 6.3  7.0  6.9   Total Bilirubin 0.3 - 1.2 mg/dL 0.4  0.4  0.3   Alkaline Phos 38 - 126 U/L 62  56  52   AST 15 - 41 U/L 16  11  12    ALT 0 - 44 U/L 23  11  13         RADIOGRAPHIC STUDIES (from last 24 hours if applicable) I have personally reviewed the radiological images as listed and agreed with the findings in the report. No results found.      Visit Diagnosis: 1. Carcinoma of breast metastatic to liver, unspecified laterality (HCC)   2. Sore throat  No orders of the defined types were placed in this encounter.   All questions were answered. The patient knows to call the clinic with any problems, questions or concerns. No barriers to learning was detected.  A total of more than 30 minutes were spent on this encounter with face-to-face time and non-face-to-face time, including preparing to see the patient, ordering tests and/or medications, counseling the patient and coordination of care as outlined above.    Thank you for allowing me to participate in the care of this patient.    Shanon Ace, PA-C Department of Hematology/Oncology [facility] Phone: [phone]  [phone]    05/20/2023 11:23 AM

## 2023-05-19 NOTE — Telephone Encounter (Signed)
Received call from pt with complaint of worsening sore throat and congestion despite Zpak.  Per MD pt needing to be seen by [facility] for further evaluation and tx.  Appt scheduled, pt educated and verbalized understanding.

## 2023-05-20 ENCOUNTER — Encounter: Payer: Self-pay | Admitting: Hematology and Oncology

## 2023-05-20 ENCOUNTER — Other Ambulatory Visit: Payer: Self-pay

## 2023-05-20 ENCOUNTER — Other Ambulatory Visit (HOSPITAL_COMMUNITY): Payer: Self-pay

## 2023-05-20 ENCOUNTER — Inpatient Hospital Stay (HOSPITAL_BASED_OUTPATIENT_CLINIC_OR_DEPARTMENT_OTHER): Payer: BC Managed Care – PPO | Admitting: Physician Assistant

## 2023-05-20 VITALS — BP 126/76 | HR 73 | Temp 98.7°F | Resp 16

## 2023-05-20 DIAGNOSIS — C787 Secondary malignant neoplasm of liver and intrahepatic bile duct: Secondary | ICD-10-CM

## 2023-05-20 DIAGNOSIS — Z5112 Encounter for antineoplastic immunotherapy: Secondary | ICD-10-CM | POA: Diagnosis not present

## 2023-05-20 DIAGNOSIS — C50919 Malignant neoplasm of unspecified site of unspecified female breast: Secondary | ICD-10-CM

## 2023-05-20 DIAGNOSIS — Z170 Estrogen receptor positive status [ER+]: Secondary | ICD-10-CM | POA: Diagnosis not present

## 2023-05-20 DIAGNOSIS — D0511 Intraductal carcinoma in situ of right breast: Secondary | ICD-10-CM | POA: Diagnosis not present

## 2023-05-20 DIAGNOSIS — J029 Acute pharyngitis, unspecified: Secondary | ICD-10-CM

## 2023-05-20 DIAGNOSIS — Z856 Personal history of leukemia: Secondary | ICD-10-CM | POA: Diagnosis not present

## 2023-05-20 DIAGNOSIS — Z923 Personal history of irradiation: Secondary | ICD-10-CM | POA: Diagnosis not present

## 2023-05-20 DIAGNOSIS — Z87891 Personal history of nicotine dependence: Secondary | ICD-10-CM | POA: Diagnosis not present

## 2023-05-20 DIAGNOSIS — Z79899 Other long term (current) drug therapy: Secondary | ICD-10-CM | POA: Diagnosis not present

## 2023-05-20 MED ORDER — LIDOCAINE VISCOUS HCL 2 % MT SOLN
5.0000 mL | Freq: Four times a day (QID) | OROMUCOSAL | 0 refills | Status: DC | PRN
  Filled 2023-05-20: qty 360, 14d supply, fill #0

## 2023-05-20 MED ORDER — SUCRALFATE 1 G PO TABS
ORAL_TABLET | ORAL | 0 refills | Status: DC
  Filled 2023-05-20: qty 12, 4d supply, fill #0

## 2023-05-20 NOTE — Patient Instructions (Signed)
If magic mouthwash does not help your symptoms you can try taking over the counter pepcid incase this is acid reflux causing the pain.

## 2023-05-21 ENCOUNTER — Other Ambulatory Visit (HOSPITAL_COMMUNITY): Payer: Self-pay

## 2023-05-22 ENCOUNTER — Other Ambulatory Visit (HOSPITAL_COMMUNITY): Payer: Self-pay

## 2023-05-24 NOTE — Progress Notes (Signed)
Patient Care Team: [name], MD as PCP - General (Family Medicine) Donnelly Angelica, RN as Oncology Nurse Navigator Pershing Proud, RN as Oncology Nurse Navigator Serena Croissant, MD as Consulting Physician (Hematology and Oncology) Default, Provider, MD as Technician  DIAGNOSIS:  Encounter Diagnoses  Name Primary?   Carcinoma of breast metastatic to liver, unspecified laterality (HCC) Yes   Cardiotoxicity (HCC)     SUMMARY OF ONCOLOGIC HISTORY: Oncology History  Breast cancer metastasized to liver (HCC)  01/25/2019 Initial Diagnosis   Screening mammogram showed bilateral calcifications right breast 5 mm, pleomorphic.  Left breast 1.6 cm punctate, smaller group of calcifications spanning 3 mm.  Right lower outer quadrant biopsy revealed low-grade DCIS ER 90%, PR 70%; biopsy of left breast calcifications ALH and PASH   01/31/2019 Genetic Testing   ATM c.8495G>A and RECQL4 c.2587G>A VUS identified on the 9-gene STAT panel and Multi-cancer panel through [facility].  The STAT Breast cancer panel offered by [facility] includes sequencing and rearrangement analysis for the following 9 genes:  ATM, BRCA1, BRCA2, CDH1, CHEK2, PALB2, PTEN, STK11 and TP53.   The Multi-Gene Panel offered by [facility] includes sequencing and/or deletion duplication testing of the following 84 genes: AIP, ALK, APC, ATM, AXIN2,BAP1,  BARD1, BLM, BMPR1A, BRCA1, BRCA2, BRIP1, CASR, CDC73, CDH1, CDK4, CDKN1B, CDKN1C, CDKN2A (p14ARF), CDKN2A (p16INK4a), CEBPA, CHEK2, CTNNA1, DICER1, DIS3L2, EGFR (c.2369C>T, p.Thr790Met variant only), EPCAM (Deletion/duplication testing only), FH, FLCN, GATA2, GPC3, GREM1 (Promoter region deletion/duplication testing only), HOXB13 (c.251G>A, p.Gly84Glu), HRAS, KIT, MAX, MEN1, MET, MITF (c.952G>A, p.Glu318Lys variant only), MLH1, MSH2, MSH3, MSH6, MUTYH, NBN, NF1, NF2, NTHL1, PALB2, PDGFRA, PHOX2B, PMS2, POLD1, POLE, POT1, PRKAR1A, PTCH1, PTEN, RAD50, RAD51C, RAD51D, RB1, RECQL4, RET, RUNX1, SDHAF2,  SDHA (sequence changes only), SDHB, SDHC, SDHD, SMAD4, SMARCA4, SMARCB1, SMARCE1, STK11, SUFU, TERC, TERT, TMEM127, TP53, TSC1, TSC2, VHL, WRN and WT1.  he report date is February 02, 2019.   02/11/2019 Surgery   RT lumpectomy: DICS Intermediate grade, Margins Neg, ER 90%, PR 70%; Lt Lumpectomy: UDH   02/26/2019 -  Anti-estrogen oral therapy   Tamoxifen 20mg  daily, stopped because of depression and facial numbness   05/20/2019 - 06/17/2019 Radiation Therapy   Adjuvant XRT   08/15/2021 Relapse/Recurrence   MRI surveillance detected Right breast calcifications 5 cm, Ant biopsy: IG DCIS with necrosis, ER 40%, PR 0%, Posterior biopsy: Intermediate grade DCIS with [facility]   08/19/2022 Cancer Staging   Staging form: Breast, AJCC 8th Edition - Clinical: Stage IV (cT41mi, cNX, cM1, G3, ER: Unknown, PR: Unknown, HER2: Unknown) - Signed by Serena Croissant, MD on 02/06/2023 Histologic grading system: 3 grade system   02/14/2023 -  Chemotherapy   Patient is on Treatment Plan : BREAST DOCEtaxel + Trastuzumab + Pertuzumab (THP) q21d x 8 cycles / Trastuzumab + Pertuzumab q21d x 4 cycles     AML (acute myeloid leukemia) (HCC)  09/1998 Initial Diagnosis   AML (acute myeloid leukemia) (HCC) treated with 2 induction regimens followed by 6 consolidation regimens, remission     CHIEF COMPLIANT: Taxotere Herceptin Perjeta cycle 6   INTERVAL HISTORY: Joann Meadows is a 47 y.o. with above-mentioned history of right breast DCIS for which she underwent a lumpectomy. She presents to the clinic today for a follow-up. Pt reports she tolerated last treatment. She does have soreness in throat and some drainage. States that bowels are doing good. Denies any nausea and no numbness or tingling of the fingers or toes. She says she does have some stiffness, but not all the  time.   ALLERGIES:  is allergic to levaquin [levofloxacin], tamoxifen citrate, and tape.  MEDICATIONS:  Current Outpatient Medications  Medication Sig Dispense  Refill   ALPRAZolam (XANAX) 0.25 MG tablet 1 tablet Orally once a day if needed for anxiety/panic (not a daily medication, avoid regular use) for 30 days     azithromycin (ZITHROMAX Z-PAK) 250 MG tablet Take as directed 6 each 0   cholecalciferol (VITAMIN D3) 25 MCG (1000 UNIT) tablet Take 2,000 Units by mouth daily.     DEXAMETHASONE PO Take by mouth. Taking day prior to treatment and day after treatment     hydrochlorothiazide (HYDRODIURIL) 25 MG tablet TAKE 1 TABLET BY MOUTH EVERY DAY IN THE MORNING FOR 30 DAYS ONCE A DAY     KLOR-CON M20 20 MEQ tablet TAKE 1 TABLET BY MOUTH EVERY DAY 90 tablet 1   magic mouthwash (lidocaine, diphenhydrAMINE, alum & mag hydroxide) suspension Swish and swallow 5 mLs 4 (four) times daily as needed for mouth pain. 360 mL 0   magic mouthwash w/lidocaine SOLN Take 5 mLs by mouth 4 (four) times daily as needed for mouth pain. 240 mL 2   olmesartan (BENICAR) 5 MG tablet Take by mouth.     ondansetron (ZOFRAN) 8 MG tablet Take 1 tablet (8 mg total) by mouth every 8 (eight) hours as needed for nausea or vomiting. 30 tablet 1   oxyCODONE-acetaminophen (PERCOCET/ROXICET) 5-325 MG tablet Take 1 tablet by mouth every 6 (six) hours as needed for severe pain. 15 tablet 0   sucralfate (CARAFATE) 1 g tablet Dissolve 1 tablet in 8 oz of water then drink every 8 hours 12 tablet 0   No current facility-administered medications for this visit.    PHYSICAL EXAMINATION: ECOG PERFORMANCE STATUS: 1 - Symptomatic but completely ambulatory  Vitals:   05/28/23 0959  BP: (!) 144/87  Pulse: 77  Resp: 18  Temp: (!) 97.3 F (36.3 C)  SpO2: 97%   Filed Weights   05/28/23 0959  Weight: 171 lb 12.8 oz (77.9 kg)      LABORATORY DATA:  I have reviewed the data as listed    Latest Ref Rng & Units 05/09/2023   10:19 AM 04/17/2023    9:04 AM 03/28/2023   10:30 AM  CMP  Glucose 70 - 99 mg/dL 401  027  253   BUN 6 - 20 mg/dL 24  11  19    Creatinine 0.44 - 1.00 mg/dL 6.64  4.03   4.74   Sodium 135 - 145 mmol/L 141  141  141   Potassium 3.5 - 5.1 mmol/L 3.4  4.4  3.5   Chloride 98 - 111 mmol/L 108  107  109   CO2 22 - 32 mmol/L 26  28  26    Calcium 8.9 - 10.3 mg/dL 9.2  9.2  9.0   Total Protein 6.5 - 8.1 g/dL 6.3  7.0  6.9   Total Bilirubin 0.3 - 1.2 mg/dL 0.4  0.4  0.3   Alkaline Phos 38 - 126 U/L 62  56  52   AST 15 - 41 U/L 16  11  12    ALT 0 - 44 U/L 23  11  13      Lab Results  Component Value Date   WBC 7.8 05/28/2023   HGB 12.3 05/28/2023   HCT 38.2 05/28/2023   MCV 86.6 05/28/2023   PLT 250 05/28/2023   NEUTROABS 5.1 05/28/2023    ASSESSMENT & PLAN:  Breast cancer metastasized  to liver (HCC) 02/11/19: RT lumpectomy: DICS Intermediate grade, Margins Neg, ER 90%, PR 70%; Lt Lumpectomy: UDH status post radiation, could not tolerate tamoxifen recurrence: 08/15/2021:MRI surveillance detected Right breast calcifications 5 cm, Ant biopsy: IG DCIS with necrosis, ER 40%, PR 0%, Posterior biopsy: Intermediate grade DCIS with [facility]  11/29/2021: Right mastectomy: High-grade DCIS with microinvasion, ER 0%, PR 0%, HER2 positive, 0/1 lymph node negative  T1c MIC, N0: Stage Ia   Patient had second opinion at [facility] with Dr. Creig Hines who agreed with her plan of no systemic chemotherapy   Breast cancer surveillance: 1.  Mammogram 08/22/2022: Left breast benign breast density category B  2.  CT CAP 12/09/2022: Multiple liver lesions indicative of cysts and hemangiomas new right hepatic lobe lesion favored to be hemangioma otherwise no evidence of metastatic disease.  Radiology recommended an MRI of the liver. 3.  Liver MRI 01/08/2023: 2 New rim-enhancing lesions 2.3 cm (was 1.6 cm on 12/09/2022), 1.8 cm (measured 1.5 cm), benign hemangioma 2.4 cm and 1.9 cm and 1.3 cm unchanged 4.  PET CT scan 01/22/2023: Liver: 2.5 cm and 2.4 cm  lesions ------------------------------------------------------------------------------------------------------------------------------------------------- 01/31/2023: Liver lesion biopsy: Metastatic adenocarcinoma to the liver consistent with breast primary positive for CK7 and GATA3, ER 0%, PR 0%, Ki-67 25%, HER2 3+ positive, Caris molecular testing is pending   Current treatment: Taxotere Herceptin Perjeta cycle 6 on 05/30/2023 Chemo toxicities: Muscle cramps: Due to hypokalemia: Replaced, no further issues Mouth sores: Resolved Taste changes Alopecia Sore throat: Probably due to mucositis.  She has Magic mouthwash.  Previously treated with 2 rounds of antibiotics.   She did very well with the reduced dosage of Taxotere.   She saw Dr. Roland Earl at [facility] for second opinion who agreed with the treatment plan.   Meningioma: Follows with Dr. Barbaraann Cao  CT CAP 04/17/2023: Slight progression of metastatic lesions in the liver 1.6 cm (was 2.3 cm), 1.4 cm (was previously 1.8 cm), previously noted small pulmonary nodules 4 mm or less felt to be benign   Plan is to obtain PET CT scan in 3 weeks and then make a decision whether or not to continue with Taxotere or move onto maintenance Herceptin and Perjeta. Echocardiogram will be ordered.  It is okay to treat her without a current echocardiogram.    Orders Placed This Encounter  Procedures   NM PET Image Restag (PS) Skull Base To Thigh    Standing Status:   Future    Standing Expiration Date:   05/27/2024    Order Specific Question:   If indicated for the ordered procedure, I authorize the administration of a radiopharmaceutical per Radiology protocol    Answer:   Yes    Order Specific Question:   Is the patient pregnant?    Answer:   No    Order Specific Question:   Preferred imaging location?    Answer:   [facility]    Order Specific Question:   Release to patient    Answer:   Immediate   ECHOCARDIOGRAM COMPLETE    Standing Status:   Future     Standing Expiration Date:   05/27/2024    Scheduling Instructions:     She would like to do it on a Monday    Order Specific Question:   Where should this test be performed    Answer:   [facility]    Order Specific Question:   Perflutren DEFINITY (image enhancing agent) should be administered unless hypersensitivity or allergy exist  Answer:   Administer Perflutren    Order Specific Question:   Reason for exam-Echo    Answer:   Chemo  Z09   The patient has a good understanding of the overall plan. she agrees with it. she will call with any problems that may develop before the next visit here. Total time spent: 30 mins including face to face time and time spent for planning, charting and co-ordination of care   Tamsen Meek, MD 05/28/23    I Janan Ridge am acting as a Neurosurgeon for [employer]  I have reviewed the above documentation for accuracy and completeness, and I agree with the above.

## 2023-05-28 ENCOUNTER — Inpatient Hospital Stay: Payer: BC Managed Care – PPO | Admitting: Hematology and Oncology

## 2023-05-28 ENCOUNTER — Other Ambulatory Visit: Payer: Self-pay

## 2023-05-28 ENCOUNTER — Inpatient Hospital Stay: Payer: BC Managed Care – PPO

## 2023-05-28 ENCOUNTER — Inpatient Hospital Stay: Payer: BC Managed Care – PPO | Attending: Hematology and Oncology

## 2023-05-28 ENCOUNTER — Telehealth: Payer: Self-pay | Admitting: Hematology and Oncology

## 2023-05-28 VITALS — BP 144/87 | HR 77 | Temp 97.3°F | Resp 18 | Ht 65.0 in | Wt 171.8 lb

## 2023-05-28 DIAGNOSIS — C50919 Malignant neoplasm of unspecified site of unspecified female breast: Secondary | ICD-10-CM | POA: Diagnosis not present

## 2023-05-28 DIAGNOSIS — I427 Cardiomyopathy due to drug and external agent: Secondary | ICD-10-CM

## 2023-05-28 DIAGNOSIS — D0511 Intraductal carcinoma in situ of right breast: Secondary | ICD-10-CM | POA: Diagnosis not present

## 2023-05-28 DIAGNOSIS — Z9011 Acquired absence of right breast and nipple: Secondary | ICD-10-CM | POA: Diagnosis not present

## 2023-05-28 DIAGNOSIS — C787 Secondary malignant neoplasm of liver and intrahepatic bile duct: Secondary | ICD-10-CM | POA: Diagnosis not present

## 2023-05-28 DIAGNOSIS — Z5112 Encounter for antineoplastic immunotherapy: Secondary | ICD-10-CM | POA: Diagnosis not present

## 2023-05-28 DIAGNOSIS — E876 Hypokalemia: Secondary | ICD-10-CM | POA: Insufficient documentation

## 2023-05-28 DIAGNOSIS — Z170 Estrogen receptor positive status [ER+]: Secondary | ICD-10-CM | POA: Diagnosis not present

## 2023-05-28 DIAGNOSIS — Z923 Personal history of irradiation: Secondary | ICD-10-CM | POA: Diagnosis not present

## 2023-05-28 DIAGNOSIS — Z79899 Other long term (current) drug therapy: Secondary | ICD-10-CM | POA: Diagnosis not present

## 2023-05-28 LAB — CMP (CANCER CENTER ONLY)
ALT: 19 U/L (ref 0–44)
AST: 16 U/L (ref 15–41)
Albumin: 3.9 g/dL (ref 3.5–5.0)
Alkaline Phosphatase: 68 U/L (ref 38–126)
Anion gap: 4 — ABNORMAL LOW (ref 5–15)
BUN: 15 mg/dL (ref 6–20)
CO2: 29 mmol/L (ref 22–32)
Calcium: 9.4 mg/dL (ref 8.9–10.3)
Chloride: 107 mmol/L (ref 98–111)
Creatinine: 0.71 mg/dL (ref 0.44–1.00)
GFR, Estimated: >60 mL/min (ref >60)
Glucose, Bld: 92 mg/dL (ref 70–99)
Potassium: 4.7 mmol/L (ref 3.5–5.1)
Sodium: 140 mmol/L (ref 135–145)
Total Bilirubin: 0.3 mg/dL (ref 0.3–1.2)
Total Protein: 6.4 g/dL — ABNORMAL LOW (ref 6.5–8.1)

## 2023-05-28 LAB — CBC WITH DIFFERENTIAL (CANCER CENTER ONLY)
Abs Immature Granulocytes: 0.04 10*3/uL (ref 0.00–0.07)
Basophils Absolute: 0.1 10*3/uL (ref 0.0–0.1)
Basophils Relative: 1 %
Eosinophils Absolute: 0.1 10*3/uL (ref 0.0–0.5)
Eosinophils Relative: 1 %
HCT: 38.2 % (ref 36.0–46.0)
Hemoglobin: 12.3 g/dL (ref 12.0–15.0)
Immature Granulocytes: 1 %
Lymphocytes Relative: 23 %
Lymphs Abs: 1.7 10*3/uL (ref 0.7–4.0)
MCH: 27.9 pg (ref 26.0–34.0)
MCHC: 32.2 g/dL (ref 30.0–36.0)
MCV: 86.6 fL (ref 80.0–100.0)
Monocytes Absolute: 0.8 10*3/uL (ref 0.1–1.0)
Monocytes Relative: 10 %
Neutro Abs: 5.1 10*3/uL (ref 1.7–7.7)
Neutrophils Relative %: 64 %
Platelet Count: 250 10*3/uL (ref 150–400)
RBC: 4.41 MIL/uL (ref 3.87–5.11)
RDW: 15.6 % — ABNORMAL HIGH (ref 11.5–15.5)
WBC Count: 7.8 10*3/uL (ref 4.0–10.5)
nRBC: 0 % (ref 0.0–0.2)

## 2023-05-28 NOTE — Progress Notes (Signed)
Patient request to have her labs drawn peripherally.  She states that she has treatment on Friday 05/30/23 and she does not want to access the port in that short time frame.  This nurse scheduled a lab appointment and escorted patient to the lab.  No further questions or concerns noted.

## 2023-05-28 NOTE — Telephone Encounter (Signed)
Scheduled appointments per WQ. Patient is aware of the made appointments.  

## 2023-05-28 NOTE — Assessment & Plan Note (Addendum)
02/11/19: RT lumpectomy: DICS Intermediate grade, Margins Neg, ER 90%, PR 70%; Lt Lumpectomy: UDH status post radiation, could not tolerate tamoxifen recurrence: 08/15/2021:MRI surveillance detected Right breast calcifications 5 cm, Ant biopsy: IG DCIS with necrosis, ER 40%, PR 0%, Posterior biopsy: Intermediate grade DCIS with [facility]  11/29/2021: Right mastectomy: High-grade DCIS with microinvasion, ER 0%, PR 0%, HER2 positive, 0/1 lymph node negative  T1c MIC, N0: Stage Ia   Patient had second opinion at [facility] with Dr. Creig Hines who agreed with her plan of no systemic chemotherapy   Breast cancer surveillance: 1.  Mammogram 08/22/2022: Left breast benign breast density category B  2.  CT CAP 12/09/2022: Multiple liver lesions indicative of cysts and hemangiomas new right hepatic lobe lesion favored to be hemangioma otherwise no evidence of metastatic disease.  Radiology recommended an MRI of the liver. 3.  Liver MRI 01/08/2023: 2 New rim-enhancing lesions 2.3 cm (was 1.6 cm on 12/09/2022), 1.8 cm (measured 1.5 cm), benign hemangioma 2.4 cm and 1.9 cm and 1.3 cm unchanged 4.  PET CT scan 01/22/2023: Liver: 2.5 cm and 2.4 cm lesions ------------------------------------------------------------------------------------------------------------------------------------------------- 01/31/2023: Liver lesion biopsy: Metastatic adenocarcinoma to the liver consistent with breast primary positive for CK7 and GATA3, ER 0%, PR 0%, Ki-67 25%, HER2 3+ positive, Caris molecular testing is pending   Current treatment: Taxotere Herceptin Perjeta cycle 6 on 05/30/2023 Chemo toxicities: Muscle cramps: Due to hypokalemia: Replaced, no further issues Mouth sores: Resolved Taste changes Alopecia Sore throat: Probably due to mucositis.  She has Magic mouthwash.  Previously treated with 2 rounds of antibiotics.   She did very well with the reduced dosage of Taxotere.   She saw Dr. Roland Earl at [facility] for second opinion who  agreed with the treatment plan.   Meningioma: Follows with Dr. Barbaraann Cao  CT CAP 04/17/2023: Slight progression of metastatic lesions in the liver 1.6 cm (was 2.3 cm), 1.4 cm (was previously 1.8 cm), previously noted small pulmonary nodules 4 mm or less felt to be benign   Plan is to obtain PET CT scan in 3 weeks and then make a decision whether or not to continue with Taxotere or move onto maintenance Herceptin and Perjeta. Echocardiogram will be ordered.  It is okay to treat her without a current echocardiogram.

## 2023-05-30 ENCOUNTER — Other Ambulatory Visit: Payer: Self-pay

## 2023-05-30 ENCOUNTER — Other Ambulatory Visit: Payer: Self-pay | Admitting: Hematology and Oncology

## 2023-05-30 ENCOUNTER — Ambulatory Visit: Payer: BC Managed Care – PPO | Admitting: Hematology and Oncology

## 2023-05-30 ENCOUNTER — Inpatient Hospital Stay: Payer: BC Managed Care – PPO

## 2023-05-30 ENCOUNTER — Other Ambulatory Visit: Payer: BC Managed Care – PPO

## 2023-05-30 VITALS — BP 151/90 | HR 80 | Temp 98.1°F | Resp 18 | Wt 171.0 lb

## 2023-05-30 DIAGNOSIS — C50919 Malignant neoplasm of unspecified site of unspecified female breast: Secondary | ICD-10-CM

## 2023-05-30 DIAGNOSIS — Z9011 Acquired absence of right breast and nipple: Secondary | ICD-10-CM | POA: Diagnosis not present

## 2023-05-30 DIAGNOSIS — Z170 Estrogen receptor positive status [ER+]: Secondary | ICD-10-CM | POA: Diagnosis not present

## 2023-05-30 DIAGNOSIS — C787 Secondary malignant neoplasm of liver and intrahepatic bile duct: Secondary | ICD-10-CM | POA: Diagnosis not present

## 2023-05-30 DIAGNOSIS — Z923 Personal history of irradiation: Secondary | ICD-10-CM | POA: Diagnosis not present

## 2023-05-30 DIAGNOSIS — Z5112 Encounter for antineoplastic immunotherapy: Secondary | ICD-10-CM | POA: Diagnosis not present

## 2023-05-30 DIAGNOSIS — E876 Hypokalemia: Secondary | ICD-10-CM | POA: Diagnosis not present

## 2023-05-30 DIAGNOSIS — Z79899 Other long term (current) drug therapy: Secondary | ICD-10-CM | POA: Diagnosis not present

## 2023-05-30 DIAGNOSIS — D0511 Intraductal carcinoma in situ of right breast: Secondary | ICD-10-CM | POA: Diagnosis not present

## 2023-05-30 LAB — PREGNANCY, URINE: Preg Test, Ur: NEGATIVE

## 2023-05-30 MED ORDER — SODIUM CHLORIDE 0.9 % IV SOLN
65.0000 mg/m2 | Freq: Once | INTRAVENOUS | Status: AC
  Administered 2023-05-30: 121 mg via INTRAVENOUS
  Filled 2023-05-30: qty 12.1

## 2023-05-30 MED ORDER — SODIUM CHLORIDE 0.9 % IV SOLN: Freq: Once | INTRAVENOUS | Status: AC

## 2023-05-30 MED ORDER — ACETAMINOPHEN 325 MG PO TABS
650.0000 mg | ORAL_TABLET | Freq: Once | ORAL | Status: AC
  Administered 2023-05-30: 650 mg via ORAL
  Filled 2023-05-30: qty 2

## 2023-05-30 MED ORDER — SODIUM CHLORIDE 0.9% FLUSH
10.0000 mL | INTRAVENOUS | Status: DC | PRN
  Administered 2023-05-30: 10 mL

## 2023-05-30 MED ORDER — TRASTUZUMAB-ANNS CHEMO 150 MG IV SOLR
6.0000 mg/kg | Freq: Once | INTRAVENOUS | Status: AC
  Administered 2023-05-30: 420 mg via INTRAVENOUS
  Filled 2023-05-30: qty 20

## 2023-05-30 MED ORDER — DEXAMETHASONE SODIUM PHOSPHATE 10 MG/ML IJ SOLN
4.0000 mg | Freq: Once | INTRAMUSCULAR | Status: AC
  Administered 2023-05-30: 4 mg via INTRAVENOUS
  Filled 2023-05-30: qty 1

## 2023-05-30 MED ORDER — DIPHENHYDRAMINE HCL 25 MG PO CAPS
50.0000 mg | ORAL_CAPSULE | Freq: Once | ORAL | Status: AC
  Administered 2023-05-30: 50 mg via ORAL
  Filled 2023-05-30: qty 2

## 2023-05-30 MED ORDER — HEPARIN SOD (PORK) LOCK FLUSH 100 UNIT/ML IV SOLN
500.0000 [IU] | Freq: Once | INTRAVENOUS | Status: AC | PRN
  Administered 2023-05-30: 500 [IU]

## 2023-05-30 MED ORDER — SODIUM CHLORIDE 0.9 % IV SOLN
420.0000 mg | Freq: Once | INTRAVENOUS | Status: AC
  Administered 2023-05-30: 420 mg via INTRAVENOUS
  Filled 2023-05-30: qty 14

## 2023-05-30 NOTE — Progress Notes (Signed)
Error

## 2023-05-30 NOTE — Progress Notes (Signed)
Pt observed for 30 min post Perjeta infusion. Denies adverse s/s. No hx adverse s/s. Proceeding with remainder of treatment today.

## 2023-05-30 NOTE — Patient Instructions (Signed)
[facility]  Discharge Instructions: Thank you for choosing [facility] to provide your oncology and hematology care.   If you have a lab appointment with the Cancer Center, please go directly to the Cancer Center and check in at the registration area.   Wear comfortable clothing and clothing appropriate for easy access to any Portacath or PICC line.   We strive to give you quality time with your provider. You may need to reschedule your appointment if you arrive late (15 or more minutes).  Arriving late affects you and other patients whose appointments are after yours.  Also, if you miss three or more appointments without notifying the office, you may be dismissed from the clinic at the provider's discretion.      For prescription refill requests, have your pharmacy contact our office and allow 72 hours for refills to be completed.    Today you received the following chemotherapy and/or immunotherapy agents: trastuzumab, pertuzumab, and docetaxel      To help prevent nausea and vomiting after your treatment, we encourage you to take your nausea medication as directed.  BELOW ARE SYMPTOMS THAT SHOULD BE REPORTED IMMEDIATELY: *FEVER GREATER THAN 100.4 F (38 C) OR HIGHER *CHILLS OR SWEATING *NAUSEA AND VOMITING THAT IS NOT CONTROLLED WITH YOUR NAUSEA MEDICATION *UNUSUAL SHORTNESS OF BREATH *UNUSUAL BRUISING OR BLEEDING *URINARY PROBLEMS (pain or burning when urinating, or frequent urination) *BOWEL PROBLEMS (unusual diarrhea, constipation, pain near the anus) TENDERNESS IN MOUTH AND THROAT WITH OR WITHOUT PRESENCE OF ULCERS (sore throat, sores in mouth, or a toothache) UNUSUAL RASH, SWELLING OR PAIN  UNUSUAL VAGINAL DISCHARGE OR ITCHING   Items with * indicate a potential emergency and should be followed up as soon as possible or go to the Emergency Department if any problems should occur.  Please show the CHEMOTHERAPY ALERT CARD or  IMMUNOTHERAPY ALERT CARD at check-in to the Emergency Department and triage nurse.  Should you have questions after your visit or need to cancel or reschedule your appointment, please contact [facility]  Dept: [phone]  and follow the prompts.  Office hours are 8:00 a.m. to 4:30 p.m. Monday - Friday. Please note that voicemails left after 4:00 p.m. may not be returned until the following business day.  We are closed weekends and major holidays. You have access to a nurse at all times for urgent questions. Please call the main number to the clinic Dept: [phone] and follow the prompts.   For any non-urgent questions, you may also contact your provider using MyChart. We now offer e-Visits for anyone 18 and older to request care online for non-urgent symptoms. For details visit [email or web address].   Also download the MyChart app! Go to the app store, search "MyChart", open the app, select [facility], and log in with your MyChart username and password.   

## 2023-06-02 ENCOUNTER — Telehealth: Payer: Self-pay

## 2023-06-02 ENCOUNTER — Ambulatory Visit (HOSPITAL_COMMUNITY)
Admission: RE | Admit: 2023-06-02 | Discharge: 2023-06-02 | Disposition: A | Discharge status: Home / Self Care | Payer: BC Managed Care – PPO | Source: Ambulatory Visit | Attending: Hematology and Oncology | Admitting: Hematology and Oncology

## 2023-06-02 DIAGNOSIS — C787 Secondary malignant neoplasm of liver and intrahepatic bile duct: Secondary | ICD-10-CM

## 2023-06-02 DIAGNOSIS — C50919 Malignant neoplasm of unspecified site of unspecified female breast: Secondary | ICD-10-CM | POA: Diagnosis not present

## 2023-06-02 DIAGNOSIS — Z01818 Encounter for other preprocedural examination: Secondary | ICD-10-CM | POA: Diagnosis not present

## 2023-06-02 DIAGNOSIS — J449 Chronic obstructive pulmonary disease, unspecified: Secondary | ICD-10-CM | POA: Insufficient documentation

## 2023-06-02 DIAGNOSIS — Z87891 Personal history of nicotine dependence: Secondary | ICD-10-CM | POA: Insufficient documentation

## 2023-06-02 DIAGNOSIS — I427 Cardiomyopathy due to drug and external agent: Secondary | ICD-10-CM

## 2023-06-02 DIAGNOSIS — I10 Essential (primary) hypertension: Secondary | ICD-10-CM | POA: Diagnosis not present

## 2023-06-02 LAB — ECHOCARDIOGRAM COMPLETE
AR max vel: 2.06 cm2
AV Area VTI: 2.12 cm2
AV Area mean vel: 2.07 cm2
AV Mean grad: 4 mmHg
AV Peak grad: 6.8 mmHg
Ao pk vel: 1.3 m/s
Area-P 1/2: 3.77 cm2
Calc EF: 52.9 %
S' Lateral: 3.2 cm
Single Plane A2C EF: 53 %
Single Plane A4C EF: 52.4 %

## 2023-06-02 MED ORDER — AMOXICILLIN 500 MG PO TABS: 500.0000 mg | ORAL_TABLET | Freq: Two times a day (BID) | ORAL | 0 refills | Status: DC

## 2023-06-02 NOTE — Telephone Encounter (Signed)
Pt called and c/o left ear pain and popping X 2 days. She states she feels she has an ear infection. S/w MD and he ordered Amoxicillin 500 mg BID x 7 days. Pt is aware and verbalized thanks.

## 2023-06-06 ENCOUNTER — Encounter (HOSPITAL_COMMUNITY): Payer: Self-pay

## 2023-06-06 ENCOUNTER — Emergency Department (HOSPITAL_COMMUNITY)
Admission: EM | Admit: 2023-06-06 | Discharge: 2023-06-06 | Disposition: A | Discharge status: Home / Self Care | Payer: BC Managed Care – PPO | Attending: Emergency Medicine | Admitting: Emergency Medicine

## 2023-06-06 ENCOUNTER — Other Ambulatory Visit: Payer: Self-pay

## 2023-06-06 ENCOUNTER — Ambulatory Visit (HOSPITAL_COMMUNITY)
Admission: EM | Admit: 2023-06-06 | Discharge: 2023-06-06 | Disposition: A | Discharge status: Home / Self Care | Payer: BC Managed Care – PPO

## 2023-06-06 DIAGNOSIS — H6690 Otitis media, unspecified, unspecified ear: Secondary | ICD-10-CM

## 2023-06-06 DIAGNOSIS — Z853 Personal history of malignant neoplasm of breast: Secondary | ICD-10-CM | POA: Diagnosis not present

## 2023-06-06 DIAGNOSIS — Z8505 Personal history of malignant neoplasm of liver: Secondary | ICD-10-CM | POA: Insufficient documentation

## 2023-06-06 DIAGNOSIS — H9201 Otalgia, right ear: Secondary | ICD-10-CM | POA: Diagnosis not present

## 2023-06-06 DIAGNOSIS — H6691 Otitis media, unspecified, right ear: Secondary | ICD-10-CM | POA: Insufficient documentation

## 2023-06-06 MED ORDER — AMOXICILLIN-POT CLAVULANATE 875-125 MG PO TABS: 1.0000 | ORAL_TABLET | Freq: Two times a day (BID) | ORAL | 0 refills | Status: DC

## 2023-06-06 MED ORDER — OXYCODONE HCL 5 MG PO TABS: 5.0000 mg | ORAL_TABLET | ORAL | 0 refills | Status: DC | PRN

## 2023-06-06 NOTE — ED Triage Notes (Signed)
Pt states right ear pain since this morning.  States she is taking amoxicillin for left ear pain which has resolved.

## 2023-06-06 NOTE — Discharge Instructions (Addendum)
Continue and finish the amoxicillin that was previously prescribed  I would like you to start once daily allergy medicine (zyrtec, allegra, Claritin, etc) Combine with once daily nasal spray such as flonase The combination of these medications should help to reduce any inflammation and pressure in the ear that may be causing paiin  Continue ibuprofen and tylenol for other pain control  Call your primary care provider for follow up

## 2023-06-06 NOTE — ED Provider Notes (Signed)
[place] [facility] Provider Note   CSN: [id number] Arrival date & time: 06/06/23  2135     History  Chief Complaint  Patient presents with   Otalgia    Joann Meadows is a 47 y.o. female.  Patient presents to the emergency department complaining of right-sided ear pain with drainage that began this morning.  Patient is currently taking amoxicillin for left-sided otitis media.  She is on day 5 of antibiotics.  The right-sided ear pain began today.  She was seen by urgent care earlier today and at that time no drainage had been noted.  During the day the patient states she developed drainage and the pain has intensified to 8 out of 10 in severity.  She states that 8 PM tonight she took a leftover oxycodone from her previous surgery with some relief in pain.  She is currently taking Zyrtec as urgent care was concerned she may have a eustachian tube dysfunction.  She denies fevers, nausea, vomiting.  Past medical history significant for leukemia, breast cancer, metastasis to liver  HPI     Home Medications Prior to Admission medications   Medication Sig Start Date End Date Taking? Authorizing Provider  amoxicillin-clavulanate (AUGMENTIN) 875-125 MG tablet Take 1 tablet by mouth every 12 (twelve) hours. 06/06/23  Yes Darrick Grinder, PA-C  oxyCODONE (ROXICODONE) 5 MG immediate release tablet Take 1 tablet (5 mg total) by mouth every 4 (four) hours as needed for severe pain. 06/06/23  Yes Darrick Grinder, PA-C  ALPRAZolam Prudy Feeler) 0.25 MG tablet 1 tablet Orally once a day if needed for anxiety/panic (not a daily medication, avoid regular use) for 30 days 01/14/23   [provider]  amoxicillin (AMOXIL) 500 MG tablet Take 1 tablet (500 mg total) by mouth 2 (two) times daily. 06/02/23   Serena Croissant, MD  azithromycin (ZITHROMAX Z-PAK) 250 MG tablet Take as directed 05/15/23   Serena Croissant, MD  cholecalciferol (VITAMIN D3) 25 MCG (1000 UNIT) tablet  Take 2,000 Units by mouth daily.    [provider]  DEXAMETHASONE PO Take by mouth. Taking day prior to treatment and day after treatment    [provider]  hydrochlorothiazide (HYDRODIURIL) 25 MG tablet TAKE 1 TABLET BY MOUTH EVERY DAY IN THE MORNING FOR 30 DAYS ONCE A DAY 11/04/22   [provider]  KLOR-CON M20 20 MEQ tablet TAKE 1 TABLET BY MOUTH EVERY DAY 05/12/23   Serena Croissant, MD  magic mouthwash (lidocaine, diphenhydrAMINE, alum & mag hydroxide) suspension Swish and swallow 5 mLs 4 (four) times daily as needed for mouth pain. 05/20/23   Shanon Ace, PA-C  magic mouthwash w/lidocaine SOLN Take 5 mLs by mouth 4 (four) times daily as needed for mouth pain. 02/17/23   Serena Croissant, MD  olmesartan (BENICAR) 5 MG tablet Take by mouth. 02/27/23   [provider]  ondansetron (ZOFRAN) 8 MG tablet Take 1 tablet (8 mg total) by mouth every 8 (eight) hours as needed for nausea or vomiting. 02/10/23   Serena Croissant, MD  sucralfate (CARAFATE) 1 g tablet Dissolve 1 tablet in 8 oz of water then drink every 8 hours 05/20/23   Shanon Ace, PA-C      Allergies    Levaquin [levofloxacin], Tamoxifen citrate, and Tape    Review of Systems   Review of Systems  Physical Exam Updated Vital Signs BP (!) 146/98   Pulse 89   Temp 97.8 F (36.6 C) (Oral)  Resp 17   LMP  (LMP Unknown)   SpO2 97%  Physical Exam Vitals and nursing note reviewed.  HENT:     Head: Normocephalic and atraumatic.     Right Ear: Drainage present. Tympanic membrane is erythematous.     Left Ear: Tympanic membrane normal.  Eyes:     Conjunctiva/sclera: Conjunctivae normal.  Cardiovascular:     Rate and Rhythm: Normal rate.  Pulmonary:     Effort: Pulmonary effort is normal. No respiratory distress.  Musculoskeletal:        General: No signs of injury.     Cervical back: Normal range of motion.  Skin:    General: Skin is dry.  Neurological:     Mental Status: She  is alert.  Psychiatric:        Speech: Speech normal.        Behavior: Behavior normal.     ED Results / Procedures / Treatments   Labs (all labs ordered are listed, but only abnormal results are displayed) Labs Reviewed - No data to display  EKG None  Radiology No results found.  Procedures Procedures    Medications Ordered in ED Medications - No data to display  ED Course/ Medical Decision Making/ A&P                             Medical Decision Making  Patient presents to the emergency room with a chief complaint of right-sided ear pain.  Differential diagnosis includes but is not limited to otitis media, otitis externa, mastoiditis, others  I reviewed urgent care notes from earlier today.  Patient has comorbidities including cancer history  Based on physical examination patient does have drainage from the right TM.  I was unable to visualize a perforation in the TM.  There is erythema at the TM as well.  It appears to be otitis media which has apparently been resistant to the amoxicillin she was taking.  Plan to discharge home with course of Augmentin and short course of pain medication.  Patient currently sees ENT, Dr.Teoh, and plans to contact them on Monday to schedule a follow-up appointment.  Return precautions provided.  Discharged home.        Final Clinical Impression(s) / ED Diagnoses Final diagnoses:  Acute otitis media, unspecified otitis media type    Rx / DC Orders ED Discharge Orders          Ordered    amoxicillin-clavulanate (AUGMENTIN) 875-125 MG tablet  Every 12 hours        06/06/23 2245    oxyCODONE (ROXICODONE) 5 MG immediate release tablet  Every 4 hours PRN        06/06/23 2245              Pamala Duffel 06/06/23 2250    Linwood Dibbles, MD 06/08/23 [phone]

## 2023-06-06 NOTE — ED Provider Notes (Signed)
[facility]    CSN: [id number] Arrival date & time: 06/06/23  1838     History   Chief Complaint Chief Complaint  Patient presents with   Otalgia    HPI Joann Meadows is a 47 y.o. female.  Here with right ear pain that started this morning She had left ear pain on 7/8 and contacted her oncologist who sent her amoxicillin BID for 7 days. She is on day 5 and the left ear pain has resolved. However now the right ear hurts  She has taken 2 advil and still having pain, rates 9/10  No fevers. No congestion, runny nose, sore throat, cough  Undergoing chemotherapy for breast cancer   Additionally took courses of levaquin and azithromycin in May/June  Past Medical History:  Diagnosis Date   Breast cancer (HCC) 2020   Right Breast Cancer   Endometriosis    Fibroid    Hx of radiation therapy 06/2019   Leukemia (HCC)    20 years ago   Personal history of radiation therapy 2020   Right Breast Cancer   PONV (postoperative nausea and vomiting)    pt states she vomits after colonoscopies    Patient Active Problem List   Diagnosis Date Noted   Port-A-Cath in place 03/07/2023   Meningioma (HCC) 01/30/2023   Liver lesion 01/23/2023   COPD possible, would be Gold Group B 11/25/2019   DOE (dyspnea on exertion) 11/24/2019   Genetic testing 02/02/2019   Breast cancer metastasized to liver (HCC) 01/25/2019   AML (acute myeloid leukemia) (HCC) 01/25/2019   Family history of colon cancer    Family history of leukemia    Delivery normal 03/25/2017    Past Surgical History:  Procedure Laterality Date   BREAST EXCISIONAL BIOPSY Left 02/11/2019   ALH   BREAST LUMPECTOMY Right 02/11/2019   BREAST LUMPECTOMY WITH RADIOACTIVE SEED LOCALIZATION Bilateral 02/11/2019   Procedure: BILATERAL BREAST LUMPECTOMIES WITH BILATERAL RADIOACTIVE SEED LOCALIZATION;  Surgeon: Manus Rudd, MD;  Location: [facility];  Service: General;  Laterality: Bilateral;   IR  IMAGING GUIDED PORT INSERTION  02/25/2023   IR RADIOLOGIST EVAL & MGMT  02/27/2023   MASTECTOMY Right 11/2021    OB History     Gravida  2   Para  2   Term  2   Preterm      AB      Living  2      SAB      IAB      Ectopic      Multiple  0   Live Births  2            Home Medications    Prior to Admission medications   Medication Sig Start Date End Date Taking? Authorizing Provider  ALPRAZolam Prudy Feeler) 0.25 MG tablet 1 tablet Orally once a day if needed for anxiety/panic (not a daily medication, avoid regular use) for 30 days 01/14/23   [provider]  amoxicillin (AMOXIL) 500 MG tablet Take 1 tablet (500 mg total) by mouth 2 (two) times daily. 06/02/23   Serena Croissant, MD  azithromycin (ZITHROMAX Z-PAK) 250 MG tablet Take as directed 05/15/23   Serena Croissant, MD  cholecalciferol (VITAMIN D3) 25 MCG (1000 UNIT) tablet Take 2,000 Units by mouth daily.    [provider]  DEXAMETHASONE PO Take by mouth. Taking day prior to treatment and day after treatment    [provider]  hydrochlorothiazide (HYDRODIURIL) 25 MG tablet TAKE  1 TABLET BY MOUTH EVERY DAY IN THE MORNING FOR 30 DAYS ONCE A DAY 11/04/22   [provider]  KLOR-CON M20 20 MEQ tablet TAKE 1 TABLET BY MOUTH EVERY DAY 05/12/23   Serena Croissant, MD  magic mouthwash (lidocaine, diphenhydrAMINE, alum & mag hydroxide) suspension Swish and swallow 5 mLs 4 (four) times daily as needed for mouth pain. 05/20/23   Shanon Ace, PA-C  magic mouthwash w/lidocaine SOLN Take 5 mLs by mouth 4 (four) times daily as needed for mouth pain. 02/17/23   Serena Croissant, MD  olmesartan (BENICAR) 5 MG tablet Take by mouth. 02/27/23   [provider]  ondansetron (ZOFRAN) 8 MG tablet Take 1 tablet (8 mg total) by mouth every 8 (eight) hours as needed for nausea or vomiting. 02/10/23   Serena Croissant, MD  sucralfate (CARAFATE) 1 g tablet Dissolve 1 tablet in 8 oz of water then drink every 8  hours 05/20/23   Shanon Ace, PA-C    Family History Family History  Problem Relation Age of Onset   Colon cancer Mother 39       d. 22   Hypertension Father    Diabetes Maternal Aunt    Diabetes Maternal Uncle    Mental retardation Maternal Uncle    Diabetes Paternal Aunt    Diabetes Maternal Grandmother    Stroke Paternal Grandmother        c. 48   Leukemia Paternal Grandfather        d. 25; ?CLL   Leukemia Cousin 4       mat first cousin   Lymphoma Cousin 40       pat first cousin    Social History Social History   Tobacco Use   Smoking status: Former    Current packs/day: 0.00    Types: Cigarettes    Start date: 01/24/2005    Quit date: 01/25/2015    Years since quitting: 8.3   Smokeless tobacco: Never  Vaping Use   Vaping status: Never Used  Substance Use Topics   Alcohol use: No   Drug use: No     Allergies   Levaquin [levofloxacin], Tamoxifen citrate, and Tape   Review of Systems Review of Systems  HENT:  Positive for ear pain.    As per HPI  Physical Exam Triage Vital Signs ED Triage Vitals  Encounter Vitals Group     BP 06/06/23 1901 (!) 141/101     Systolic BP Percentile --      Diastolic BP Percentile --      Pulse Rate 06/06/23 1859 85     Resp 06/06/23 1859 16     Temp 06/06/23 1859 99 F (37.2 C)     Temp Source 06/06/23 1859 Oral     SpO2 06/06/23 1859 97 %     Weight --      Height --      Head Circumference --      Peak Flow --      Pain Score 06/06/23 1901 9     Pain Loc --      Pain Education --      Exclude from Growth Chart --    No data found.  Updated Vital Signs BP (!) 141/101 (BP Location: Left Arm)   Pulse 85   Temp 99 F (37.2 C) (Oral)   Resp 16   LMP  (LMP Unknown)   SpO2 97%    Physical Exam Vitals and nursing note reviewed.  Constitutional:      General: She is not in acute distress.    Appearance: She is not ill-appearing.  HENT:     Right Ear: Tympanic membrane, ear canal and external  ear normal.     Left Ear: Tympanic membrane, ear canal and external ear normal.     Ears:     Comments: Bilat canals and TMs are unremarkable     Mouth/Throat:     Mouth: Mucous membranes are moist.     Pharynx: Oropharynx is clear.  Eyes:     Conjunctiva/sclera: Conjunctivae normal.  Cardiovascular:     Rate and Rhythm: Normal rate and regular rhythm.     Heart sounds: Normal heart sounds.  Pulmonary:     Effort: Pulmonary effort is normal.     Breath sounds: Normal breath sounds.  Musculoskeletal:     Cervical back: Normal range of motion.  Skin:    General: Skin is warm and dry.  Neurological:     Mental Status: She is alert and oriented to person, place, and time.     UC Treatments / Results  Labs (all labs ordered are listed, but only abnormal results are displayed) Labs Reviewed - No data to display  EKG  Radiology No results found.  Procedures Procedures   Medications Ordered in UC Medications - No data to display  Initial Impression / Assessment and Plan / UC Course  I have reviewed the triage vital signs and the nursing notes.  Pertinent labs & imaging results that were available during my care of the patient were reviewed by me and considered in my medical decision making (see chart for details).  Afebrile Ears are unremarkable.  There is no sign of infection. Discussed since she has been taking the amoxicillin for the left ear, the new pain in right ear is likely not bacterial as the abx would have covered it. Suspect some sort of ET dysfunction or pressure behind the ear causing pain. Recommend to start once daily allergy med and nasal spray to reduce any pressure  Patient is requesting something for pain. I have advised she use tylenol and ibuprofen in addition to the other medications I recommended. She requested something stronger twice more and then a fourth time after discharge. Discussed the only stronger medications are narcotics which I will not  be prescribing for acute ear pain in the urgent care setting. She will need to contact her primary care provider or oncologist if she is still having pain despite the therapies I have recommended. Additionally the pain just started this morning and I recommend to try using the allergy med, nasal spray, tylenol, and ibuprofen as these have not been attempted yet.    Final Clinical Impressions(s) / UC Diagnoses   Final diagnoses:  Otalgia of right ear     Discharge Instructions      Continue and finish the amoxicillin that was previously prescribed  I would like you to start once daily allergy medicine (zyrtec, allegra, Claritin, etc) Combine with once daily nasal spray such as flonase The combination of these medications should help to reduce any inflammation and pressure in the ear that may be causing paiin  Continue ibuprofen and tylenol for other pain control  Call your primary care provider for follow up    ED Prescriptions   None    I have reviewed the PDMP during this encounter.   Kathrine Haddock 06/06/23 1951

## 2023-06-06 NOTE — ED Triage Notes (Signed)
Pt. Arrives POV c/o right ear pain x1 day. Pt. Has taken advil and oxycodone for her pain and has not had any relief. Pt. Has had drainage coming out her ear for the last hour. Pt. Is taking antibiotics currently.

## 2023-06-06 NOTE — Discharge Instructions (Signed)
You were evaluated today for a middle ear infection. I have changed your antibiotics to Augmentin. I also prescribed a short course of pain medication. If you need refills you will need to reach out to your primary care team or oncology team. Please schedule a follow up appointment with ENT for further evaluation as needed.

## 2023-06-09 DIAGNOSIS — H66011 Acute suppurative otitis media with spontaneous rupture of ear drum, right ear: Secondary | ICD-10-CM | POA: Diagnosis not present

## 2023-06-09 DIAGNOSIS — H9011 Conductive hearing loss, unilateral, right ear, with unrestricted hearing on the contralateral side: Secondary | ICD-10-CM | POA: Diagnosis not present

## 2023-06-12 ENCOUNTER — Encounter
Admission: RE | Admit: 2023-06-12 | Discharge: 2023-06-12 | Disposition: A | Discharge status: Home / Self Care | Payer: BC Managed Care – PPO | Source: Ambulatory Visit | Attending: Hematology and Oncology | Admitting: Hematology and Oncology

## 2023-06-12 ENCOUNTER — Telehealth: Payer: Self-pay | Admitting: *Deleted

## 2023-06-12 DIAGNOSIS — C50919 Malignant neoplasm of unspecified site of unspecified female breast: Secondary | ICD-10-CM | POA: Insufficient documentation

## 2023-06-12 DIAGNOSIS — C787 Secondary malignant neoplasm of liver and intrahepatic bile duct: Secondary | ICD-10-CM | POA: Insufficient documentation

## 2023-06-12 DIAGNOSIS — K769 Liver disease, unspecified: Secondary | ICD-10-CM | POA: Diagnosis not present

## 2023-06-12 LAB — GLUCOSE, CAPILLARY: Glucose-Capillary: 99 mg/dL (ref 70–99)

## 2023-06-12 MED ORDER — TECHNETIUM TC 99M TETROFOSMIN IV KIT
8.9000 | PACK | Freq: Once | INTRAVENOUS | Status: AC | PRN
  Administered 2023-06-12: 9.22 via INTRAVENOUS

## 2023-06-12 NOTE — Telephone Encounter (Signed)
Patient called to report right ear continued to be painful after starting antibiotic amoxicillin 06/02/23 prescribed by Dr. Pamelia Hoit and then left ear began hurting. She states she went to ED on 06/06/23 for increased ear pain and new left ear pain. Was told she had perforated ear drum and given Augmentin rx. Contacted Dr. Avel Sensor office as pain continued and was given prescription for Ciproflox-Dexameth ear drops on 06/07/23.  She states she contacted Dr. Avel Sensor office today as she has been taking Augmentin and using ear drops as directed for 5 days and ears continue very painful. She states they advised she complete medications and see Dr. Suszanne Conners in 2 weeks.  Ms. Spillman asked that Dr. Pamelia Hoit be informed of above information and if he could advise any additional course of action.  Message routed to Dr. Pamelia Hoit

## 2023-06-14 ENCOUNTER — Telehealth: Payer: Self-pay | Admitting: Hematology and Oncology

## 2023-06-14 NOTE — Telephone Encounter (Signed)
C/O fullness in ear. Seen ENT (completed Augmentin) Recommend: Sudafed and Claritin We will review the PET CT scan when it gets finalized and decide if any additional scanning is required. I will talk to her once I have the PET scan report.  I reviewed the PET scan myself and did not see any inflammation in the sinuses.

## 2023-06-16 DIAGNOSIS — R7303 Prediabetes: Secondary | ICD-10-CM | POA: Diagnosis not present

## 2023-06-18 NOTE — Progress Notes (Signed)
Patient Care Team: [name], MD as PCP - General (Family Medicine) Donnelly Angelica, RN as Oncology Nurse Navigator Pershing Proud, RN as Oncology Nurse Navigator Serena Croissant, MD as Consulting Physician (Hematology and Oncology) Default, Provider, MD as Technician  DIAGNOSIS:  Encounter Diagnosis  Name Primary?   Carcinoma of breast metastatic to liver, unspecified laterality (HCC) Yes    SUMMARY OF ONCOLOGIC HISTORY: Oncology History  Breast cancer metastasized to liver (HCC)  01/25/2019 Initial Diagnosis   Screening mammogram showed bilateral calcifications right breast 5 mm, pleomorphic.  Left breast 1.6 cm punctate, smaller group of calcifications spanning 3 mm.  Right lower outer quadrant biopsy revealed low-grade DCIS ER 90%, PR 70%; biopsy of left breast calcifications ALH and PASH   01/31/2019 Genetic Testing   ATM c.8495G>A and RECQL4 c.2587G>A VUS identified on the 9-gene STAT panel and Multi-cancer panel through [facility].  The STAT Breast cancer panel offered by [facility] includes sequencing and rearrangement analysis for the following 9 genes:  ATM, BRCA1, BRCA2, CDH1, CHEK2, PALB2, PTEN, STK11 and TP53.   The Multi-Gene Panel offered by [facility] includes sequencing and/or deletion duplication testing of the following 84 genes: AIP, ALK, APC, ATM, AXIN2,BAP1,  BARD1, BLM, BMPR1A, BRCA1, BRCA2, BRIP1, CASR, CDC73, CDH1, CDK4, CDKN1B, CDKN1C, CDKN2A (p14ARF), CDKN2A (p16INK4a), CEBPA, CHEK2, CTNNA1, DICER1, DIS3L2, EGFR (c.2369C>T, p.Thr790Met variant only), EPCAM (Deletion/duplication testing only), FH, FLCN, GATA2, GPC3, GREM1 (Promoter region deletion/duplication testing only), HOXB13 (c.251G>A, p.Gly84Glu), HRAS, KIT, MAX, MEN1, MET, MITF (c.952G>A, p.Glu318Lys variant only), MLH1, MSH2, MSH3, MSH6, MUTYH, NBN, NF1, NF2, NTHL1, PALB2, PDGFRA, PHOX2B, PMS2, POLD1, POLE, POT1, PRKAR1A, PTCH1, PTEN, RAD50, RAD51C, RAD51D, RB1, RECQL4, RET, RUNX1, SDHAF2, SDHA (sequence changes  only), SDHB, SDHC, SDHD, SMAD4, SMARCA4, SMARCB1, SMARCE1, STK11, SUFU, TERC, TERT, TMEM127, TP53, TSC1, TSC2, VHL, WRN and WT1.  he report date is February 02, 2019.   02/11/2019 Surgery   RT lumpectomy: DICS Intermediate grade, Margins Neg, ER 90%, PR 70%; Lt Lumpectomy: UDH   02/26/2019 -  Anti-estrogen oral therapy   Tamoxifen 20mg  daily, stopped because of depression and facial numbness   05/20/2019 - 06/17/2019 Radiation Therapy   Adjuvant XRT   08/15/2021 Relapse/Recurrence   MRI surveillance detected Right breast calcifications 5 cm, Ant biopsy: IG DCIS with necrosis, ER 40%, PR 0%, Posterior biopsy: Intermediate grade DCIS with [facility]   08/19/2022 Cancer Staging   Staging form: Breast, AJCC 8th Edition - Clinical: Stage IV (cT14mi, cNX, cM1, G3, ER: Unknown, PR: Unknown, HER2: Unknown) - Signed by Serena Croissant, MD on 02/06/2023 Histologic grading system: 3 grade system   02/14/2023 -  Chemotherapy   Patient is on Treatment Plan : BREAST DOCEtaxel + Trastuzumab + Pertuzumab (THP) q21d x 8 cycles / Trastuzumab + Pertuzumab q21d x 4 cycles     AML (acute myeloid leukemia) (HCC)  09/1998 Initial Diagnosis   AML (acute myeloid leukemia) (HCC) treated with 2 induction regimens followed by 6 consolidation regimens, remission     CHIEF COMPLIANT: Follow-up scans   INTERVAL HISTORY: Joann Meadows is a 47 y.o. with above-mentioned history of right breast DCIS for which she underwent a lumpectomy. She presents to the clinic today for a follow-up. Pt here today to review scans. She reports no new issues or concerns to the clinic today.   ALLERGIES:  is allergic to levaquin [levofloxacin], tamoxifen citrate, and tape.  MEDICATIONS:  Current Outpatient Medications  Medication Sig Dispense Refill   ALPRAZolam (XANAX) 0.25 MG tablet 1 tablet Orally once a  day if needed for anxiety/panic (not a daily medication, avoid regular use) for 30 days     amoxicillin (AMOXIL) 500 MG tablet Take 1 tablet  (500 mg total) by mouth 2 (two) times daily. 14 tablet 0   amoxicillin-clavulanate (AUGMENTIN) 875-125 MG tablet Take 1 tablet by mouth every 12 (twelve) hours. 14 tablet 0   azithromycin (ZITHROMAX Z-PAK) 250 MG tablet Take as directed 6 each 0   cholecalciferol (VITAMIN D3) 25 MCG (1000 UNIT) tablet Take 2,000 Units by mouth daily.     ciprofloxacin-dexamethasone (CIPRODEX) OTIC suspension SMARTSIG:4 Drop(s) Right Ear Twice Daily     DEXAMETHASONE PO Take by mouth. Taking day prior to treatment and day after treatment     hydrochlorothiazide (HYDRODIURIL) 25 MG tablet TAKE 1 TABLET BY MOUTH EVERY DAY IN THE MORNING FOR 30 DAYS ONCE A DAY     KLOR-CON M20 20 MEQ tablet TAKE 1 TABLET BY MOUTH EVERY DAY 90 tablet 1   magic mouthwash (lidocaine, diphenhydrAMINE, alum & mag hydroxide) suspension Swish and swallow 5 mLs 4 (four) times daily as needed for mouth pain. 360 mL 0   magic mouthwash w/lidocaine SOLN Take 5 mLs by mouth 4 (four) times daily as needed for mouth pain. 240 mL 2   olmesartan (BENICAR) 5 MG tablet Take by mouth.     ondansetron (ZOFRAN) 8 MG tablet Take 1 tablet (8 mg total) by mouth every 8 (eight) hours as needed for nausea or vomiting. 30 tablet 1   oxyCODONE (ROXICODONE) 5 MG immediate release tablet Take 1 tablet (5 mg total) by mouth every 4 (four) hours as needed for severe pain. 15 tablet 0   sucralfate (CARAFATE) 1 g tablet Dissolve 1 tablet in 8 oz of water then drink every 8 hours 12 tablet 0   No current facility-administered medications for this visit.    PHYSICAL EXAMINATION: ECOG PERFORMANCE STATUS: 1 - Symptomatic but completely ambulatory  Vitals:   06/20/23 0943  BP: (!) 167/97  Pulse: 86  Resp: 18  Temp: 97.9 F (36.6 C)  SpO2: 100%   Filed Weights   06/20/23 0943  Weight: 175 lb (79.4 kg)      LABORATORY DATA:  I have reviewed the data as listed    Latest Ref Rng & Units 05/28/2023    9:53 AM 05/09/2023   10:19 AM 04/17/2023    9:04 AM  CMP   Glucose 70 - 99 mg/dL 92  098  119   BUN 6 - 20 mg/dL 15  24  11    Creatinine 0.44 - 1.00 mg/dL 1.47  8.29  5.62   Sodium 135 - 145 mmol/L 140  141  141   Potassium 3.5 - 5.1 mmol/L 4.7  3.4  4.4   Chloride 98 - 111 mmol/L 107  108  107   CO2 22 - 32 mmol/L 29  26  28    Calcium 8.9 - 10.3 mg/dL 9.4  9.2  9.2   Total Protein 6.5 - 8.1 g/dL 6.4  6.3  7.0   Total Bilirubin 0.3 - 1.2 mg/dL 0.3  0.4  0.4   Alkaline Phos 38 - 126 U/L 68  62  56   AST 15 - 41 U/L 16  16  11    ALT 0 - 44 U/L 19  23  11      Lab Results  Component Value Date   WBC 12.5 (H) 06/20/2023   HGB 11.4 (L) 06/20/2023   HCT 34.7 (L) 06/20/2023  MCV 84.6 06/20/2023   PLT 281 06/20/2023   NEUTROABS 8.5 (H) 06/20/2023    ASSESSMENT & PLAN:  Breast cancer metastasized to liver (HCC) 02/11/19: RT lumpectomy: DICS Intermediate grade, Margins Neg, ER 90%, PR 70%; Lt Lumpectomy: UDH status post radiation, could not tolerate tamoxifen recurrence: 08/15/2021:MRI surveillance detected Right breast calcifications 5 cm, Ant biopsy: IG DCIS with necrosis, ER 40%, PR 0%, Posterior biopsy: Intermediate grade DCIS with [facility]  11/29/2021: Right mastectomy: High-grade DCIS with microinvasion, ER 0%, PR 0%, HER2 positive, 0/1 lymph node negative  T1c MIC, N0: Stage Ia   Patient had second opinion at [facility] with Dr. Creig Hines who agreed with her plan of no systemic chemotherapy   Breast cancer surveillance: 1.  Mammogram 08/22/2022: Left breast benign breast density category B  2.  CT CAP 12/09/2022: Multiple liver lesions indicative of cysts and hemangiomas new right hepatic lobe lesion favored to be hemangioma otherwise no evidence of metastatic disease.  Radiology recommended an MRI of the liver. 3.  Liver MRI 01/08/2023: 2 New rim-enhancing lesions 2.3 cm (was 1.6 cm on 12/09/2022), 1.8 cm (measured 1.5 cm), benign hemangioma 2.4 cm and 1.9 cm and 1.3 cm unchanged 4.  PET CT scan 01/22/2023: Liver: 2.5 cm and 2.4 cm  lesions ------------------------------------------------------------------------------------------------------------------------------------------------- 01/31/2023: Liver lesion biopsy: Metastatic adenocarcinoma to the liver consistent with breast primary positive for CK7 and GATA3, ER 0%, PR 0%, Ki-67 25%, HER2 3+ positive, [facility] molecular testing is pending   Current treatment: Completed 6 cycles of Taxotere Herceptin Perjeta, Today is maintenance Herceptin and Perjeta Chemo toxicities: Muscle cramps: Due to hypokalemia: Replaced, no further issues Mouth sores: Resolved Taste changes Alopecia Ear infection: Treated with 2 rounds of antibiotics somewhat better.     She saw [facility] Roland Earl at [facility] for second opinion who agreed with the treatment plan.   Meningioma: Follows with Dr. Barbaraann Cao  CT CAP 04/17/2023: Slight progression of metastatic lesions in the liver 1.6 cm (was 2.3 cm), 1.4 cm (was previously 1.8 cm), previously noted small pulmonary nodules 4 mm or less felt to be benign PET/CT: 06/19/2023: Interval resolution of the hypermetabolic lesions in the liver.  Recommendation: Herceptin Perjeta maintenance therapy every 3 weeks Our plan is to obtain PET CT scan in 3 months again.   Orders Placed This Encounter  Procedures   NM PET Image Restag (PS) Skull Base To Thigh    Standing Status:   Future    Standing Expiration Date:   06/19/2024    Order Specific Question:   If indicated for the ordered procedure, I authorize the administration of a radiopharmaceutical per Radiology protocol    Answer:   Yes    Order Specific Question:   Is the patient pregnant?    Answer:   No    Order Specific Question:   Preferred imaging location?    Answer:   [facility]   The patient has a good understanding of the overall plan. she agrees with it. she will call with any problems that may develop before the next visit here. Total time spent: 30 mins including face to face time and time spent  for planning, charting and co-ordination of care   Tamsen Meek, MD 06/20/23    .dsi

## 2023-06-20 ENCOUNTER — Inpatient Hospital Stay: Payer: BC Managed Care – PPO | Admitting: Hematology and Oncology

## 2023-06-20 ENCOUNTER — Other Ambulatory Visit: Payer: Self-pay

## 2023-06-20 ENCOUNTER — Inpatient Hospital Stay: Payer: BC Managed Care – PPO

## 2023-06-20 VITALS — BP 167/97 | HR 86 | Temp 97.9°F | Resp 18 | Ht 65.0 in | Wt 175.0 lb

## 2023-06-20 VITALS — BP 148/80 | HR 79 | Resp 16

## 2023-06-20 DIAGNOSIS — Z79899 Other long term (current) drug therapy: Secondary | ICD-10-CM | POA: Diagnosis not present

## 2023-06-20 DIAGNOSIS — E876 Hypokalemia: Secondary | ICD-10-CM | POA: Diagnosis not present

## 2023-06-20 DIAGNOSIS — C787 Secondary malignant neoplasm of liver and intrahepatic bile duct: Secondary | ICD-10-CM

## 2023-06-20 DIAGNOSIS — K769 Liver disease, unspecified: Secondary | ICD-10-CM

## 2023-06-20 DIAGNOSIS — Z95828 Presence of other vascular implants and grafts: Secondary | ICD-10-CM

## 2023-06-20 DIAGNOSIS — C50919 Malignant neoplasm of unspecified site of unspecified female breast: Secondary | ICD-10-CM

## 2023-06-20 DIAGNOSIS — D0511 Intraductal carcinoma in situ of right breast: Secondary | ICD-10-CM | POA: Diagnosis not present

## 2023-06-20 DIAGNOSIS — Z5112 Encounter for antineoplastic immunotherapy: Secondary | ICD-10-CM | POA: Diagnosis not present

## 2023-06-20 DIAGNOSIS — Z923 Personal history of irradiation: Secondary | ICD-10-CM | POA: Diagnosis not present

## 2023-06-20 DIAGNOSIS — Z9011 Acquired absence of right breast and nipple: Secondary | ICD-10-CM | POA: Diagnosis not present

## 2023-06-20 DIAGNOSIS — Z170 Estrogen receptor positive status [ER+]: Secondary | ICD-10-CM | POA: Diagnosis not present

## 2023-06-20 LAB — CBC WITH DIFFERENTIAL (CANCER CENTER ONLY)
Abs Immature Granulocytes: 0.08 10*3/uL — ABNORMAL HIGH (ref 0.00–0.07)
Basophils Absolute: 0.1 10*3/uL (ref 0.0–0.1)
Basophils Relative: 1 %
Eosinophils Absolute: 0.1 10*3/uL (ref 0.0–0.5)
Eosinophils Relative: 1 %
HCT: 34.7 % — ABNORMAL LOW (ref 36.0–46.0)
Hemoglobin: 11.4 g/dL — ABNORMAL LOW (ref 12.0–15.0)
Immature Granulocytes: 1 %
Lymphocytes Relative: 21 %
Lymphs Abs: 2.6 10*3/uL (ref 0.7–4.0)
MCH: 27.8 pg (ref 26.0–34.0)
MCHC: 32.9 g/dL (ref 30.0–36.0)
MCV: 84.6 fL (ref 80.0–100.0)
Monocytes Absolute: 1.1 10*3/uL — ABNORMAL HIGH (ref 0.1–1.0)
Monocytes Relative: 9 %
Neutro Abs: 8.5 10*3/uL — ABNORMAL HIGH (ref 1.7–7.7)
Neutrophils Relative %: 67 %
Platelet Count: 281 10*3/uL (ref 150–400)
RBC: 4.1 MIL/uL (ref 3.87–5.11)
RDW: 16.8 % — ABNORMAL HIGH (ref 11.5–15.5)
WBC Count: 12.5 10*3/uL — ABNORMAL HIGH (ref 4.0–10.5)
nRBC: 0 % (ref 0.0–0.2)

## 2023-06-20 LAB — CMP (CANCER CENTER ONLY)
ALT: 12 U/L (ref 0–44)
AST: 12 U/L — ABNORMAL LOW (ref 15–41)
Albumin: 4.1 g/dL (ref 3.5–5.0)
Alkaline Phosphatase: 81 U/L (ref 38–126)
Anion gap: 8 (ref 5–15)
BUN: 31 mg/dL — ABNORMAL HIGH (ref 6–20)
CO2: 23 mmol/L (ref 22–32)
Calcium: 9.4 mg/dL (ref 8.9–10.3)
Chloride: 108 mmol/L (ref 98–111)
Creatinine: 0.63 mg/dL (ref 0.44–1.00)
GFR, Estimated: >60 mL/min (ref >60)
Glucose, Bld: 94 mg/dL (ref 70–99)
Potassium: 3.4 mmol/L — ABNORMAL LOW (ref 3.5–5.1)
Sodium: 139 mmol/L (ref 135–145)
Total Bilirubin: 0.2 mg/dL — ABNORMAL LOW (ref 0.3–1.2)
Total Protein: 6.5 g/dL (ref 6.5–8.1)

## 2023-06-20 MED ORDER — TRASTUZUMAB-ANNS CHEMO 150 MG IV SOLR
6.0000 mg/kg | Freq: Once | INTRAVENOUS | Status: AC
  Administered 2023-06-20: 420 mg via INTRAVENOUS
  Filled 2023-06-20: qty 20

## 2023-06-20 MED ORDER — SODIUM CHLORIDE 0.9% FLUSH
10.0000 mL | Freq: Once | INTRAVENOUS | Status: AC
  Administered 2023-06-20: 10 mL

## 2023-06-20 MED ORDER — SODIUM CHLORIDE 0.9 % IV SOLN
420.0000 mg | Freq: Once | INTRAVENOUS | Status: AC
  Administered 2023-06-20: 420 mg via INTRAVENOUS
  Filled 2023-06-20: qty 14

## 2023-06-20 MED ORDER — DIPHENHYDRAMINE HCL 25 MG PO CAPS
50.0000 mg | ORAL_CAPSULE | Freq: Once | ORAL | Status: AC
  Administered 2023-06-20: 50 mg via ORAL
  Filled 2023-06-20: qty 2

## 2023-06-20 MED ORDER — SODIUM CHLORIDE 0.9% FLUSH
10.0000 mL | INTRAVENOUS | Status: DC | PRN
  Administered 2023-06-20: 10 mL

## 2023-06-20 MED ORDER — SODIUM CHLORIDE 0.9 % IV SOLN: Freq: Once | INTRAVENOUS | Status: AC

## 2023-06-20 MED ORDER — ACETAMINOPHEN 325 MG PO TABS
650.0000 mg | ORAL_TABLET | Freq: Once | ORAL | Status: AC
  Administered 2023-06-20: 650 mg via ORAL
  Filled 2023-06-20: qty 2

## 2023-06-20 MED ORDER — HEPARIN SOD (PORK) LOCK FLUSH 100 UNIT/ML IV SOLN
500.0000 [IU] | Freq: Once | INTRAVENOUS | Status: AC | PRN
  Administered 2023-06-20: 500 [IU]

## 2023-06-20 NOTE — Patient Instructions (Signed)
[employer], Adult A central line is a long, thin tube (catheter) that is put into a vein so that it goes to a large vein above your heart. It can be used to: Give you medicine or fluids. Give you food and nutrients. Take blood or give you blood for testing or treatments. Types of central lines There are four main types of central lines: Peripherally inserted central catheter (PICC) line. This type is usually put in the upper arm and goes up the arm to the heart. Tunneled central line. This type is placed in a large vein in the neck, chest, or groin. It is tunneled under the skin and brought out through a second incision. Non-tunneled central line. This type is used for a shorter time than other types, usually for 7 days at the most. It is inserted in the neck, chest, or groin. Implanted port. This type can stay in place longer than other types of central lines. It is normally put in the upper chest but can also be placed in the upper arm or the belly. Surgery is needed to put it in and take it out. The type of central line you get will depend on how long you need it and your medical condition. Tell a doctor about: Any allergies you have. All medicines you are taking. These include vitamins, herbs, eye drops, creams, and over-the-counter medicines. Any problems you or family members have had with anesthetic medicines. Any blood disorders you have. Any surgeries you have had. Any medical conditions you have. Whether you are pregnant or may be pregnant. What are the risks? Generally, central lines are safe. However, problems may occur, including: Infection. A blood clot. Bleeding from the place where the central line was inserted. Getting a hole or crack in the central line. If this happens, the central line will need to be replaced. Central line failure. The catheter moving or coming out of place. What happens before the procedure? Medicines Ask your doctor about changing or  stopping: Your normal medicines. Vitamins, herbs, and supplements. Over-the-counter medicines. Do not take aspirin or ibuprofen unless you are told to. General instructions Follow instructions from your doctor about eating or drinking. For your safety, your doctor may: Loraine Leriche the area of the procedure. Remove hair at the procedure site. Ask you to wash with a soap that kills germs. Plan to have a responsible adult take you home from the hospital or clinic. If you will be going home right after the procedure, plan to have a responsible adult care for you for the time you are told. This is important. What happens during the procedure? An IV tube will be put into one of your veins. You may be given: A sedative. This medicine helps you relax. Anesthetics. These medicines numb certain areas of your body. Your skin will be cleaned with a germ-killing (antiseptic) solution. You may be covered with clean drapes. Your blood pressure, heart rate, breathing rate, and blood oxygen level will be monitored during the procedure. The central line will be put into the vein and moved through it to the correct spot. The doctor may use X-ray equipment to help guide the central line to the right place. A bandage (dressing) will be placed over the insertion area. The procedure may vary among doctors and hospitals. What can I expect after the procedure? You will be monitored until you leave the hospital or clinic. This includes checking your blood pressure, heart rate, breathing rate, and blood oxygen level. Caps may  be placed on the ends of the central line tubing. If you were given a sedative during your procedure, do not drive or use machines until your doctor says that it is safe. Follow these instructions at home: Caring for the tube  Follow instructions from your doctor about: Flushing the tube. Cleaning the tube and the area around it. Only use germ-free (sterile) supplies to flush. The supplies  should be from your doctor, a pharmacy, or another place that your doctor recommends. Before you flush the tube or clean the area around the tube: Wash your hands with soap and water for at least 20 seconds. If you cannot use soap and water, use hand sanitizer. Clean the central line hub with rubbing alcohol. To do this: Scrub it using a twisting motion and rub for 10 to 15 seconds or for 30 twists. Follow the manufacturer's instructions. Be sure you scrub the top of the hub, not just the sides. Never reuse alcohol pads. Let the hub dry before use. Keep it from touching anything while drying. Caring for your skin Check the skin around the central line every day for signs of infection. Check for: Redness, swelling, or pain. Fluid or blood. Warmth. Pus or a bad smell. Keep the area where the tube was put in clean and dry. Change bandages only as told by your doctor. Keep your bandage dry. If a bandage gets wet, have it changed right away. General instructions Keep the tube clamped, unless it is being used. If you or someone else accidentally pulls on the tube, make sure: The bandage is okay. There is no bleeding. The tube has not been pulled out. Do not use scissors or sharp objects near the tube. Do not take baths, swim, or use a hot tub until your doctor says it is okay. Ask your doctor if you may take showers. You may only be allowed to take sponge baths. Ask your doctor what activities are safe for you. Your doctor may tell you not to lift anything or move your arm too much. Take over-the-counter and prescription medicines only as told by your doctor. Keep all follow-up visits. Storing and throwing away supplies Keep your supplies in a clean, dry location. Throw away any used syringes in a container that is only for sharp items (sharps container). You can buy a sharps container from a pharmacy, or you can make one by using an empty hard plastic bottle with a cover. Place any used  bandages or infusion bags into a plastic bag. Throw that bag in the trash. Contact a doctor if: You have any of these signs of infection where the tube was put in: Redness, swelling, or pain. Fluid or blood. Warmth. Pus or a bad smell. Get help right away if: You have: A fever or chills. Shortness of breath. Pain in your chest. A fast heartbeat. Swelling in your neck, face, chest, or arm. You feel dizzy or you faint. There are red lines coming from where the tube was put in. The area where the tube was put in is bleeding and the bleeding will not stop. Your tube is hard to flush. You do not get a blood return from the tube. The tube gets loose or comes out. The tube has a hole or a tear. The tube leaks. Summary A central line is a long, thin tube (catheter) that is put in your vein. It can be used to give you medicine, food, or fluids. Follow instructions from your doctor about flushing  and cleaning the tube. Keep the area where the tube was put in clean and dry. Ask your doctor what activities are safe for you. This information is not intended to replace advice given to you by your health care provider. Make sure you discuss any questions you have with your health care provider. Document Revised: 07/12/2020 Document Reviewed: 07/13/2020 Elsevier Patient Education  2024 [employer].

## 2023-06-20 NOTE — Assessment & Plan Note (Signed)
02/11/19: RT lumpectomy: DICS Intermediate grade, Margins Neg, ER 90%, PR 70%; Lt Lumpectomy: UDH status post radiation, could not tolerate tamoxifen recurrence: 08/15/2021:MRI surveillance detected Right breast calcifications 5 cm, Ant biopsy: IG DCIS with necrosis, ER 40%, PR 0%, Posterior biopsy: Intermediate grade DCIS with [facility]  11/29/2021: Right mastectomy: High-grade DCIS with microinvasion, ER 0%, PR 0%, HER2 positive, 0/1 lymph node negative  T1c MIC, N0: Stage Ia   Patient had second opinion at [facility] with Dr. Creig Hines who agreed with her plan of no systemic chemotherapy   Breast cancer surveillance: 1.  Mammogram 08/22/2022: Left breast benign breast density category B  2.  CT CAP 12/09/2022: Multiple liver lesions indicative of cysts and hemangiomas new right hepatic lobe lesion favored to be hemangioma otherwise no evidence of metastatic disease.  Radiology recommended an MRI of the liver. 3.  Liver MRI 01/08/2023: 2 New rim-enhancing lesions 2.3 cm (was 1.6 cm on 12/09/2022), 1.8 cm (measured 1.5 cm), benign hemangioma 2.4 cm and 1.9 cm and 1.3 cm unchanged 4.  PET CT scan 01/22/2023: Liver: 2.5 cm and 2.4 cm lesions ------------------------------------------------------------------------------------------------------------------------------------------------- 01/31/2023: Liver lesion biopsy: Metastatic adenocarcinoma to the liver consistent with breast primary positive for CK7 and GATA3, ER 0%, PR 0%, Ki-67 25%, HER2 3+ positive, Caris molecular testing is pending   Current treatment: Taxotere Herceptin Perjeta cycle 6 on 05/30/2023 Chemo toxicities: Muscle cramps: Due to hypokalemia: Replaced, no further issues Mouth sores: Resolved Taste changes Alopecia Sore throat: Probably due to mucositis.  She has Magic mouthwash.  Previously treated with 2 rounds of antibiotics.   She did very well with the reduced dosage of Taxotere.   She saw Dr. Roland Earl at [facility] for second opinion who  agreed with the treatment plan.   Meningioma: Follows with Dr. Barbaraann Cao  CT CAP 04/17/2023: Slight progression of metastatic lesions in the liver 1.6 cm (was 2.3 cm), 1.4 cm (was previously 1.8 cm), previously noted small pulmonary nodules 4 mm or less felt to be benign PET/CT: 06/19/2023: Interval resolution of the hypermetabolic lesions in the liver.  Recommendation: Herceptin Perjeta maintenance therapy every 3 weeks

## 2023-06-20 NOTE — Patient Instructions (Signed)
[place] [facility]  Discharge Instructions: Thank you for choosing [facility] to provide your oncology and hematology care.   If you have a lab appointment with the Cancer Center, please go directly to the Cancer Center and check in at the registration area.   Wear comfortable clothing and clothing appropriate for easy access to any Portacath or PICC line.   We strive to give you quality time with your provider. You may need to reschedule your appointment if you arrive late (15 or more minutes).  Arriving late affects you and other patients whose appointments are after yours.  Also, if you miss three or more appointments without notifying the office, you may be dismissed from the clinic at the provider's discretion.      For prescription refill requests, have your pharmacy contact our office and allow 72 hours for refills to be completed.    Today you received the following chemotherapy and/or immunotherapy agents: trastuzumab, pertuzumab   To help prevent nausea and vomiting after your treatment, we encourage you to take your nausea medication as directed.  BELOW ARE SYMPTOMS THAT SHOULD BE REPORTED IMMEDIATELY: *FEVER GREATER THAN 100.4 F (38 C) OR HIGHER *CHILLS OR SWEATING *NAUSEA AND VOMITING THAT IS NOT CONTROLLED WITH YOUR NAUSEA MEDICATION *UNUSUAL SHORTNESS OF BREATH *UNUSUAL BRUISING OR BLEEDING *URINARY PROBLEMS (pain or burning when urinating, or frequent urination) *BOWEL PROBLEMS (unusual diarrhea, constipation, pain near the anus) TENDERNESS IN MOUTH AND THROAT WITH OR WITHOUT PRESENCE OF ULCERS (sore throat, sores in mouth, or a toothache) UNUSUAL RASH, SWELLING OR PAIN  UNUSUAL VAGINAL DISCHARGE OR ITCHING   Items with * indicate a potential emergency and should be followed up as soon as possible or go to the Emergency Department if any problems should occur.  Please show the CHEMOTHERAPY ALERT CARD or IMMUNOTHERAPY ALERT  CARD at check-in to the Emergency Department and triage nurse.  Should you have questions after your visit or need to cancel or reschedule your appointment, please contact [place] [facility]  Dept: [phone]  and follow the prompts.  Office hours are 8:00 a.m. to 4:30 p.m. Monday - Friday. Please note that voicemails left after 4:00 p.m. may not be returned until the following business day.  We are closed weekends and major holidays. You have access to a nurse at all times for urgent questions. Please call the main number to the clinic Dept: [phone] and follow the prompts.   For any non-urgent questions, you may also contact your provider using MyChart. We now offer e-Visits for anyone 42 and older to request care online for non-urgent symptoms. For details visit [email or web address].   Also download the MyChart app! Go to the app store, search "MyChart", open the app, select [facility] [facility], and log in with your MyChart username and password.

## 2023-06-26 DIAGNOSIS — H66011 Acute suppurative otitis media with spontaneous rupture of ear drum, right ear: Secondary | ICD-10-CM | POA: Diagnosis not present

## 2023-06-26 DIAGNOSIS — H9011 Conductive hearing loss, unilateral, right ear, with unrestricted hearing on the contralateral side: Secondary | ICD-10-CM | POA: Diagnosis not present

## 2023-07-11 ENCOUNTER — Other Ambulatory Visit: Payer: Self-pay

## 2023-07-11 ENCOUNTER — Inpatient Hospital Stay: Payer: BC Managed Care – PPO | Attending: Hematology and Oncology | Admitting: Hematology and Oncology

## 2023-07-11 ENCOUNTER — Inpatient Hospital Stay: Payer: BC Managed Care – PPO

## 2023-07-11 ENCOUNTER — Inpatient Hospital Stay: Payer: BC Managed Care – PPO | Attending: Hematology and Oncology

## 2023-07-11 VITALS — BP 156/96 | HR 81 | Temp 97.3°F | Resp 18 | Ht 65.0 in | Wt 175.2 lb

## 2023-07-11 DIAGNOSIS — D0511 Intraductal carcinoma in situ of right breast: Secondary | ICD-10-CM | POA: Insufficient documentation

## 2023-07-11 DIAGNOSIS — C787 Secondary malignant neoplasm of liver and intrahepatic bile duct: Secondary | ICD-10-CM

## 2023-07-11 DIAGNOSIS — E876 Hypokalemia: Secondary | ICD-10-CM | POA: Diagnosis not present

## 2023-07-11 DIAGNOSIS — Z170 Estrogen receptor positive status [ER+]: Secondary | ICD-10-CM | POA: Diagnosis not present

## 2023-07-11 DIAGNOSIS — Z79899 Other long term (current) drug therapy: Secondary | ICD-10-CM | POA: Insufficient documentation

## 2023-07-11 DIAGNOSIS — Z923 Personal history of irradiation: Secondary | ICD-10-CM | POA: Diagnosis not present

## 2023-07-11 DIAGNOSIS — K769 Liver disease, unspecified: Secondary | ICD-10-CM

## 2023-07-11 DIAGNOSIS — Z5112 Encounter for antineoplastic immunotherapy: Secondary | ICD-10-CM | POA: Insufficient documentation

## 2023-07-11 DIAGNOSIS — C50919 Malignant neoplasm of unspecified site of unspecified female breast: Secondary | ICD-10-CM

## 2023-07-11 DIAGNOSIS — Z95828 Presence of other vascular implants and grafts: Secondary | ICD-10-CM

## 2023-07-11 LAB — CMP (CANCER CENTER ONLY)
ALT: 19 U/L (ref 0–44)
AST: 15 U/L (ref 15–41)
Albumin: 4.1 g/dL (ref 3.5–5.0)
Alkaline Phosphatase: 76 U/L (ref 38–126)
Anion gap: 5 (ref 5–15)
BUN: 23 mg/dL — ABNORMAL HIGH (ref 6–20)
CO2: 27 mmol/L (ref 22–32)
Calcium: 8.7 mg/dL — ABNORMAL LOW (ref 8.9–10.3)
Chloride: 109 mmol/L (ref 98–111)
Creatinine: 0.82 mg/dL (ref 0.44–1.00)
GFR, Estimated: >60 mL/min (ref >60)
Glucose, Bld: 104 mg/dL — ABNORMAL HIGH (ref 70–99)
Potassium: 4 mmol/L (ref 3.5–5.1)
Sodium: 141 mmol/L (ref 135–145)
Total Bilirubin: 0.3 mg/dL (ref 0.3–1.2)
Total Protein: 6.9 g/dL (ref 6.5–8.1)

## 2023-07-11 LAB — CBC WITH DIFFERENTIAL (CANCER CENTER ONLY)
Abs Immature Granulocytes: 0.01 10*3/uL (ref 0.00–0.07)
Basophils Absolute: 0.1 10*3/uL (ref 0.0–0.1)
Basophils Relative: 1 %
Eosinophils Absolute: 0.3 10*3/uL (ref 0.0–0.5)
Eosinophils Relative: 5 %
HCT: 37.3 % (ref 36.0–46.0)
Hemoglobin: 12.3 g/dL (ref 12.0–15.0)
Immature Granulocytes: 0 %
Lymphocytes Relative: 38 %
Lymphs Abs: 2 10*3/uL (ref 0.7–4.0)
MCH: 28.1 pg (ref 26.0–34.0)
MCHC: 33 g/dL (ref 30.0–36.0)
MCV: 85.4 fL (ref 80.0–100.0)
Monocytes Absolute: 0.5 10*3/uL (ref 0.1–1.0)
Monocytes Relative: 10 %
Neutro Abs: 2.4 10*3/uL (ref 1.7–7.7)
Neutrophils Relative %: 46 %
Platelet Count: 243 10*3/uL (ref 150–400)
RBC: 4.37 MIL/uL (ref 3.87–5.11)
RDW: 16.8 % — ABNORMAL HIGH (ref 11.5–15.5)
WBC Count: 5.2 10*3/uL (ref 4.0–10.5)
nRBC: 0 % (ref 0.0–0.2)

## 2023-07-11 LAB — PREGNANCY, URINE: Preg Test, Ur: NEGATIVE

## 2023-07-11 MED ORDER — SODIUM CHLORIDE 0.9 % IV SOLN: Freq: Once | INTRAVENOUS | Status: AC

## 2023-07-11 MED ORDER — SODIUM CHLORIDE 0.9% FLUSH
10.0000 mL | INTRAVENOUS | Status: DC | PRN
  Administered 2023-07-11: 10 mL

## 2023-07-11 MED ORDER — SODIUM CHLORIDE 0.9% FLUSH
10.0000 mL | Freq: Once | INTRAVENOUS | Status: AC
  Administered 2023-07-11: 10 mL

## 2023-07-11 MED ORDER — ACETAMINOPHEN 325 MG PO TABS
650.0000 mg | ORAL_TABLET | Freq: Once | ORAL | Status: AC
  Administered 2023-07-11: 650 mg via ORAL
  Filled 2023-07-11: qty 2

## 2023-07-11 MED ORDER — DIPHENHYDRAMINE HCL 25 MG PO CAPS
50.0000 mg | ORAL_CAPSULE | Freq: Once | ORAL | Status: AC
  Administered 2023-07-11: 50 mg via ORAL
  Filled 2023-07-11: qty 2

## 2023-07-11 MED ORDER — SODIUM CHLORIDE 0.9 % IV SOLN
420.0000 mg | Freq: Once | INTRAVENOUS | Status: AC
  Administered 2023-07-11: 420 mg via INTRAVENOUS
  Filled 2023-07-11: qty 14

## 2023-07-11 MED ORDER — SODIUM CHLORIDE 0.9 % IV SOLN: Freq: Once | INTRAVENOUS | Status: DC

## 2023-07-11 MED ORDER — TRASTUZUMAB-ANNS CHEMO 150 MG IV SOLR
6.0000 mg/kg | Freq: Once | INTRAVENOUS | Status: AC
  Administered 2023-07-11: 420 mg via INTRAVENOUS
  Filled 2023-07-11: qty 20

## 2023-07-11 MED ORDER — HEPARIN SOD (PORK) LOCK FLUSH 100 UNIT/ML IV SOLN
500.0000 [IU] | Freq: Once | INTRAVENOUS | Status: AC | PRN
  Administered 2023-07-11: 500 [IU]

## 2023-07-11 NOTE — Patient Instructions (Signed)
[place] [facility]  Discharge Instructions: Thank you for choosing [facility] to provide your oncology and hematology care.   If you have a lab appointment with the Cancer Center, please go directly to the Cancer Center and check in at the registration area.   Wear comfortable clothing and clothing appropriate for easy access to any Portacath or PICC line.   We strive to give you quality time with your provider. You may need to reschedule your appointment if you arrive late (15 or more minutes).  Arriving late affects you and other patients whose appointments are after yours.  Also, if you miss three or more appointments without notifying the office, you may be dismissed from the clinic at the provider's discretion.      For prescription refill requests, have your pharmacy contact our office and allow 72 hours for refills to be completed.    Today you received the following chemotherapy and/or immunotherapy agents Kanjinti, Perjeta.      To help prevent nausea and vomiting after your treatment, we encourage you to take your nausea medication as directed.  BELOW ARE SYMPTOMS THAT SHOULD BE REPORTED IMMEDIATELY: *FEVER GREATER THAN 100.4 F (38 C) OR HIGHER *CHILLS OR SWEATING *NAUSEA AND VOMITING THAT IS NOT CONTROLLED WITH YOUR NAUSEA MEDICATION *UNUSUAL SHORTNESS OF BREATH *UNUSUAL BRUISING OR BLEEDING *URINARY PROBLEMS (pain or burning when urinating, or frequent urination) *BOWEL PROBLEMS (unusual diarrhea, constipation, pain near the anus) TENDERNESS IN MOUTH AND THROAT WITH OR WITHOUT PRESENCE OF ULCERS (sore throat, sores in mouth, or a toothache) UNUSUAL RASH, SWELLING OR PAIN  UNUSUAL VAGINAL DISCHARGE OR ITCHING   Items with * indicate a potential emergency and should be followed up as soon as possible or go to the Emergency Department if any problems should occur.  Please show the CHEMOTHERAPY ALERT CARD or IMMUNOTHERAPY ALERT CARD  at check-in to the Emergency Department and triage nurse.  Should you have questions after your visit or need to cancel or reschedule your appointment, please contact [place] [facility]  Dept: [phone]  and follow the prompts.  Office hours are 8:00 a.m. to 4:30 p.m. Monday - Friday. Please note that voicemails left after 4:00 p.m. may not be returned until the following business day.  We are closed weekends and major holidays. You have access to a nurse at all times for urgent questions. Please call the main number to the clinic Dept: [phone] and follow the prompts.   For any non-urgent questions, you may also contact your provider using MyChart. We now offer e-Visits for anyone 18 and older to request care online for non-urgent symptoms. For details visit [email or web address].   Also download the MyChart app! Go to the app store, search "MyChart", open the app, select [facility], and log in with your MyChart username and password.   

## 2023-07-11 NOTE — Assessment & Plan Note (Signed)
02/11/19: RT lumpectomy: DICS Intermediate grade, Margins Neg, ER 90%, PR 70%; Lt Lumpectomy: UDH status post radiation, could not tolerate tamoxifen recurrence: 08/15/2021:MRI surveillance detected Right breast calcifications 5 cm, Ant biopsy: IG DCIS with necrosis, ER 40%, PR 0%, Posterior biopsy: Intermediate grade DCIS with [facility]  11/29/2021: Right mastectomy: High-grade DCIS with microinvasion, ER 0%, PR 0%, HER2 positive, 0/1 lymph node negative  T1c MIC, N0: Stage Ia   Patient had second opinion at [facility] with Dr. Creig Hines who agreed with her plan of no systemic chemotherapy   Breast cancer surveillance: 1.  Mammogram 08/22/2022: Left breast benign breast density category B  2.  CT CAP 12/09/2022: Multiple liver lesions indicative of cysts and hemangiomas new right hepatic lobe lesion favored to be hemangioma otherwise no evidence of metastatic disease.  Radiology recommended an MRI of the liver. 3.  Liver MRI 01/08/2023: 2 New rim-enhancing lesions 2.3 cm (was 1.6 cm on 12/09/2022), 1.8 cm (measured 1.5 cm), benign hemangioma 2.4 cm and 1.9 cm and 1.3 cm unchanged 4.  PET CT scan 01/22/2023: Liver: 2.5 cm and 2.4 cm lesions ------------------------------------------------------------------------------------------------------------------------------------------------- 01/31/2023: Liver lesion biopsy: Metastatic adenocarcinoma to the liver consistent with breast primary positive for CK7 and GATA3, ER 0%, PR 0%, Ki-67 25%, HER2 3+ positive, Caris molecular testing is pending   Current treatment: Completed 6 cycles of Taxotere Herceptin Perjeta on 05/30/2023, currently on maintenance Herceptin and Perjeta Herceptin Perjeta toxicities: Tolerating it extremely well. She saw Dr. Roland Earl at [facility] for second opinion who agreed with the treatment plan.   Meningioma: Follows with Dr. Barbaraann Cao, brain MRI ordered for 08/04/2023 CT CAP 04/17/2023: Slight progression of metastatic lesions in the liver 1.6 cm (was 2.3  cm), 1.4 cm (was previously 1.8 cm), previously noted small pulmonary nodules 4 mm or less felt to be benign PET/CT: 06/19/2023: Interval resolution of the hypermetabolic lesions in the liver.  Recommendation: Herceptin Perjeta maintenance therapy every 3 weeks Our plan is to obtain PET CT scan in October 2024.

## 2023-07-11 NOTE — Progress Notes (Signed)
Patient Care Team: [name], MD as PCP - General (Family Medicine) Donnelly Angelica, RN as Oncology Nurse Navigator Pershing Proud, RN as Oncology Nurse Navigator Serena Croissant, MD as Consulting Physician (Hematology and Oncology) Default, Provider, MD as Technician  DIAGNOSIS:  Encounter Diagnosis  Name Primary?   Carcinoma of breast metastatic to liver, unspecified laterality (HCC) Yes    SUMMARY OF ONCOLOGIC HISTORY: Oncology History  Breast cancer metastasized to liver (HCC)  01/25/2019 Initial Diagnosis   Screening mammogram showed bilateral calcifications right breast 5 mm, pleomorphic.  Left breast 1.6 cm punctate, smaller group of calcifications spanning 3 mm.  Right lower outer quadrant biopsy revealed low-grade DCIS ER 90%, PR 70%; biopsy of left breast calcifications ALH and PASH   01/31/2019 Genetic Testing   ATM c.8495G>A and RECQL4 c.2587G>A VUS identified on the 9-gene STAT panel and Multi-cancer panel through [facility].  The STAT Breast cancer panel offered by [facility] includes sequencing and rearrangement analysis for the following 9 genes:  ATM, BRCA1, BRCA2, CDH1, CHEK2, PALB2, PTEN, STK11 and TP53.   The Multi-Gene Panel offered by [facility] includes sequencing and/or deletion duplication testing of the following 84 genes: AIP, ALK, APC, ATM, AXIN2,BAP1,  BARD1, BLM, BMPR1A, BRCA1, BRCA2, BRIP1, CASR, CDC73, CDH1, CDK4, CDKN1B, CDKN1C, CDKN2A (p14ARF), CDKN2A (p16INK4a), CEBPA, CHEK2, CTNNA1, DICER1, DIS3L2, EGFR (c.2369C>T, p.Thr790Met variant only), EPCAM (Deletion/duplication testing only), FH, FLCN, GATA2, GPC3, GREM1 (Promoter region deletion/duplication testing only), HOXB13 (c.251G>A, p.Gly84Glu), HRAS, KIT, MAX, MEN1, MET, MITF (c.952G>A, p.Glu318Lys variant only), MLH1, MSH2, MSH3, MSH6, MUTYH, NBN, NF1, NF2, NTHL1, PALB2, PDGFRA, PHOX2B, PMS2, POLD1, POLE, POT1, PRKAR1A, PTCH1, PTEN, RAD50, RAD51C, RAD51D, RB1, RECQL4, RET, RUNX1, SDHAF2, SDHA (sequence changes  only), SDHB, SDHC, SDHD, SMAD4, SMARCA4, SMARCB1, SMARCE1, STK11, SUFU, TERC, TERT, TMEM127, TP53, TSC1, TSC2, VHL, WRN and WT1.  he report date is February 02, 2019.   02/11/2019 Surgery   RT lumpectomy: DICS Intermediate grade, Margins Neg, ER 90%, PR 70%; Lt Lumpectomy: UDH   02/26/2019 -  Anti-estrogen oral therapy   Tamoxifen 20mg  daily, stopped because of depression and facial numbness   05/20/2019 - 06/17/2019 Radiation Therapy   Adjuvant XRT   08/15/2021 Relapse/Recurrence   MRI surveillance detected Right breast calcifications 5 cm, Ant biopsy: IG DCIS with necrosis, ER 40%, PR 0%, Posterior biopsy: Intermediate grade DCIS with [facility]   08/19/2022 Cancer Staging   Staging form: Breast, AJCC 8th Edition - Clinical: Stage IV (cT76mi, cNX, cM1, G3, ER: Unknown, PR: Unknown, HER2: Unknown) - Signed by Serena Croissant, MD on 02/06/2023 Histologic grading system: 3 grade system   02/14/2023 -  Chemotherapy   Patient is on Treatment Plan : BREAST DOCEtaxel + Trastuzumab + Pertuzumab (THP) q21d x 8 cycles / Trastuzumab + Pertuzumab q21d x 4 cycles     AML (acute myeloid leukemia) (HCC)  09/1998 Initial Diagnosis   AML (acute myeloid leukemia) (HCC) treated with 2 induction regimens followed by 6 consolidation regimens, remission     CHIEF COMPLIANT: Taxotere Herceptin Perjeta cycle   INTERVAL HISTORY: Joann Meadows is a 47 y.o. with above-mentioned history of right breast DCIS for which she underwent a lumpectomy. She presents to the clinic today for a follow-up. Patient reports things are better. She saysshe had a lot of diarrhea. After 3 days she takes imodium. She says it is severe. For the past few days she has been having joint stiffness.   ALLERGIES:  is allergic to levaquin [levofloxacin], tamoxifen citrate, and tape.  MEDICATIONS:  Current  Outpatient Medications  Medication Sig Dispense Refill   ALPRAZolam (XANAX) 0.25 MG tablet 1 tablet Orally once a day if needed for anxiety/panic  (not a daily medication, avoid regular use) for 30 days     amoxicillin (AMOXIL) 500 MG tablet Take 1 tablet (500 mg total) by mouth 2 (two) times daily. 14 tablet 0   amoxicillin-clavulanate (AUGMENTIN) 875-125 MG tablet Take 1 tablet by mouth every 12 (twelve) hours. 14 tablet 0   azithromycin (ZITHROMAX Z-PAK) 250 MG tablet Take as directed 6 each 0   cholecalciferol (VITAMIN D3) 25 MCG (1000 UNIT) tablet Take 2,000 Units by mouth daily.     ciprofloxacin-dexamethasone (CIPRODEX) OTIC suspension SMARTSIG:4 Drop(s) Right Ear Twice Daily     DEXAMETHASONE PO Take by mouth. Taking day prior to treatment and day after treatment     hydrochlorothiazide (HYDRODIURIL) 25 MG tablet TAKE 1 TABLET BY MOUTH EVERY DAY IN THE MORNING FOR 30 DAYS ONCE A DAY     KLOR-CON M20 20 MEQ tablet TAKE 1 TABLET BY MOUTH EVERY DAY 90 tablet 1   magic mouthwash (lidocaine, diphenhydrAMINE, alum & mag hydroxide) suspension Swish and swallow 5 mLs 4 (four) times daily as needed for mouth pain. 360 mL 0   magic mouthwash w/lidocaine SOLN Take 5 mLs by mouth 4 (four) times daily as needed for mouth pain. 240 mL 2   olmesartan (BENICAR) 5 MG tablet Take by mouth.     ondansetron (ZOFRAN) 8 MG tablet Take 1 tablet (8 mg total) by mouth every 8 (eight) hours as needed for nausea or vomiting. 30 tablet 1   oxyCODONE (ROXICODONE) 5 MG immediate release tablet Take 1 tablet (5 mg total) by mouth every 4 (four) hours as needed for severe pain. 15 tablet 0   sucralfate (CARAFATE) 1 g tablet Dissolve 1 tablet in 8 oz of water then drink every 8 hours 12 tablet 0   No current facility-administered medications for this visit.    PHYSICAL EXAMINATION: ECOG PERFORMANCE STATUS: 1 - Symptomatic but completely ambulatory  Vitals:   07/11/23 1019  BP: (!) 156/96  Pulse: 81  Resp: 18  Temp: (!) 97.3 F (36.3 C)  SpO2: 100%   Filed Weights   07/11/23 1019  Weight: 175 lb 3.2 oz (79.5 kg)     LABORATORY DATA:  I have reviewed  the data as listed    Latest Ref Rng & Units 06/20/2023    9:25 AM 05/28/2023    9:53 AM 05/09/2023   10:19 AM  CMP  Glucose 70 - 99 mg/dL 94  92  409   BUN 6 - 20 mg/dL 31  15  24    Creatinine 0.44 - 1.00 mg/dL 8.11  9.14  7.82   Sodium 135 - 145 mmol/L 139  140  141   Potassium 3.5 - 5.1 mmol/L 3.4  4.7  3.4   Chloride 98 - 111 mmol/L 108  107  108   CO2 22 - 32 mmol/L 23  29  26    Calcium 8.9 - 10.3 mg/dL 9.4  9.4  9.2   Total Protein 6.5 - 8.1 g/dL 6.5  6.4  6.3   Total Bilirubin 0.3 - 1.2 mg/dL 0.2  0.3  0.4   Alkaline Phos 38 - 126 U/L 81  68  62   AST 15 - 41 U/L 12  16  16    ALT 0 - 44 U/L 12  19  23      Lab Results  Component  Value Date   WBC 5.2 07/11/2023   HGB 12.3 07/11/2023   HCT 37.3 07/11/2023   MCV 85.4 07/11/2023   PLT 243 07/11/2023   NEUTROABS 2.4 07/11/2023    ASSESSMENT & PLAN:  Breast cancer metastasized to liver (HCC) 02/11/19: RT lumpectomy: DICS Intermediate grade, Margins Neg, ER 90%, PR 70%; Lt Lumpectomy: UDH status post radiation, could not tolerate tamoxifen recurrence: 08/15/2021:MRI surveillance detected Right breast calcifications 5 cm, Ant biopsy: IG DCIS with necrosis, ER 40%, PR 0%, Posterior biopsy: Intermediate grade DCIS with [facility]  11/29/2021: Right mastectomy: High-grade DCIS with microinvasion, ER 0%, PR 0%, HER2 positive, 0/1 lymph node negative  T1c MIC, N0: Stage Ia   Patient had second opinion at [facility] with Dr. Creig Hines who agreed with her plan of no systemic chemotherapy   Breast cancer surveillance: 1.  Mammogram 08/22/2022: Left breast benign breast density category B  2.  CT CAP 12/09/2022: Multiple liver lesions indicative of cysts and hemangiomas new right hepatic lobe lesion favored to be hemangioma otherwise no evidence of metastatic disease.  Radiology recommended an MRI of the liver. 3.  Liver MRI 01/08/2023: 2 New rim-enhancing lesions 2.3 cm (was 1.6 cm on 12/09/2022), 1.8 cm (measured 1.5 cm), benign hemangioma 2.4 cm and 1.9  cm and 1.3 cm unchanged 4.  PET CT scan 01/22/2023: Liver: 2.5 cm and 2.4 cm lesions ------------------------------------------------------------------------------------------------------------------------------------------------- 01/31/2023: Liver lesion biopsy: Metastatic adenocarcinoma to the liver consistent with breast primary positive for CK7 and GATA3, ER 0%, PR 0%, Ki-67 25%, HER2 3+ positive, [facility] molecular testing is pending   Current treatment: Completed 6 cycles of Taxotere Herceptin Perjeta on 05/30/2023, currently on maintenance Herceptin and Perjeta Herceptin Perjeta toxicities:  Diarrhea: Patient is taking antidiarrheal medications not very often. Hypokalemia secondary to diarrhea: Patient stopped taking potassium. Fatigue She saw Dr. Roland Earl at [facility] for second opinion who agreed with the treatment plan.   Meningioma: Follows with Dr. Barbaraann Cao, brain MRI ordered for 08/04/2023 CT CAP 04/17/2023: Slight progression of metastatic lesions in the liver 1.6 cm (was 2.3 cm), 1.4 cm (was previously 1.8 cm), previously noted small pulmonary nodules 4 mm or less felt to be benign PET/CT: 06/19/2023: Interval resolution of the hypermetabolic lesions in the liver.  Recommendation: Herceptin Perjeta maintenance therapy every 3 weeks Our plan is to obtain PET CT scan in October 2024.    No orders of the defined types were placed in this encounter.  The patient has a good understanding of the overall plan. she agrees with it. she will call with any problems that may develop before the next visit here. Total time spent: 30 mins including face to face time and time spent for planning, charting and co-ordination of care   Tamsen Meek, MD 07/11/23    I Janan Ridge am acting as a Neurosurgeon for [employer]  I have reviewed the above documentation for accuracy and completeness, and I agree with the above.

## 2023-07-12 ENCOUNTER — Other Ambulatory Visit: Payer: Self-pay

## 2023-07-14 ENCOUNTER — Telehealth: Payer: Self-pay

## 2023-07-14 NOTE — Telephone Encounter (Signed)
Pt called and states she is feeling more tired after 8/16 tx than normal. She states she has been experiencing h/a and is taking tylenol which helps. Pt is scheduled for MRI brain 9/9. If sx persist she will call us back to move up scans.   Encouraged pt to drink plenty of fluids and rest, alternate tylenol/ibruprofen for sx relief. If h/a persists with these instruction she will call us back,

## 2023-07-21 ENCOUNTER — Ambulatory Visit: Payer: BC Managed Care – PPO | Admitting: Hematology and Oncology

## 2023-07-24 ENCOUNTER — Telehealth: Payer: Self-pay

## 2023-07-24 NOTE — Telephone Encounter (Signed)
Pt called and states her 49 y/o son tested positive for COVID and she is concerned regarding her tx scheduled next week. Pt denies sx at this time. She was advised if she remains asymptomatic she can still come to her appt, but wear a mask. If she becomes symptomatic and tests positive we will need to r/s her appt. She was advised to take Vitamin D, Vitamin C and Zinc per MD to help boost immunity. She verbalized thanks and understanding.

## 2023-07-30 ENCOUNTER — Telehealth: Payer: Self-pay | Admitting: *Deleted

## 2023-07-30 MED ORDER — FLUCONAZOLE 100 MG PO TABS: ORAL_TABLET | ORAL | 0 refills | Status: DC

## 2023-07-30 NOTE — Telephone Encounter (Signed)
This RN spoke with pt per her call stating onset of vaginal itching and discharge " like a yeast infection"  This RN sent in prescription for diflucan.  Pt is scheduled for lab md and treatment this Friday the 6th.

## 2023-07-30 NOTE — Progress Notes (Signed)
[facility] OFFICE PROGRESS NOTE  Koirala, Dibas, MD [address] [place] [address]  DIAGNOSIS: Carcinoma of breast metastatic to liver, unspecified laterality [facility])   Oncology History  Breast cancer metastasized to liver (HCC)  01/25/2019 Initial Diagnosis   Screening mammogram showed bilateral calcifications right breast 5 mm, pleomorphic.  Left breast 1.6 cm punctate, smaller group of calcifications spanning 3 mm.  Right lower outer quadrant biopsy revealed low-grade DCIS ER 90%, PR 70%; biopsy of left breast calcifications ALH and PASH   01/31/2019 Genetic Testing   ATM c.8495G>A and RECQL4 c.2587G>A VUS identified on the 9-gene STAT panel and Multi-cancer panel through [facility].  The STAT Breast cancer panel offered by [facility] includes sequencing and rearrangement analysis for the following 9 genes:  ATM, BRCA1, BRCA2, CDH1, CHEK2, PALB2, PTEN, STK11 and TP53.   The Multi-Gene Panel offered by [facility] includes sequencing and/or deletion duplication testing of the following 84 genes: AIP, ALK, APC, ATM, AXIN2,BAP1,  BARD1, BLM, BMPR1A, BRCA1, BRCA2, BRIP1, CASR, CDC73, CDH1, CDK4, CDKN1B, CDKN1C, CDKN2A (p14ARF), CDKN2A (p16INK4a), CEBPA, CHEK2, CTNNA1, DICER1, DIS3L2, EGFR (c.2369C>T, p.Thr790Met variant only), EPCAM (Deletion/duplication testing only), FH, FLCN, GATA2, GPC3, GREM1 (Promoter region deletion/duplication testing only), HOXB13 (c.251G>A, p.Gly84Glu), HRAS, KIT, MAX, MEN1, MET, MITF (c.952G>A, p.Glu318Lys variant only), MLH1, MSH2, MSH3, MSH6, MUTYH, NBN, NF1, NF2, NTHL1, PALB2, PDGFRA, PHOX2B, PMS2, POLD1, POLE, POT1, PRKAR1A, PTCH1, PTEN, RAD50, RAD51C, RAD51D, RB1, RECQL4, RET, RUNX1, SDHAF2, SDHA (sequence changes only), SDHB, SDHC, SDHD, SMAD4, SMARCA4, SMARCB1, SMARCE1, STK11, SUFU, TERC, TERT, TMEM127, TP53, TSC1, TSC2, VHL, WRN and WT1.  he report date is February 02, 2019.   02/11/2019 Surgery   RT lumpectomy: DICS Intermediate grade, Margins  Neg, ER 90%, PR 70%; Lt Lumpectomy: UDH   02/26/2019 -  Anti-estrogen oral therapy   Tamoxifen 20mg  daily, stopped because of depression and facial numbness   05/20/2019 - 06/17/2019 Radiation Therapy   Adjuvant XRT   08/15/2021 Relapse/Recurrence   MRI surveillance detected Right breast calcifications 5 cm, Ant biopsy: IG DCIS with necrosis, ER 40%, PR 0%, Posterior biopsy: Intermediate grade DCIS with [facility]   08/19/2022 Cancer Staging   Staging form: Breast, AJCC 8th Edition - Clinical: Stage IV (cT57mi, cNX, cM1, G3, ER: Unknown, PR: Unknown, HER2: Unknown) - Signed by Serena Croissant, MD on 02/06/2023 Histologic grading system: 3 grade system   02/14/2023 -  Chemotherapy   Patient is on Treatment Plan : BREAST DOCEtaxel + Trastuzumab + Pertuzumab (THP) q21d x 8 cycles / Trastuzumab + Pertuzumab q21d x 4 cycles     AML (acute myeloid leukemia) (HCC)  09/1998 Initial Diagnosis   AML (acute myeloid leukemia) (HCC) treated with 2 induction regimens followed by 6 consolidation regimens, remission     CURRENT THERAPY: Taxotere Herceptin Perjeta cycle #9 today.   INTERVAL HISTORY: Joann Meadows 47 y.o. female returns to the a clinic today for a follow-up visit.  She was last seen by Dr. Pamelia Hoit on 07/11/2023.  She is currently undergoing treatment with Taxotere, Herceptin, and Perjeta.  She is here today for cycle #9.  In the interval since last being seen, she called on 07/24/2023 stating that her son tested positive for COVID.  The patient tested herself yesterday and was negative. She denies any symptoms. She denies any nasal congestion, sore throat, cough, or headaches, loss of taste and smell, or unusual fatigue.  She called a few days ago on 07/30/23 with with symptoms that feel similar to a yeast infection,  which she had in the past. She was called in a prescription of diflucan. She reports it took about a week for her symptoms to improve last time she had yeast infection. She reports her symptoms  are a little bit better since starting diflucan but she is still having symptoms. She reports vaginal itching and discharge. Denies dysuria, ulcers, or skin peeling. She has not checked the exterior genital area for any changes. She actually is scheduled for a pap smear with her gynecologist on Tuesday of next week. She denies any new sexual partners as she is in a monogamous relationship, new lotions, soaps, or detergents. She does take baths and uses a bidet toilet since she struggles with diarrhea, so she does reports extra moisture in this area.   She does struggle with constipation with her treatment. It is random so it is difficult for her to predict the pattern of when it will occur. She does state that when she takes imodium, that it does resolve her diarrhea, however, sometimes she will have 6-8 episodes of diarrhea before she tries to take imodium. Her last BM was earlier today and it was fairly normal consistently/mildly loose. She struggles with hypokalemia. She is prescribed potassium supplements. She takes 30 meq daily. However, over the last two weeks, she reports she may have skipped her dose about 5-6 times.   She called also endorsing worsening fatigue and headaches.  She has a brain MRI scheduled for 08/04/2023. She is scheduled to see Dr. Barbaraann Cao a few days later.  He was advised to continue taking Tylenol and ibuprofen for symptom relief helps. She reports her headaches are stable/have been doing better. She localizes her headache to her forehead. Of note, she takes sudafed, claritin, and flonase for allergies.   Her energy is stable. She reports some days she has really good energy. However, sometimes she overdoes the activity on days she feels well which then can cause fatigue following this. Denies any fever, chills, night sweats, or unexplained weight loss.  She gets occasional hot flashes. Denies any chest pain, shortness of breath, cough, or hemoptysis.  Denies peripheral neuropathy.  Denies any vomiting. She may have mild nausea but it is controlled with her anti-emetic.  She sometimes has joint stiffness if she is not active. She denies any rashes or skin changes except she has some skin sensitivity to some adhesives/tapes used over her port. She uses benadryl cream and hydrocortisone cream.  She is here today for repeat blood work and evaluation before undergoing her next cycle of treatment.   MEDICAL HISTORY: Past Medical History:  Diagnosis Date   Breast cancer (HCC) 2020   Right Breast Cancer   Endometriosis    Fibroid    Hx of radiation therapy 06/2019   Leukemia (HCC)    20 years ago   Personal history of radiation therapy 2020   Right Breast Cancer   PONV (postoperative nausea and vomiting)    pt states she vomits after colonoscopies    ALLERGIES:  is allergic to levaquin [levofloxacin], tamoxifen citrate, and tape.  MEDICATIONS:  Current Outpatient Medications  Medication Sig Dispense Refill   ALPRAZolam (XANAX) 0.25 MG tablet 1 tablet Orally once a day if needed for anxiety/panic (not a daily medication, avoid regular use) for 30 days     amoxicillin (AMOXIL) 500 MG tablet Take 1 tablet (500 mg total) by mouth 2 (two) times daily. 14 tablet 0   amoxicillin-clavulanate (AUGMENTIN) 875-125 MG tablet Take 1 tablet by mouth  every 12 (twelve) hours. 14 tablet 0   azithromycin (ZITHROMAX Z-PAK) 250 MG tablet Take as directed 6 each 0   cholecalciferol (VITAMIN D3) 25 MCG (1000 UNIT) tablet Take 2,000 Units by mouth daily.     ciprofloxacin-dexamethasone (CIPRODEX) OTIC suspension SMARTSIG:4 Drop(s) Right Ear Twice Daily     DEXAMETHASONE PO Take by mouth. Taking day prior to treatment and day after treatment     fluconazole (DIFLUCAN) 100 MG tablet Take 2 tablets today then 1 tablet daily for the next 2 days 4 tablet 0   hydrochlorothiazide (HYDRODIURIL) 25 MG tablet TAKE 1 TABLET BY MOUTH EVERY DAY IN THE MORNING FOR 30 DAYS ONCE A DAY     KLOR-CON M20 20  MEQ tablet TAKE 1 TABLET BY MOUTH EVERY DAY 90 tablet 1   magic mouthwash (lidocaine, diphenhydrAMINE, alum & mag hydroxide) suspension Swish and swallow 5 mLs 4 (four) times daily as needed for mouth pain. 360 mL 0   magic mouthwash w/lidocaine SOLN Take 5 mLs by mouth 4 (four) times daily as needed for mouth pain. 240 mL 2   olmesartan (BENICAR) 5 MG tablet Take by mouth.     ondansetron (ZOFRAN) 8 MG tablet Take 1 tablet (8 mg total) by mouth every 8 (eight) hours as needed for nausea or vomiting. 30 tablet 1   oxyCODONE (ROXICODONE) 5 MG immediate release tablet Take 1 tablet (5 mg total) by mouth every 4 (four) hours as needed for severe pain. 15 tablet 0   sucralfate (CARAFATE) 1 g tablet Dissolve 1 tablet in 8 oz of water then drink every 8 hours 12 tablet 0   No current facility-administered medications for this visit.   Facility-Administered Medications Ordered in Other Visits  Medication Dose Route Frequency Provider Last Rate Last Admin   heparin lock flush 100 unit/mL  500 Units Intracatheter Once PRN Serena Croissant, MD       pertuzumab (PERJETA) 420 mg in sodium chloride 0.9 % 250 mL chemo infusion  420 mg Intravenous Once Serena Croissant, MD 528 mL/hr at 08/01/23 1332 420 mg at 08/01/23 1332   sodium chloride flush (NS) 0.9 % injection 10 mL  10 mL Intracatheter PRN Serena Croissant, MD        SURGICAL HISTORY:  Past Surgical History:  Procedure Laterality Date   BREAST EXCISIONAL BIOPSY Left 02/11/2019   [facility]   BREAST LUMPECTOMY Right 02/11/2019   BREAST LUMPECTOMY WITH RADIOACTIVE SEED LOCALIZATION Bilateral 02/11/2019   Procedure: BILATERAL BREAST LUMPECTOMIES WITH BILATERAL RADIOACTIVE SEED LOCALIZATION;  Surgeon: Manus Rudd, MD;  Location: [facility];  Service: General;  Laterality: Bilateral;   IR IMAGING GUIDED PORT INSERTION  02/25/2023   IR RADIOLOGIST EVAL & MGMT  02/27/2023   MASTECTOMY Right 11/2021    REVIEW OF SYSTEMS:   Review of Systems   Constitutional: Positive for stable fatigue. Negative for appetite change, chills,  fever and unexpected weight change.  HENT: Negative for mouth sores, nosebleeds, sore throat and trouble swallowing.   Eyes: Negative for eye problems and icterus.  Respiratory: Negative for cough, hemoptysis, shortness of breath and wheezing.   Cardiovascular: Negative for chest pain and leg swelling.  Gastrointestinal: Positive for intermittent diarrhea. Negative for abdominal pain, constipation, nausea and vomiting.  Genitourinary: Positive for vaginal itching and discharge. Negative for bladder incontinence, difficulty urinating, dysuria, frequency and hematuria.   Musculoskeletal: Negative for back pain, gait problem, neck pain and neck stiffness.  Skin: Negative for itching and rash (none at this  time).  Neurological: Positive for improved headaches. Negative for dizziness, extremity weakness, gait problem, light-headedness and seizures.  Hematological: Negative for adenopathy. Does not bruise/bleed easily.  Psychiatric/Behavioral: Negative for confusion, depression and sleep disturbance. The patient is not nervous/anxious.     PHYSICAL EXAMINATION:  Blood pressure (!) 150/85, pulse 78, temperature 98.1 F (36.7 C), temperature source Oral, resp. rate 18, height 5\' 5"  (1.651 m), weight 174 lb 14.4 oz (79.3 kg), SpO2 100%.  ECOG PERFORMANCE STATUS: 1  Physical Exam  Constitutional: Oriented to person, place, and time and well-developed, well-nourished, and in no distress.  HENT:  Head: Normocephalic and atraumatic.  Mouth/Throat: Oropharynx is clear and moist. No oropharyngeal exudate.  Eyes: Conjunctivae are normal. Right eye exhibits no discharge. Left eye exhibits no discharge. No scleral icterus.  Neck: Normal range of motion. Neck supple.  Cardiovascular: Normal rate, regular rhythm, normal heart sounds and intact distal pulses.   Pulmonary/Chest: Effort normal and breath sounds normal. No  respiratory distress. No wheezes. No rales.  Abdominal: Soft. Bowel sounds are normal. Exhibits no distension and no mass. There is no tenderness.  Gynecologic: External exam did not show any erythema, ulcerations, or peeling. Mild erythema of the mucosa at the vaginal orifice.  Musculoskeletal: Normal range of motion. Exhibits no edema.  Lymphadenopathy:    No cervical adenopathy.  Neurological: Alert and oriented to person, place, and time. Exhibits normal muscle tone. Gait normal. Coordination normal.  Skin: Skin is warm and dry. No rash noted. Not diaphoretic. No erythema. No pallor.  Psychiatric: Mood, memory and judgment normal.  Vitals reviewed.  LABORATORY DATA: Lab Results  Component Value Date   WBC 5.5 08/01/2023   HGB 12.1 08/01/2023   HCT 37.0 08/01/2023   MCV 85.5 08/01/2023   PLT 211 08/01/2023      Chemistry      Component Value Date/Time   NA 138 08/01/2023 1047   K 3.1 (L) 08/01/2023 1047   CL 106 08/01/2023 1047   CO2 26 08/01/2023 1047   BUN 14 08/01/2023 1047   CREATININE 0.67 08/01/2023 1047      Component Value Date/Time   CALCIUM 8.6 (L) 08/01/2023 1047   ALKPHOS 84 08/01/2023 1047   AST 21 08/01/2023 1047   ALT 27 08/01/2023 1047   BILITOT 0.4 08/01/2023 1047       RADIOGRAPHIC STUDIES:  No results found.   ASSESSMENT/PLAN:   Breast cancer metastasized to liver (HCC) 02/11/19: RT lumpectomy: DICS Intermediate grade, Margins Neg, ER 90%, PR 70%; Lt Lumpectomy: UDH status post radiation, could not tolerate tamoxifen recurrence: 08/15/2021:MRI surveillance detected Right breast calcifications 5 cm, Ant biopsy: IG DCIS with necrosis, ER 40%, PR 0%, Posterior biopsy: Intermediate grade DCIS with [facility]  11/29/2021: Right mastectomy: High-grade DCIS with microinvasion, ER 0%, PR 0%, HER2 positive, 0/1 lymph node negative  T1c MIC, N0: Stage Ia   Patient had second opinion at [facility] with Dr. Creig Hines who agreed with her plan of no systemic  chemotherapy   Breast cancer surveillance: 1.  Mammogram 08/22/2022: Left breast benign breast density category B  2.  CT CAP 12/09/2022: Multiple liver lesions indicative of cysts and hemangiomas new right hepatic lobe lesion favored to be hemangioma otherwise no evidence of metastatic disease.  Radiology recommended an MRI of the liver. 3.  Liver MRI 01/08/2023: 2 New rim-enhancing lesions 2.3 cm (was 1.6 cm on 12/09/2022), 1.8 cm (measured 1.5 cm), benign hemangioma 2.4 cm and 1.9 cm and 1.3 cm unchanged 4.  PET CT scan 01/22/2023: Liver: 2.5 cm and 2.4 cm lesions ------------------------------------------------------------------------------------------------------------------------------------------------- 01/31/2023: Liver lesion biopsy: Metastatic adenocarcinoma to the liver consistent with breast primary positive for CK7 and GATA3, ER 0%, PR 0%, Ki-67 25%, HER2 3+ positive, Caris testing was performed.   Current treatment: Completed 6 cycles of Taxotere Herceptin Perjeta on 05/30/2023, currently on maintenance Herceptin and Perjeta Herceptin Perjeta toxicities:  Diarrhea: Patient is taking antidiarrheal medications not very often. Discussed the importance of taking this as soon as she has 1-2 episodes of diarrhea to avoid electrolyte derangements or dehydration. Imodium does resolve her symptoms when she takes it.  Hypokalemia secondary to diarrhea: Her potassium is low at 3.1 today. Takes 1.5 tablets per day of 20 meq. Estimates she missed about 5-6 days over the last 2 week period. Instructed to take 1 tablet BID for 5 days, then resume her 1.5 mg daily. She was educated on increasing potassium rich food. Also advised to increase water, gatorade, or liquid IV if ever having significant diarrhea.  Vaginal itching/discharge. Continue diflucan. Scheduled to see Gyn on Tuesday next week. If she is still having symptoms, likely her GYN will check wet prep and speculum exam. If worsening symptoms over the  weekend, let her know monistat is OTC. No abnormalities on external exam. No need for nystatin powder. Did provide patient with education regarding risk factors for yeast infections. UA was performed today which was negative for infection.  Fatigue: Stable She saw Dr. Roland Earl at [facility] for second opinion who agreed with the treatment plan.   Meningioma: Follows with Dr. Barbaraann Cao, brain MRI ordered for 08/04/2023.  CT CAP 04/17/2023: Slight progression of metastatic lesions in the liver 1.6 cm (was 2.3 cm), 1.4 cm (was previously 1.8 cm), previously noted small pulmonary nodules 4 mm or less felt to be benign PET/CT: 06/19/2023: Interval resolution of the hypermetabolic lesions in the liver.   Recommendation: Herceptin Perjeta maintenance therapy every 3 weeks.  Our plan is to obtain PET CT scan in October 2024. She is scheduled to see Dr. Pamelia Hoit later this month and he will arrange for PET scan at that time.     Plan Will proceed with treatment as scheduled -UA/culture today -Increase potassium to 1 tablet BID for 5 days, before resuming to her maintenance dose of 1.5 tablets daily.  -Education on potassium rich food -Diarrhea education  -Brain MRI as scheduled next week -See GYN as scheduled next week. Will continue diflucan for now. Will see GYN if symptoms do not improve as anticipated -Follow up with Dr. Pamelia Hoit as scheduled in 3 weeks. He will order PET scan at her next appointment   Orders Placed This Encounter  Procedures   Culture, Urine    Standing Status:   Future    Number of Occurrences:   1    Standing Expiration Date:   07/31/2024   Urinalysis, Complete w Microscopic    Standing Status:   Future    Number of Occurrences:   1    Standing Expiration Date:   07/31/2024     The total time spent in the appointment was 30-39 minutes  Omero Kowal L Khyle Goodell, PA-C 08/01/23

## 2023-08-01 ENCOUNTER — Inpatient Hospital Stay: Payer: BC Managed Care – PPO | Admitting: Physician Assistant

## 2023-08-01 ENCOUNTER — Other Ambulatory Visit: Payer: Self-pay

## 2023-08-01 ENCOUNTER — Inpatient Hospital Stay: Payer: BC Managed Care – PPO

## 2023-08-01 ENCOUNTER — Inpatient Hospital Stay: Payer: BC Managed Care – PPO | Attending: Hematology and Oncology

## 2023-08-01 VITALS — BP 150/85 | HR 78 | Temp 98.1°F | Resp 18 | Ht 65.0 in | Wt 174.9 lb

## 2023-08-01 VITALS — BP 144/88 | HR 74

## 2023-08-01 DIAGNOSIS — Z5111 Encounter for antineoplastic chemotherapy: Secondary | ICD-10-CM | POA: Diagnosis not present

## 2023-08-01 DIAGNOSIS — N898 Other specified noninflammatory disorders of vagina: Secondary | ICD-10-CM | POA: Diagnosis not present

## 2023-08-01 DIAGNOSIS — D0511 Intraductal carcinoma in situ of right breast: Secondary | ICD-10-CM | POA: Insufficient documentation

## 2023-08-01 DIAGNOSIS — Z95828 Presence of other vascular implants and grafts: Secondary | ICD-10-CM

## 2023-08-01 DIAGNOSIS — D329 Benign neoplasm of meninges, unspecified: Secondary | ICD-10-CM | POA: Insufficient documentation

## 2023-08-01 DIAGNOSIS — Z87891 Personal history of nicotine dependence: Secondary | ICD-10-CM | POA: Diagnosis not present

## 2023-08-01 DIAGNOSIS — K769 Liver disease, unspecified: Secondary | ICD-10-CM

## 2023-08-01 DIAGNOSIS — C50919 Malignant neoplasm of unspecified site of unspecified female breast: Secondary | ICD-10-CM

## 2023-08-01 DIAGNOSIS — C787 Secondary malignant neoplasm of liver and intrahepatic bile duct: Secondary | ICD-10-CM

## 2023-08-01 DIAGNOSIS — Z5112 Encounter for antineoplastic immunotherapy: Secondary | ICD-10-CM | POA: Diagnosis not present

## 2023-08-01 DIAGNOSIS — Z923 Personal history of irradiation: Secondary | ICD-10-CM | POA: Insufficient documentation

## 2023-08-01 DIAGNOSIS — R519 Headache, unspecified: Secondary | ICD-10-CM | POA: Insufficient documentation

## 2023-08-01 DIAGNOSIS — Z79899 Other long term (current) drug therapy: Secondary | ICD-10-CM | POA: Insufficient documentation

## 2023-08-01 LAB — CBC WITH DIFFERENTIAL (CANCER CENTER ONLY)
Abs Immature Granulocytes: 0.01 10*3/uL (ref 0.00–0.07)
Basophils Absolute: 0 10*3/uL (ref 0.0–0.1)
Basophils Relative: 1 %
Eosinophils Absolute: 0.2 10*3/uL (ref 0.0–0.5)
Eosinophils Relative: 4 %
HCT: 37 % (ref 36.0–46.0)
Hemoglobin: 12.1 g/dL (ref 12.0–15.0)
Immature Granulocytes: 0 %
Lymphocytes Relative: 36 %
Lymphs Abs: 2 10*3/uL (ref 0.7–4.0)
MCH: 27.9 pg (ref 26.0–34.0)
MCHC: 32.7 g/dL (ref 30.0–36.0)
MCV: 85.5 fL (ref 80.0–100.0)
Monocytes Absolute: 0.5 10*3/uL (ref 0.1–1.0)
Monocytes Relative: 8 %
Neutro Abs: 2.8 10*3/uL (ref 1.7–7.7)
Neutrophils Relative %: 51 %
Platelet Count: 211 10*3/uL (ref 150–400)
RBC: 4.33 MIL/uL (ref 3.87–5.11)
RDW: 16.1 % — ABNORMAL HIGH (ref 11.5–15.5)
WBC Count: 5.5 10*3/uL (ref 4.0–10.5)
nRBC: 0 % (ref 0.0–0.2)

## 2023-08-01 LAB — URINALYSIS, COMPLETE (UACMP) WITH MICROSCOPIC
Bacteria, UA: NONE SEEN
Bilirubin Urine: NEGATIVE
Glucose, UA: NEGATIVE mg/dL
Hgb urine dipstick: NEGATIVE
Ketones, ur: NEGATIVE mg/dL
Leukocytes,Ua: NEGATIVE
Nitrite: NEGATIVE
Protein, ur: NEGATIVE mg/dL
Specific Gravity, Urine: 1.014 (ref 1.005–1.030)
pH: 5 (ref 5.0–8.0)

## 2023-08-01 LAB — CMP (CANCER CENTER ONLY)
ALT: 27 U/L (ref 0–44)
AST: 21 U/L (ref 15–41)
Albumin: 4 g/dL (ref 3.5–5.0)
Alkaline Phosphatase: 84 U/L (ref 38–126)
Anion gap: 6 (ref 5–15)
BUN: 14 mg/dL (ref 6–20)
CO2: 26 mmol/L (ref 22–32)
Calcium: 8.6 mg/dL — ABNORMAL LOW (ref 8.9–10.3)
Chloride: 106 mmol/L (ref 98–111)
Creatinine: 0.67 mg/dL (ref 0.44–1.00)
GFR, Estimated: >60 mL/min (ref >60)
Glucose, Bld: 110 mg/dL — ABNORMAL HIGH (ref 70–99)
Potassium: 3.1 mmol/L — ABNORMAL LOW (ref 3.5–5.1)
Sodium: 138 mmol/L (ref 135–145)
Total Bilirubin: 0.4 mg/dL (ref 0.3–1.2)
Total Protein: 6.5 g/dL (ref 6.5–8.1)

## 2023-08-01 LAB — PREGNANCY, URINE: Preg Test, Ur: NEGATIVE

## 2023-08-01 MED ORDER — SODIUM CHLORIDE 0.9 % IV SOLN
420.0000 mg | Freq: Once | INTRAVENOUS | Status: AC
  Administered 2023-08-01: 420 mg via INTRAVENOUS
  Filled 2023-08-01: qty 14

## 2023-08-01 MED ORDER — SODIUM CHLORIDE 0.9 % IV SOLN: Freq: Once | INTRAVENOUS | Status: AC

## 2023-08-01 MED ORDER — ACETAMINOPHEN 325 MG PO TABS
650.0000 mg | ORAL_TABLET | Freq: Once | ORAL | Status: AC
  Administered 2023-08-01: 650 mg via ORAL
  Filled 2023-08-01: qty 2

## 2023-08-01 MED ORDER — TRASTUZUMAB-ANNS CHEMO 150 MG IV SOLR
483.0000 mg | Freq: Once | INTRAVENOUS | Status: AC
  Administered 2023-08-01: 483 mg via INTRAVENOUS
  Filled 2023-08-01: qty 23

## 2023-08-01 MED ORDER — SODIUM CHLORIDE 0.9% FLUSH
10.0000 mL | INTRAVENOUS | Status: DC | PRN
  Administered 2023-08-01: 10 mL

## 2023-08-01 MED ORDER — DIPHENHYDRAMINE HCL 25 MG PO CAPS
50.0000 mg | ORAL_CAPSULE | Freq: Once | ORAL | Status: AC
  Administered 2023-08-01: 50 mg via ORAL
  Filled 2023-08-01: qty 2

## 2023-08-01 MED ORDER — HEPARIN SOD (PORK) LOCK FLUSH 100 UNIT/ML IV SOLN
500.0000 [IU] | Freq: Once | INTRAVENOUS | Status: AC | PRN
  Administered 2023-08-01: 500 [IU]

## 2023-08-01 MED ORDER — SODIUM CHLORIDE 0.9% FLUSH
10.0000 mL | Freq: Once | INTRAVENOUS | Status: AC
  Administered 2023-08-01: 10 mL

## 2023-08-01 NOTE — Progress Notes (Signed)
Patient declined 30 min post pertuzumab observation.  Tolerated treatment well without incident.  VSS at discharge.  Ambulated to lobby.

## 2023-08-01 NOTE — Progress Notes (Signed)
Per Cassie Heilingoetter, PA-C, pt instructed to increase home potassium to one pill twice a day for 5 days, then return to taking 1.5 pills once a day.  Pt verbalized understanding.  PA informed that pt reported difficulty swallowing pills.  RN educated pt on dissolving pills in 1 tsp water and mixing with applesauce to ease swallowing.  Pt verbalized understanding.  RN educated pt on increasing potassium-rich foods, and what these include.  Pt verbalized understanding.

## 2023-08-01 NOTE — Progress Notes (Signed)
Ok to increase Kanjinti dose based on most recent weight.  Joann Meadows [place], [place], BCPS, BCOP 08/01/2023 12:23 PM

## 2023-08-01 NOTE — Patient Instructions (Addendum)
[facility] [facility]  Discharge Instructions: Thank you for choosing [facility] to provide your oncology and hematology care.   If you have a lab appointment with the Cancer Center, please go directly to the Cancer Center and check in at the registration area.   Wear comfortable clothing and clothing appropriate for easy access to any Portacath or PICC line.   We strive to give you quality time with your provider. You may need to reschedule your appointment if you arrive late (15 or more minutes).  Arriving late affects you and other patients whose appointments are after yours.  Also, if you miss three or more appointments without notifying the office, you may be dismissed from the clinic at the provider's discretion.      For prescription refill requests, have your pharmacy contact our office and allow 72 hours for refills to be completed.    Today you received the following chemotherapy and/or immunotherapy agents: Trastuzumab, Pertuzumab      To help prevent nausea and vomiting after your treatment, we encourage you to take your nausea medication as directed.  BELOW ARE SYMPTOMS THAT SHOULD BE REPORTED IMMEDIATELY: *FEVER GREATER THAN 100.4 F (38 C) OR HIGHER *CHILLS OR SWEATING *NAUSEA AND VOMITING THAT IS NOT CONTROLLED WITH YOUR NAUSEA MEDICATION *UNUSUAL SHORTNESS OF BREATH *UNUSUAL BRUISING OR BLEEDING *URINARY PROBLEMS (pain or burning when urinating, or frequent urination) *BOWEL PROBLEMS (unusual diarrhea, constipation, pain near the anus) TENDERNESS IN MOUTH AND THROAT WITH OR WITHOUT PRESENCE OF ULCERS (sore throat, sores in mouth, or a toothache) UNUSUAL RASH, SWELLING OR PAIN  UNUSUAL VAGINAL DISCHARGE OR ITCHING   Items with * indicate a potential emergency and should be followed up as soon as possible or go to the Emergency Department if any problems should occur.  Please show the CHEMOTHERAPY ALERT CARD or IMMUNOTHERAPY ALERT  CARD at check-in to the Emergency Department and triage nurse.  Should you have questions after your visit or need to cancel or reschedule your appointment, please contact [place] [facility]  Dept: [phone]  and follow the prompts.  Office hours are 8:00 a.m. to 4:30 p.m. Monday - Friday. Please note that voicemails left after 4:00 p.m. may not be returned until the following business day.  We are closed weekends and major holidays. You have access to a nurse at all times for urgent questions. Please call the main number to the clinic Dept: [phone] and follow the prompts.   For any non-urgent questions, you may also contact your provider using MyChart. We now offer e-Visits for anyone 65 and older to request care online for non-urgent symptoms. For details visit [email or web address].   Also download the MyChart app! Go to the app store, search "MyChart", open the app, select [place], and log in with your MyChart username and password.  Hypokalemia Hypokalemia means that the amount of potassium in the blood is lower than normal. Potassium is a mineral (electrolyte) that helps regulate the amount of fluid in the body. It also stimulates muscle tightening (contraction) and helps nerves work properly. Normally, most of the body's potassium is inside cells, and only a very small amount is in the blood. Because the amount in the blood is so small, minor changes to potassium levels in the blood can be life-threatening. What are the causes? This condition may be caused by: Antibiotic medicine. Diarrhea or vomiting. Taking too much of a medicine that helps you have  a bowel movement (laxative) can cause diarrhea and lead to hypokalemia. Chronic kidney disease (CKD). Medicines that help the body get rid of excess fluid (diuretics). Eating disorders, such as anorexia or bulimia. Low magnesium levels in the body. Sweating a lot. What are the signs or  symptoms? Symptoms of this condition include: Weakness. Constipation. Fatigue. Muscle cramps. Mental confusion. Skipped heartbeats or irregular heartbeat (palpitations). Tingling or numbness. How is this diagnosed? This condition is diagnosed with a blood test. How is this treated? This condition may be treated by: Taking potassium supplements. Adjusting the medicines that you take. Eating more foods that contain a lot of potassium. If your potassium level is very low, you may need to get potassium through an IV and be monitored in the hospital. Follow these instructions at home: Eating and drinking  Eat a healthy diet. A healthy diet includes fresh fruits and vegetables, whole grains, healthy fats, and lean proteins. If told, eat more foods that contain a lot of potassium. These include: Nuts, such as peanuts and pistachios. Seeds, such as sunflower seeds and pumpkin seeds. Peas, lentils, and lima beans. Whole grain and bran cereals and breads. Fresh fruits and vegetables, such as apricots, avocado, bananas, cantaloupe, kiwi, oranges, tomatoes, asparagus, and potatoes. Juices, such as orange, tomato, and prune. Lean meats, including fish. Milk and milk products, such as yogurt. General instructions Take over-the-counter and prescription medicines only as told by your health care provider. This includes vitamins, natural food products, and supplements. Keep all follow-up visits. This is important. Contact a health care provider if: You have weakness that gets worse. You feel your heart pounding or racing. You vomit. You have diarrhea. You have diabetes and you have trouble keeping your blood sugar in your target range. Get help right away if: You have chest pain. You have shortness of breath. You have vomiting or diarrhea that lasts for more than 2 days. You faint. These symptoms may be an emergency. Get help right away. Call 911. Do not wait to see if the symptoms will  go away. Do not drive yourself to the hospital. Summary Hypokalemia means that the amount of potassium in the blood is lower than normal. This condition is diagnosed with a blood test. Hypokalemia may be treated by taking potassium supplements, adjusting the medicines that you take, or eating more foods that are high in potassium. If your potassium level is very low, you may need to get potassium through an IV and be monitored in the hospital. This information is not intended to replace advice given to you by your health care provider. Make sure you discuss any questions you have with your health care provider. Document Revised: 07/26/2021 Document Reviewed: 07/26/2021 Elsevier Patient Education  2024 [employer].

## 2023-08-02 LAB — URINE CULTURE: Culture: NO GROWTH

## 2023-08-04 ENCOUNTER — Ambulatory Visit
Admission: RE | Admit: 2023-08-04 | Discharge: 2023-08-04 | Disposition: A | Discharge status: Home / Self Care | Payer: BC Managed Care – PPO | Source: Ambulatory Visit | Attending: Internal Medicine | Admitting: Internal Medicine

## 2023-08-04 DIAGNOSIS — D329 Benign neoplasm of meninges, unspecified: Secondary | ICD-10-CM

## 2023-08-04 DIAGNOSIS — D320 Benign neoplasm of cerebral meninges: Secondary | ICD-10-CM | POA: Diagnosis not present

## 2023-08-04 MED ORDER — GADOPICLENOL 0.5 MMOL/ML IV SOLN
8.0000 mL | Freq: Once | INTRAVENOUS | Status: AC | PRN
  Administered 2023-08-04: 8 mL via INTRAVENOUS

## 2023-08-04 NOTE — Addendum Note (Signed)
Addended by: Charma Igo on: 08/04/2023 01:16 PM   Modules accepted: Orders

## 2023-08-05 DIAGNOSIS — Z124 Encounter for screening for malignant neoplasm of cervix: Secondary | ICD-10-CM | POA: Diagnosis not present

## 2023-08-05 DIAGNOSIS — R87612 Low grade squamous intraepithelial lesion on cytologic smear of cervix (LGSIL): Secondary | ICD-10-CM | POA: Diagnosis not present

## 2023-08-05 DIAGNOSIS — N762 Acute vulvitis: Secondary | ICD-10-CM | POA: Diagnosis not present

## 2023-08-07 ENCOUNTER — Inpatient Hospital Stay: Payer: BC Managed Care – PPO | Admitting: Internal Medicine

## 2023-08-07 VITALS — BP 114/70 | HR 71 | Temp 97.5°F | Resp 16 | Wt 173.7 lb

## 2023-08-07 DIAGNOSIS — Z923 Personal history of irradiation: Secondary | ICD-10-CM | POA: Diagnosis not present

## 2023-08-07 DIAGNOSIS — Z87891 Personal history of nicotine dependence: Secondary | ICD-10-CM | POA: Diagnosis not present

## 2023-08-07 DIAGNOSIS — Z5112 Encounter for antineoplastic immunotherapy: Secondary | ICD-10-CM | POA: Diagnosis not present

## 2023-08-07 DIAGNOSIS — D329 Benign neoplasm of meninges, unspecified: Secondary | ICD-10-CM

## 2023-08-07 DIAGNOSIS — R519 Headache, unspecified: Secondary | ICD-10-CM | POA: Diagnosis not present

## 2023-08-07 DIAGNOSIS — C787 Secondary malignant neoplasm of liver and intrahepatic bile duct: Secondary | ICD-10-CM | POA: Diagnosis not present

## 2023-08-07 DIAGNOSIS — Z79899 Other long term (current) drug therapy: Secondary | ICD-10-CM | POA: Diagnosis not present

## 2023-08-07 DIAGNOSIS — D0511 Intraductal carcinoma in situ of right breast: Secondary | ICD-10-CM | POA: Diagnosis not present

## 2023-08-07 NOTE — Progress Notes (Signed)
[facility] [address]  [place], [place] [address] [phone]   Interval Evaluation  Date of Service: 08/07/23 Patient Name: Areiana Taves Patient MRN: [id number] Patient DOB: 02/28/1976 Provider: Henreitta Leber, MD  Identifying Statement:  Atarah Grunder is a 47 y.o. female with Meningioma [facility])   Primary Cancer:  Oncologic History: Oncology History  Breast cancer metastasized to liver (HCC)  01/25/2019 Initial Diagnosis   Screening mammogram showed bilateral calcifications right breast 5 mm, pleomorphic.  Left breast 1.6 cm punctate, smaller group of calcifications spanning 3 mm.  Right lower outer quadrant biopsy revealed low-grade DCIS ER 90%, PR 70%; biopsy of left breast calcifications ALH and PASH   01/31/2019 Genetic Testing   ATM c.8495G>A and RECQL4 c.2587G>A VUS identified on the 9-gene STAT panel and Multi-cancer panel through [facility].  The STAT Breast cancer panel offered by [facility] includes sequencing and rearrangement analysis for the following 9 genes:  ATM, BRCA1, BRCA2, CDH1, CHEK2, PALB2, PTEN, STK11 and TP53.   The Multi-Gene Panel offered by [facility] includes sequencing and/or deletion duplication testing of the following 84 genes: AIP, ALK, APC, ATM, AXIN2,BAP1,  BARD1, BLM, BMPR1A, BRCA1, BRCA2, BRIP1, CASR, CDC73, CDH1, CDK4, CDKN1B, CDKN1C, CDKN2A (p14ARF), CDKN2A (p16INK4a), CEBPA, CHEK2, CTNNA1, DICER1, DIS3L2, EGFR (c.2369C>T, p.Thr790Met variant only), EPCAM (Deletion/duplication testing only), FH, FLCN, GATA2, GPC3, GREM1 (Promoter region deletion/duplication testing only), HOXB13 (c.251G>A, p.Gly84Glu), HRAS, KIT, MAX, MEN1, MET, MITF (c.952G>A, p.Glu318Lys variant only), MLH1, MSH2, MSH3, MSH6, MUTYH, NBN, NF1, NF2, NTHL1, PALB2, PDGFRA, PHOX2B, PMS2, POLD1, POLE, POT1, PRKAR1A, PTCH1, PTEN, RAD50, RAD51C, RAD51D, RB1, RECQL4, RET, RUNX1, SDHAF2, SDHA (sequence changes only), SDHB, SDHC, SDHD, SMAD4, SMARCA4, SMARCB1,  SMARCE1, STK11, SUFU, TERC, TERT, TMEM127, TP53, TSC1, TSC2, VHL, WRN and WT1.  he report date is February 02, 2019.   02/11/2019 Surgery   RT lumpectomy: DICS Intermediate grade, Margins Neg, ER 90%, PR 70%; Lt Lumpectomy: UDH   02/26/2019 -  Anti-estrogen oral therapy   Tamoxifen 20mg  daily, stopped because of depression and facial numbness   05/20/2019 - 06/17/2019 Radiation Therapy   Adjuvant XRT   08/15/2021 Relapse/Recurrence   MRI surveillance detected Right breast calcifications 5 cm, Ant biopsy: IG DCIS with necrosis, ER 40%, PR 0%, Posterior biopsy: Intermediate grade DCIS with [facility]   08/19/2022 Cancer Staging   Staging form: Breast, AJCC 8th Edition - Clinical: Stage IV (cT32mi, cNX, cM1, G3, ER: Unknown, PR: Unknown, HER2: Unknown) - Signed by Serena Croissant, MD on 02/06/2023 Histologic grading system: 3 grade system   02/14/2023 -  Chemotherapy   Patient is on Treatment Plan : BREAST DOCEtaxel + Trastuzumab + Pertuzumab (THP) q21d x 8 cycles / Trastuzumab + Pertuzumab q21d x 4 cycles     AML (acute myeloid leukemia) (HCC)  09/1998 Initial Diagnosis   AML (acute myeloid leukemia) (HCC) treated with 2 induction regimens followed by 6 consolidation regimens, remission     Interval History: Dorys Steinert presents today for follow up after recent MRI brain.  No new or progressive neurologic complaints.  Headaches are improved but not completely resolved.  Continues on Kanjinti with Dr. Pamelia Hoit.  H+P (01/30/23) Patient presents to review recent MRI brain findings.  She describes 1-2 months history of headaches, described as modest pressure on "sides and sometimes top of head".  It is not associated with any nausea, photophobia.  She does have more remote headache history.  Sleep has been very poor, just 2-3 hours per night.  She attributes  this to increased stress surrounding her progressive breast cancer.  Has systemic therapy planned with Dr. Pamelia Hoit.  Medications: Current Outpatient  Medications on File Prior to Visit  Medication Sig Dispense Refill   acetaminophen (TYLENOL) 325 MG tablet Take 650 mg by mouth every 6 (six) hours as needed.     ALPRAZolam (XANAX) 0.25 MG tablet 1 tablet Orally once a day if needed for anxiety/panic (not a daily medication, avoid regular use) for 30 days     cholecalciferol (VITAMIN D3) 25 MCG (1000 UNIT) tablet Take 2,000 Units by mouth daily.     DEXAMETHASONE PO Take by mouth. Taking day prior to treatment and day after treatment     fluconazole (DIFLUCAN) 100 MG tablet Take 2 tablets today then 1 tablet daily for the next 2 days 4 tablet 0   fluticasone (FLONASE) 50 MCG/ACT nasal spray Place 1 spray into both nostrils daily.     Ibuprofen (ADVIL) 200 MG CAPS Take by mouth.     KLOR-CON M20 20 MEQ tablet TAKE 1 TABLET BY MOUTH EVERY DAY 90 tablet 1   olmesartan (BENICAR) 5 MG tablet Take 10 mg by mouth daily.     oxyCODONE (ROXICODONE) 5 MG immediate release tablet Take 1 tablet (5 mg total) by mouth every 4 (four) hours as needed for severe pain. 15 tablet 0   Zinc-Vitamin C (ZINC-A-COLD/VITAMIN C MT) Use as directed 1 capsule in the mouth or throat daily.     No current facility-administered medications on file prior to visit.    Allergies:  Allergies  Allergen Reactions   Levaquin [Levofloxacin] Swelling    Swelling feet and hands   Tamoxifen Citrate     Other reaction(s): depression, joint pain   Tape Rash   Past Medical History:  Past Medical History:  Diagnosis Date   Breast cancer (HCC) 2020   Right Breast Cancer   Endometriosis    Fibroid    Hx of radiation therapy 06/2019   Leukemia (HCC)    20 years ago   Personal history of radiation therapy 2020   Right Breast Cancer   PONV (postoperative nausea and vomiting)    pt states she vomits after colonoscopies   Past Surgical History:  Past Surgical History:  Procedure Laterality Date   BREAST EXCISIONAL BIOPSY Left 02/11/2019   [facility]   BREAST LUMPECTOMY Right  02/11/2019   BREAST LUMPECTOMY WITH RADIOACTIVE SEED LOCALIZATION Bilateral 02/11/2019   Procedure: BILATERAL BREAST LUMPECTOMIES WITH BILATERAL RADIOACTIVE SEED LOCALIZATION;  Surgeon: Manus Rudd, MD;  Location: [facility];  Service: General;  Laterality: Bilateral;   IR IMAGING GUIDED PORT INSERTION  02/25/2023   IR RADIOLOGIST EVAL & MGMT  02/27/2023   MASTECTOMY Right 11/2021   Social History:  Social History   Socioeconomic History   Marital status: Single    Spouse name: Not on file   Number of children: 2   Years of education: Not on file   Highest education level: High school graduate  Occupational History   Not on file  Tobacco Use   Smoking status: Former    Current packs/day: 0.00    Types: Cigarettes    Start date: 01/24/2005    Quit date: 01/25/2015    Years since quitting: 8.5   Smokeless tobacco: Never  Vaping Use   Vaping status: Never Used  Substance and Sexual Activity   Alcohol use: No   Drug use: No   Sexual activity: Yes    Birth control/protection: None  Other Topics Concern   Not on file  Social History Narrative   Lives with children   Social Determinants of Health   Financial Resource Strain: Not on file  Food Insecurity: No Food Insecurity (10/03/2022)   Hunger Vital Sign    Worried About Running Out of Food in the Last Year: Never true    Ran Out of Food in the Last Year: Never true  Transportation Needs: No Transportation Needs (10/03/2022)   PRAPARE - Administrator, Civil Service (Medical): No    Lack of Transportation (Non-Medical): No  Physical Activity: Not on file  Stress: Not on file  Social Connections: Not on file  Intimate Partner Violence: Not At Risk (02/26/2019)   Humiliation, Afraid, Rape, and Kick questionnaire    Fear of Current or Ex-Partner: No    Emotionally Abused: No    Physically Abused: No    Sexually Abused: No   Family History:  Family History  Problem Relation Age of Onset   Colon  cancer Mother 45       d. 64   Hypertension Father    Diabetes Maternal Aunt    Diabetes Maternal Uncle    Mental retardation Maternal Uncle    Diabetes Paternal Aunt    Diabetes Maternal Grandmother    Stroke Paternal Grandmother        c. 41   Leukemia Paternal Grandfather        d. 18; ?CLL   Leukemia Cousin 4       mat first cousin   Lymphoma Cousin 40       pat first cousin    Review of Systems: Constitutional: Doesn't report fevers, chills or abnormal weight loss Eyes: Doesn't report blurriness of vision Ears, nose, mouth, throat, and face: Doesn't report sore throat Respiratory: Doesn't report cough, dyspnea or wheezes Cardiovascular: Doesn't report palpitation, chest discomfort  Gastrointestinal:  Doesn't report nausea, constipation, diarrhea GU: Doesn't report incontinence Skin: Doesn't report skin rashes Neurological: Per HPI Musculoskeletal: Doesn't report joint pain Behavioral/Psych: Doesn't report anxiety  Physical Exam: Vitals:   08/07/23 1205  BP: 114/70  Pulse: 71  Resp: 16  Temp: (!) 97.5 F (36.4 C)  SpO2: 98%    KPS: 90. General: Alert, cooperative, pleasant, in no acute distress Head: Normal EENT: No conjunctival injection or scleral icterus.  Lungs: Resp effort normal Cardiac: Regular rate Abdomen: Non-distended abdomen Skin: No rashes cyanosis or petechiae. Extremities: No clubbing or edema  Neurologic Exam: Mental Status: Awake, alert, attentive to examiner. Oriented to self and environment. Language is fluent with intact comprehension.  Cranial Nerves: Visual acuity is grossly normal. Visual fields are full. Extra-ocular movements intact. No ptosis. Face is symmetric Motor: Tone and bulk are normal. Power is full in both arms and legs. Reflexes are symmetric, no pathologic reflexes present.  Sensory: Intact to light touch Gait: Normal.   Labs: I have reviewed the data as listed    Component Value Date/Time   NA 138 08/01/2023  1047   K 3.1 (L) 08/01/2023 1047   CL 106 08/01/2023 1047   CO2 26 08/01/2023 1047   GLUCOSE 110 (H) 08/01/2023 1047   BUN 14 08/01/2023 1047   CREATININE 0.67 08/01/2023 1047   CALCIUM 8.6 (L) 08/01/2023 1047   PROT 6.5 08/01/2023 1047   ALBUMIN 4.0 08/01/2023 1047   AST 21 08/01/2023 1047   ALT 27 08/01/2023 1047   ALKPHOS 84 08/01/2023 1047   BILITOT 0.4 08/01/2023 1047  GFRNONAA >60 08/01/2023 1047   GFRAA >60 10/27/2019 1528   Lab Results  Component Value Date   WBC 5.5 08/01/2023   NEUTROABS 2.8 08/01/2023   HGB 12.1 08/01/2023   HCT 37.0 08/01/2023   MCV 85.5 08/01/2023   PLT 211 08/01/2023    Imaging:  No results found.  [facility] Clinician Interpretation: I have personally reviewed the radiological images as listed.  My interpretation, in the context of the patient's clinical presentation, is stable disease pending official rad.   Assessment/Plan Meningioma (HCC)  Bryauna Aldea presents with clinical and radiographic syndrome consistent with skull base meningioma.  Current MRI study demonstrates stable findings, pending official read.  We can compare this lesion to an axial FLAIR image from 2020 (scan done for left sided numbness); it was present at that time, dimension has increased by 1-83mm over the 3+ years.    Headaches are better controlled.  We ask that Aislynne Aakre return to clinic in 12 months following next brain MRI, or sooner as needed.  We appreciate the opportunity to participate in the care of Mahli Coonrod.   All questions were answered. The patient knows to call the clinic with any problems, questions or concerns. No barriers to learning were detected.  The total time spent in the encounter was 40 minutes and more than 50% was on counseling and review of test results   Henreitta Leber, MD Medical Director of Neuro-Oncology [facility] at [facility] 08/07/23 12:02 PM

## 2023-08-11 ENCOUNTER — Telehealth: Payer: Self-pay

## 2023-08-11 NOTE — Telephone Encounter (Signed)
Pt called and LVM regarding an ear infection.  Returned pts call and informed her to contact her PCP or urgent care for symptoms.  Pt verbalized understanding.

## 2023-08-12 ENCOUNTER — Telehealth: Payer: Self-pay

## 2023-08-12 DIAGNOSIS — R1110 Vomiting, unspecified: Secondary | ICD-10-CM | POA: Diagnosis not present

## 2023-08-12 DIAGNOSIS — H6690 Otitis media, unspecified, unspecified ear: Secondary | ICD-10-CM | POA: Diagnosis not present

## 2023-08-12 DIAGNOSIS — H9209 Otalgia, unspecified ear: Secondary | ICD-10-CM | POA: Diagnosis not present

## 2023-08-12 NOTE — Telephone Encounter (Signed)
Pt called and states she has ear pain and saw her PCP for that today. She reports she has a perforated ear drum and was placed on abx. She asks why she was not able to be seen by our office yesterday. Advised pt our immediate appointments are designated for our pts who are undergoing chemotherapy and have oncological concerns that need immediate attention. She was advised to see urgent care in the future if she should have this concern again.

## 2023-08-16 ENCOUNTER — Other Ambulatory Visit: Payer: Self-pay

## 2023-08-18 DIAGNOSIS — Z9011 Acquired absence of right breast and nipple: Secondary | ICD-10-CM | POA: Diagnosis not present

## 2023-08-18 DIAGNOSIS — D0511 Intraductal carcinoma in situ of right breast: Secondary | ICD-10-CM | POA: Diagnosis not present

## 2023-08-22 ENCOUNTER — Other Ambulatory Visit: Payer: Self-pay

## 2023-08-22 ENCOUNTER — Ambulatory Visit: Payer: BC Managed Care – PPO | Admitting: Hematology and Oncology

## 2023-08-22 ENCOUNTER — Encounter: Payer: Self-pay | Admitting: Hematology and Oncology

## 2023-08-22 ENCOUNTER — Inpatient Hospital Stay: Payer: BC Managed Care – PPO

## 2023-08-22 VITALS — BP 137/98 | HR 72 | Temp 98.0°F | Resp 18 | Wt 175.8 lb

## 2023-08-22 DIAGNOSIS — Z5112 Encounter for antineoplastic immunotherapy: Secondary | ICD-10-CM | POA: Diagnosis not present

## 2023-08-22 DIAGNOSIS — Z87891 Personal history of nicotine dependence: Secondary | ICD-10-CM | POA: Diagnosis not present

## 2023-08-22 DIAGNOSIS — D0511 Intraductal carcinoma in situ of right breast: Secondary | ICD-10-CM | POA: Diagnosis not present

## 2023-08-22 DIAGNOSIS — Z923 Personal history of irradiation: Secondary | ICD-10-CM | POA: Diagnosis not present

## 2023-08-22 DIAGNOSIS — Z95828 Presence of other vascular implants and grafts: Secondary | ICD-10-CM

## 2023-08-22 DIAGNOSIS — K769 Liver disease, unspecified: Secondary | ICD-10-CM

## 2023-08-22 DIAGNOSIS — C50919 Malignant neoplasm of unspecified site of unspecified female breast: Secondary | ICD-10-CM

## 2023-08-22 DIAGNOSIS — Z79899 Other long term (current) drug therapy: Secondary | ICD-10-CM | POA: Diagnosis not present

## 2023-08-22 DIAGNOSIS — C787 Secondary malignant neoplasm of liver and intrahepatic bile duct: Secondary | ICD-10-CM | POA: Diagnosis not present

## 2023-08-22 DIAGNOSIS — D329 Benign neoplasm of meninges, unspecified: Secondary | ICD-10-CM | POA: Diagnosis not present

## 2023-08-22 DIAGNOSIS — R519 Headache, unspecified: Secondary | ICD-10-CM | POA: Diagnosis not present

## 2023-08-22 LAB — CMP (CANCER CENTER ONLY)
ALT: 16 U/L (ref 0–44)
AST: 15 U/L (ref 15–41)
Albumin: 4 g/dL (ref 3.5–5.0)
Alkaline Phosphatase: 100 U/L (ref 38–126)
Anion gap: 5 (ref 5–15)
BUN: 16 mg/dL (ref 6–20)
CO2: 27 mmol/L (ref 22–32)
Calcium: 9.1 mg/dL (ref 8.9–10.3)
Chloride: 108 mmol/L (ref 98–111)
Creatinine: 0.69 mg/dL (ref 0.44–1.00)
GFR, Estimated: >60 mL/min (ref >60)
Glucose, Bld: 120 mg/dL — ABNORMAL HIGH (ref 70–99)
Potassium: 3.7 mmol/L (ref 3.5–5.1)
Sodium: 140 mmol/L (ref 135–145)
Total Bilirubin: 0.3 mg/dL (ref 0.3–1.2)
Total Protein: 6.9 g/dL (ref 6.5–8.1)

## 2023-08-22 LAB — CBC WITH DIFFERENTIAL (CANCER CENTER ONLY)
Abs Immature Granulocytes: 0.02 10*3/uL (ref 0.00–0.07)
Basophils Absolute: 0.1 10*3/uL (ref 0.0–0.1)
Basophils Relative: 1 %
Eosinophils Absolute: 0.2 10*3/uL (ref 0.0–0.5)
Eosinophils Relative: 4 %
HCT: 39 % (ref 36.0–46.0)
Hemoglobin: 12.7 g/dL (ref 12.0–15.0)
Immature Granulocytes: 0 %
Lymphocytes Relative: 32 %
Lymphs Abs: 1.7 10*3/uL (ref 0.7–4.0)
MCH: 28 pg (ref 26.0–34.0)
MCHC: 32.6 g/dL (ref 30.0–36.0)
MCV: 85.9 fL (ref 80.0–100.0)
Monocytes Absolute: 0.4 10*3/uL (ref 0.1–1.0)
Monocytes Relative: 7 %
Neutro Abs: 2.9 10*3/uL (ref 1.7–7.7)
Neutrophils Relative %: 56 %
Platelet Count: 272 10*3/uL (ref 150–400)
RBC: 4.54 MIL/uL (ref 3.87–5.11)
RDW: 15.3 % (ref 11.5–15.5)
WBC Count: 5.2 10*3/uL (ref 4.0–10.5)
nRBC: 0 % (ref 0.0–0.2)

## 2023-08-22 LAB — PREGNANCY, URINE: Preg Test, Ur: NEGATIVE

## 2023-08-22 MED ORDER — HEPARIN SOD (PORK) LOCK FLUSH 100 UNIT/ML IV SOLN
500.0000 [IU] | Freq: Once | INTRAVENOUS | Status: AC | PRN
  Administered 2023-08-22: 500 [IU]

## 2023-08-22 MED ORDER — SODIUM CHLORIDE 0.9 % IV SOLN
420.0000 mg | Freq: Once | INTRAVENOUS | Status: AC
  Administered 2023-08-22: 420 mg via INTRAVENOUS
  Filled 2023-08-22: qty 14

## 2023-08-22 MED ORDER — DIPHENHYDRAMINE HCL 25 MG PO CAPS
50.0000 mg | ORAL_CAPSULE | Freq: Once | ORAL | Status: AC
  Administered 2023-08-22: 50 mg via ORAL
  Filled 2023-08-22: qty 2

## 2023-08-22 MED ORDER — SODIUM CHLORIDE 0.9% FLUSH
10.0000 mL | INTRAVENOUS | Status: DC | PRN
  Administered 2023-08-22: 10 mL

## 2023-08-22 MED ORDER — SODIUM CHLORIDE 0.9% FLUSH
10.0000 mL | Freq: Once | INTRAVENOUS | Status: AC
  Administered 2023-08-22: 10 mL

## 2023-08-22 MED ORDER — TRASTUZUMAB-ANNS CHEMO 150 MG IV SOLR
6.0000 mg/kg | Freq: Once | INTRAVENOUS | Status: AC
  Administered 2023-08-22: 483 mg via INTRAVENOUS
  Filled 2023-08-22: qty 23

## 2023-08-22 MED ORDER — ACETAMINOPHEN 325 MG PO TABS
650.0000 mg | ORAL_TABLET | Freq: Once | ORAL | Status: AC
  Administered 2023-08-22: 650 mg via ORAL
  Filled 2023-08-22: qty 2

## 2023-08-22 MED ORDER — SODIUM CHLORIDE 0.9 % IV SOLN: Freq: Once | INTRAVENOUS | Status: AC

## 2023-08-22 NOTE — Addendum Note (Signed)
Addended by: Hazle Quant on: 08/22/2023 10:21 AM   Modules accepted: Orders

## 2023-08-22 NOTE — Patient Instructions (Signed)
[place] [facility]  Discharge Instructions: Thank you for choosing [facility] to provide your oncology and hematology care.   If you have a lab appointment with the Cancer Center, please go directly to the Cancer Center and check in at the registration area.   Wear comfortable clothing and clothing appropriate for easy access to any Portacath or PICC line.   We strive to give you quality time with your provider. You may need to reschedule your appointment if you arrive late (15 or more minutes).  Arriving late affects you and other patients whose appointments are after yours.  Also, if you miss three or more appointments without notifying the office, you may be dismissed from the clinic at the provider's discretion.      For prescription refill requests, have your pharmacy contact our office and allow 72 hours for refills to be completed.    Today you received the following chemotherapy and/or immunotherapy agents: Kanjinti, Pertuzumab.       To help prevent nausea and vomiting after your treatment, we encourage you to take your nausea medication as directed.  BELOW ARE SYMPTOMS THAT SHOULD BE REPORTED IMMEDIATELY: *FEVER GREATER THAN 100.4 F (38 C) OR HIGHER *CHILLS OR SWEATING *NAUSEA AND VOMITING THAT IS NOT CONTROLLED WITH YOUR NAUSEA MEDICATION *UNUSUAL SHORTNESS OF BREATH *UNUSUAL BRUISING OR BLEEDING *URINARY PROBLEMS (pain or burning when urinating, or frequent urination) *BOWEL PROBLEMS (unusual diarrhea, constipation, pain near the anus) TENDERNESS IN MOUTH AND THROAT WITH OR WITHOUT PRESENCE OF ULCERS (sore throat, sores in mouth, or a toothache) UNUSUAL RASH, SWELLING OR PAIN  UNUSUAL VAGINAL DISCHARGE OR ITCHING   Items with * indicate a potential emergency and should be followed up as soon as possible or go to the Emergency Department if any problems should occur.  Please show the CHEMOTHERAPY ALERT CARD or IMMUNOTHERAPY ALERT  CARD at check-in to the Emergency Department and triage nurse.  Should you have questions after your visit or need to cancel or reschedule your appointment, please contact [place] [facility]  Dept: [phone]  and follow the prompts.  Office hours are 8:00 a.m. to 4:30 p.m. Monday - Friday. Please note that voicemails left after 4:00 p.m. may not be returned until the following business day.  We are closed weekends and major holidays. You have access to a nurse at all times for urgent questions. Please call the main number to the clinic Dept: [phone] and follow the prompts.   For any non-urgent questions, you may also contact your provider using MyChart. We now offer e-Visits for anyone 18 and older to request care online for non-urgent symptoms. For details visit [email or web address].   Also download the MyChart app! Go to the app store, search "MyChart", open the app, select [place], and log in with your MyChart username and password.   

## 2023-09-03 ENCOUNTER — Ambulatory Visit (INDEPENDENT_AMBULATORY_CARE_PROVIDER_SITE_OTHER): Payer: BC Managed Care – PPO | Admitting: Otolaryngology

## 2023-09-03 ENCOUNTER — Encounter (INDEPENDENT_AMBULATORY_CARE_PROVIDER_SITE_OTHER): Payer: Self-pay | Admitting: Otolaryngology

## 2023-09-03 VITALS — Ht 65.0 in | Wt 176.6 lb

## 2023-09-03 DIAGNOSIS — H6983 Other specified disorders of Eustachian tube, bilateral: Secondary | ICD-10-CM

## 2023-09-03 DIAGNOSIS — H60331 Swimmer's ear, right ear: Secondary | ICD-10-CM | POA: Insufficient documentation

## 2023-09-03 DIAGNOSIS — J310 Chronic rhinitis: Secondary | ICD-10-CM

## 2023-09-03 NOTE — Progress Notes (Signed)
Patient ID: Joann Meadows, female   DOB: February 17, 1976, 47 y.o.   MRN: [id number]  Cc: Recurrent right ear infections  HPI: The patient is a 47 year old female who returns today for her follow-up evaluation.  The patient has a history of recurrent ear infections, eustachian tube dysfunction, and chronic rhinitis.  She has a history of breast cancer, and is currently being treated with immunotherapy.  As a result, she is immunocompromise.  She has frequent recurrent right ear infections.  At her last visit in August 2024, her acute infection had resolved.  She was continued on her Flonase nasal spray.  The patient returns today complaining of an episode of right ear infection 1 week ago.  She was treated with Augmentin.  Currently she denies any significant otalgia or otorrhea.  Exam: General: Communicates without difficulty, well nourished, no acute distress. Head: Normocephalic, no evidence injury, no tenderness, facial buttresses intact without stepoff. Face/sinus: No tenderness to palpation and percussion. Facial movement is normal and symmetric. Eyes: PERRL, EOMI. No scleral icterus, conjunctivae clear. Neuro: CN II exam reveals vision grossly intact.  No nystagmus at any point of gaze. Ears: Auricles well formed without lesions.  Ear canals are intact without mass or lesion.  No erythema or edema is appreciated.  The TMs are intact without fluid. Nose: External evaluation reveals normal support and skin without lesions.  Dorsum is intact.  Anterior rhinoscopy reveals congested mucosa over anterior aspect of inferior turbinates and intact septum.  No purulence noted. Oral:  Oral cavity and oropharynx are intact, symmetric, without erythema or edema.  Mucosa is moist without lesions. Neck: Full range of motion without pain.  There is no significant lymphadenopathy.  No masses palpable.  Thyroid bed within normal limits to palpation.  Parotid glands and submandibular glands equal bilaterally without mass.   Trachea is midline. Neuro:  CN 2-12 grossly intact.   Assessment: 1.  The patient's ear canals, tympanic membranes, and middle ear spaces are noted to be normal.  Her recent right ear infection has resolved. 2.  Chronic rhinitis with nasal mucosal congestion and eustachian tube dysfunction.  Plan: 1.  The physical exam findings are reviewed with the patient. 2.  Continue with Flonase nasal spray and Claritin daily. 3.  Prophylactic Ciprodex eardrops weekly. 4.  The patient is encouraged to call with any questions or concerns.

## 2023-09-05 ENCOUNTER — Other Ambulatory Visit: Payer: Self-pay | Admitting: Hematology and Oncology

## 2023-09-05 DIAGNOSIS — J310 Chronic rhinitis: Secondary | ICD-10-CM | POA: Insufficient documentation

## 2023-09-05 DIAGNOSIS — H6983 Other specified disorders of Eustachian tube, bilateral: Secondary | ICD-10-CM | POA: Insufficient documentation

## 2023-09-05 DIAGNOSIS — H6981 Other specified disorders of Eustachian tube, right ear: Secondary | ICD-10-CM | POA: Insufficient documentation

## 2023-09-05 DIAGNOSIS — C50919 Malignant neoplasm of unspecified site of unspecified female breast: Secondary | ICD-10-CM

## 2023-09-12 ENCOUNTER — Telehealth: Payer: Self-pay | Admitting: *Deleted

## 2023-09-12 ENCOUNTER — Inpatient Hospital Stay: Payer: BC Managed Care – PPO

## 2023-09-12 ENCOUNTER — Encounter: Payer: Self-pay | Admitting: *Deleted

## 2023-09-12 ENCOUNTER — Inpatient Hospital Stay: Payer: BC Managed Care – PPO | Attending: Hematology and Oncology | Admitting: Adult Health

## 2023-09-12 ENCOUNTER — Encounter: Payer: Self-pay | Admitting: Adult Health

## 2023-09-12 ENCOUNTER — Other Ambulatory Visit: Payer: Self-pay | Admitting: *Deleted

## 2023-09-12 VITALS — BP 149/98 | HR 75 | Temp 97.4°F | Resp 18 | Ht 65.0 in | Wt 174.7 lb

## 2023-09-12 DIAGNOSIS — C50911 Malignant neoplasm of unspecified site of right female breast: Secondary | ICD-10-CM | POA: Insufficient documentation

## 2023-09-12 DIAGNOSIS — Z87891 Personal history of nicotine dependence: Secondary | ICD-10-CM | POA: Diagnosis not present

## 2023-09-12 DIAGNOSIS — Z5112 Encounter for antineoplastic immunotherapy: Secondary | ICD-10-CM | POA: Diagnosis not present

## 2023-09-12 DIAGNOSIS — C50919 Malignant neoplasm of unspecified site of unspecified female breast: Secondary | ICD-10-CM | POA: Diagnosis not present

## 2023-09-12 DIAGNOSIS — Z170 Estrogen receptor positive status [ER+]: Secondary | ICD-10-CM | POA: Diagnosis not present

## 2023-09-12 DIAGNOSIS — C787 Secondary malignant neoplasm of liver and intrahepatic bile duct: Secondary | ICD-10-CM | POA: Insufficient documentation

## 2023-09-12 DIAGNOSIS — C9200 Acute myeloblastic leukemia, not having achieved remission: Secondary | ICD-10-CM | POA: Insufficient documentation

## 2023-09-12 LAB — CMP (CANCER CENTER ONLY)
ALT: 16 U/L (ref 0–44)
AST: 13 U/L — ABNORMAL LOW (ref 15–41)
Albumin: 4.3 g/dL (ref 3.5–5.0)
Alkaline Phosphatase: 99 U/L (ref 38–126)
Anion gap: 7 (ref 5–15)
BUN: 19 mg/dL (ref 6–20)
CO2: 26 mmol/L (ref 22–32)
Calcium: 9.4 mg/dL (ref 8.9–10.3)
Chloride: 105 mmol/L (ref 98–111)
Creatinine: 0.7 mg/dL (ref 0.44–1.00)
GFR, Estimated: >60 mL/min (ref >60)
Glucose, Bld: 96 mg/dL (ref 70–99)
Potassium: 3.9 mmol/L (ref 3.5–5.1)
Sodium: 138 mmol/L (ref 135–145)
Total Bilirubin: 0.3 mg/dL (ref 0.3–1.2)
Total Protein: 7.1 g/dL (ref 6.5–8.1)

## 2023-09-12 LAB — CBC WITH DIFFERENTIAL (CANCER CENTER ONLY)
Abs Immature Granulocytes: 0.01 10*3/uL (ref 0.00–0.07)
Basophils Absolute: 0.1 10*3/uL (ref 0.0–0.1)
Basophils Relative: 1 %
Eosinophils Absolute: 0.2 10*3/uL (ref 0.0–0.5)
Eosinophils Relative: 4 %
HCT: 39.9 % (ref 36.0–46.0)
Hemoglobin: 13 g/dL (ref 12.0–15.0)
Immature Granulocytes: 0 %
Lymphocytes Relative: 40 %
Lymphs Abs: 2.3 10*3/uL (ref 0.7–4.0)
MCH: 28 pg (ref 26.0–34.0)
MCHC: 32.6 g/dL (ref 30.0–36.0)
MCV: 85.8 fL (ref 80.0–100.0)
Monocytes Absolute: 0.4 10*3/uL (ref 0.1–1.0)
Monocytes Relative: 8 %
Neutro Abs: 2.7 10*3/uL (ref 1.7–7.7)
Neutrophils Relative %: 47 %
Platelet Count: 234 10*3/uL (ref 150–400)
RBC: 4.65 MIL/uL (ref 3.87–5.11)
RDW: 15.1 % (ref 11.5–15.5)
WBC Count: 5.7 10*3/uL (ref 4.0–10.5)
nRBC: 0 % (ref 0.0–0.2)

## 2023-09-12 LAB — PREGNANCY, URINE: Preg Test, Ur: NEGATIVE

## 2023-09-12 MED ORDER — SODIUM CHLORIDE 0.9 % IV SOLN
420.0000 mg | Freq: Once | INTRAVENOUS | Status: AC
  Administered 2023-09-12: 420 mg via INTRAVENOUS
  Filled 2023-09-12: qty 14

## 2023-09-12 MED ORDER — SODIUM CHLORIDE 0.9% FLUSH
10.0000 mL | INTRAVENOUS | Status: DC | PRN
  Administered 2023-09-12: 10 mL

## 2023-09-12 MED ORDER — ACETAMINOPHEN 325 MG PO TABS
650.0000 mg | ORAL_TABLET | Freq: Once | ORAL | Status: AC
  Administered 2023-09-12: 650 mg via ORAL
  Filled 2023-09-12: qty 2

## 2023-09-12 MED ORDER — HEPARIN SOD (PORK) LOCK FLUSH 100 UNIT/ML IV SOLN
500.0000 [IU] | Freq: Once | INTRAVENOUS | Status: AC | PRN
  Administered 2023-09-12: 500 [IU]

## 2023-09-12 MED ORDER — DIPHENHYDRAMINE HCL 25 MG PO CAPS
50.0000 mg | ORAL_CAPSULE | Freq: Once | ORAL | Status: AC
  Administered 2023-09-12: 50 mg via ORAL
  Filled 2023-09-12: qty 2

## 2023-09-12 MED ORDER — TRASTUZUMAB-ANNS CHEMO 150 MG IV SOLR
6.0000 mg/kg | Freq: Once | INTRAVENOUS | Status: AC
  Administered 2023-09-12: 483 mg via INTRAVENOUS
  Filled 2023-09-12: qty 23

## 2023-09-12 MED ORDER — SODIUM CHLORIDE 0.9 % IV SOLN: Freq: Once | INTRAVENOUS | Status: AC

## 2023-09-12 NOTE — Patient Instructions (Signed)
[facility]  Discharge Instructions: Thank you for choosing [facility] to provide your oncology and hematology care.   If you have a lab appointment with the Cancer Center, please go directly to the Cancer Center and check in at the registration area.   Wear comfortable clothing and clothing appropriate for easy access to any Portacath or PICC line.   We strive to give you quality time with your provider. You may need to reschedule your appointment if you arrive late (15 or more minutes).  Arriving late affects you and other patients whose appointments are after yours.  Also, if you miss three or more appointments without notifying the office, you may be dismissed from the clinic at the provider's discretion.      For prescription refill requests, have your pharmacy contact our office and allow 72 hours for refills to be completed.    Today you received the following chemotherapy and/or immunotherapy agents: Kanjinti/Perjeta   To help prevent nausea and vomiting after your treatment, we encourage you to take your nausea medication as directed.  BELOW ARE SYMPTOMS THAT SHOULD BE REPORTED IMMEDIATELY: *FEVER GREATER THAN 100.4 F (38 C) OR HIGHER *CHILLS OR SWEATING *NAUSEA AND VOMITING THAT IS NOT CONTROLLED WITH YOUR NAUSEA MEDICATION *UNUSUAL SHORTNESS OF BREATH *UNUSUAL BRUISING OR BLEEDING *URINARY PROBLEMS (pain or burning when urinating, or frequent urination) *BOWEL PROBLEMS (unusual diarrhea, constipation, pain near the anus) TENDERNESS IN MOUTH AND THROAT WITH OR WITHOUT PRESENCE OF ULCERS (sore throat, sores in mouth, or a toothache) UNUSUAL RASH, SWELLING OR PAIN  UNUSUAL VAGINAL DISCHARGE OR ITCHING   Items with * indicate a potential emergency and should be followed up as soon as possible or go to the Emergency Department if any problems should occur.  Please show the CHEMOTHERAPY ALERT CARD or IMMUNOTHERAPY ALERT CARD at  check-in to the Emergency Department and triage nurse.  Should you have questions after your visit or need to cancel or reschedule your appointment, please contact [facility]  Dept: [phone]  and follow the prompts.  Office hours are 8:00 a.m. to 4:30 p.m. Monday - Friday. Please note that voicemails left after 4:00 p.m. may not be returned until the following business day.  We are closed weekends and major holidays. You have access to a nurse at all times for urgent questions. Please call the main number to the clinic Dept: [phone] and follow the prompts.   For any non-urgent questions, you may also contact your provider using MyChart. We now offer e-Visits for anyone 18 and older to request care online for non-urgent symptoms. For details visit [email or web address].   Also download the MyChart app! Go to the app store, search "MyChart", open the app, select [facility], and log in with your MyChart username and password.   

## 2023-09-12 NOTE — Telephone Encounter (Signed)
-----   Message from Noreene Filbert sent at 09/12/2023  1:53 PM EDT ----- Regarding: FW: Please call patient and notify her of Dr. Suszanne Conners recommendations ----- Message ----- From: Newman Pies, MD Sent: 09/12/2023   1:37 PM EDT To: Loa Socks, NP Subject: RE:                                            In that case, she can stop the Flonase. Have her perform the Valsalva exercise at least 20 times a day. Thank you. ----- Message ----- From: Loa Socks, NP Sent: 09/12/2023   1:27 PM EDT To: Newman Pies, MD  Hey there,  I am reaching out on behalf of of our mutual patient Joann Meadows.  She has been experiencing increased epistaxis with the Flonase.  Is there another nasal spray you would suggest for her considering her chronic ear infections and eustachian tube dysfunction?  Thank so much,  Joann Meadows

## 2023-09-12 NOTE — Progress Notes (Unsigned)
[facility] Cancer Follow up:    Joann Bussing, MD [address] [place] [address]   DIAGNOSIS: Cancer Staging  Breast cancer metastasized to liver [facility]) Staging form: Breast, AJCC 8th Edition - Clinical: Stage IV (cT70mi, cNX, cM1, G3, ER: Unknown, PR: Unknown, HER2: Unknown) - Signed by Serena Croissant, MD on 02/06/2023 Histologic grading system: 3 grade system   SUMMARY OF ONCOLOGIC HISTORY: Oncology History  Breast cancer metastasized to liver (HCC)  01/25/2019 Initial Diagnosis   Screening mammogram showed bilateral calcifications right breast 5 mm, pleomorphic.  Left breast 1.6 cm punctate, smaller group of calcifications spanning 3 mm.  Right lower outer quadrant biopsy revealed low-grade DCIS ER 90%, PR 70%; biopsy of left breast calcifications ALH and PASH   01/31/2019 Genetic Testing   ATM c.8495G>A and RECQL4 c.2587G>A VUS identified on the 9-gene STAT panel and Multi-cancer panel through [facility].  The STAT Breast cancer panel offered by [facility] includes sequencing and rearrangement analysis for the following 9 genes:  ATM, BRCA1, BRCA2, CDH1, CHEK2, PALB2, PTEN, STK11 and TP53.   The Multi-Gene Panel offered by [facility] includes sequencing and/or deletion duplication testing of the following 84 genes: AIP, ALK, APC, ATM, AXIN2,BAP1,  BARD1, BLM, BMPR1A, BRCA1, BRCA2, BRIP1, CASR, CDC73, CDH1, CDK4, CDKN1B, CDKN1C, CDKN2A (p14ARF), CDKN2A (p16INK4a), CEBPA, CHEK2, CTNNA1, DICER1, DIS3L2, EGFR (c.2369C>T, p.Thr790Met variant only), EPCAM (Deletion/duplication testing only), FH, FLCN, GATA2, GPC3, GREM1 (Promoter region deletion/duplication testing only), HOXB13 (c.251G>A, p.Gly84Glu), HRAS, KIT, MAX, MEN1, MET, MITF (c.952G>A, p.Glu318Lys variant only), MLH1, MSH2, MSH3, MSH6, MUTYH, NBN, NF1, NF2, NTHL1, PALB2, PDGFRA, PHOX2B, PMS2, POLD1, POLE, POT1, PRKAR1A, PTCH1, PTEN, RAD50, RAD51C, RAD51D, RB1, RECQL4, RET, RUNX1, SDHAF2, SDHA (sequence changes  only), SDHB, SDHC, SDHD, SMAD4, SMARCA4, SMARCB1, SMARCE1, STK11, SUFU, TERC, TERT, TMEM127, TP53, TSC1, TSC2, VHL, WRN and WT1.  he report date is February 02, 2019.   02/11/2019 Surgery   RT lumpectomy: DICS Intermediate grade, Margins Neg, ER 90%, PR 70%; Lt Lumpectomy: UDH   02/26/2019 -  Anti-estrogen oral therapy   Tamoxifen 20mg  daily, stopped because of depression and facial numbness   05/20/2019 - 06/17/2019 Radiation Therapy   Adjuvant XRT   08/15/2021 Relapse/Recurrence   MRI surveillance detected Right breast calcifications 5 cm, Ant biopsy: IG DCIS with necrosis, ER 40%, PR 0%, Posterior biopsy: Intermediate grade DCIS with [facility]   08/19/2022 Cancer Staging   Staging form: Breast, AJCC 8th Edition - Clinical: Stage IV (cT80mi, cNX, cM1, G3, ER: Unknown, PR: Unknown, HER2: Unknown) - Signed by Serena Croissant, MD on 02/06/2023 Histologic grading system: 3 grade system   02/14/2023 -  Chemotherapy   Patient is on Treatment Plan : BREAST DOCEtaxel + Trastuzumab + Pertuzumab (THP) q21d x 8 cycles / Trastuzumab + Pertuzumab q21d x 4 cycles     AML (acute myeloid leukemia) (HCC)  09/1998 Initial Diagnosis   AML (acute myeloid leukemia) (HCC) treated with 2 induction regimens followed by 6 consolidation regimens, remission     CURRENT THERAPY:  Herceptin/Perjeta  INTERVAL HISTORY: Joann Meadows 47 y.o. female returns for follow-up prior to receiving Herceptin Perjeta today.  Her most recent echocardiogram occurred on June 02, 2023 demonstrating a left ventricular ejection fraction of 55 to 60%.  Her global longitudinal strain is normal.  She saw Dr. Barbaraann Cao on August 07, 2023 for her meningioma.  He reviewed her previous MRI that demonstrated stable findings and noted her headaches were better controlled.  Her next pet is scheduled on 09/29/2023.  Her most recent PET scan on June 12, 2023 demonstrated resolution of hypermetabolic liver lesions and no local recurrence within the  thorax.  Joann Meadows experiences diarrhea first week after receiving chemotherapy.  During that time she manages it with 2 Imodium.  It then resolves.  She notes that if she takes too much Imodium she will experience constipation.  He is also struggling with chronic ear infections.  She is seeing Dr. Jodean Lima and ENT for this.  She notes it is complicated by muffling in her ears and pain.  She had an episode of epistaxis with her Flonase and wonders if there is a different nasal spray to take.  Patient Active Problem List   Diagnosis Date Noted  . Other specified disorders of eustachian tube, bilateral 09/05/2023  . Chronic rhinitis 09/05/2023  . Swimmer's ear of right side 09/03/2023  . Port-A-Cath in place 03/07/2023  . Meningioma (HCC) 01/30/2023  . Liver lesion 01/23/2023  . COPD possible, would be Gold Group B 11/25/2019  . DOE (dyspnea on exertion) 11/24/2019  . Encounter for antineoplastic chemotherapy 02/02/2019  . Breast cancer metastasized to liver (HCC) 01/25/2019  . AML (acute myeloid leukemia) (HCC) 01/25/2019  . Family history of colon cancer   . Family history of leukemia   . Delivery normal 03/25/2017    is allergic to levaquin [levofloxacin], tamoxifen citrate, and tape.  MEDICAL HISTORY: Past Medical History:  Diagnosis Date  . Breast cancer (HCC) 2020   Right Breast Cancer  . Endometriosis   . Fibroid   . Hx of radiation therapy 06/2019  . Leukemia (HCC)    20 years ago  . Personal history of radiation therapy 2020   Right Breast Cancer  . PONV (postoperative nausea and vomiting)    pt states she vomits after colonoscopies    SURGICAL HISTORY: Past Surgical History:  Procedure Laterality Date  . BREAST EXCISIONAL BIOPSY Left 02/11/2019   ALH  . BREAST LUMPECTOMY Right 02/11/2019  . BREAST LUMPECTOMY WITH RADIOACTIVE SEED LOCALIZATION Bilateral 02/11/2019   Procedure: BILATERAL BREAST LUMPECTOMIES WITH BILATERAL RADIOACTIVE SEED LOCALIZATION;  Surgeon: Manus Rudd, MD;  Location: [facility];  Service: General;  Laterality: Bilateral;  . IR IMAGING GUIDED PORT INSERTION  02/25/2023  . IR RADIOLOGIST EVAL & MGMT  02/27/2023  . MASTECTOMY Right 11/2021    SOCIAL HISTORY: Social History   Socioeconomic History  . Marital status: Single    Spouse name: Not on file  . Number of children: 2  . Years of education: Not on file  . Highest education level: High school graduate  Occupational History  . Not on file  Tobacco Use  . Smoking status: Former    Current packs/day: 0.00    Types: Cigarettes    Start date: 01/24/2005    Quit date: 01/25/2015    Years since quitting: 8.6  . Smokeless tobacco: Never  Vaping Use  . Vaping status: Never Used  Substance and Sexual Activity  . Alcohol use: No  . Drug use: No  . Sexual activity: Yes    Birth control/protection: None  Other Topics Concern  . Not on file  Social History Narrative   Lives with children   Social Determinants of Health   Financial Resource Strain: Not on file  Food Insecurity: No Food Insecurity (10/03/2022)   Hunger Vital Sign   . Worried About Programme researcher, broadcasting/film/video in the Last Year: Never true   . Ran Out of Food in the Last  Year: Never true  Transportation Needs: No Transportation Needs (10/03/2022)   PRAPARE - Transportation   . Lack of Transportation (Medical): No   . Lack of Transportation (Non-Medical): No  Physical Activity: Not on file  Stress: Not on file  Social Connections: Not on file  Intimate Partner Violence: Not At Risk (02/26/2019)   Humiliation, Afraid, Rape, and Kick questionnaire   . Fear of Current or Ex-Partner: No   . Emotionally Abused: No   . Physically Abused: No   . Sexually Abused: No    FAMILY HISTORY: Family History  Problem Relation Age of Onset  . Colon cancer Mother 42       d. 58  . Hypertension Father   . Diabetes Maternal Aunt   . Diabetes Maternal Uncle   . Mental retardation Maternal Uncle   . Diabetes Paternal  Aunt   . Diabetes Maternal Grandmother   . Stroke Paternal Grandmother        c. 69  . Leukemia Paternal Grandfather        d. 15; ?CLL  . Leukemia Cousin 4       mat first cousin  . Lymphoma Cousin 40       pat first cousin    Review of Systems  Constitutional:  Negative for appetite change, chills, fatigue, fever and unexpected weight change.  HENT:   Negative for hearing loss, lump/mass and trouble swallowing.   Eyes:  Negative for eye problems and icterus.  Respiratory:  Negative for chest tightness, cough and shortness of breath.   Cardiovascular:  Negative for chest pain, leg swelling and palpitations.  Gastrointestinal:  Negative for abdominal distention, abdominal pain, constipation, diarrhea, nausea and vomiting.  Endocrine: Negative for hot flashes.  Genitourinary:  Negative for difficulty urinating.   Musculoskeletal:  Negative for arthralgias.  Skin:  Negative for itching and rash.  Neurological:  Negative for dizziness, extremity weakness, headaches and numbness.  Hematological:  Negative for adenopathy. Does not bruise/bleed easily.  Psychiatric/Behavioral:  Negative for depression. The patient is not nervous/anxious.       PHYSICAL EXAMINATION    Vitals:   09/12/23 1058  BP: (!) 149/98  Pulse: 75  Resp: 18  Temp: (!) 97.4 F (36.3 C)  SpO2: 100%    Physical Exam Constitutional:      General: She is not in acute distress.    Appearance: Normal appearance. She is not toxic-appearing.  HENT:     Head: Normocephalic and atraumatic.     Mouth/Throat:     Mouth: Mucous membranes are moist.     Pharynx: Oropharynx is clear. No oropharyngeal exudate or posterior oropharyngeal erythema.  Eyes:     General: No scleral icterus. Cardiovascular:     Rate and Rhythm: Normal rate and regular rhythm.     Pulses: Normal pulses.     Heart sounds: Normal heart sounds.  Pulmonary:     Effort: Pulmonary effort is normal.     Breath sounds: Normal breath sounds.   Abdominal:     General: Abdomen is flat. Bowel sounds are normal. There is no distension.     Palpations: Abdomen is soft.     Tenderness: There is no abdominal tenderness.  Musculoskeletal:        General: No swelling.     Cervical back: Neck supple.  Lymphadenopathy:     Cervical: No cervical adenopathy.  Skin:    General: Skin is warm and dry.     Findings: No rash.  Neurological:     General: No focal deficit present.     Mental Status: She is alert.  Psychiatric:        Mood and Affect: Mood normal.        Behavior: Behavior normal.    LABORATORY DATA:  CBC    Component Value Date/Time   WBC 5.7 09/12/2023 1003   WBC 2.7 (L) 02/26/2023 0001   RBC 4.65 09/12/2023 1003   HGB 13.0 09/12/2023 1003   HCT 39.9 09/12/2023 1003   PLT 234 09/12/2023 1003   MCV 85.8 09/12/2023 1003   MCH 28.0 09/12/2023 1003   MCHC 32.6 09/12/2023 1003   RDW 15.1 09/12/2023 1003   LYMPHSABS 2.3 09/12/2023 1003   MONOABS 0.4 09/12/2023 1003   EOSABS 0.2 09/12/2023 1003   BASOSABS 0.1 09/12/2023 1003    CMP     Component Value Date/Time   NA 138 09/12/2023 1003   K 3.9 09/12/2023 1003   CL 105 09/12/2023 1003   CO2 26 09/12/2023 1003   GLUCOSE 96 09/12/2023 1003   BUN 19 09/12/2023 1003   CREATININE 0.70 09/12/2023 1003   CALCIUM 9.4 09/12/2023 1003   PROT 7.1 09/12/2023 1003   ALBUMIN 4.3 09/12/2023 1003   AST 13 (L) 09/12/2023 1003   ALT 16 09/12/2023 1003   ALKPHOS 99 09/12/2023 1003   BILITOT 0.3 09/12/2023 1003   GFRNONAA >60 09/12/2023 1003   GFRAA >60 10/27/2019 1528       PENDING LABS:   RADIOGRAPHIC STUDIES:  No results found.   PATHOLOGY:     ASSESSMENT and THERAPY PLAN:   No problem-specific Assessment & Plan notes found for this encounter.   No orders of the defined types were placed in this encounter.   All questions were answered. The patient knows to call the clinic with any problems, questions or concerns. We can certainly see the  patient much sooner if necessary. This note was electronically signed. Noreene Filbert, NP 09/12/2023

## 2023-09-12 NOTE — Progress Notes (Signed)
Patient declines to stay for 30 minute post perjeta observation.

## 2023-09-12 NOTE — Telephone Encounter (Signed)
RN placed call to pt with below recommendations.  Pt educated to contact Dr. Avel Sensor office for any further education if need, pt verbalized understanding.

## 2023-09-15 ENCOUNTER — Encounter: Payer: Self-pay | Admitting: Hematology and Oncology

## 2023-09-15 DIAGNOSIS — R7301 Impaired fasting glucose: Secondary | ICD-10-CM | POA: Diagnosis not present

## 2023-09-15 DIAGNOSIS — I10 Essential (primary) hypertension: Secondary | ICD-10-CM | POA: Diagnosis not present

## 2023-09-15 NOTE — Assessment & Plan Note (Signed)
02/11/19: RT lumpectomy: DICS Intermediate grade, Margins Neg, ER 90%, PR 70%; Lt Lumpectomy: UDH status post radiation, could not tolerate tamoxifen recurrence: 08/15/2021:MRI surveillance detected Right breast calcifications 5 cm, Ant biopsy: IG DCIS with necrosis, ER 40%, PR 0%, Posterior biopsy: Intermediate grade DCIS with [facility]  11/29/2021: Right mastectomy: High-grade DCIS with microinvasion, ER 0%, PR 0%, HER2 positive, 0/1 lymph node negative  T1c MIC, N0: Stage Ia   Patient had second opinion at [facility] with Dr. Creig Hines who agreed with her plan of no systemic chemotherapy   Breast cancer surveillance: 1.  Mammogram 08/22/2022: Left breast benign breast density category B  2.  CT CAP 12/09/2022: Multiple liver lesions indicative of cysts and hemangiomas new right hepatic lobe lesion favored to be hemangioma otherwise no evidence of metastatic disease.  Radiology recommended an MRI of the liver. 3.  Liver MRI 01/08/2023: 2 New rim-enhancing lesions 2.3 cm (was 1.6 cm on 12/09/2022), 1.8 cm (measured 1.5 cm), benign hemangioma 2.4 cm and 1.9 cm and 1.3 cm unchanged 4.  PET CT scan 01/22/2023: Liver: 2.5 cm and 2.4 cm lesions ------------------------------------------------------------------------------------------------------------------------------------------------- 01/31/2023: Liver lesion biopsy: Metastatic adenocarcinoma to the liver consistent with breast primary positive for CK7 and GATA3, ER 0%, PR 0%, Ki-67 25%, HER2 3+ positive, Caris molecular testing is pending   Current treatment: Completed 6 cycles of Taxotere Herceptin Perjeta on 05/30/2023, currently on maintenance Herceptin and Perjeta Herceptin Perjeta toxicities: Tolerating it extremely well. She saw Dr. Roland Earl at [facility] for second opinion who agreed with the treatment plan.   Meningioma: Follows with Dr. Barbaraann Cao, brain MRI ordered for 08/04/2023 CT CAP 04/17/2023: Slight progression of metastatic lesions in the liver 1.6 cm (was 2.3  cm), 1.4 cm (was previously 1.8 cm), previously noted small pulmonary nodules 4 mm or less felt to be benign PET/CT: 06/19/2023: Interval resolution of the hypermetabolic lesions in the liver.  Recommendation: Herceptin Perjeta maintenance therapy every 3 weeks  Treatment side effects:  Diarrhea: Initial with Imodium and hydration. Ear infections: Likely not secondary to treatment however these are bothersome to her.  She is experiencing epistaxis with the Flonase and I sent a message to Dr. Suszanne Conners for suggestions on alternatives at the patient's request.   Next restaging scan will occur on September 29, 2023 with PET.  I placed orders for repeat echocardiogram to occur within the next 1-3 weeks.    RTC every three weeks for Herceptin/Perjeta.

## 2023-09-19 ENCOUNTER — Other Ambulatory Visit (HOSPITAL_COMMUNITY): Payer: BC Managed Care – PPO

## 2023-09-22 ENCOUNTER — Encounter: Payer: Self-pay | Admitting: Hematology and Oncology

## 2023-09-24 ENCOUNTER — Encounter: Payer: Self-pay | Admitting: Hematology and Oncology

## 2023-09-24 DIAGNOSIS — I10 Essential (primary) hypertension: Secondary | ICD-10-CM | POA: Diagnosis not present

## 2023-09-24 DIAGNOSIS — Z0000 Encounter for general adult medical examination without abnormal findings: Secondary | ICD-10-CM | POA: Diagnosis not present

## 2023-09-24 DIAGNOSIS — R7303 Prediabetes: Secondary | ICD-10-CM | POA: Diagnosis not present

## 2023-09-24 DIAGNOSIS — E7800 Pure hypercholesterolemia, unspecified: Secondary | ICD-10-CM | POA: Diagnosis not present

## 2023-09-26 ENCOUNTER — Encounter: Payer: Self-pay | Admitting: Hematology and Oncology

## 2023-09-29 ENCOUNTER — Encounter (HOSPITAL_COMMUNITY)
Admission: RE | Admit: 2023-09-29 | Discharge: 2023-09-29 | Disposition: A | Discharge status: Home / Self Care | Payer: BC Managed Care – PPO | Source: Ambulatory Visit | Attending: Hematology and Oncology | Admitting: Hematology and Oncology

## 2023-09-29 DIAGNOSIS — C787 Secondary malignant neoplasm of liver and intrahepatic bile duct: Secondary | ICD-10-CM | POA: Insufficient documentation

## 2023-09-29 DIAGNOSIS — C50919 Malignant neoplasm of unspecified site of unspecified female breast: Secondary | ICD-10-CM | POA: Diagnosis not present

## 2023-09-29 DIAGNOSIS — R918 Other nonspecific abnormal finding of lung field: Secondary | ICD-10-CM | POA: Diagnosis not present

## 2023-09-29 LAB — GLUCOSE, CAPILLARY: Glucose-Capillary: 96 mg/dL (ref 70–99)

## 2023-09-29 MED ORDER — FLUDEOXYGLUCOSE F - 18 (FDG) INJECTION
8.5000 | Freq: Once | INTRAVENOUS | Status: AC | PRN
  Administered 2023-09-29: 8.67 via INTRAVENOUS

## 2023-10-01 ENCOUNTER — Ambulatory Visit (HOSPITAL_COMMUNITY)
Admission: RE | Admit: 2023-10-01 | Discharge: 2023-10-01 | Disposition: A | Discharge status: Home / Self Care | Payer: BC Managed Care – PPO | Source: Ambulatory Visit | Attending: Adult Health | Admitting: Adult Health

## 2023-10-01 DIAGNOSIS — Z01818 Encounter for other preprocedural examination: Secondary | ICD-10-CM | POA: Insufficient documentation

## 2023-10-01 DIAGNOSIS — Z0189 Encounter for other specified special examinations: Secondary | ICD-10-CM | POA: Diagnosis not present

## 2023-10-01 DIAGNOSIS — C50919 Malignant neoplasm of unspecified site of unspecified female breast: Secondary | ICD-10-CM

## 2023-10-01 DIAGNOSIS — C787 Secondary malignant neoplasm of liver and intrahepatic bile duct: Secondary | ICD-10-CM | POA: Diagnosis not present

## 2023-10-01 LAB — ECHOCARDIOGRAM COMPLETE
Area-P 1/2: 4.08 cm2
Calc EF: 46.8 %
S' Lateral: 4.1 cm
Single Plane A2C EF: 51.3 %
Single Plane A4C EF: 46.3 %

## 2023-10-02 ENCOUNTER — Telehealth: Payer: Self-pay | Admitting: Hematology and Oncology

## 2023-10-02 NOTE — Assessment & Plan Note (Addendum)
02/11/19: RT lumpectomy: DICS Intermediate grade, Margins Neg, ER 90%, PR 70%; Lt Lumpectomy: UDH status post radiation, could not tolerate tamoxifen recurrence: 08/15/2021:MRI surveillance detected Right breast calcifications 5 cm, Ant biopsy: IG DCIS with necrosis, ER 40%, PR 0%, Posterior biopsy: Intermediate grade DCIS with [facility]  11/29/2021: Right mastectomy: High-grade DCIS with microinvasion, ER 0%, PR 0%, HER2 positive, 0/1 lymph node negative  T1c MIC, N0: Stage Ia   Patient had second opinion at [facility] with Dr. Creig Hines who agreed with her plan of no systemic chemotherapy   Breast cancer surveillance: 1.  Mammogram 08/22/2022: Left breast benign breast density category B  2.  CT CAP 12/09/2022: Multiple liver lesions indicative of cysts and hemangiomas new right hepatic lobe lesion favored to be hemangioma otherwise no evidence of metastatic disease.  Radiology recommended an MRI of the liver. 3.  Liver MRI 01/08/2023: 2 New rim-enhancing lesions 2.3 cm (was 1.6 cm on 12/09/2022), 1.8 cm (measured 1.5 cm), benign hemangioma 2.4 cm and 1.9 cm and 1.3 cm unchanged 4.  PET CT scan 01/22/2023: Liver: 2.5 cm and 2.4 cm lesions ------------------------------------------------------------------------------------------------------------------------------------------------- 01/31/2023: Liver lesion biopsy: Metastatic adenocarcinoma to the liver consistent with breast primary positive for CK7 and GATA3, ER 0%, PR 0%, Ki-67 25%, HER2 3+ positive, Caris molecular testing is pending   Current treatment: Completed 6 cycles of Taxotere Herceptin Perjeta on 05/30/2023, currently on maintenance Herceptin and Perjeta Herceptin Perjeta toxicities: Tolerating it extremely well. She saw Dr. Roland Earl at [facility] for second opinion who agreed with the treatment plan.   Meningioma: Follows with Dr. Barbaraann Cao, brain MRI  08/04/2023: Stable PET/CT: 06/19/2023: Interval resolution of the hypermetabolic lesions in the liver. PET/CT  10/03/2023: No evidence of recurrent/metastatic cancer low-density liver lesions less conspicuous without any hypermetabolic activity   Recommendation: Herceptin Perjeta maintenance therapy every 3 weeks

## 2023-10-02 NOTE — Telephone Encounter (Signed)
Patient is aware of rescheduled appointment times for follow up and treatment on 10/03/2023

## 2023-10-03 ENCOUNTER — Inpatient Hospital Stay: Payer: BC Managed Care – PPO | Attending: Hematology and Oncology

## 2023-10-03 ENCOUNTER — Inpatient Hospital Stay: Payer: BC Managed Care – PPO

## 2023-10-03 ENCOUNTER — Encounter: Payer: Self-pay | Admitting: Hematology and Oncology

## 2023-10-03 ENCOUNTER — Ambulatory Visit: Payer: BC Managed Care – PPO | Admitting: Hematology and Oncology

## 2023-10-03 ENCOUNTER — Other Ambulatory Visit: Payer: BC Managed Care – PPO

## 2023-10-03 ENCOUNTER — Inpatient Hospital Stay: Payer: BC Managed Care – PPO | Attending: Hematology and Oncology | Admitting: Hematology and Oncology

## 2023-10-03 ENCOUNTER — Ambulatory Visit: Payer: BC Managed Care – PPO

## 2023-10-03 VITALS — BP 155/95 | HR 77 | Temp 97.3°F | Resp 18 | Ht 65.0 in | Wt 176.5 lb

## 2023-10-03 VITALS — BP 127/82 | HR 75 | Temp 98.0°F | Resp 16

## 2023-10-03 DIAGNOSIS — D329 Benign neoplasm of meninges, unspecified: Secondary | ICD-10-CM | POA: Insufficient documentation

## 2023-10-03 DIAGNOSIS — C787 Secondary malignant neoplasm of liver and intrahepatic bile duct: Secondary | ICD-10-CM | POA: Insufficient documentation

## 2023-10-03 DIAGNOSIS — C50911 Malignant neoplasm of unspecified site of right female breast: Secondary | ICD-10-CM | POA: Diagnosis not present

## 2023-10-03 DIAGNOSIS — R931 Abnormal findings on diagnostic imaging of heart and coronary circulation: Secondary | ICD-10-CM | POA: Diagnosis not present

## 2023-10-03 DIAGNOSIS — Z9221 Personal history of antineoplastic chemotherapy: Secondary | ICD-10-CM | POA: Diagnosis not present

## 2023-10-03 DIAGNOSIS — C50919 Malignant neoplasm of unspecified site of unspecified female breast: Secondary | ICD-10-CM

## 2023-10-03 DIAGNOSIS — Z170 Estrogen receptor positive status [ER+]: Secondary | ICD-10-CM | POA: Diagnosis not present

## 2023-10-03 DIAGNOSIS — Z79899 Other long term (current) drug therapy: Secondary | ICD-10-CM | POA: Diagnosis not present

## 2023-10-03 DIAGNOSIS — Z923 Personal history of irradiation: Secondary | ICD-10-CM | POA: Diagnosis not present

## 2023-10-03 DIAGNOSIS — Z5112 Encounter for antineoplastic immunotherapy: Secondary | ICD-10-CM | POA: Insufficient documentation

## 2023-10-03 MED ORDER — HEPARIN SOD (PORK) LOCK FLUSH 100 UNIT/ML IV SOLN
500.0000 [IU] | Freq: Once | INTRAVENOUS | Status: AC | PRN
Start: 2023-10-03 — End: 2023-10-03
  Administered 2023-10-03: 500 [IU]

## 2023-10-03 MED ORDER — ACETAMINOPHEN 325 MG PO TABS
650.0000 mg | ORAL_TABLET | Freq: Once | ORAL | Status: AC
  Administered 2023-10-03: 650 mg via ORAL
  Filled 2023-10-03: qty 2

## 2023-10-03 MED ORDER — SODIUM CHLORIDE 0.9% FLUSH
10.0000 mL | INTRAVENOUS | Status: DC | PRN
  Administered 2023-10-03: 10 mL

## 2023-10-03 MED ORDER — DIPHENHYDRAMINE HCL 25 MG PO CAPS
50.0000 mg | ORAL_CAPSULE | Freq: Once | ORAL | Status: AC
  Administered 2023-10-03: 50 mg via ORAL
  Filled 2023-10-03: qty 2

## 2023-10-03 MED ORDER — SODIUM CHLORIDE 0.9 % IV SOLN: Freq: Once | INTRAVENOUS | Status: AC

## 2023-10-03 MED ORDER — SODIUM CHLORIDE 0.9 % IV SOLN
420.0000 mg | Freq: Once | INTRAVENOUS | Status: AC
  Administered 2023-10-03: 420 mg via INTRAVENOUS
  Filled 2023-10-03: qty 14

## 2023-10-03 MED ORDER — TRASTUZUMAB-ANNS CHEMO 150 MG IV SOLR
6.0000 mg/kg | Freq: Once | INTRAVENOUS | Status: AC
  Administered 2023-10-03: 483 mg via INTRAVENOUS
  Filled 2023-10-03: qty 23

## 2023-10-03 NOTE — Progress Notes (Signed)
Pt. states she has been tolerating treatment and declines to stay for 30 minute post observation. Vital signs stable, left via ambulation, and no respiratory distress noted.

## 2023-10-03 NOTE — Progress Notes (Signed)
Patient Care Team: [name], MD as PCP - General (Family Medicine) Donnelly Angelica, RN as Oncology Nurse Navigator Pershing Proud, RN as Oncology Nurse Navigator Serena Croissant, MD as Consulting Physician (Hematology and Oncology) Default, Provider, MD as Technician  DIAGNOSIS:  Encounter Diagnoses  Name Primary?   Carcinoma of breast metastatic to liver, unspecified laterality (HCC) Yes   Decreased cardiac ejection fraction     SUMMARY OF ONCOLOGIC HISTORY: Oncology History  Breast cancer metastasized to liver (HCC)  01/25/2019 Initial Diagnosis   Screening mammogram showed bilateral calcifications right breast 5 mm, pleomorphic.  Left breast 1.6 cm punctate, smaller group of calcifications spanning 3 mm.  Right lower outer quadrant biopsy revealed low-grade DCIS ER 90%, PR 70%; biopsy of left breast calcifications ALH and PASH   01/31/2019 Genetic Testing   ATM c.8495G>A and RECQL4 c.2587G>A VUS identified on the 9-gene STAT panel and Multi-cancer panel through [facility].  The STAT Breast cancer panel offered by [facility] includes sequencing and rearrangement analysis for the following 9 genes:  ATM, BRCA1, BRCA2, CDH1, CHEK2, PALB2, PTEN, STK11 and TP53.   The Multi-Gene Panel offered by [facility] includes sequencing and/or deletion duplication testing of the following 84 genes: AIP, ALK, APC, ATM, AXIN2,BAP1,  BARD1, BLM, BMPR1A, BRCA1, BRCA2, BRIP1, CASR, CDC73, CDH1, CDK4, CDKN1B, CDKN1C, CDKN2A (p14ARF), CDKN2A (p16INK4a), CEBPA, CHEK2, CTNNA1, DICER1, DIS3L2, EGFR (c.2369C>T, p.Thr790Met variant only), EPCAM (Deletion/duplication testing only), FH, FLCN, GATA2, GPC3, GREM1 (Promoter region deletion/duplication testing only), HOXB13 (c.251G>A, p.Gly84Glu), HRAS, KIT, MAX, MEN1, MET, MITF (c.952G>A, p.Glu318Lys variant only), MLH1, MSH2, MSH3, MSH6, MUTYH, NBN, NF1, NF2, NTHL1, PALB2, PDGFRA, PHOX2B, PMS2, POLD1, POLE, POT1, PRKAR1A, PTCH1, PTEN, RAD50, RAD51C, RAD51D, RB1, RECQL4, RET,  RUNX1, SDHAF2, SDHA (sequence changes only), SDHB, SDHC, SDHD, SMAD4, SMARCA4, SMARCB1, SMARCE1, STK11, SUFU, TERC, TERT, TMEM127, TP53, TSC1, TSC2, VHL, WRN and WT1.  he report date is February 02, 2019.   02/11/2019 Surgery   RT lumpectomy: DICS Intermediate grade, Margins Neg, ER 90%, PR 70%; Lt Lumpectomy: UDH   02/26/2019 -  Anti-estrogen oral therapy   Tamoxifen 20mg  daily, stopped because of depression and facial numbness   05/20/2019 - 06/17/2019 Radiation Therapy   Adjuvant XRT   08/15/2021 Relapse/Recurrence   MRI surveillance detected Right breast calcifications 5 cm, Ant biopsy: IG DCIS with necrosis, ER 40%, PR 0%, Posterior biopsy: Intermediate grade DCIS with [facility]   08/19/2022 Cancer Staging   Staging form: Breast, AJCC 8th Edition - Clinical: Stage IV (cT6mi, cNX, cM1, G3, ER: Unknown, PR: Unknown, HER2: Unknown) - Signed by Serena Croissant, MD on 02/06/2023 Histologic grading system: 3 grade system   02/14/2023 -  Chemotherapy   Patient is on Treatment Plan : BREAST DOCEtaxel + Trastuzumab + Pertuzumab (THP) q21d x 8 cycles / Trastuzumab + Pertuzumab q21d x 4 cycles     AML (acute myeloid leukemia) (HCC)  09/1998 Initial Diagnosis   AML (acute myeloid leukemia) (HCC) treated with 2 induction regimens followed by 6 consolidation regimens, remission     CHIEF COMPLIANT: Follow-up on Herceptin and Perjeta  HISTORY OF PRESENT ILLNESS:   History of Present Illness   The patient, with a history of breast cancer, presents for a follow-up after a PET scan and echocardiogram. She expresses relief at the PET scan results, which showed no evidence of recurrent or metastatic breast cancer. However, she expresses concern about the echocardiogram results, which indicated a low function in the left ventricle. She is aware that this could be a side  effect of her Herceptin treatment.  The patient is currently on a treatment regimen that includes Herceptin, which she receives via a port. She  expresses interest in switching to [facility], a shot-based treatment that she learned about while in [place]. She is hopeful that this treatment could be approved by her insurance, as it would allow her to remove her port and simplify her treatment process.  In addition to her cancer treatment, the patient also discusses her heart health. She is aware that her heart ejection fraction is on the lower end of normal, and she expresses willingness to see a cardiologist to review her medications and potentially improve this number.         ALLERGIES:  is allergic to levaquin [levofloxacin], tamoxifen citrate, and tape.  MEDICATIONS:  Current Outpatient Medications  Medication Sig Dispense Refill   acetaminophen (TYLENOL) 325 MG tablet Take 650 mg by mouth every 6 (six) hours as needed.     ALPRAZolam (XANAX) 0.25 MG tablet 1 tablet Orally once a day if needed for anxiety/panic (not a daily medication, avoid regular use) for 30 days     amoxicillin-clavulanate (AUGMENTIN) 875-125 MG tablet Take 1 tablet by mouth every 12 (twelve) hours.     cholecalciferol (VITAMIN D3) 25 MCG (1000 UNIT) tablet Take 2,000 Units by mouth daily.     DEXAMETHASONE PO Take by mouth. Taking day prior to treatment and day after treatment (Patient not taking: Reported on 08/07/2023)     fluconazole (DIFLUCAN) 100 MG tablet Take 2 tablets today then 1 tablet daily for the next 2 days 4 tablet 0   fluticasone (FLONASE) 50 MCG/ACT nasal spray Place 1 spray into both nostrils daily.     Ibuprofen (ADVIL) 200 MG CAPS Take by mouth.     KLOR-CON M20 20 MEQ tablet TAKE 1 TABLET BY MOUTH EVERY DAY 90 tablet 1   olmesartan (BENICAR) 5 MG tablet Take 10 mg by mouth daily.     oxyCODONE (ROXICODONE) 5 MG immediate release tablet Take 1 tablet (5 mg total) by mouth every 4 (four) hours as needed for severe pain. (Patient not taking: Reported on 09/03/2023) 15 tablet 0   Zinc-Vitamin C (ZINC-A-COLD/VITAMIN C MT) Use as directed 1 capsule in  the mouth or throat daily.     No current facility-administered medications for this visit.    PHYSICAL EXAMINATION: ECOG PERFORMANCE STATUS: 1 - Symptomatic but completely ambulatory  Vitals:   10/03/23 0926  BP: (!) 155/95  Pulse: 77  Resp: 18  Temp: (!) 97.3 F (36.3 C)  SpO2: 99%   Filed Weights   10/03/23 0926  Weight: 176 lb 8 oz (80.1 kg)      LABORATORY DATA:  I have reviewed the data as listed    Latest Ref Rng & Units 09/12/2023   10:03 AM 08/22/2023   10:04 AM 08/01/2023   10:47 AM  CMP  Glucose 70 - 99 mg/dL 96  098  119   BUN 6 - 20 mg/dL 19  16  14    Creatinine 0.44 - 1.00 mg/dL 1.47  8.29  5.62   Sodium 135 - 145 mmol/L 138  140  138   Potassium 3.5 - 5.1 mmol/L 3.9  3.7  3.1   Chloride 98 - 111 mmol/L 105  108  106   CO2 22 - 32 mmol/L 26  27  26    Calcium 8.9 - 10.3 mg/dL 9.4  9.1  8.6   Total Protein 6.5 - 8.1 g/dL  7.1  6.9  6.5   Total Bilirubin 0.3 - 1.2 mg/dL 0.3  0.3  0.4   Alkaline Phos 38 - 126 U/L 99  100  84   AST 15 - 41 U/L 13  15  21    ALT 0 - 44 U/L 16  16  27      Lab Results  Component Value Date   WBC 5.7 09/12/2023   HGB 13.0 09/12/2023   HCT 39.9 09/12/2023   MCV 85.8 09/12/2023   PLT 234 09/12/2023   NEUTROABS 2.7 09/12/2023    ASSESSMENT & PLAN:  Breast cancer metastasized to liver (HCC) 02/11/19: RT lumpectomy: DICS Intermediate grade, Margins Neg, ER 90%, PR 70%; Lt Lumpectomy: UDH status post radiation, could not tolerate tamoxifen recurrence: 08/15/2021:MRI surveillance detected Right breast calcifications 5 cm, Ant biopsy: IG DCIS with necrosis, ER 40%, PR 0%, Posterior biopsy: Intermediate grade DCIS with [facility]  11/29/2021: Right mastectomy: High-grade DCIS with microinvasion, ER 0%, PR 0%, HER2 positive, 0/1 lymph node negative  T1c MIC, N0: Stage Ia   Patient had second opinion at [facility] with Dr. Creig Hines who agreed with her plan of no systemic chemotherapy   Breast cancer surveillance: 1.  Mammogram 08/22/2022: Left  breast benign breast density category B  2.  CT CAP 12/09/2022: Multiple liver lesions indicative of cysts and hemangiomas new right hepatic lobe lesion favored to be hemangioma otherwise no evidence of metastatic disease.  Radiology recommended an MRI of the liver. 3.  Liver MRI 01/08/2023: 2 New rim-enhancing lesions 2.3 cm (was 1.6 cm on 12/09/2022), 1.8 cm (measured 1.5 cm), benign hemangioma 2.4 cm and 1.9 cm and 1.3 cm unchanged 4.  PET CT scan 01/22/2023: Liver: 2.5 cm and 2.4 cm lesions ------------------------------------------------------------------------------------------------------------------------------------------------- 01/31/2023: Liver lesion biopsy: Metastatic adenocarcinoma to the liver consistent with breast primary positive for CK7 and GATA3, ER 0%, PR 0%, Ki-67 25%, HER2 3+ positive, [facility]is molecular testing is pending   Current treatment: Completed 6 cycles of Taxotere Herceptin Perjeta on 05/30/2023, currently on maintenance Herceptin and Perjeta Herceptin Perjeta toxicities: Tolerating it extremely well. She saw Dr. Roland Earl at [facility] for second opinion who agreed with the treatment plan.   Meningioma: Follows with Dr. Barbaraann Cao, brain MRI  08/04/2023: Stable PET/CT: 06/19/2023: Interval resolution of the hypermetabolic lesions in the liver. PET/CT 10/03/2023: No evidence of recurrent/metastatic cancer low-density liver lesions less conspicuous without any hypermetabolic activity   Recommendation: Herceptin Perjeta maintenance therapy every 3 weeks ------------------------------------- Assessment and Plan    Breast Cancer Recent PET scan shows no evidence of recurrent or metastatic disease. Patient is currently on Herceptin maintenance therapy. -Continue Herceptin maintenance therapy. -Consider switching to Fesco injections if approved by insurance.  Cardiac Function Echocardiogram shows low normal left ventricular function (50-55%), possibly due to Herceptin treatment. This  is a slight decrease from previous echocardiogram (55-60%). -Refer to cardiologist (Dr. Milas Kocher) for further evaluation and management. -Continue Herceptin as the decrease in cardiac function does not meet criteria to stop treatment.  Follow-up Next treatment scheduled for 10/24/2023. By then, approval status for Phesgo injections should be known. -Check with pharmacy regarding approval for Phesgo injections. -If approved, consider port removal and switch to Phesgo injections. -Follow-up in a few weeks.          Orders Placed This Encounter  Procedures   Ambulatory referral to Cardiology    Referral Priority:   Routine    Referral Type:   Consultation    Referral Reason:   Specialty Services  Required    Referred to Provider:   Dolores Patty, MD    Requested Specialty:   Cardiology    Number of Visits Requested:   1   The patient has a good understanding of the overall plan. she agrees with it. she will call with any problems that may develop before the next visit here. Total time spent: 30 mins including face to face time and time spent for planning, charting and co-ordination of care   Tamsen Meek, MD 10/03/23

## 2023-10-03 NOTE — Patient Instructions (Addendum)
[facility] - A DEPT OF [facility]  Discharge Instructions: Thank you for choosing [facility] to provide your oncology and hematology care.   If you have a lab appointment with the Cancer Center, please go directly to the Cancer Center and check in at the registration area.   Wear comfortable clothing and clothing appropriate for easy access to any Portacath or PICC line.   We strive to give you quality time with your provider. You may need to reschedule your appointment if you arrive late (15 or more minutes).  Arriving late affects you and other patients whose appointments are after yours.  Also, if you miss three or more appointments without notifying the office, you may be dismissed from the clinic at the provider's discretion.      For prescription refill requests, have your pharmacy contact our office and allow 72 hours for refills to be completed.    Today you received the following chemotherapy and/or immunotherapy agents : Trastuzumab (Kanjinti) and Pertuzumab (Perjeta)      To help prevent nausea and vomiting after your treatment, we encourage you to take your nausea medication as directed.  BELOW ARE SYMPTOMS THAT SHOULD BE REPORTED IMMEDIATELY: *FEVER GREATER THAN 100.4 F (38 C) OR HIGHER *CHILLS OR SWEATING *NAUSEA AND VOMITING THAT IS NOT CONTROLLED WITH YOUR NAUSEA MEDICATION *UNUSUAL SHORTNESS OF BREATH *UNUSUAL BRUISING OR BLEEDING *URINARY PROBLEMS (pain or burning when urinating, or frequent urination) *BOWEL PROBLEMS (unusual diarrhea, constipation, pain near the anus) TENDERNESS IN MOUTH AND THROAT WITH OR WITHOUT PRESENCE OF ULCERS (sore throat, sores in mouth, or a toothache) UNUSUAL RASH, SWELLING OR PAIN  UNUSUAL VAGINAL DISCHARGE OR ITCHING   Items with * indicate a potential emergency and should be followed up as soon as possible or go to the Emergency Department if any problems should occur.  Please show the  CHEMOTHERAPY ALERT CARD or IMMUNOTHERAPY ALERT CARD at check-in to the Emergency Department and triage nurse.  Should you have questions after your visit or need to cancel or reschedule your appointment, please contact [facility] - A DEPT OF [facility]  Dept: [phone]  and follow the prompts.  Office hours are 8:00 a.m. to 4:30 p.m. Monday - Friday. Please note that voicemails left after 4:00 p.m. may not be returned until the following business day.  We are closed weekends and major holidays. You have access to a nurse at all times for urgent questions. Please call the main number to the clinic Dept: [phone] and follow the prompts.   For any non-urgent questions, you may also contact your provider using MyChart. We now offer e-Visits for anyone 79 and older to request care online for non-urgent symptoms. For details visit [email or web address].   Also download the MyChart app! Go to the app store, search "MyChart", open the app, select [facility], and log in with your MyChart username and password.

## 2023-10-06 ENCOUNTER — Other Ambulatory Visit: Payer: Self-pay | Admitting: Pharmacist

## 2023-10-07 ENCOUNTER — Encounter: Payer: Self-pay | Admitting: Hematology and Oncology

## 2023-10-13 ENCOUNTER — Other Ambulatory Visit: Payer: Self-pay | Admitting: Pharmacist

## 2023-10-13 ENCOUNTER — Encounter: Payer: Self-pay | Admitting: Hematology and Oncology

## 2023-10-13 DIAGNOSIS — C50919 Malignant neoplasm of unspecified site of unspecified female breast: Secondary | ICD-10-CM

## 2023-10-14 ENCOUNTER — Telehealth: Payer: Self-pay

## 2023-10-14 NOTE — Telephone Encounter (Signed)
Called pt to let her know Phesgo is approved by insurance and she is scheduled for 10/24/23. She verbalized thanks and understanding,.

## 2023-10-20 ENCOUNTER — Other Ambulatory Visit: Payer: Self-pay | Admitting: Pharmacist

## 2023-10-24 ENCOUNTER — Inpatient Hospital Stay: Payer: BC Managed Care – PPO

## 2023-10-24 ENCOUNTER — Other Ambulatory Visit: Payer: BC Managed Care – PPO

## 2023-10-24 ENCOUNTER — Ambulatory Visit: Payer: BC Managed Care – PPO

## 2023-10-24 VITALS — BP 137/100 | HR 73 | Temp 98.2°F | Resp 18

## 2023-10-24 DIAGNOSIS — C50911 Malignant neoplasm of unspecified site of right female breast: Secondary | ICD-10-CM | POA: Diagnosis not present

## 2023-10-24 DIAGNOSIS — Z9221 Personal history of antineoplastic chemotherapy: Secondary | ICD-10-CM | POA: Diagnosis not present

## 2023-10-24 DIAGNOSIS — Z170 Estrogen receptor positive status [ER+]: Secondary | ICD-10-CM | POA: Diagnosis not present

## 2023-10-24 DIAGNOSIS — C50919 Malignant neoplasm of unspecified site of unspecified female breast: Secondary | ICD-10-CM

## 2023-10-24 DIAGNOSIS — Z79899 Other long term (current) drug therapy: Secondary | ICD-10-CM | POA: Diagnosis not present

## 2023-10-24 DIAGNOSIS — Z5112 Encounter for antineoplastic immunotherapy: Secondary | ICD-10-CM | POA: Diagnosis not present

## 2023-10-24 DIAGNOSIS — K769 Liver disease, unspecified: Secondary | ICD-10-CM

## 2023-10-24 DIAGNOSIS — Z923 Personal history of irradiation: Secondary | ICD-10-CM | POA: Diagnosis not present

## 2023-10-24 DIAGNOSIS — C787 Secondary malignant neoplasm of liver and intrahepatic bile duct: Secondary | ICD-10-CM | POA: Diagnosis not present

## 2023-10-24 DIAGNOSIS — Z95828 Presence of other vascular implants and grafts: Secondary | ICD-10-CM

## 2023-10-24 DIAGNOSIS — D329 Benign neoplasm of meninges, unspecified: Secondary | ICD-10-CM | POA: Diagnosis not present

## 2023-10-24 LAB — CBC WITH DIFFERENTIAL (CANCER CENTER ONLY)
Abs Immature Granulocytes: 0.01 10*3/uL (ref 0.00–0.07)
Basophils Absolute: 0.1 10*3/uL (ref 0.0–0.1)
Basophils Relative: 1 %
Eosinophils Absolute: 0.2 10*3/uL (ref 0.0–0.5)
Eosinophils Relative: 4 %
HCT: 39.2 % (ref 36.0–46.0)
Hemoglobin: 12.8 g/dL (ref 12.0–15.0)
Immature Granulocytes: 0 %
Lymphocytes Relative: 35 %
Lymphs Abs: 2 10*3/uL (ref 0.7–4.0)
MCH: 28.7 pg (ref 26.0–34.0)
MCHC: 32.7 g/dL (ref 30.0–36.0)
MCV: 87.9 fL (ref 80.0–100.0)
Monocytes Absolute: 0.6 10*3/uL (ref 0.1–1.0)
Monocytes Relative: 10 %
Neutro Abs: 2.9 10*3/uL (ref 1.7–7.7)
Neutrophils Relative %: 50 %
Platelet Count: 234 10*3/uL (ref 150–400)
RBC: 4.46 MIL/uL (ref 3.87–5.11)
RDW: 14.6 % (ref 11.5–15.5)
WBC Count: 5.8 10*3/uL (ref 4.0–10.5)
nRBC: 0 % (ref 0.0–0.2)

## 2023-10-24 LAB — CMP (CANCER CENTER ONLY)
ALT: 15 U/L (ref 0–44)
AST: 13 U/L — ABNORMAL LOW (ref 15–41)
Albumin: 4.1 g/dL (ref 3.5–5.0)
Alkaline Phosphatase: 114 U/L (ref 38–126)
Anion gap: 7 (ref 5–15)
BUN: 23 mg/dL — ABNORMAL HIGH (ref 6–20)
CO2: 25 mmol/L (ref 22–32)
Calcium: 8.9 mg/dL (ref 8.9–10.3)
Chloride: 105 mmol/L (ref 98–111)
Creatinine: 0.71 mg/dL (ref 0.44–1.00)
GFR, Estimated: >60 mL/min (ref >60)
Glucose, Bld: 95 mg/dL (ref 70–99)
Potassium: 3.8 mmol/L (ref 3.5–5.1)
Sodium: 137 mmol/L (ref 135–145)
Total Bilirubin: 0.3 mg/dL (ref <1.2)
Total Protein: 6.7 g/dL (ref 6.5–8.1)

## 2023-10-24 MED ORDER — ACETAMINOPHEN 325 MG PO TABS
650.0000 mg | ORAL_TABLET | Freq: Once | ORAL | Status: AC
  Administered 2023-10-24: 650 mg via ORAL
  Filled 2023-10-24: qty 2

## 2023-10-24 MED ORDER — PERTUZ-TRASTUZ-HYALURON-ZZXF 60-60-2000 MG-MG-U/ML CHEMO ~~LOC~~ SOLN
10.0000 mL | Freq: Once | SUBCUTANEOUS | Status: AC
Start: 2023-10-24 — End: 2023-10-24
  Administered 2023-10-24: 10 mL via SUBCUTANEOUS
  Filled 2023-10-24: qty 10

## 2023-10-24 MED ORDER — DIPHENHYDRAMINE HCL 25 MG PO CAPS
50.0000 mg | ORAL_CAPSULE | Freq: Once | ORAL | Status: AC
  Administered 2023-10-24: 50 mg via ORAL
  Filled 2023-10-24: qty 2

## 2023-10-24 MED ORDER — SODIUM CHLORIDE 0.9% FLUSH
10.0000 mL | Freq: Once | INTRAVENOUS | Status: AC
  Administered 2023-10-24: 10 mL

## 2023-10-24 NOTE — Progress Notes (Signed)
Patient waited 15 minute post phesgo injection with no issues. She tolerated it well. Ambulatory to the lobby.

## 2023-10-24 NOTE — Patient Instructions (Signed)
Pertuzumab; Trastuzumab; Hyaluronidase Injection What is this medication? PERTUZUMAB; TRASTUZUMAB; HYALURONIDASE (per TOOZ ue mab; tras TOO zoo mab; hye al ur ON i dase) treats breast cancer. Pertuzumab and trastuzumab work by blocking a protein that causes cancer cells to grow and multiply. This helps to slow or stop the spread of cancer cells. Hyaluronidase works by increasing the absorption of other medications in the body to help them work better. It is a combination medication that contains two monoclonal antibodies. This medicine may be used for other purposes; ask your health care provider or pharmacist if you have questions. COMMON BRAND NAME(S): PHESGO What should I tell my care team before I take this medication? They need to know if you have any of these conditions: Heart failure High blood pressure Irregular heartbeat or rhythm Lung disease An unusual or allergic reaction to pertuzumab, trastuzumab, hyaluronidase, other medications, foods, dyes, or preservatives Pregnant or trying to get pregnant Breast-feeding How should I use this medication? This medication is injected under the skin. It is usually given by your care team in a hospital or clinic setting. It may also be given at home by your care team. Talk to your care team about the use of this medication in children. Special care may be needed. Overdosage: If you think you have taken too much of this medicine contact a poison control center or emergency room at once. NOTE: This medicine is only for you. Do not share this medicine with others. What if I miss a dose? Keep appointments for follow-up doses. It is important not to miss your dose. Call your care team if you are unable to keep an appointment. What may interact with this medication? Certain types of chemotherapy, such as daunorubicin, doxorubicin, epirubicin, idarubicin This list may not describe all possible interactions. Give your health care provider a list of all  the medicines, herbs, non-prescription drugs, or dietary supplements you use. Also tell them if you smoke, drink alcohol, or use illegal drugs. Some items may interact with your medicine. What should I watch for while using this medication? Your condition will be monitored carefully while you are receiving this medication. This medication may make you feel generally unwell. This is not uncommon as chemotherapy can affect healthy cells as well as cancer cells. Report any side effects. Continue your course of treatment even though you feel ill unless your care team tells you to stop. This medication may increase your risk of getting an infection. Call your care team for advice if you get a fever, chills, sore throat, or other symptoms of a cold or flu. Do not treat yourself. Try to avoid being around people who are sick. Avoid taking medications that contain aspirin, acetaminophen, ibuprofen, naproxen, or ketoprofen unless instructed by your care team. These medications may hide a fever. Talk to your care team if you may be pregnant. Serious birth defects can occur if you take this medication during pregnancy and for 7 months after the last dose. You will need a negative pregnancy test before starting this medication. Contraception is recommended while taking this medication and for 7 months after the last dose. Your care team can help you find the option that works for you. Do not breastfeed while taking this medication and for 7 months after the last dose. What side effects may I notice from receiving this medication? Side effects that you should report to your care team as soon as possible: Allergic reactions or angioedema--skin rash, itching or hives, swelling of the  face, eyes, lips, tongue, arms, or legs, trouble swallowing or breathing Dry cough, shortness of breath or trouble breathing Heart failure--shortness of breath, swelling of the ankles, feet, or hands, sudden weight gain, unusual weakness  or fatigue Infection--fever, chills, cough, or sore throat Infusion reactions--chest pain, shortness of breath or trouble breathing, feeling faint or lightheaded Side effects that usually do not require medical attention (report these to your care team if they continue or are bothersome): Diarrhea Hair loss Nausea Pain, redness, or irritation at injection site Pain, redness, or swelling with sores inside the mouth or throat Unusual weakness or fatigue This list may not describe all possible side effects. Call your doctor for medical advice about side effects. You may report side effects to FDA at 1-800-FDA-1088. Where should I keep my medication? This medication is given in a hospital or clinic. It will not be stored at home. NOTE: This sheet is a summary. It may not cover all possible information. If you have questions about this medicine, talk to your doctor, pharmacist, or health care provider.  2024 Elsevier/Gold Standard (2022-03-29 00:00:00)

## 2023-10-27 ENCOUNTER — Telehealth: Payer: Self-pay

## 2023-10-27 NOTE — Telephone Encounter (Signed)
Joann Meadows states that she is doing well. She is eating, drinking, and urinating well.  She does have a red and warm rash on her thigh at the injection site. It is not itchy.  Reviewed with Dr. Pamelia Hoit. Pt needs to mark the area of the rash to make sure it is not enlarging. She can apply ice to the site as well. Call the office if the rash is increasing in size. Pt verbalized understanding. She also states that she has developed a H/A since Friday. She is not sure if it is from the Phesgo or her BP.  Her BP is runing about 160/100. Her Benicar was increased on 09-15-23 by PCP to 20 mg. Dr. Pamelia Hoit recommends f/u with PCP for elevated BP. Her H/A if due to the BP. Pt will f/u with PCP. She knows to call the office at [phone] if  she has any questions or concerns.

## 2023-10-27 NOTE — Telephone Encounter (Signed)
-----   Message from Nurse Barbara Cower E sent at 10/24/2023  9:20 AM EST ----- Regarding: first time Phesgo call back- Gudena Patient received phesgo injection for time. She is followed by Dr. Pamelia Hoit. Injection went well with no issues she tolerated it well

## 2023-10-29 DIAGNOSIS — Z1211 Encounter for screening for malignant neoplasm of colon: Secondary | ICD-10-CM | POA: Diagnosis not present

## 2023-10-29 DIAGNOSIS — K648 Other hemorrhoids: Secondary | ICD-10-CM | POA: Diagnosis not present

## 2023-10-29 DIAGNOSIS — D123 Benign neoplasm of transverse colon: Secondary | ICD-10-CM | POA: Diagnosis not present

## 2023-10-29 DIAGNOSIS — Z800 Family history of malignant neoplasm of digestive organs: Secondary | ICD-10-CM | POA: Diagnosis not present

## 2023-10-31 DIAGNOSIS — N760 Acute vaginitis: Secondary | ICD-10-CM | POA: Diagnosis not present

## 2023-10-31 DIAGNOSIS — N926 Irregular menstruation, unspecified: Secondary | ICD-10-CM | POA: Diagnosis not present

## 2023-10-31 DIAGNOSIS — I10 Essential (primary) hypertension: Secondary | ICD-10-CM | POA: Diagnosis not present

## 2023-10-31 DIAGNOSIS — R8290 Unspecified abnormal findings in urine: Secondary | ICD-10-CM | POA: Diagnosis not present

## 2023-11-06 ENCOUNTER — Other Ambulatory Visit: Payer: Self-pay | Admitting: Hematology and Oncology

## 2023-11-14 ENCOUNTER — Inpatient Hospital Stay: Payer: BC Managed Care – PPO

## 2023-11-14 ENCOUNTER — Ambulatory Visit: Payer: BC Managed Care – PPO

## 2023-11-14 ENCOUNTER — Inpatient Hospital Stay: Payer: BC Managed Care – PPO | Attending: Hematology and Oncology

## 2023-11-14 ENCOUNTER — Ambulatory Visit: Payer: BC Managed Care – PPO | Admitting: Adult Health

## 2023-11-14 ENCOUNTER — Inpatient Hospital Stay (HOSPITAL_BASED_OUTPATIENT_CLINIC_OR_DEPARTMENT_OTHER): Payer: BC Managed Care – PPO | Admitting: Hematology and Oncology

## 2023-11-14 VITALS — BP 156/97 | HR 73 | Temp 97.6°F | Resp 18

## 2023-11-14 VITALS — BP 156/88 | HR 88 | Temp 97.9°F | Resp 18 | Ht 65.0 in | Wt 176.8 lb

## 2023-11-14 DIAGNOSIS — C50919 Malignant neoplasm of unspecified site of unspecified female breast: Secondary | ICD-10-CM

## 2023-11-14 DIAGNOSIS — C50911 Malignant neoplasm of unspecified site of right female breast: Secondary | ICD-10-CM | POA: Insufficient documentation

## 2023-11-14 DIAGNOSIS — Z95828 Presence of other vascular implants and grafts: Secondary | ICD-10-CM

## 2023-11-14 DIAGNOSIS — C787 Secondary malignant neoplasm of liver and intrahepatic bile duct: Secondary | ICD-10-CM | POA: Insufficient documentation

## 2023-11-14 DIAGNOSIS — Z923 Personal history of irradiation: Secondary | ICD-10-CM | POA: Insufficient documentation

## 2023-11-14 DIAGNOSIS — Z170 Estrogen receptor positive status [ER+]: Secondary | ICD-10-CM | POA: Insufficient documentation

## 2023-11-14 DIAGNOSIS — K769 Liver disease, unspecified: Secondary | ICD-10-CM

## 2023-11-14 DIAGNOSIS — Z5112 Encounter for antineoplastic immunotherapy: Secondary | ICD-10-CM | POA: Diagnosis not present

## 2023-11-14 LAB — CMP (CANCER CENTER ONLY)
ALT: 26 U/L (ref 0–44)
AST: 17 U/L (ref 15–41)
Albumin: 4.3 g/dL (ref 3.5–5.0)
Alkaline Phosphatase: 106 U/L (ref 38–126)
Anion gap: 7 (ref 5–15)
BUN: 21 mg/dL — ABNORMAL HIGH (ref 6–20)
CO2: 24 mmol/L (ref 22–32)
Calcium: 9 mg/dL (ref 8.9–10.3)
Chloride: 106 mmol/L (ref 98–111)
Creatinine: 0.71 mg/dL (ref 0.44–1.00)
GFR, Estimated: >60 mL/min (ref >60)
Glucose, Bld: 129 mg/dL — ABNORMAL HIGH (ref 70–99)
Potassium: 4.1 mmol/L (ref 3.5–5.1)
Sodium: 137 mmol/L (ref 135–145)
Total Bilirubin: 0.4 mg/dL (ref <1.2)
Total Protein: 6.8 g/dL (ref 6.5–8.1)

## 2023-11-14 LAB — CBC WITH DIFFERENTIAL (CANCER CENTER ONLY)
Abs Immature Granulocytes: 0.02 10*3/uL (ref 0.00–0.07)
Basophils Absolute: 0.1 10*3/uL (ref 0.0–0.1)
Basophils Relative: 1 %
Eosinophils Absolute: 0.2 10*3/uL (ref 0.0–0.5)
Eosinophils Relative: 4 %
HCT: 38.8 % (ref 36.0–46.0)
Hemoglobin: 13.1 g/dL (ref 12.0–15.0)
Immature Granulocytes: 0 %
Lymphocytes Relative: 30 %
Lymphs Abs: 1.7 10*3/uL (ref 0.7–4.0)
MCH: 29.2 pg (ref 26.0–34.0)
MCHC: 33.8 g/dL (ref 30.0–36.0)
MCV: 86.4 fL (ref 80.0–100.0)
Monocytes Absolute: 0.4 10*3/uL (ref 0.1–1.0)
Monocytes Relative: 7 %
Neutro Abs: 3.4 10*3/uL (ref 1.7–7.7)
Neutrophils Relative %: 58 %
Platelet Count: 225 10*3/uL (ref 150–400)
RBC: 4.49 MIL/uL (ref 3.87–5.11)
RDW: 14.6 % (ref 11.5–15.5)
WBC Count: 5.9 10*3/uL (ref 4.0–10.5)
nRBC: 0 % (ref 0.0–0.2)

## 2023-11-14 LAB — PREGNANCY, URINE: Preg Test, Ur: NEGATIVE

## 2023-11-14 MED ORDER — PERTUZ-TRASTUZ-HYALURON-ZZXF 60-60-2000 MG-MG-U/ML CHEMO ~~LOC~~ SOLN
10.0000 mL | Freq: Once | SUBCUTANEOUS | Status: AC
  Administered 2023-11-14: 10 mL via SUBCUTANEOUS
  Filled 2023-11-14: qty 10

## 2023-11-14 MED ORDER — HEPARIN SOD (PORK) LOCK FLUSH 100 UNIT/ML IV SOLN
500.0000 [IU] | Freq: Once | INTRAVENOUS | Status: AC
  Administered 2023-11-14: 500 [IU]

## 2023-11-14 MED ORDER — SODIUM CHLORIDE 0.9% FLUSH
10.0000 mL | Freq: Once | INTRAVENOUS | Status: AC
  Administered 2023-11-14: 10 mL

## 2023-11-14 MED ORDER — ACETAMINOPHEN 325 MG PO TABS
650.0000 mg | ORAL_TABLET | Freq: Once | ORAL | Status: AC
Start: 2023-11-14 — End: 2023-11-14
  Administered 2023-11-14: 650 mg via ORAL
  Filled 2023-11-14: qty 2

## 2023-11-14 MED ORDER — DIPHENHYDRAMINE HCL 25 MG PO CAPS
50.0000 mg | ORAL_CAPSULE | Freq: Once | ORAL | Status: AC
Start: 2023-11-14 — End: 2023-11-14
  Administered 2023-11-14: 50 mg via ORAL
  Filled 2023-11-14: qty 2

## 2023-11-14 NOTE — Assessment & Plan Note (Signed)
02/11/19: RT lumpectomy: DICS Intermediate grade, Margins Neg, ER 90%, PR 70%; Lt Lumpectomy: UDH status post radiation, could not tolerate tamoxifen recurrence: 08/15/2021:MRI surveillance detected Right breast calcifications 5 cm, Ant biopsy: IG DCIS with necrosis, ER 40%, PR 0%, Posterior biopsy: Intermediate grade DCIS with [facility]  11/29/2021: Right mastectomy: High-grade DCIS with microinvasion, ER 0%, PR 0%, HER2 positive, 0/1 lymph node negative  T1c MIC, N0: Stage Ia   Patient had second opinion at [facility] with Dr. Creig Hines who agreed with her plan of no systemic chemotherapy   Breast cancer surveillance: 1.  Mammogram 08/22/2022: Left breast benign breast density category B  2.  CT CAP 12/09/2022: Multiple liver lesions indicative of cysts and hemangiomas new right hepatic lobe lesion favored to be hemangioma otherwise no evidence of metastatic disease.  Radiology recommended an MRI of the liver. 3.  Liver MRI 01/08/2023: 2 New rim-enhancing lesions 2.3 cm (was 1.6 cm on 12/09/2022), 1.8 cm (measured 1.5 cm), benign hemangioma 2.4 cm and 1.9 cm and 1.3 cm unchanged 4.  PET CT scan 01/22/2023: Liver: 2.5 cm and 2.4 cm lesions ------------------------------------------------------------------------------------------------------------------------------------------------- 01/31/2023: Liver lesion biopsy: Metastatic adenocarcinoma to the liver consistent with breast primary positive for CK7 and GATA3, ER 0%, PR 0%, Ki-67 25%, HER2 3+ positive, [facility] molecular testing is pending   Current treatment: Completed 6 cycles of Taxotere Herceptin Perjeta on 05/30/2023, currently on maintenance Herceptin and Perjeta Herceptin Perjeta toxicities: Tolerating it extremely well. She saw Dr. Roland Earl at [facility] for second opinion who agreed with the treatment plan.   Meningioma: Follows with Dr. Barbaraann Cao, brain MRI  08/04/2023: Stable PET/CT: 06/19/2023: Interval resolution of the hypermetabolic lesions in the liver. PET/CT  10/03/2023: No evidence of recurrent/metastatic cancer low-density liver lesions less conspicuous without any hypermetabolic activity   Recommendation:Phesgo.  Every 3 weeks

## 2023-11-14 NOTE — Patient Instructions (Signed)
Pertuzumab; Trastuzumab; Hyaluronidase Injection What is this medication? PERTUZUMAB; TRASTUZUMAB; HYALURONIDASE (per TOOZ ue mab; tras TOO zoo mab; hye al ur ON i dase) treats breast cancer. Pertuzumab and trastuzumab work by blocking a protein that causes cancer cells to grow and multiply. This helps to slow or stop the spread of cancer cells. Hyaluronidase works by increasing the absorption of other medications in the body to help them work better. It is a combination medication that contains two monoclonal antibodies. This medicine may be used for other purposes; ask your health care provider or pharmacist if you have questions. COMMON BRAND NAME(S): PHESGO What should I tell my care team before I take this medication? They need to know if you have any of these conditions: Heart failure High blood pressure Irregular heartbeat or rhythm Lung disease An unusual or allergic reaction to pertuzumab, trastuzumab, hyaluronidase, other medications, foods, dyes, or preservatives Pregnant or trying to get pregnant Breast-feeding How should I use this medication? This medication is injected under the skin. It is usually given by your care team in a hospital or clinic setting. It may also be given at home by your care team. Talk to your care team about the use of this medication in children. Special care may be needed. Overdosage: If you think you have taken too much of this medicine contact a poison control center or emergency room at once. NOTE: This medicine is only for you. Do not share this medicine with others. What if I miss a dose? Keep appointments for follow-up doses. It is important not to miss your dose. Call your care team if you are unable to keep an appointment. What may interact with this medication? Certain types of chemotherapy, such as daunorubicin, doxorubicin, epirubicin, idarubicin This list may not describe all possible interactions. Give your health care provider a list of all  the medicines, herbs, non-prescription drugs, or dietary supplements you use. Also tell them if you smoke, drink alcohol, or use illegal drugs. Some items may interact with your medicine. What should I watch for while using this medication? Your condition will be monitored carefully while you are receiving this medication. This medication may make you feel generally unwell. This is not uncommon as chemotherapy can affect healthy cells as well as cancer cells. Report any side effects. Continue your course of treatment even though you feel ill unless your care team tells you to stop. This medication may increase your risk of getting an infection. Call your care team for advice if you get a fever, chills, sore throat, or other symptoms of a cold or flu. Do not treat yourself. Try to avoid being around people who are sick. Avoid taking medications that contain aspirin, acetaminophen, ibuprofen, naproxen, or ketoprofen unless instructed by your care team. These medications may hide a fever. Talk to your care team if you may be pregnant. Serious birth defects can occur if you take this medication during pregnancy and for 7 months after the last dose. You will need a negative pregnancy test before starting this medication. Contraception is recommended while taking this medication and for 7 months after the last dose. Your care team can help you find the option that works for you. Do not breastfeed while taking this medication and for 7 months after the last dose. What side effects may I notice from receiving this medication? Side effects that you should report to your care team as soon as possible: Allergic reactions or angioedema--skin rash, itching or hives, swelling of the  face, eyes, lips, tongue, arms, or legs, trouble swallowing or breathing Dry cough, shortness of breath or trouble breathing Heart failure--shortness of breath, swelling of the ankles, feet, or hands, sudden weight gain, unusual weakness  or fatigue Infection--fever, chills, cough, or sore throat Infusion reactions--chest pain, shortness of breath or trouble breathing, feeling faint or lightheaded Side effects that usually do not require medical attention (report these to your care team if they continue or are bothersome): Diarrhea Hair loss Nausea Pain, redness, or irritation at injection site Pain, redness, or swelling with sores inside the mouth or throat Unusual weakness or fatigue This list may not describe all possible side effects. Call your doctor for medical advice about side effects. You may report side effects to FDA at 1-800-FDA-1088. Where should I keep my medication? This medication is given in a hospital or clinic. It will not be stored at home. NOTE: This sheet is a summary. It may not cover all possible information. If you have questions about this medicine, talk to your doctor, pharmacist, or health care provider.  2024 Elsevier/Gold Standard (2022-03-29 00:00:00)

## 2023-11-14 NOTE — Progress Notes (Signed)
Patient Care Team: [name], MD as PCP - General (Family Medicine) Donnelly Angelica, RN as Oncology Nurse Navigator Pershing Proud, RN as Oncology Nurse Navigator Serena Croissant, MD as Consulting Physician (Hematology and Oncology) Default, Provider, MD as Technician  DIAGNOSIS:  Encounter Diagnosis  Name Primary?   Carcinoma of breast metastatic to liver, unspecified laterality (HCC) Yes    SUMMARY OF ONCOLOGIC HISTORY: Oncology History  Breast cancer metastasized to liver (HCC)  01/25/2019 Initial Diagnosis   Screening mammogram showed bilateral calcifications right breast 5 mm, pleomorphic.  Left breast 1.6 cm punctate, smaller group of calcifications spanning 3 mm.  Right lower outer quadrant biopsy revealed low-grade DCIS ER 90%, PR 70%; biopsy of left breast calcifications ALH and PASH   01/31/2019 Genetic Testing   ATM c.8495G>A and RECQL4 c.2587G>A VUS identified on the 9-gene STAT panel and Multi-cancer panel through [facility].  The STAT Breast cancer panel offered by [facility] includes sequencing and rearrangement analysis for the following 9 genes:  ATM, BRCA1, BRCA2, CDH1, CHEK2, PALB2, PTEN, STK11 and TP53.   The Multi-Gene Panel offered by [facility] includes sequencing and/or deletion duplication testing of the following 84 genes: AIP, ALK, APC, ATM, AXIN2,BAP1,  BARD1, BLM, BMPR1A, BRCA1, BRCA2, BRIP1, CASR, CDC73, CDH1, CDK4, CDKN1B, CDKN1C, CDKN2A (p14ARF), CDKN2A (p16INK4a), CEBPA, CHEK2, CTNNA1, DICER1, DIS3L2, EGFR (c.2369C>T, p.Thr790Met variant only), EPCAM (Deletion/duplication testing only), FH, FLCN, GATA2, GPC3, GREM1 (Promoter region deletion/duplication testing only), HOXB13 (c.251G>A, p.Gly84Glu), HRAS, KIT, MAX, MEN1, MET, MITF (c.952G>A, p.Glu318Lys variant only), MLH1, MSH2, MSH3, MSH6, MUTYH, NBN, NF1, NF2, NTHL1, PALB2, PDGFRA, PHOX2B, PMS2, POLD1, POLE, POT1, PRKAR1A, PTCH1, PTEN, RAD50, RAD51C, RAD51D, RB1, RECQL4, RET, RUNX1, SDHAF2, SDHA (sequence changes  only), SDHB, SDHC, SDHD, SMAD4, SMARCA4, SMARCB1, SMARCE1, STK11, SUFU, TERC, TERT, TMEM127, TP53, TSC1, TSC2, VHL, WRN and WT1.  he report date is February 02, 2019.   02/11/2019 Surgery   RT lumpectomy: DICS Intermediate grade, Margins Neg, ER 90%, PR 70%; Lt Lumpectomy: UDH   02/26/2019 -  Anti-estrogen oral therapy   Tamoxifen 20mg  daily, stopped because of depression and facial numbness   05/20/2019 - 06/17/2019 Radiation Therapy   Adjuvant XRT   08/15/2021 Relapse/Recurrence   MRI surveillance detected Right breast calcifications 5 cm, Ant biopsy: IG DCIS with necrosis, ER 40%, PR 0%, Posterior biopsy: Intermediate grade DCIS with [facility]   08/19/2022 Cancer Staging   Staging form: Breast, AJCC 8th Edition - Clinical: Stage IV (cT74mi, cNX, cM1, G3, ER: Unknown, PR: Unknown, HER2: Unknown) - Signed by Serena Croissant, MD on 02/06/2023 Histologic grading system: 3 grade system   02/14/2023 -  Chemotherapy   Patient is on Treatment Plan : BREAST DOCEtaxel + Trastuzumab + Pertuzumab (THP) q21d x 8 cycles / Trastuzumab + Pertuzumab q21d x 4 cycles     AML (acute myeloid leukemia) (HCC)  09/1998 Initial Diagnosis   AML (acute myeloid leukemia) (HCC) treated with 2 induction regimens followed by 6 consolidation regimens, remission     CHIEF COMPLIANT: Follow-up on Phesgo  HISTORY OF PRESENT ILLNESS:   History of Present Illness   Joann Meadows, a patient with a history of met breast cancer, presents for phesgo treatment.  she experiences side effects such as redness and soreness at the injection site, which lasts for about four days. She also reports feeling more tired than usual, which she is unsure if it is related to her menstrual cycle or the injections.  In addition to the injection-related concerns, the patient also reports having diarrhea.  She does not describe it as severe, but notes that she goes to the bathroom more frequently than usual. She is currently taking one potassium pill daily, but  is considering stopping due to concerns about high blood pressure, which she has been experiencing recently.       ALLERGIES:  is allergic to levaquin [levofloxacin], tamoxifen citrate, and tape.  MEDICATIONS:  Current Outpatient Medications  Medication Sig Dispense Refill   acetaminophen (TYLENOL) 325 MG tablet Take 650 mg by mouth every 6 (six) hours as needed.     ALPRAZolam (XANAX) 0.25 MG tablet 1 tablet Orally once a day if needed for anxiety/panic (not a daily medication, avoid regular use) for 30 days     amoxicillin-clavulanate (AUGMENTIN) 875-125 MG tablet Take 1 tablet by mouth every 12 (twelve) hours.     cholecalciferol (VITAMIN D3) 25 MCG (1000 UNIT) tablet Take 2,000 Units by mouth daily.     DEXAMETHASONE PO Take by mouth. Taking day prior to treatment and day after treatment (Patient not taking: Reported on 08/07/2023)     fluconazole (DIFLUCAN) 100 MG tablet Take 2 tablets today then 1 tablet daily for the next 2 days 4 tablet 0   fluticasone (FLONASE) 50 MCG/ACT nasal spray Place 1 spray into both nostrils daily.     Ibuprofen (ADVIL) 200 MG CAPS Take by mouth.     KLOR-CON M20 20 MEQ tablet TAKE 1 TABLET BY MOUTH EVERY DAY 90 tablet 1   olmesartan (BENICAR) 20 MG tablet Take 20 mg by mouth daily.     oxyCODONE (ROXICODONE) 5 MG immediate release tablet Take 1 tablet (5 mg total) by mouth every 4 (four) hours as needed for severe pain. (Patient not taking: Reported on 09/03/2023) 15 tablet 0   Zinc-Vitamin C (ZINC-A-COLD/VITAMIN C MT) Use as directed 1 capsule in the mouth or throat daily.     No current facility-administered medications for this visit.   Facility-Administered Medications Ordered in Other Visits  Medication Dose Route Frequency Provider Last Rate Last Admin   acetaminophen (TYLENOL) tablet 650 mg  650 mg Oral Once Loa Socks, NP       diphenhydrAMINE (BENADRYL) capsule 50 mg  50 mg Oral Once Causey, Larna Daughters, NP        pertuz-trastuz-hyaluron-zzxf (PHESGO) 600-600-20000 MG-MG-U/10ML chemo SQ injection maintenance dose 10 mL  10 mL Subcutaneous Once Serena Croissant, MD        PHYSICAL EXAMINATION: ECOG PERFORMANCE STATUS: 1 - Symptomatic but completely ambulatory  Vitals:   11/14/23 0931  BP: (!) 156/88  Pulse: 88  Resp: 18  Temp: 97.9 F (36.6 C)  SpO2: 100%   Filed Weights   11/14/23 0931  Weight: 176 lb 12.8 oz (80.2 kg)      LABORATORY DATA:  I have reviewed the data as listed    Latest Ref Rng & Units 11/14/2023    9:10 AM 10/24/2023    7:57 AM 09/12/2023   10:03 AM  CMP  Glucose 70 - 99 mg/dL 621  95  96   BUN 6 - 20 mg/dL 21  23  19    Creatinine 0.44 - 1.00 mg/dL 3.08  6.57  8.46   Sodium 135 - 145 mmol/L 137  137  138   Potassium 3.5 - 5.1 mmol/L 4.1  3.8  3.9   Chloride 98 - 111 mmol/L 106  105  105   CO2 22 - 32 mmol/L 24  25  26    Calcium 8.9 - 10.3  mg/dL 9.0  8.9  9.4   Total Protein 6.5 - 8.1 g/dL 6.8  6.7  7.1   Total Bilirubin <1.2 mg/dL 0.4  0.3  0.3   Alkaline Phos 38 - 126 U/L 106  114  99   AST 15 - 41 U/L 17  13  13    ALT 0 - 44 U/L 26  15  16      Lab Results  Component Value Date   WBC 5.9 11/14/2023   HGB 13.1 11/14/2023   HCT 38.8 11/14/2023   MCV 86.4 11/14/2023   PLT 225 11/14/2023   NEUTROABS 3.4 11/14/2023    ASSESSMENT & PLAN:  Breast cancer metastasized to liver (HCC) 02/11/19: RT lumpectomy: DICS Intermediate grade, Margins Neg, ER 90%, PR 70%; Lt Lumpectomy: UDH status post radiation, could not tolerate tamoxifen recurrence: 08/15/2021:MRI surveillance detected Right breast calcifications 5 cm, Ant biopsy: IG DCIS with necrosis, ER 40%, PR 0%, Posterior biopsy: Intermediate grade DCIS with [facility]  11/29/2021: Right mastectomy: High-grade DCIS with microinvasion, ER 0%, PR 0%, HER2 positive, 0/1 lymph node negative  T1c MIC, N0: Stage Ia   Patient had second opinion at [facility] with Dr. Creig Hines who agreed with her plan of no systemic chemotherapy    Breast cancer surveillance: 1.  Mammogram 08/22/2022: Left breast benign breast density category B  2.  CT CAP 12/09/2022: Multiple liver lesions indicative of cysts and hemangiomas new right hepatic lobe lesion favored to be hemangioma otherwise no evidence of metastatic disease.  Radiology recommended an MRI of the liver. 3.  Liver MRI 01/08/2023: 2 New rim-enhancing lesions 2.3 cm (was 1.6 cm on 12/09/2022), 1.8 cm (measured 1.5 cm), benign hemangioma 2.4 cm and 1.9 cm and 1.3 cm unchanged 4.  PET CT scan 01/22/2023: Liver: 2.5 cm and 2.4 cm lesions ------------------------------------------------------------------------------------------------------------------------------------------------- 01/31/2023: Liver lesion biopsy: Metastatic adenocarcinoma to the liver consistent with breast primary positive for CK7 and GATA3, ER 0%, PR 0%, Ki-67 25%, HER2 3+ positive, [facility] molecular testing is pending   Current treatment: Completed 6 cycles of Taxotere Herceptin Perjeta on 05/30/2023, currently on maintenance Herceptin and Perjeta Herceptin Perjeta toxicities: Tolerating it extremely well. She saw [facility]. Roland Earl at [facility] for second opinion who agreed with the treatment plan.   Meningioma: Follows with Dr. Barbaraann Cao, brain MRI  08/04/2023: Stable PET/CT: 06/19/2023: Interval resolution of the hypermetabolic lesions in the liver. PET/CT 10/03/2023: No evidence of recurrent/metastatic cancer low-density liver lesions less conspicuous without any hypermetabolic activity   Scans will be done in February. Recommendation:Phesgo.  Every 3 weeks ------------------------------------- Assessment and Plan    Cancer Treatment Patient reports localized redness and heat at injection site for four days post-injection, but overall finds the treatment preferable due to its speed. Mild fatigue reported, potentially related to menstrual cycle. -Continue current injection regimen.  Diarrhea Persistent, but not severe.  Patient currently taking one potassium pill daily. -Discontinue potassium supplementation. -Recheck potassium levels at next lab work.  Hypertension Patient reports high blood pressure episodes. Current medication may increase potassium levels. -Advise reduction in salt and high-calorie foods. -Encourage regular exercise.  Port Maintenance Patient inquires about necessity of port. -Recommend keeping port for at least one more year.  Cardiology Consult Previous ECHO showed slight decrease. No appointment has been made with cardiologist. -Ensure appointment with cardiologist is scheduled.  General Health Maintenance -Plan total body CT scan (neck to pelvis) for January 09, 2024. -Next appointment scheduled for December 04, 2023. Doctor visit not necessary at this time.  Orders Placed This Encounter  Procedures   CT CHEST ABDOMEN PELVIS W CONTRAST    Standing Status:   Future    Expected Date:   01/09/2024    Expiration Date:   11/13/2024    If indicated for the ordered procedure, I authorize the administration of contrast media per Radiology protocol:   Yes    Does the patient have a contrast media/X-ray dye allergy?:   No    Is patient pregnant?:   No    Preferred imaging location?:   [facility]    Release to patient:   Immediate    If indicated for the ordered procedure, I authorize the administration of oral contrast media per Radiology protocol:   Yes   The patient has a good understanding of the overall plan. she agrees with it. she will call with any problems that may develop before the next visit here. Total time spent: 30 mins including face to face time and time spent for planning, charting and co-ordination of care   Tamsen Meek, MD 11/14/23

## 2023-11-21 ENCOUNTER — Ambulatory Visit (HOSPITAL_COMMUNITY)
Admission: RE | Admit: 2023-11-21 | Discharge: 2023-11-21 | Disposition: A | Discharge status: Home / Self Care | Payer: BC Managed Care – PPO | Source: Ambulatory Visit | Attending: Internal Medicine | Admitting: Internal Medicine

## 2023-11-21 ENCOUNTER — Other Ambulatory Visit (HOSPITAL_COMMUNITY): Payer: Self-pay

## 2023-11-21 ENCOUNTER — Encounter (HOSPITAL_COMMUNITY): Payer: Self-pay | Admitting: Internal Medicine

## 2023-11-21 VITALS — BP 140/94 | HR 78 | Ht 65.0 in | Wt 178.0 lb

## 2023-11-21 DIAGNOSIS — I10 Essential (primary) hypertension: Secondary | ICD-10-CM | POA: Diagnosis not present

## 2023-11-21 DIAGNOSIS — Z79810 Long term (current) use of selective estrogen receptor modulators (SERMs): Secondary | ICD-10-CM | POA: Diagnosis not present

## 2023-11-21 DIAGNOSIS — I427 Cardiomyopathy due to drug and external agent: Secondary | ICD-10-CM

## 2023-11-21 DIAGNOSIS — T451X5A Adverse effect of antineoplastic and immunosuppressive drugs, initial encounter: Secondary | ICD-10-CM

## 2023-11-21 DIAGNOSIS — Z856 Personal history of leukemia: Secondary | ICD-10-CM | POA: Insufficient documentation

## 2023-11-21 DIAGNOSIS — Z87891 Personal history of nicotine dependence: Secondary | ICD-10-CM | POA: Diagnosis not present

## 2023-11-21 DIAGNOSIS — Z79899 Other long term (current) drug therapy: Secondary | ICD-10-CM | POA: Insufficient documentation

## 2023-11-21 DIAGNOSIS — Z923 Personal history of irradiation: Secondary | ICD-10-CM | POA: Diagnosis not present

## 2023-11-21 DIAGNOSIS — C50919 Malignant neoplasm of unspecified site of unspecified female breast: Secondary | ICD-10-CM | POA: Diagnosis not present

## 2023-11-21 DIAGNOSIS — R5383 Other fatigue: Secondary | ICD-10-CM | POA: Insufficient documentation

## 2023-11-21 DIAGNOSIS — Z170 Estrogen receptor positive status [ER+]: Secondary | ICD-10-CM | POA: Insufficient documentation

## 2023-11-21 DIAGNOSIS — C50911 Malignant neoplasm of unspecified site of right female breast: Secondary | ICD-10-CM | POA: Insufficient documentation

## 2023-11-21 DIAGNOSIS — C787 Secondary malignant neoplasm of liver and intrahepatic bile duct: Secondary | ICD-10-CM

## 2023-11-21 DIAGNOSIS — Z5181 Encounter for therapeutic drug level monitoring: Secondary | ICD-10-CM | POA: Diagnosis not present

## 2023-11-21 DIAGNOSIS — T451X5S Adverse effect of antineoplastic and immunosuppressive drugs, sequela: Secondary | ICD-10-CM | POA: Insufficient documentation

## 2023-11-21 DIAGNOSIS — Z1721 Progesterone receptor positive status: Secondary | ICD-10-CM | POA: Diagnosis not present

## 2023-11-21 MED ORDER — ENTRESTO 49-51 MG PO TABS: 1.0000 | ORAL_TABLET | Freq: Two times a day (BID) | ORAL | 3 refills | Status: DC

## 2023-11-21 MED ORDER — METOPROLOL SUCCINATE ER 25 MG PO TB24: 25.0000 mg | ORAL_TABLET | Freq: Every day | ORAL | 3 refills | Status: DC

## 2023-11-21 NOTE — Progress Notes (Signed)
CARDIO-ONCOLOGY CLINIC CONSULT NOTE  Referring Physician: Primary Care: Primary Cardiologist:  HPI:  Joann Meadows is 47 y.o. female with breast cancer referred by Dr. Pamelia Hoit for enrollment into the Cardio-Oncology program.  Treated for AML 09/1998 treated with 2 induction regimens followed by 6 consolidation regimens, remission   Diagnosed with R breast CA DCIS in 3/20 ER/PR+  Underwent lumpectomy Treated with tamoxifen and XRT  Relapse in 9/22   9/23 found to have Stage IV CA in 3/24 started DOCEtaxel + Trastuzumab + Pertuzumab (THP) q21d x 8 cycles / Trastuzumab + Pertuzumab q21d x 4 cycles   PET/CT: 06/19/2023: Interval resolution of the hypermetabolic lesions in the liver. PET/CT 10/03/2023: No evidence of recurrent/metastatic cancer low-density liver lesions less conspicuous without any hypermetabolic activity   ECHO:  7/24 EF 55-60% GLS -18.0  ECHO 11/24 50-55% GLS -13.4 (poor tracking  Reports NYHA I-II symptoms, limited more by exertional fatigue. Denies CP. No LEE, orthopnea/PND. Also has HTN, on Benicar 20 mg.   POCU EF 50-55%     Review of Systems: [y] = yes, [ ]  = no   General: Weight gain [ ] ; Weight loss [ ] ; Anorexia [ ] ; Fatigue [ Y]; Fever [ ] ; Chills [ ] ; Weakness [ ]   Cardiac: Chest pain/pressure [ ] ; Resting SOB [ ] ; Exertional SOB [ ] ; Orthopnea [ ] ; Pedal Edema [ ] ; Palpitations [ ] ; Syncope [ ] ; Presyncope [ ] ; Paroxysmal nocturnal dyspnea[ ]   Pulmonary: Cough [ ] ; Wheezing[ ] ; Hemoptysis[ ] ; Sputum [ ] ; Snoring [ ]   GI: Vomiting[ ] ; Dysphagia[ ] ; Melena[ ] ; Hematochezia [ ] ; Heartburn[ ] ; Abdominal pain [ ] ; Constipation [ ] ; Diarrhea [ ] ; BRBPR [ ]   GU: Hematuria[ ] ; Dysuria [ ] ; Nocturia[ ]   Vascular: Pain in legs with walking [ ] ; Pain in feet with lying flat [ ] ; Non-healing sores [ ] ; Stroke [ ] ; TIA [ ] ; Slurred speech [ ] ;  Neuro: Headaches[ ] ; Vertigo[ ] ; Seizures[ ] ; Paresthesias[ ] ;Blurred vision [ ] ; Diplopia [ ] ; Vision changes [ ]    Ortho/Skin: Arthritis [ ] ; Joint pain [ ] ; Muscle pain [ ] ; Joint swelling [ ] ; Back Pain [ ] ; Rash [ ]   Psych: Depression[ ] ; Anxiety[ ]   Heme: Bleeding problems [ ] ; Clotting disorders [ ] ; Anemia [ ]   Endocrine: Diabetes [ ] ; Thyroid dysfunction[ ]    Past Medical History:  Diagnosis Date   Breast cancer (HCC) 2020   Right Breast Cancer   Endometriosis    Fibroid    Hx of radiation therapy 06/2019   Leukemia (HCC)    20 years ago   Personal history of radiation therapy 2020   Right Breast Cancer   PONV (postoperative nausea and vomiting)    pt states she vomits after colonoscopies    Current Outpatient Medications  Medication Sig Dispense Refill   acetaminophen (TYLENOL) 325 MG tablet Take 650 mg by mouth every 6 (six) hours as needed.     ALPRAZolam (XANAX) 0.25 MG tablet 1 tablet Orally once a day if needed for anxiety/panic (not a daily medication, avoid regular use) for 30 days     cholecalciferol (VITAMIN D3) 25 MCG (1000 UNIT) tablet Take 2,000 Units by mouth daily.     DEXAMETHASONE PO Take by mouth. Taking day prior to treatment and day after treatment     fluticasone (FLONASE) 50 MCG/ACT nasal spray Place 1 spray into both nostrils daily.     Ibuprofen (ADVIL) 200 MG CAPS Take by mouth.  loratadine (CLARITIN) 10 MG tablet 1 tablet Orally Once a day     olmesartan (BENICAR) 20 MG tablet Take 20 mg by mouth daily.     Zinc-Vitamin C (ZINC-A-COLD/VITAMIN C MT) Use as directed 1 capsule in the mouth or throat daily.     KLOR-CON M20 20 MEQ tablet TAKE 1 TABLET BY MOUTH EVERY DAY (Patient not taking: Reported on 11/21/2023) 90 tablet 1   No current facility-administered medications for this encounter.    Allergies  Allergen Reactions   Levaquin [Levofloxacin] Swelling    Swelling feet and hands   Tamoxifen Citrate     Other reaction(s): depression, joint pain   Tape Rash      Social History   Socioeconomic History   Marital status: Single    Spouse name:  Not on file   Number of children: 2   Years of education: Not on file   Highest education level: High school graduate  Occupational History   Not on file  Tobacco Use   Smoking status: Former    Current packs/day: 0.00    Types: Cigarettes    Start date: 01/24/2005    Quit date: 01/25/2015    Years since quitting: 8.8   Smokeless tobacco: Never  Vaping Use   Vaping status: Never Used  Substance and Sexual Activity   Alcohol use: No   Drug use: No   Sexual activity: Yes    Birth control/protection: None  Other Topics Concern   Not on file  Social History Narrative   Lives with children   Social Drivers of Health   Financial Resource Strain: Not on file  Food Insecurity: No Food Insecurity (10/03/2022)   Hunger Vital Sign    Worried About Running Out of Food in the Last Year: Never true    Ran Out of Food in the Last Year: Never true  Transportation Needs: No Transportation Needs (10/03/2022)   PRAPARE - Administrator, Civil Service (Medical): No    Lack of Transportation (Non-Medical): No  Physical Activity: Not on file  Stress: Not on file  Social Connections: Not on file  Intimate Partner Violence: Not At Risk (02/26/2019)   Humiliation, Afraid, Rape, and Kick questionnaire    Fear of Current or Ex-Partner: No    Emotionally Abused: No    Physically Abused: No    Sexually Abused: No      Family History  Problem Relation Age of Onset   Colon cancer Mother 4       d. 38   Hypertension Father    Diabetes Maternal Aunt    Diabetes Maternal Uncle    Mental retardation Maternal Uncle    Diabetes Paternal Aunt    Diabetes Maternal Grandmother    Stroke Paternal Grandmother        c. 58   Leukemia Paternal Grandfather        d. 56; ?CLL   Leukemia Cousin 4       mat first cousin   Lymphoma Cousin 40       pat first cousin    Vitals:   11/21/23 0947  BP: (!) 140/94  Pulse: 78  SpO2: 96%  Weight: 80.7 kg (178 lb)  Height: 5\' 5"  (1.651 m)     PHYSICAL EXAM: General:  Well appearing. No respiratory difficulty HEENT: normal Neck: supple. no JVD. Carotids 2+ bilat; no bruits. No lymphadenopathy or thryomegaly appreciated. Cor: PMI nondisplaced. Regular rate & rhythm. No rubs, gallops or murmurs.  Lungs: clear Abdomen: soft, nontender, nondistended. No hepatosplenomegaly. No bruits or masses. Good bowel sounds. Extremities: no cyanosis, clubbing, rash, edema Neuro: alert & oriented x 3, cranial nerves grossly intact. moves all 4 extremities w/o difficulty. Affect pleasant.  ECG: NSR 77 bpm, personally reviewed    ASSESSMENT & PLAN: 1. Right Breast Cancer - stage IV - ECHO:  7/24 EF 55-60% GLS -18.0  - ECHO 10/01/23 50-55% GLS -13.4 (poor tracking) - She has had 2 additional rounds of Herceptin since her most recent Echo  - Clinic POCU today EF stable, 50-55%  - NYHA Class I-II, limited more by fatigue  - start GDMT - Stop Olmesartan 20  - Start Entresto 49-51 mg bid - Start Toprol XL 25 mg daily  - euvolemic, does not need loop diuretic - check BMP today  - repeat serial echos q2 months   2. Hypertension  - mod elevated, 140/90 - HF GDMT per above   F/u in 1 month for GDMT titration. F/u in 2 months w/ echo     Arvilla Meres, MD  9:59 AM

## 2023-11-21 NOTE — Patient Instructions (Signed)
Great to see you today!!!  Medication Changes:  STOP Olmesartan  START Entresto 49/51 mg Twice daily   START Metoprolol XL 25 mg Daily   Follow-Up in: 1 month and again in 2 months with a repeat echocardiogram   At the [facility], you and your health needs are our priority. We have a designated team specialized in the treatment of Heart Failure. This Care Team includes your primary Heart Failure Specialized Cardiologist (physician), Advanced Practice Providers (APPs- Physician Assistants and Nurse Practitioners), and Pharmacist who all work together to provide you with the care you need, when you need it.   You may see any of the following providers on your designated Care Team at your next follow up:  Dr. Arvilla Meres Dr. Marca Ancona Dr. Dorthula Nettles Dr. Theresia Bough Tonye Becket, NP Robbie Lis, [place] [facility] [place], [place] Brynda Peon, NP [place] Lee, NP Karle Plumber, PharmD   Please be sure to bring in all your medications bottles to every appointment.   Need to Contact us:  If you have any questions or concerns before your next appointment please send [place] a message through [place] or call our office at [phone].    TO LEAVE A MESSAGE FOR THE NURSE SELECT OPTION 2, PLEASE LEAVE A MESSAGE INCLUDING: YOUR NAME DATE OF BIRTH CALL BACK NUMBER REASON FOR CALL**this is important as we prioritize the call backs  YOU WILL RECEIVE A CALL BACK THE SAME DAY AS LONG AS YOU CALL BEFORE 4:00 PM

## 2023-11-25 DIAGNOSIS — N760 Acute vaginitis: Secondary | ICD-10-CM | POA: Diagnosis not present

## 2023-11-25 DIAGNOSIS — N926 Irregular menstruation, unspecified: Secondary | ICD-10-CM | POA: Diagnosis not present

## 2023-12-02 ENCOUNTER — Telehealth (HOSPITAL_COMMUNITY): Payer: Self-pay | Admitting: Cardiology

## 2023-12-02 MED ORDER — METOPROLOL SUCCINATE ER 25 MG PO TB24: 25.0000 mg | ORAL_TABLET | Freq: Every evening | ORAL | 3 refills | Status: DC

## 2023-12-02 MED ORDER — ENTRESTO 24-26 MG PO TABS: 1.0000 | ORAL_TABLET | Freq: Two times a day (BID) | ORAL | 11 refills | Status: DC

## 2023-12-02 NOTE — Telephone Encounter (Signed)
 Pt called to report side effect of new medications entresto  and metoprolol   Reports severe fatigue, reports before medications she was very active since starting she will sit around all day -no vitals  Reports muscle cramps and increase in SOB while speaking unsure if this is related as well   Please advise

## 2023-12-02 NOTE — Telephone Encounter (Signed)
 Pt aware and voiced understanding

## 2023-12-04 ENCOUNTER — Inpatient Hospital Stay: Payer: BC Managed Care – PPO

## 2023-12-04 ENCOUNTER — Telehealth: Payer: Self-pay | Admitting: *Deleted

## 2023-12-04 ENCOUNTER — Inpatient Hospital Stay: Payer: BC Managed Care – PPO | Attending: Hematology and Oncology

## 2023-12-04 ENCOUNTER — Inpatient Hospital Stay: Payer: BC Managed Care – PPO | Admitting: Hematology and Oncology

## 2023-12-04 VITALS — BP 128/95 | HR 72 | Temp 97.6°F | Resp 17 | Ht 65.0 in | Wt 177.0 lb

## 2023-12-04 DIAGNOSIS — Z1721 Progesterone receptor positive status: Secondary | ICD-10-CM | POA: Insufficient documentation

## 2023-12-04 DIAGNOSIS — Z923 Personal history of irradiation: Secondary | ICD-10-CM | POA: Insufficient documentation

## 2023-12-04 DIAGNOSIS — C50919 Malignant neoplasm of unspecified site of unspecified female breast: Secondary | ICD-10-CM

## 2023-12-04 DIAGNOSIS — C50911 Malignant neoplasm of unspecified site of right female breast: Secondary | ICD-10-CM | POA: Diagnosis not present

## 2023-12-04 DIAGNOSIS — Z79899 Other long term (current) drug therapy: Secondary | ICD-10-CM | POA: Diagnosis not present

## 2023-12-04 DIAGNOSIS — Z87891 Personal history of nicotine dependence: Secondary | ICD-10-CM | POA: Insufficient documentation

## 2023-12-04 DIAGNOSIS — Z5112 Encounter for antineoplastic immunotherapy: Secondary | ICD-10-CM | POA: Diagnosis not present

## 2023-12-04 DIAGNOSIS — C787 Secondary malignant neoplasm of liver and intrahepatic bile duct: Secondary | ICD-10-CM | POA: Diagnosis not present

## 2023-12-04 DIAGNOSIS — K769 Liver disease, unspecified: Secondary | ICD-10-CM

## 2023-12-04 DIAGNOSIS — Z170 Estrogen receptor positive status [ER+]: Secondary | ICD-10-CM | POA: Insufficient documentation

## 2023-12-04 DIAGNOSIS — Z95828 Presence of other vascular implants and grafts: Secondary | ICD-10-CM

## 2023-12-04 DIAGNOSIS — Z79810 Long term (current) use of selective estrogen receptor modulators (SERMs): Secondary | ICD-10-CM | POA: Insufficient documentation

## 2023-12-04 LAB — CMP (CANCER CENTER ONLY)
ALT: 12 U/L (ref 0–44)
AST: 10 U/L — ABNORMAL LOW (ref 15–41)
Albumin: 4.1 g/dL (ref 3.5–5.0)
Alkaline Phosphatase: 95 U/L (ref 38–126)
Anion gap: 6 (ref 5–15)
BUN: 14 mg/dL (ref 6–20)
CO2: 27 mmol/L (ref 22–32)
Calcium: 9 mg/dL (ref 8.9–10.3)
Chloride: 105 mmol/L (ref 98–111)
Creatinine: 0.69 mg/dL (ref 0.44–1.00)
GFR, Estimated: >60 mL/min (ref >60)
Glucose, Bld: 99 mg/dL (ref 70–99)
Potassium: 3.9 mmol/L (ref 3.5–5.1)
Sodium: 138 mmol/L (ref 135–145)
Total Bilirubin: 0.5 mg/dL (ref 0.0–1.2)
Total Protein: 6.9 g/dL (ref 6.5–8.1)

## 2023-12-04 LAB — CBC WITH DIFFERENTIAL (CANCER CENTER ONLY)
Abs Immature Granulocytes: 0.02 10*3/uL (ref 0.00–0.07)
Basophils Absolute: 0 10*3/uL (ref 0.0–0.1)
Basophils Relative: 1 %
Eosinophils Absolute: 0.2 10*3/uL (ref 0.0–0.5)
Eosinophils Relative: 2 %
HCT: 40.6 % (ref 36.0–46.0)
Hemoglobin: 13.6 g/dL (ref 12.0–15.0)
Immature Granulocytes: 0 %
Lymphocytes Relative: 22 %
Lymphs Abs: 1.8 10*3/uL (ref 0.7–4.0)
MCH: 29.3 pg (ref 26.0–34.0)
MCHC: 33.5 g/dL (ref 30.0–36.0)
MCV: 87.5 fL (ref 80.0–100.0)
Monocytes Absolute: 0.5 10*3/uL (ref 0.1–1.0)
Monocytes Relative: 7 %
Neutro Abs: 5.5 10*3/uL (ref 1.7–7.7)
Neutrophils Relative %: 68 %
Platelet Count: 223 10*3/uL (ref 150–400)
RBC: 4.64 MIL/uL (ref 3.87–5.11)
RDW: 14.5 % (ref 11.5–15.5)
WBC Count: 8 10*3/uL (ref 4.0–10.5)
nRBC: 0 % (ref 0.0–0.2)

## 2023-12-04 LAB — PREGNANCY, URINE: Preg Test, Ur: NEGATIVE

## 2023-12-04 MED ORDER — PERTUZ-TRASTUZ-HYALURON-ZZXF 60-60-2000 MG-MG-U/ML CHEMO ~~LOC~~ SOLN
10.0000 mL | Freq: Once | SUBCUTANEOUS | Status: AC
  Administered 2023-12-04: 10 mL via SUBCUTANEOUS
  Filled 2023-12-04: qty 10

## 2023-12-04 MED ORDER — SODIUM CHLORIDE 0.9% FLUSH
10.0000 mL | Freq: Once | INTRAVENOUS | Status: AC
  Administered 2023-12-04: 10 mL

## 2023-12-04 MED ORDER — ACETAMINOPHEN 325 MG PO TABS
650.0000 mg | ORAL_TABLET | Freq: Once | ORAL | Status: AC
Start: 2023-12-04 — End: 2023-12-04
  Administered 2023-12-04: 650 mg via ORAL
  Filled 2023-12-04: qty 2

## 2023-12-04 MED ORDER — DIPHENHYDRAMINE HCL 25 MG PO CAPS
50.0000 mg | ORAL_CAPSULE | Freq: Once | ORAL | Status: AC
Start: 2023-12-04 — End: 2023-12-04
  Administered 2023-12-04: 50 mg via ORAL
  Filled 2023-12-04: qty 2

## 2023-12-04 NOTE — Telephone Encounter (Signed)
-----   Message from [place] H sent at 12/04/2023  1:08 PM EST ----- Approved. ----- Message ----- From: Vita Rudean CROME, LPN Sent: 8/0/7974  10:17 AM EST To: Rudean CROME Vita, LPN; Mazie Boyer, RN; #  Good morning Daphne, Dr.Gudena was seeing if we can get a pa for scan. He said that he put it in a while ago but I'm not sure if pa was requested to you all for it. He wants her to have it on the 13th. Please let me know as soon as you can please. Thk you

## 2023-12-04 NOTE — Progress Notes (Signed)
 Patient Care Team: Joann Slater, [place] as PCP - General (Family Medicine) Joann Nanetta SAILOR, RN as Oncology Nurse Navigator Joann Stephane BROCKS, RN as Oncology Nurse Navigator Joann Potts, [place] as Consulting Physician (Hematology and Oncology) Joann [place] as Technician  DIAGNOSIS:  Encounter Diagnosis  Name Primary?   Carcinoma of breast metastatic to liver, unspecified laterality (HCC) Yes    SUMMARY OF ONCOLOGIC HISTORY: Oncology History  Breast cancer metastasized to liver (HCC)  01/25/2019 Initial Diagnosis   Screening mammogram showed bilateral calcifications right breast 5 mm, pleomorphic.  Left breast 1.6 cm punctate, smaller group of calcifications spanning 3 mm.  Right lower outer quadrant biopsy revealed low-grade DCIS ER 90%, PR 70%; biopsy of left breast calcifications ALH and PASH   01/31/2019 Genetic Testing   ATM c.8495G>A and RECQL4 c.2587G>A VUS identified on the 9-gene STAT panel and Multi-cancer panel through [facility].  The STAT Breast cancer panel offered by [facility] includes sequencing and rearrangement analysis for the following 9 genes:  ATM, BRCA1, BRCA2, CDH1, CHEK2, PALB2, PTEN, STK11 and TP53.   The Multi-Gene Panel offered by [facility] includes sequencing and/or deletion duplication testing of the following 84 genes: AIP, ALK, APC, ATM, AXIN2,BAP1,  BARD1, BLM, BMPR1A, BRCA1, BRCA2, BRIP1, CASR, CDC73, CDH1, CDK4, CDKN1B, CDKN1C, CDKN2A (p14ARF), CDKN2A (p16INK4a), CEBPA, CHEK2, CTNNA1, DICER1, DIS3L2, EGFR (c.2369C>T, p.Thr790Met variant only), EPCAM (Deletion/duplication testing only), FH, FLCN, GATA2, GPC3, GREM1 (Promoter region deletion/duplication testing only), HOXB13 (c.251G>A, p.Gly84Glu), HRAS, KIT, MAX, MEN1, MET, MITF (c.952G>A, p.Glu318Lys variant only), MLH1, MSH2, MSH3, MSH6, MUTYH, NBN, NF1, NF2, NTHL1, PALB2, PDGFRA, PHOX2B, PMS2, POLD1, POLE, POT1, PRKAR1A, PTCH1, PTEN, RAD50, RAD51C, RAD51D, RB1, RECQL4, RET, RUNX1, SDHAF2, SDHA (sequence changes  only), SDHB, SDHC, SDHD, SMAD4, SMARCA4, SMARCB1, SMARCE1, STK11, SUFU, TERC, TERT, TMEM127, TP53, TSC1, TSC2, VHL, WRN and WT1.  he report date is February 02, 2019.   02/11/2019 Surgery   RT lumpectomy: DICS Intermediate grade, Margins Neg, ER 90%, PR 70%; Lt Lumpectomy: UDH   02/26/2019 -  Anti-estrogen oral therapy   Tamoxifen  20mg  daily, stopped because of depression and facial numbness   05/20/2019 - 06/17/2019 Radiation Therapy   Adjuvant XRT   08/15/2021 Relapse/Recurrence   MRI surveillance detected Right breast calcifications 5 cm, Ant biopsy: IG DCIS with necrosis, ER 40%, PR 0%, Posterior biopsy: Intermediate grade DCIS with [facility]   08/19/2022 Cancer Staging   Staging form: Breast, AJCC 8th Edition - Clinical: Stage IV (cT30mi, cNX, cM1, G3, ER: Unknown, PR: Unknown, HER2: Unknown) - Signed by Joann Potts, [place] on 02/06/2023 Histologic grading system: 3 grade system   02/14/2023 -  Chemotherapy   Patient is on Treatment Plan : BREAST DOCEtaxel  + Trastuzumab  + Pertuzumab  (THP) q21d x 8 cycles / Trastuzumab  + Pertuzumab  q21d x 4 cycles     AML (acute myeloid leukemia) (HCC)  09/1998 Initial Diagnosis   AML (acute myeloid leukemia) (HCC) treated with 2 induction regimens followed by 6 consolidation regimens, remission     CHIEF COMPLIANT: Follow-up on Phesgo   HISTORY OF PRESENT ILLNESS:   History of Present Illness   The patient, with a history of met breast cancer on Phesgo , presents with fatigue and heart flutters, which she believes are side effects of her new heart medication, Entresto  and Metoprolol . The patient reports feeling miserable and super tired, to the point of wanting to spend her days on the couch or in bed. This is a significant change from her previous energetic state. The patient also mentions experiencing a constant  heart flutter, which she was not experiencing before starting the new medication.  In addition to these symptoms, the patient reports a skin reaction  to injections, with the area remaining red and marked. She also mentions an increase in eye twitching over the past month. Despite these symptoms, the patient states that she feels good overall.         ALLERGIES:  is allergic to levaquin  [levofloxacin ], tamoxifen  citrate, and tape.  MEDICATIONS:  Current Outpatient Medications  Medication Sig Dispense Refill   acetaminophen  (TYLENOL ) 325 MG tablet Take 650 mg by mouth every 6 (six) hours as needed.     ALPRAZolam (XANAX) 0.25 MG tablet 1 tablet Orally once a day if needed for anxiety/panic (not a daily medication, avoid regular use) for 30 days     cholecalciferol (VITAMIN D3) 25 MCG (1000 UNIT) tablet Take 2,000 Units by mouth daily.     DEXAMETHASONE  PO Take by mouth. Taking day prior to treatment and day after treatment     fluticasone (FLONASE) 50 MCG/ACT nasal spray Place 1 spray into both nostrils daily.     Ibuprofen  (ADVIL ) 200 MG CAPS Take by mouth.     KLOR-CON  M20 20 MEQ tablet TAKE 1 TABLET BY MOUTH EVERY DAY (Patient not taking: Reported on 11/21/2023) 90 tablet 1   loratadine (CLARITIN) 10 MG tablet 1 tablet Orally Once a day     metoprolol  succinate (TOPROL  XL) 25 MG 24 hr tablet Take 1 tablet (25 mg total) by mouth at bedtime. 30 tablet 3   sacubitril -valsartan  (ENTRESTO ) 24-26 MG Take 1 tablet by mouth 2 (two) times daily. 60 tablet 11   Zinc-Vitamin C (ZINC-A-COLD/VITAMIN C MT) Use as directed 1 capsule in the mouth or throat daily.     No current facility-administered medications for this visit.   Facility-Administered Medications Ordered in Other Visits  Medication Dose Route Frequency Provider Last Rate Last Admin   acetaminophen  (TYLENOL ) tablet 650 mg  650 mg Oral Once Joann Morna Pickle, NP       diphenhydrAMINE  (BENADRYL ) capsule 50 mg  50 mg Oral Once Causey, Lindsey Cornetto, NP       pertuz-trastuz-hyaluron-zzxf (PHESGO ) 600-600-20000 MG-MG-U/10ML chemo SQ injection maintenance dose 10 mL  10 mL  Subcutaneous Once Joann Potts, [place]        PHYSICAL EXAMINATION: ECOG PERFORMANCE STATUS: 1 - Symptomatic but completely ambulatory  Vitals:   12/04/23 0947 12/04/23 0949  BP: (!) 147/93 (!) 128/95  Pulse: 72   Resp: 17   Temp: 97.6 F (36.4 C)   SpO2: 100%    Filed Weights   12/04/23 0947  Weight: 177 lb (80.3 kg)    Physical Exam          (exam performed in the presence of a chaperone)  LABORATORY DATA:  I have reviewed the data as listed    Latest Ref Rng & Units 12/04/2023    9:28 AM 11/14/2023    9:10 AM 10/24/2023    7:57 AM  CMP  Glucose 70 - 99 mg/dL 99  870  95   BUN 6 - 20 mg/dL 14  21  23    Creatinine 0.44 - 1.00 mg/dL 9.30  9.28  9.28   Sodium 135 - 145 mmol/L 138  137  137   Potassium 3.5 - 5.1 mmol/L 3.9  4.1  3.8   Chloride 98 - 111 mmol/L 105  106  105   CO2 22 - 32 mmol/L 27  24  25  Calcium 8.9 - 10.3 mg/dL 9.0  9.0  8.9   Total Protein 6.5 - 8.1 g/dL 6.9  6.8  6.7   Total Bilirubin 0.0 - 1.2 mg/dL 0.5  0.4  0.3   Alkaline Phos 38 - 126 U/L 95  106  114   AST 15 - 41 U/L 10  17  13    ALT 0 - 44 U/L 12  26  15      Lab Results  Component Value Date   WBC 8.0 12/04/2023   HGB 13.6 12/04/2023   HCT 40.6 12/04/2023   MCV 87.5 12/04/2023   PLT 223 12/04/2023   NEUTROABS 5.5 12/04/2023    ASSESSMENT & PLAN:  Breast cancer metastasized to liver (HCC) 02/11/19: RT lumpectomy: DICS Intermediate grade, Margins Neg, ER 90%, PR 70%; Lt Lumpectomy: UDH status post radiation, could not tolerate tamoxifen  recurrence: 08/15/2021:MRI surveillance detected Right breast calcifications 5 cm, Ant biopsy: IG DCIS with necrosis, ER 40%, PR 0%, Posterior biopsy: Intermediate grade DCIS with [facility]  11/29/2021: Right mastectomy: High-grade DCIS with microinvasion, ER 0%, PR 0%, HER2 positive, 0/1 lymph node negative  T1c MIC, N0: Stage Ia   Patient had second opinion at [facility] with Dr. Eleanora who agreed with her plan of no systemic chemotherapy   Breast cancer  surveillance: 1.  Mammogram 08/22/2022: Left breast benign breast density category B  2.  CT CAP 12/09/2022: Multiple liver lesions indicative of cysts and hemangiomas new right hepatic lobe lesion favored to be hemangioma otherwise no evidence of metastatic disease.  Radiology recommended an MRI of the liver. 3.  Liver MRI 01/08/2023: 2 New rim-enhancing lesions 2.3 cm (was 1.6 cm on 12/09/2022), 1.8 cm (measured 1.5 cm), benign hemangioma 2.4 cm and 1.9 cm and 1.3 cm unchanged 4.  PET CT scan 01/22/2023: Liver: 2.5 cm and 2.4 cm lesions ------------------------------------------------------------------------------------------------------------------------------------------------- 01/31/2023: Liver lesion biopsy: Metastatic adenocarcinoma to the liver consistent with breast primary positive for CK7 and GATA3, ER 0%, PR 0%, Ki-67 25%, HER2 3+ positive, Caris molecular testing is pending   Current treatment: Completed 6 cycles of Taxotere  Herceptin  Perjeta  on 05/30/2023, currently on maintenance Herceptin  and Perjeta  Herceptin  Perjeta  toxicities: Tolerating it extremely well. She saw Dr. Burnard Eleanora at [facility] for second opinion who agreed with the treatment plan.   Meningioma: Follows with Dr. Buckley, brain MRI  08/04/2023: Stable PET/CT: 06/19/2023: Interval resolution of the hypermetabolic lesions in the liver. PET/CT 10/03/2023: No evidence of recurrent/metastatic cancer low-density liver lesions less conspicuous without any hypermetabolic activity   Scans will be done in February. Recommendation:Phesgo .  Every 3 weeks ------------------------------------- Assessment and Plan    Cardiomyopathy New onset fatigue and palpitations since starting Entresto  and Metoprolol . Dose of Entresto  has been reduced and timing of Metoprolol  has been changed to nighttime by the cardiologist. Patient to follow up with cardiologist on 12/22/2023. --Track blood pressure   Cancer surveillance Patient is currently in  remission. She is experiencing skin redness from Phesgo  injections. No other significant side effects reported. -Continue Phesgo  injections as tolerated. -Order Gardant Reveal blood test for additional cancer surveillance. -Schedule CT scan for 01/08/2024 for routine cancer surveillance.  Electrolyte monitoring Patient has a history of potassium imbalance. Current potassium level is pending. -Await results of potassium level. Adjust treatment as necessary based on results.          Orders Placed This Encounter  Procedures   CBC with Differential (Cancer Center Only)    Standing Status:   Future    Expiration  Date:   12/03/2024   CMP (Cancer Center only)    Standing Status:   Future    Expiration Date:   12/03/2024   The patient has a good understanding of the overall plan. she agrees with it. she will call with any problems that may develop before the next visit here. Total time spent: 30 mins including face to face time and time spent for planning, charting and co-ordination of care   Joann MARLA Chad, [place] 12/04/23

## 2023-12-04 NOTE — Patient Instructions (Signed)
 [facility] - A DEPT OF [facility]  Discharge Instructions: Thank you for choosing [facility] to provide your oncology and hematology care.   If you have a lab appointment with the Cancer Center, please go directly to the Cancer Center and check in at the registration area.   Wear comfortable clothing and clothing appropriate for easy access to any Portacath or PICC line.   We strive to give you quality time with your provider. You may need to reschedule your appointment if you arrive late (15 or more minutes).  Arriving late affects you and other patients whose appointments are after yours.  Also, if you miss three or more appointments without notifying the office, you may be dismissed from the clinic at the provider's discretion.      For prescription refill requests, have your pharmacy contact our office and allow 72 hours for refills to be completed.    Today you received the following chemotherapy and/or immunotherapy agents: Phesgo.      To help prevent nausea and vomiting after your treatment, we encourage you to take your nausea medication as directed.  BELOW ARE SYMPTOMS THAT SHOULD BE REPORTED IMMEDIATELY: *FEVER GREATER THAN 100.4 F (38 C) OR HIGHER *CHILLS OR SWEATING *NAUSEA AND VOMITING THAT IS NOT CONTROLLED WITH YOUR NAUSEA MEDICATION *UNUSUAL SHORTNESS OF BREATH *UNUSUAL BRUISING OR BLEEDING *URINARY PROBLEMS (pain or burning when urinating, or frequent urination) *BOWEL PROBLEMS (unusual diarrhea, constipation, pain near the anus) TENDERNESS IN MOUTH AND THROAT WITH OR WITHOUT PRESENCE OF ULCERS (sore throat, sores in mouth, or a toothache) UNUSUAL RASH, SWELLING OR PAIN  UNUSUAL VAGINAL DISCHARGE OR ITCHING   Items with * indicate a potential emergency and should be followed up as soon as possible or go to the Emergency Department if any problems should occur.  Please show the CHEMOTHERAPY ALERT CARD or IMMUNOTHERAPY  ALERT CARD at check-in to the Emergency Department and triage nurse.  Should you have questions after your visit or need to cancel or reschedule your appointment, please contact [facility] - A DEPT OF [facility]  Dept: [phone]  and follow the prompts.  Office hours are 8:00 a.m. to 4:30 p.m. Monday - Friday. Please note that voicemails left after 4:00 p.m. may not be returned until the following business day.  We are closed weekends and major holidays. You have access to a nurse at all times for urgent questions. Please call the main number to the clinic Dept: [phone] and follow the prompts.   For any non-urgent questions, you may also contact your provider using MyChart. We now offer e-Visits for anyone 2 and older to request care online for non-urgent symptoms. For details visit [email or web address].   Also download the MyChart app! Go to the app store, search "MyChart", open the app, select [place], and log in with your MyChart username and password.

## 2023-12-04 NOTE — Assessment & Plan Note (Signed)
 02/11/19: RT lumpectomy: DICS Intermediate grade, Margins Neg, ER 90%, PR 70%; Lt Lumpectomy: UDH status post radiation, could not tolerate tamoxifen  recurrence: 08/15/2021:MRI surveillance detected Right breast calcifications 5 cm, Ant biopsy: IG DCIS with necrosis, ER 40%, PR 0%, Posterior biopsy: Intermediate grade DCIS with [facility]  11/29/2021: Right mastectomy: High-grade DCIS with microinvasion, ER 0%, PR 0%, HER2 positive, 0/1 lymph node negative  T1c MIC, N0: Stage Ia   Patient had second opinion at [facility] with Dr. Eleanora who agreed with her plan of no systemic chemotherapy   Breast cancer surveillance: 1.  Mammogram 08/22/2022: Left breast benign breast density category B  2.  CT CAP 12/09/2022: Multiple liver lesions indicative of cysts and hemangiomas new right hepatic lobe lesion favored to be hemangioma otherwise no evidence of metastatic disease.  Radiology recommended an MRI of the liver. 3.  Liver MRI 01/08/2023: 2 New rim-enhancing lesions 2.3 cm (was 1.6 cm on 12/09/2022), 1.8 cm (measured 1.5 cm), benign hemangioma 2.4 cm and 1.9 cm and 1.3 cm unchanged 4.  PET CT scan 01/22/2023: Liver: 2.5 cm and 2.4 cm lesions ------------------------------------------------------------------------------------------------------------------------------------------------- 01/31/2023: Liver lesion biopsy: Metastatic adenocarcinoma to the liver consistent with breast primary positive for CK7 and GATA3, ER 0%, PR 0%, Ki-67 25%, HER2 3+ positive, Caris molecular testing is pending   Current treatment: Completed 6 cycles of Taxotere  Herceptin  Perjeta  on 05/30/2023, currently on maintenance Herceptin  and Perjeta  Herceptin  Perjeta  toxicities: Tolerating it extremely well. She saw Dr. Burnard Eleanora at [facility] for second opinion who agreed with the treatment plan.   Meningioma: Follows with Dr. Buckley, brain MRI  08/04/2023: Stable PET/CT: 06/19/2023: Interval resolution of the hypermetabolic lesions in the liver. PET/CT  10/03/2023: No evidence of recurrent/metastatic cancer low-density liver lesions less conspicuous without any hypermetabolic activity   Scans will be done in February. Recommendation:Phesgo .  Every 3 weeks

## 2023-12-05 ENCOUNTER — Ambulatory Visit: Payer: BC Managed Care – PPO

## 2023-12-05 ENCOUNTER — Ambulatory Visit: Payer: BC Managed Care – PPO | Admitting: Hematology and Oncology

## 2023-12-05 ENCOUNTER — Other Ambulatory Visit: Payer: BC Managed Care – PPO

## 2023-12-08 ENCOUNTER — Ambulatory Visit (HOSPITAL_COMMUNITY): Payer: BC Managed Care – PPO

## 2023-12-09 DIAGNOSIS — N760 Acute vaginitis: Secondary | ICD-10-CM | POA: Diagnosis not present

## 2023-12-09 DIAGNOSIS — N926 Irregular menstruation, unspecified: Secondary | ICD-10-CM | POA: Diagnosis not present

## 2023-12-10 ENCOUNTER — Telehealth: Payer: Self-pay

## 2023-12-10 NOTE — Telephone Encounter (Signed)
 Per md orders entered for Guardant Reveal and all supported documents faxed to [phone]. Faxed confirmation was received.

## 2023-12-11 DIAGNOSIS — J069 Acute upper respiratory infection, unspecified: Secondary | ICD-10-CM | POA: Diagnosis not present

## 2023-12-11 DIAGNOSIS — Z03818 Encounter for observation for suspected exposure to other biological agents ruled out: Secondary | ICD-10-CM | POA: Diagnosis not present

## 2023-12-17 ENCOUNTER — Encounter: Payer: Self-pay | Admitting: Hematology and Oncology

## 2023-12-22 ENCOUNTER — Ambulatory Visit (HOSPITAL_COMMUNITY)
Admission: RE | Admit: 2023-12-22 | Discharge: 2023-12-22 | Disposition: A | Discharge status: Home / Self Care | Payer: BC Managed Care – PPO | Source: Ambulatory Visit | Attending: Internal Medicine | Admitting: Internal Medicine

## 2023-12-22 VITALS — BP 138/90 | HR 79 | Wt 179.4 lb

## 2023-12-22 DIAGNOSIS — Z923 Personal history of irradiation: Secondary | ICD-10-CM | POA: Insufficient documentation

## 2023-12-22 DIAGNOSIS — C50911 Malignant neoplasm of unspecified site of right female breast: Secondary | ICD-10-CM | POA: Insufficient documentation

## 2023-12-22 DIAGNOSIS — N939 Abnormal uterine and vaginal bleeding, unspecified: Secondary | ICD-10-CM | POA: Diagnosis not present

## 2023-12-22 DIAGNOSIS — Z170 Estrogen receptor positive status [ER+]: Secondary | ICD-10-CM | POA: Insufficient documentation

## 2023-12-22 DIAGNOSIS — Z79899 Other long term (current) drug therapy: Secondary | ICD-10-CM | POA: Diagnosis not present

## 2023-12-22 DIAGNOSIS — N920 Excessive and frequent menstruation with regular cycle: Secondary | ICD-10-CM | POA: Diagnosis not present

## 2023-12-22 DIAGNOSIS — C9201 Acute myeloblastic leukemia, in remission: Secondary | ICD-10-CM | POA: Diagnosis not present

## 2023-12-22 DIAGNOSIS — R0683 Snoring: Secondary | ICD-10-CM

## 2023-12-22 DIAGNOSIS — I10 Essential (primary) hypertension: Secondary | ICD-10-CM

## 2023-12-22 DIAGNOSIS — R0609 Other forms of dyspnea: Secondary | ICD-10-CM

## 2023-12-22 DIAGNOSIS — R5383 Other fatigue: Secondary | ICD-10-CM

## 2023-12-22 DIAGNOSIS — Z87891 Personal history of nicotine dependence: Secondary | ICD-10-CM | POA: Insufficient documentation

## 2023-12-22 DIAGNOSIS — I427 Cardiomyopathy due to drug and external agent: Secondary | ICD-10-CM

## 2023-12-22 DIAGNOSIS — Z9221 Personal history of antineoplastic chemotherapy: Secondary | ICD-10-CM | POA: Diagnosis not present

## 2023-12-22 DIAGNOSIS — T451X5A Adverse effect of antineoplastic and immunosuppressive drugs, initial encounter: Secondary | ICD-10-CM

## 2023-12-22 DIAGNOSIS — Z1721 Progesterone receptor positive status: Secondary | ICD-10-CM | POA: Insufficient documentation

## 2023-12-22 NOTE — Patient Instructions (Signed)
Great to see you today!!!  Please HOLD your Toprol XL for 3 days to see if any improvement, if not please RESTART  Your provider has recommended that you have a home sleep study (Itamar Test).  We have provided you with the equipment in our office today. Please go ahead and download the app. DO NOT OPEN OR TAMPER WITH THE BOX UNTIL WE ADVISE YOU TO DO SO. Once insurance has approved the test our office will call you with PIN number and approval to proceed with testing. Once you have completed the test you just dispose of the equipment, the information is automatically uploaded to [place] via blue-tooth technology. If your test is positive for sleep apnea and you need a home CPAP machine you will be contacted by Dr Norris Cross office [facility]) to set this up.  Your physician recommends that you schedule a follow-up appointment in: 1 month as scheduled with an echocardiogram  If you have any questions or concerns before your next appointment please send [place] a message through [place] or call our office at [phone].    TO LEAVE A MESSAGE FOR THE NURSE SELECT OPTION 2, PLEASE LEAVE A MESSAGE INCLUDING: YOUR NAME DATE OF BIRTH CALL BACK NUMBER REASON FOR CALL**this is important as we prioritize the call backs  YOU WILL RECEIVE A CALL BACK THE SAME DAY AS LONG AS YOU CALL BEFORE 4:00 PM  At the [facility], you and your health needs are our priority. As part of our continuing mission to provide you with exceptional heart care, we have created designated Provider Care Teams. These Care Teams include your primary Cardiologist (physician) and Advanced Practice Providers (APPs- Physician Assistants and Nurse Practitioners) who all work together to provide you with the care you need, when you need it.   You may see any of the following providers on your designated Care Team at your next follow up: Dr Arvilla Meres Dr Marca Ancona Dr. Dorthula Nettles Dr. Clearnce Hasten Amy  Filbert Schilder, NP Robbie Lis, [place] [facility], [place] Brynda Peon, NP [place] Lee, NP Karle Plumber, PharmD   Please be sure to bring in all your medications bottles to every appointment.    Thank you for choosing [facility]

## 2023-12-22 NOTE — Progress Notes (Signed)
Height: 5'5"    Weight: 179 lbs BMI: 29.85  Today's Date: 12/22/23  STOP BANG RISK ASSESSMENT S (snore) Have you been told that you snore?     YES   T (tired) Are you often tired, fatigued, or sleepy during the day?   YES  O (obstruction) Do you stop breathing, choke, or gasp during sleep? NO   P (pressure) Do you have or are you being treated for high blood pressure? YES   B (BMI) Is your body index greater than 35 kg/m? NO   A (age) Are you 51 years old or older? NO   N (neck) Do you have a neck circumference greater than 16 inches?      G (gender) Are you a female? NO   TOTAL STOP/BANG "YES" ANSWERS 3                                                                       For Office Use Only              Procedure Order Form    YES to 3+ Stop Bang questions OR two clinical symptoms - patient qualifies for WatchPAT (CPT 95800)      Clinical Notes: Will consult Sleep Specialist and refer for management of therapy due to patient increased risk of Sleep Apnea. Ordering a sleep study due to the following two clinical symptoms: Excessive daytime sleepiness G47.10 /  History of high blood pressure R03.0   ITAMAR home sleep study given to patient, all instructions explained, waiver signed, and CLOUDPAT registration complete.

## 2023-12-22 NOTE — Progress Notes (Signed)
CARDIO-ONCOLOGY CLINIC CONSULT NOTE  Referring Physician: Primary Care: Primary Cardiologist:  HPI:  Joann Meadows is 48 y.o. female with breast cancer referred by Dr. Pamelia Hoit for enrollment into the Cardio-Oncology program.  Treated for AML 09/1998 treated with 2 induction regimens (including doxrubicin) followed by 6 consolidation regimens, remission   Diagnosed with R breast CA DCIS in 3/20 ER/PR+  Underwent lumpectomy Treated with tamoxifen and XRT  Relapse in 9/22   9/23 found to have Stage IV CA in 3/24 started DOCEtaxel + Trastuzumab + Pertuzumab (THP) q21d x 8 cycles / Trastuzumab + Pertuzumab q21d x 4 cycles   PET/CT: 06/19/2023: Interval resolution of the hypermetabolic lesions in the liver. PET/CT 10/03/2023: No evidence of recurrent/metastatic cancer low-density liver lesions less conspicuous without any hypermetabolic activity   ECHO:  7/24 EF 55-60% GLS -18.0  ECHO 11/24 50-55% GLS -13.4 (poor tracking  Here for f/u. Says since starting meds has been extremely fatigued. BP at home has been 130s. No swelling. Falls asleep easily. Doesn't know if she snores. Called clinic due to fatigue and Entresto decreased from 49/51 to 24/26 bid. Helped a little bit. Started having heavy menstrual bleeding.   POCUS ECHO 11/21/23 EF 50-55%    Past Medical History:  Diagnosis Date   Breast cancer (HCC) 2020   Right Breast Cancer   Endometriosis    Fibroid    Hx of radiation therapy 06/2019   Leukemia (HCC)    20 years ago   Personal history of radiation therapy 2020   Right Breast Cancer   PONV (postoperative nausea and vomiting)    pt states she vomits after colonoscopies    Current Outpatient Medications  Medication Sig Dispense Refill   acetaminophen (TYLENOL) 325 MG tablet Take 650 mg by mouth every 6 (six) hours as needed.     ALPRAZolam (XANAX) 0.25 MG tablet 1 tablet Orally once a day if needed for anxiety/panic (not a daily medication, avoid regular use) for 30 days      cholecalciferol (VITAMIN D3) 25 MCG (1000 UNIT) tablet Take 2,000 Units by mouth daily.     DEXAMETHASONE PO Take by mouth. Taking day prior to treatment and day after treatment     fluticasone (FLONASE) 50 MCG/ACT nasal spray Place 1 spray into both nostrils daily.     Ibuprofen (ADVIL) 200 MG CAPS Take 1 capsule by mouth as needed.     loratadine (CLARITIN) 10 MG tablet 1 tablet Orally Once a day     metoprolol succinate (TOPROL XL) 25 MG 24 hr tablet Take 1 tablet (25 mg total) by mouth at bedtime. 30 tablet 3   sacubitril-valsartan (ENTRESTO) 24-26 MG Take 1 tablet by mouth 2 (two) times daily. 60 tablet 11   Zinc-Vitamin C (ZINC-A-COLD/VITAMIN C MT) Use as directed 1 capsule in the mouth or throat daily.     No current facility-administered medications for this encounter.    Allergies  Allergen Reactions   Levaquin [Levofloxacin] Swelling    Swelling feet and hands   Tamoxifen Citrate     Other reaction(s): depression, joint pain   Tape Rash      Social History   Socioeconomic History   Marital status: Single    Spouse name: Not on file   Number of children: 2   Years of education: Not on file   Highest education level: High school graduate  Occupational History   Not on file  Tobacco Use   Smoking status: Former    Current  packs/day: 0.00    Types: Cigarettes    Start date: 01/24/2005    Quit date: 01/25/2015    Years since quitting: 8.9   Smokeless tobacco: Never  Vaping Use   Vaping status: Never Used  Substance and Sexual Activity   Alcohol use: No   Drug use: No   Sexual activity: Yes    Birth control/protection: None  Other Topics Concern   Not on file  Social History Narrative   Lives with children   Social Drivers of Health   Financial Resource Strain: Not on file  Food Insecurity: No Food Insecurity (10/03/2022)   Hunger Vital Sign    Worried About Running Out of Food in the Last Year: Never true    Ran Out of Food in the Last Year: Never true   Transportation Needs: No Transportation Needs (10/03/2022)   PRAPARE - Administrator, Civil Service (Medical): No    Lack of Transportation (Non-Medical): No  Physical Activity: Not on file  Stress: Not on file  Social Connections: Not on file  Intimate Partner Violence: Not At Risk (02/26/2019)   Humiliation, Afraid, Rape, and Kick questionnaire    Fear of Current or Ex-Partner: No    Emotionally Abused: No    Physically Abused: No    Sexually Abused: No      Family History  Problem Relation Age of Onset   Colon cancer Mother 4       d. 21   Hypertension Father    Diabetes Maternal Aunt    Diabetes Maternal Uncle    Mental retardation Maternal Uncle    Diabetes Paternal Aunt    Diabetes Maternal Grandmother    Stroke Paternal Grandmother        c. 50   Leukemia Paternal Grandfather        d. 84; ?CLL   Leukemia Cousin 4       mat first cousin   Lymphoma Cousin 40       pat first cousin    Vitals:   12/22/23 1323  BP: (!) 138/90  Pulse: 79  SpO2: 97%  Weight: 81.4 kg (179 lb 6.4 oz)    PHYSICAL EXAM: General:  Well appearing. No resp difficulty HEENT: normal Neck: supple. no JVD. Carotids 2+ bilat; no bruits. No lymphadenopathy or thryomegaly appreciated. Cor: PMI nondisplaced. Regular rate & rhythm. No rubs, gallops or murmurs. Lungs: clear Abdomen: soft, nontender, nondistended. No hepatosplenomegaly. No bruits or masses. Good bowel sounds. Extremities: no cyanosis, clubbing, rash, edema Neuro: alert & orientedx3, cranial nerves grossly intact. moves all 4 extremities w/o difficulty. Affect pleasant   ASSESSMENT & PLAN:  1. Right Breast Cancer - stage IV - Treated for AML 09/1998 treated with 2 induction regimens (including doxrubicin) followed by 6 consolidation regimens, remission  - Diagnosed with R breast CA DCIS in 3/20 ER/PR+  Underwent lumpectomy Treated with tamoxifen and XRT - Relapse in 9/22  - 9/23 found to have Stage IV CA in 3/24  started DOCEtaxel + Trastuzumab + Pertuzumab (THP) q21d x 8 cycles / Trastuzumab + Pertuzumab q21d x 4 cycles  - plan as below  2. Potential chemotherapy-induced cardiomyopathy - LV function low end of normal range - ECHO:  7/24 EF 55-60% GLS -18.0  - ECHO 10/01/23 50-55% GLS -13.4 (poor tracking) - POCUS echo t12/27/24 in clinic EF stable, 50-55%  - NYHA Class I-II - Continue Entresto 24-26 mg bid (reduced from 49/51 bid due to fatigue) - Continue Toprol  XL 25 mg daily  - She is complaining of severe fatigue after starting low-dose b-blocker and Entresto. Entresto recently cut back without much improvement. No evidence of low BPs - At this point, I doubt fatigue is related to cardiac meds but will have her hold Toprol for a few days to see if this helps. If no change, would restart - Check Home sleeps study - Continue Herceptin/Perjeta - Schedule repeat echo   3. Hypertension  - SBPs mostly running 130s - unable to tolerate higher doses of Entresto  4. Fatigue/snoring  - check sleep study as above.   Arvilla Meres, MD  2:41 PM

## 2023-12-24 DIAGNOSIS — C50919 Malignant neoplasm of unspecified site of unspecified female breast: Secondary | ICD-10-CM | POA: Diagnosis not present

## 2023-12-24 DIAGNOSIS — N939 Abnormal uterine and vaginal bleeding, unspecified: Secondary | ICD-10-CM | POA: Diagnosis not present

## 2023-12-24 DIAGNOSIS — C787 Secondary malignant neoplasm of liver and intrahepatic bile duct: Secondary | ICD-10-CM | POA: Diagnosis not present

## 2023-12-24 DIAGNOSIS — N921 Excessive and frequent menstruation with irregular cycle: Secondary | ICD-10-CM | POA: Diagnosis not present

## 2023-12-25 ENCOUNTER — Telehealth: Payer: Self-pay | Admitting: *Deleted

## 2023-12-25 ENCOUNTER — Inpatient Hospital Stay: Payer: BC Managed Care – PPO

## 2023-12-25 ENCOUNTER — Inpatient Hospital Stay: Payer: BC Managed Care – PPO | Admitting: Adult Health

## 2023-12-25 ENCOUNTER — Encounter: Payer: Self-pay | Admitting: Adult Health

## 2023-12-25 VITALS — BP 123/80 | HR 80 | Temp 98.0°F | Resp 17 | Wt 177.7 lb

## 2023-12-25 DIAGNOSIS — Z79899 Other long term (current) drug therapy: Secondary | ICD-10-CM | POA: Diagnosis not present

## 2023-12-25 DIAGNOSIS — Z5112 Encounter for antineoplastic immunotherapy: Secondary | ICD-10-CM | POA: Diagnosis not present

## 2023-12-25 DIAGNOSIS — C50919 Malignant neoplasm of unspecified site of unspecified female breast: Secondary | ICD-10-CM | POA: Diagnosis not present

## 2023-12-25 DIAGNOSIS — Z1721 Progesterone receptor positive status: Secondary | ICD-10-CM | POA: Diagnosis not present

## 2023-12-25 DIAGNOSIS — Z79810 Long term (current) use of selective estrogen receptor modulators (SERMs): Secondary | ICD-10-CM | POA: Diagnosis not present

## 2023-12-25 DIAGNOSIS — C787 Secondary malignant neoplasm of liver and intrahepatic bile duct: Secondary | ICD-10-CM | POA: Diagnosis not present

## 2023-12-25 DIAGNOSIS — C50911 Malignant neoplasm of unspecified site of right female breast: Secondary | ICD-10-CM | POA: Diagnosis not present

## 2023-12-25 DIAGNOSIS — Z87891 Personal history of nicotine dependence: Secondary | ICD-10-CM | POA: Diagnosis not present

## 2023-12-25 DIAGNOSIS — Z923 Personal history of irradiation: Secondary | ICD-10-CM | POA: Diagnosis not present

## 2023-12-25 DIAGNOSIS — Z170 Estrogen receptor positive status [ER+]: Secondary | ICD-10-CM | POA: Diagnosis not present

## 2023-12-25 MED ORDER — PERTUZ-TRASTUZ-HYALURON-ZZXF 60-60-2000 MG-MG-U/ML CHEMO ~~LOC~~ SOLN
10.0000 mL | Freq: Once | SUBCUTANEOUS | Status: AC
  Administered 2023-12-25: 10 mL via SUBCUTANEOUS
  Filled 2023-12-25: qty 10

## 2023-12-25 MED ORDER — DIPHENHYDRAMINE HCL 25 MG PO CAPS
50.0000 mg | ORAL_CAPSULE | Freq: Once | ORAL | Status: AC
Start: 2023-12-25 — End: 2023-12-25
  Administered 2023-12-25: 50 mg via ORAL
  Filled 2023-12-25: qty 2

## 2023-12-25 MED ORDER — ACETAMINOPHEN 325 MG PO TABS
650.0000 mg | ORAL_TABLET | Freq: Once | ORAL | Status: AC
  Administered 2023-12-25: 650 mg via ORAL
  Filled 2023-12-25: qty 2

## 2023-12-25 NOTE — Assessment & Plan Note (Signed)
02/11/19: RT lumpectomy: DICS Intermediate grade, Margins Neg, ER 90%, PR 70%; Lt Lumpectomy: UDH status post radiation, could not tolerate tamoxifen recurrence: 08/15/2021:MRI surveillance detected Right breast calcifications 5 cm, Ant biopsy: IG DCIS with necrosis, ER 40%, PR 0%, Posterior biopsy: Intermediate grade DCIS with [facility]  11/29/2021: Right mastectomy: High-grade DCIS with microinvasion, ER 0%, PR 0%, HER2 positive, 0/1 lymph node negative  T1c MIC, N0: Stage Ia   Patient had second opinion at [facility] with Dr. Creig Hines who agreed with her plan of no systemic chemotherapy   Breast cancer surveillance: 1.  Mammogram 08/22/2022: Left breast benign breast density category B  2.  CT CAP 12/09/2022: Multiple liver lesions indicative of cysts and hemangiomas new right hepatic lobe lesion favored to be hemangioma otherwise no evidence of metastatic disease.  Radiology recommended an MRI of the liver. 3.  Liver MRI 01/08/2023: 2 New rim-enhancing lesions 2.3 cm (was 1.6 cm on 12/09/2022), 1.8 cm (measured 1.5 cm), benign hemangioma 2.4 cm and 1.9 cm and 1.3 cm unchanged 4.  PET CT scan 01/22/2023: Liver: 2.5 cm and 2.4 cm lesions ------------------------------------------------------------------------------------------------------------------------------------------------- 01/31/2023: Liver lesion biopsy: Metastatic adenocarcinoma to the liver consistent with breast primary positive for CK7 and GATA3, ER 0%, PR 0%, Ki-67 25%, HER2 3+ positive, Caris molecular testing is pending   Current treatment: Completed 6 cycles of Taxotere Herceptin Perjeta on 05/30/2023, currently on maintenance Herceptin and Perjeta with PHESGO Herceptin Perjeta toxicities: Tolerating it extremely well. She saw Dr. Roland Earl at [facility] for second opinion who agreed with the treatment plan. Meningioma: Follows with Dr. Barbaraann Cao, brain MRI  08/04/2023: Stable PET/CT: 06/19/2023: Interval resolution of the hypermetabolic lesions in the  liver. PET/CT 10/03/2023: No evidence of recurrent/metastatic cancer low-density liver lesions less conspicuous without any hypermetabolic activity Recommendation:Phesgo.  Every 3 weeks  Stage 4 Cancer Patient is currently asymptomatic with negative GARDENT 360 test. Patient expresses confusion and concern about the lack of active disease despite being stage 4. Discussed the possibility of subtle, undetectable disease presence and the uncertainty of disease progression. -Continue current treatment regimen. -CT scans 01/08/2024 -Plan for follow-up with Dr. Georgiann Mohs 01/15/2024 after next scan.  Diarrhea Patient reports frequent bowel movements, especially in the first week after treatment, and loose stools. Discussed that this is a common side effect of the treatment. -Take Imodium as needed, especially before diarrhea becomes severe.  Fatigue Patient reports significant fatigue since starting new heart medication (Entresto and Metoprolol) on December 27. Cardiologist suggested suspending one of the medications for a few days to see if energy improves. Patient reports feeling better since stopping Metoprolol. -Stay off Metoprolol until further guidance from cardiologist (Dr. Gala Romney is aware). -Plan for home sleep study to evaluate for sleep apnea as a potential cause of fatigue.  Hypertension Managed with Entresto. Patient reports normal blood pressure readings. -Continue Entresto as prescribed.  Port Maintenance Patient's port does not require flushing at this visit. -Plan for port flush at next visit on February 20.

## 2023-12-25 NOTE — Patient Instructions (Signed)
[facility] - A DEPT OF [facility]  Discharge Instructions: Thank you for choosing [facility] to provide your oncology and hematology care.   If you have a lab appointment with the Cancer Center, please go directly to the Cancer Center and check in at the registration area.   Wear comfortable clothing and clothing appropriate for easy access to any Portacath or PICC line.   We strive to give you quality time with your provider. You may need to reschedule your appointment if you arrive late (15 or more minutes).  Arriving late affects you and other patients whose appointments are after yours.  Also, if you miss three or more appointments without notifying the office, you may be dismissed from the clinic at the provider's discretion.      For prescription refill requests, have your pharmacy contact our office and allow 72 hours for refills to be completed.    Today you received the following chemotherapy and/or immunotherapy agents: Phesgo.      To help prevent nausea and vomiting after your treatment, we encourage you to take your nausea medication as directed.  BELOW ARE SYMPTOMS THAT SHOULD BE REPORTED IMMEDIATELY: *FEVER GREATER THAN 100.4 F (38 C) OR HIGHER *CHILLS OR SWEATING *NAUSEA AND VOMITING THAT IS NOT CONTROLLED WITH YOUR NAUSEA MEDICATION *UNUSUAL SHORTNESS OF BREATH *UNUSUAL BRUISING OR BLEEDING *URINARY PROBLEMS (pain or burning when urinating, or frequent urination) *BOWEL PROBLEMS (unusual diarrhea, constipation, pain near the anus) TENDERNESS IN MOUTH AND THROAT WITH OR WITHOUT PRESENCE OF ULCERS (sore throat, sores in mouth, or a toothache) UNUSUAL RASH, SWELLING OR PAIN  UNUSUAL VAGINAL DISCHARGE OR ITCHING   Items with * indicate a potential emergency and should be followed up as soon as possible or go to the Emergency Department if any problems should occur.  Please show the CHEMOTHERAPY ALERT CARD or IMMUNOTHERAPY  ALERT CARD at check-in to the Emergency Department and triage nurse.  Should you have questions after your visit or need to cancel or reschedule your appointment, please contact [facility] - A DEPT OF [facility]  Dept: [phone]  and follow the prompts.  Office hours are 8:00 a.m. to 4:30 p.m. Monday - Friday. Please note that voicemails left after 4:00 p.m. may not be returned until the following business day.  We are closed weekends and major holidays. You have access to a nurse at all times for urgent questions. Please call the main number to the clinic Dept: [phone] and follow the prompts.   For any non-urgent questions, you may also contact your provider using MyChart. We now offer e-Visits for anyone 2 and older to request care online for non-urgent symptoms. For details visit [email or web address].   Also download the MyChart app! Go to the app store, search "MyChart", open the app, select [place], and log in with your MyChart username and password.

## 2023-12-25 NOTE — Progress Notes (Signed)
[facility] Cancer Follow up:    [name], MD [address] [place] [address]   DIAGNOSIS:  Cancer Staging  Breast cancer metastasized to liver [facility]) Staging form: Breast, AJCC 8th Edition - Clinical: Stage IV (cT36mi, cNX, cM1, G3, ER: Unknown, PR: Unknown, HER2: Unknown) - Signed by Serena Croissant, MD on 02/06/2023 Histologic grading system: 3 grade system   SUMMARY OF ONCOLOGIC HISTORY: Oncology History  Breast cancer metastasized to liver (HCC)  01/25/2019 Initial Diagnosis   Screening mammogram showed bilateral calcifications right breast 5 mm, pleomorphic.  Left breast 1.6 cm punctate, smaller group of calcifications spanning 3 mm.  Right lower outer quadrant biopsy revealed low-grade DCIS ER 90%, PR 70%; biopsy of left breast calcifications ALH and PASH   01/31/2019 Genetic Testing   ATM c.8495G>A and RECQL4 c.2587G>A VUS identified on the 9-gene STAT panel and Multi-cancer panel through [facility].  The STAT Breast cancer panel offered by [facility] includes sequencing and rearrangement analysis for the following 9 genes:  ATM, BRCA1, BRCA2, CDH1, CHEK2, PALB2, PTEN, STK11 and TP53.   The Multi-Gene Panel offered by [facility] includes sequencing and/or deletion duplication testing of the following 84 genes: AIP, ALK, APC, ATM, AXIN2,BAP1,  BARD1, BLM, BMPR1A, BRCA1, BRCA2, BRIP1, CASR, CDC73, CDH1, CDK4, CDKN1B, CDKN1C, CDKN2A (p14ARF), CDKN2A (p16INK4a), CEBPA, CHEK2, CTNNA1, DICER1, DIS3L2, EGFR (c.2369C>T, p.Thr790Met variant only), EPCAM (Deletion/duplication testing only), FH, FLCN, GATA2, GPC3, GREM1 (Promoter region deletion/duplication testing only), HOXB13 (c.251G>A, p.Gly84Glu), HRAS, KIT, MAX, MEN1, MET, MITF (c.952G>A, p.Glu318Lys variant only), MLH1, MSH2, MSH3, MSH6, MUTYH, NBN, NF1, NF2, NTHL1, PALB2, PDGFRA, PHOX2B, PMS2, POLD1, POLE, POT1, PRKAR1A, PTCH1, PTEN, RAD50, RAD51C, RAD51D, RB1, RECQL4, RET, RUNX1, SDHAF2, SDHA (sequence changes  only), SDHB, SDHC, SDHD, SMAD4, SMARCA4, SMARCB1, SMARCE1, STK11, SUFU, TERC, TERT, TMEM127, TP53, TSC1, TSC2, VHL, WRN and WT1.  he report date is February 02, 2019.   02/11/2019 Surgery   RT lumpectomy: DICS Intermediate grade, Margins Neg, ER 90%, PR 70%; Lt Lumpectomy: UDH   02/26/2019 -  Anti-estrogen oral therapy   Tamoxifen 20mg  daily, stopped because of depression and facial numbness   05/20/2019 - 06/17/2019 Radiation Therapy   Adjuvant XRT   08/15/2021 Relapse/Recurrence   MRI surveillance detected Right breast calcifications 5 cm, Ant biopsy: IG DCIS with necrosis, ER 40%, PR 0%, Posterior biopsy: Intermediate grade DCIS with [facility]   08/19/2022 Cancer Staging   Staging form: Breast, AJCC 8th Edition - Clinical: Stage IV (cT41mi, cNX, cM1, G3, ER: Unknown, PR: Unknown, HER2: Unknown) - Signed by Serena Croissant, MD on 02/06/2023 Histologic grading system: 3 grade system   02/14/2023 -  Chemotherapy   Patient is on Treatment Plan : BREAST DOCEtaxel + Trastuzumab + Pertuzumab (THP) q21d x 8 cycles / Trastuzumab + Pertuzumab q21d x 4 cycles     AML (acute myeloid leukemia) (HCC)  09/1998 Initial Diagnosis   AML (acute myeloid leukemia) (HCC) treated with 2 induction regimens followed by 6 consolidation regimens, remission     CURRENT THERAPY:Phesgo  INTERVAL HISTORY:  Discussed the use of AI scribe software for clinical note transcription with the patient, who gave verbal consent to proceed.  Joann Meadows 48 y.o. female , with a history of stage 4 cancer, presents with fatigue and bowel problems. They report feeling tired and sleepy all the time since starting heart medications (Entresto and Metoprolol) on December 27. She continues on Phesgo however notes frequent bowel movements, especially in the first week after treatment, and loose stools.  Despite these symptoms, they are trying to adjust and can tolerate them.  The patient also mentions a recent visit to the cardiologist due to the  onset of fatigue coinciding with the start of the heart medications. The cardiologist suggested suspending one of the medications for a few days to see if the patient's energy levels would increase. The patient reports feeling a bit better since stopping the medication.  The patient also mentions having sleep problems and is scheduled for a home sleep study. They express confusion and concern about their cancer status, as they are stage 4 but currently have no active disease.   Patient Active Problem List   Diagnosis Date Noted   Other specified disorders of eustachian tube, bilateral 09/05/2023   Chronic rhinitis 09/05/2023   Swimmer's ear of right side 09/03/2023   Port-A-Cath in place 03/07/2023   Meningioma (HCC) 01/30/2023   Liver lesion 01/23/2023   COPD possible, would be Gold Group B 11/25/2019   DOE (dyspnea on exertion) 11/24/2019   Encounter for antineoplastic chemotherapy 02/02/2019   Breast cancer metastasized to liver (HCC) 01/25/2019   AML (acute myeloid leukemia) (HCC) 01/25/2019   Family history of colon cancer    Family history of leukemia    Delivery normal 03/25/2017    is allergic to levaquin [levofloxacin], tamoxifen citrate, and tape.  MEDICAL HISTORY: Past Medical History:  Diagnosis Date   Breast cancer (HCC) 2020   Right Breast Cancer   Endometriosis    Fibroid    Hx of radiation therapy 06/2019   Leukemia (HCC)    20 years ago   Personal history of radiation therapy 2020   Right Breast Cancer   PONV (postoperative nausea and vomiting)    pt states she vomits after colonoscopies    SURGICAL HISTORY: Past Surgical History:  Procedure Laterality Date   BREAST EXCISIONAL BIOPSY Left 02/11/2019   [facility]   BREAST LUMPECTOMY Right 02/11/2019   BREAST LUMPECTOMY WITH RADIOACTIVE SEED LOCALIZATION Bilateral 02/11/2019   Procedure: BILATERAL BREAST LUMPECTOMIES WITH BILATERAL RADIOACTIVE SEED LOCALIZATION;  Surgeon: Manus Rudd, MD;  Location: [facility]  [place];  Service: General;  Laterality: Bilateral;   IR IMAGING GUIDED PORT INSERTION  02/25/2023   IR RADIOLOGIST EVAL & MGMT  02/27/2023   MASTECTOMY Right 11/2021    SOCIAL HISTORY: Social History   Socioeconomic History   Marital status: Single    Spouse name: Not on file   Number of children: 2   Years of education: Not on file   Highest education level: High school graduate  Occupational History   Not on file  Tobacco Use   Smoking status: Former    Current packs/day: 0.00    Types: Cigarettes    Start date: 01/24/2005    Quit date: 01/25/2015    Years since quitting: 8.9   Smokeless tobacco: Never  Vaping Use   Vaping status: Never Used  Substance and Sexual Activity   Alcohol use: No   Drug use: No   Sexual activity: Yes    Birth control/protection: None  Other Topics Concern   Not on file  Social History Narrative   Lives with children   Social Drivers of Health   Financial Resource Strain: Not on file  Food Insecurity: No Food Insecurity (10/03/2022)   Hunger Vital Sign    Worried About Running Out of Food in the Last Year: Never true    Ran Out of Food in the Last Year: Never true  Transportation Needs:  No Transportation Needs (10/03/2022)   PRAPARE - Administrator, Civil Service (Medical): No    Lack of Transportation (Non-Medical): No  Physical Activity: Not on file  Stress: Not on file  Social Connections: Not on file  Intimate Partner Violence: Not At Risk (02/26/2019)   Humiliation, Afraid, Rape, and Kick questionnaire    Fear of Current or Ex-Partner: No    Emotionally Abused: No    Physically Abused: No    Sexually Abused: No    FAMILY HISTORY: Family History  Problem Relation Age of Onset   Colon cancer Mother 64       d. 48   Hypertension Father    Diabetes Maternal Aunt    Diabetes Maternal Uncle    Mental retardation Maternal Uncle    Diabetes Paternal Aunt    Diabetes Maternal Grandmother    Stroke Paternal  Grandmother        c. 64   Leukemia Paternal Grandfather        d. 19; ?CLL   Leukemia Cousin 4       mat first cousin   Lymphoma Cousin 40       pat first cousin    Review of Systems  Constitutional:  Negative for appetite change, chills, fatigue, fever and unexpected weight change.  HENT:   Negative for hearing loss, lump/mass and trouble swallowing.   Eyes:  Negative for eye problems and icterus.  Respiratory:  Negative for chest tightness, cough and shortness of breath.   Cardiovascular:  Negative for chest pain, leg swelling and palpitations.  Gastrointestinal:  Positive for diarrhea. Negative for abdominal distention, abdominal pain, blood in stool, constipation, nausea, rectal pain and vomiting.  Endocrine: Negative for hot flashes.  Genitourinary:  Negative for difficulty urinating.   Musculoskeletal:  Negative for arthralgias.  Skin:  Negative for itching and rash.  Neurological:  Negative for dizziness, extremity weakness, headaches and numbness.  Hematological:  Negative for adenopathy. Does not bruise/bleed easily.  Psychiatric/Behavioral:  Negative for depression. The patient is not nervous/anxious.       PHYSICAL EXAMINATION    Vitals:   12/25/23 0938  BP: 123/80  Pulse: 80  Resp: 17  Temp: 98 F (36.7 C)  SpO2: 99%    Physical Exam Constitutional:      General: She is not in acute distress.    Appearance: Normal appearance. She is not toxic-appearing.  HENT:     Head: Normocephalic and atraumatic.     Mouth/Throat:     Mouth: Mucous membranes are moist.     Pharynx: Oropharynx is clear. No oropharyngeal exudate or posterior oropharyngeal erythema.  Eyes:     General: No scleral icterus. Cardiovascular:     Rate and Rhythm: Normal rate and regular rhythm.     Pulses: Normal pulses.     Heart sounds: Normal heart sounds.  Pulmonary:     Effort: Pulmonary effort is normal.     Breath sounds: Normal breath sounds.  Abdominal:     General: Abdomen  is flat. Bowel sounds are normal. There is no distension.     Palpations: Abdomen is soft.     Tenderness: There is no abdominal tenderness.  Musculoskeletal:        General: No swelling.     Cervical back: Neck supple.  Lymphadenopathy:     Cervical: No cervical adenopathy.  Skin:    General: Skin is warm and dry.     Findings: No rash.  Neurological:  General: No focal deficit present.     Mental Status: She is alert.  Psychiatric:        Mood and Affect: Mood normal.        Behavior: Behavior normal.     LABORATORY DATA:  CBC    Component Value Date/Time   WBC 8.0 12/04/2023 0928   WBC 2.7 (L) 02/26/2023 0001   RBC 4.64 12/04/2023 0928   HGB 13.6 12/04/2023 0928   HCT 40.6 12/04/2023 0928   PLT 223 12/04/2023 0928   MCV 87.5 12/04/2023 0928   MCH 29.3 12/04/2023 0928   MCHC 33.5 12/04/2023 0928   RDW 14.5 12/04/2023 0928   LYMPHSABS 1.8 12/04/2023 0928   MONOABS 0.5 12/04/2023 0928   EOSABS 0.2 12/04/2023 0928   BASOSABS 0.0 12/04/2023 0928    CMP     Component Value Date/Time   NA 138 12/04/2023 0928   K 3.9 12/04/2023 0928   CL 105 12/04/2023 0928   CO2 27 12/04/2023 0928   GLUCOSE 99 12/04/2023 0928   BUN 14 12/04/2023 0928   CREATININE 0.69 12/04/2023 0928   CALCIUM 9.0 12/04/2023 0928   PROT 6.9 12/04/2023 0928   ALBUMIN 4.1 12/04/2023 0928   AST 10 (L) 12/04/2023 0928   ALT 12 12/04/2023 0928   ALKPHOS 95 12/04/2023 0928   BILITOT 0.5 12/04/2023 0928   GFRNONAA >60 12/04/2023 0928   GFRAA >60 10/27/2019 1528       ASSESSMENT and THERAPY PLAN:   Breast cancer metastasized to liver (HCC) 02/11/19: RT lumpectomy: DICS Intermediate grade, Margins Neg, ER 90%, PR 70%; Lt Lumpectomy: UDH status post radiation, could not tolerate tamoxifen recurrence: 08/15/2021:MRI surveillance detected Right breast calcifications 5 cm, Ant biopsy: IG DCIS with necrosis, ER 40%, PR 0%, Posterior biopsy: Intermediate grade DCIS with [facility]  11/29/2021: Right  mastectomy: High-grade DCIS with microinvasion, ER 0%, PR 0%, HER2 positive, 0/1 lymph node negative  T1c MIC, N0: Stage Ia   Patient had second opinion at [facility] with Dr. Creig Hines who agreed with her plan of no systemic chemotherapy   Breast cancer surveillance: 1.  Mammogram 08/22/2022: Left breast benign breast density category B  2.  CT CAP 12/09/2022: Multiple liver lesions indicative of cysts and hemangiomas new right hepatic lobe lesion favored to be hemangioma otherwise no evidence of metastatic disease.  Radiology recommended an MRI of the liver. 3.  Liver MRI 01/08/2023: 2 New rim-enhancing lesions 2.3 cm (was 1.6 cm on 12/09/2022), 1.8 cm (measured 1.5 cm), benign hemangioma 2.4 cm and 1.9 cm and 1.3 cm unchanged 4.  PET CT scan 01/22/2023: Liver: 2.5 cm and 2.4 cm lesions ------------------------------------------------------------------------------------------------------------------------------------------------- 01/31/2023: Liver lesion biopsy: Metastatic adenocarcinoma to the liver consistent with breast primary positive for CK7 and GATA3, ER 0%, PR 0%, Ki-67 25%, HER2 3+ positive, [facility] molecular testing is pending   Current treatment: Completed 6 cycles of Taxotere Herceptin Perjeta on 05/30/2023, currently on maintenance Herceptin and Perjeta with PHESGO Herceptin Perjeta toxicities: Tolerating it extremely well. She saw Dr. Roland Earl at [facility] for second opinion who agreed with the treatment plan. Meningioma: Follows with Dr. Barbaraann Cao, brain MRI  08/04/2023: Stable PET/CT: 06/19/2023: Interval resolution of the hypermetabolic lesions in the liver. PET/CT 10/03/2023: No evidence of recurrent/metastatic cancer low-density liver lesions less conspicuous without any hypermetabolic activity Recommendation:Phesgo.  Every 3 weeks  Stage 4 Cancer Patient is currently asymptomatic with negative GARDENT 360 test. Patient expresses confusion and concern about the lack of active disease despite  being  stage 4. Discussed the possibility of subtle, undetectable disease presence and the uncertainty of disease progression. -Continue current treatment regimen. -CT scans 01/08/2024 -Plan for follow-up with Dr. Georgiann Mohs 01/15/2024 after next scan.  Diarrhea Patient reports frequent bowel movements, especially in the first week after treatment, and loose stools. Discussed that this is a common side effect of the treatment. -Take Imodium as needed, especially before diarrhea becomes severe.  Fatigue Patient reports significant fatigue since starting new heart medication (Entresto and Metoprolol) on December 27. Cardiologist suggested suspending one of the medications for a few days to see if energy improves. Patient reports feeling better since stopping Metoprolol. -Stay off Metoprolol until further guidance from cardiologist (Dr. Gala Romney is aware). -Plan for home sleep study to evaluate for sleep apnea as a potential cause of fatigue.  Hypertension Managed with Entresto. Patient reports normal blood pressure readings. -Continue Entresto as prescribed.  Port Maintenance Patient's port does not require flushing at this visit. -Plan for port flush at next visit on February 20.  All questions were answered. The patient knows to call the clinic with any problems, questions or concerns. We can certainly see the patient much sooner if necessary.  Total encounter time:30 minutes*in face-to-face visit time, chart review, lab review, care coordination, order entry, and documentation of the encounter time.    Lillard Anes, NP 12/25/23 12:46 PM Medical Oncology and Hematology [facility] [address] [place], [place] [address] Tel. [phone]    Fax. [phone]  *Total Encounter Time as defined by the Centers for Medicare and Medicaid Services includes, in addition to the face-to-face time of a patient visit (documented in the note above) non-face-to-face time: obtaining  and reviewing outside history, ordering and reviewing medications, tests or procedures, care coordination (communications with other health care professionals or caregivers) and documentation in the medical record.

## 2023-12-25 NOTE — Telephone Encounter (Signed)
Per MD request RN placed call to pt with recent Guardant Reveal results being negative.  Pt educated and verbalized understanding.

## 2023-12-26 ENCOUNTER — Encounter: Payer: Self-pay | Admitting: Hematology and Oncology

## 2024-01-08 ENCOUNTER — Ambulatory Visit (HOSPITAL_COMMUNITY)
Admission: RE | Admit: 2024-01-08 | Discharge: 2024-01-08 | Disposition: A | Discharge status: Home / Self Care | Payer: BC Managed Care – PPO | Source: Ambulatory Visit | Attending: Hematology and Oncology | Admitting: Hematology and Oncology

## 2024-01-08 DIAGNOSIS — J069 Acute upper respiratory infection, unspecified: Secondary | ICD-10-CM | POA: Diagnosis not present

## 2024-01-08 DIAGNOSIS — J019 Acute sinusitis, unspecified: Secondary | ICD-10-CM | POA: Diagnosis not present

## 2024-01-08 DIAGNOSIS — I700 Atherosclerosis of aorta: Secondary | ICD-10-CM | POA: Diagnosis not present

## 2024-01-08 DIAGNOSIS — C50919 Malignant neoplasm of unspecified site of unspecified female breast: Secondary | ICD-10-CM | POA: Diagnosis not present

## 2024-01-08 DIAGNOSIS — C787 Secondary malignant neoplasm of liver and intrahepatic bile duct: Secondary | ICD-10-CM | POA: Diagnosis not present

## 2024-01-08 DIAGNOSIS — K769 Liver disease, unspecified: Secondary | ICD-10-CM | POA: Diagnosis not present

## 2024-01-08 DIAGNOSIS — N1330 Unspecified hydronephrosis: Secondary | ICD-10-CM | POA: Diagnosis not present

## 2024-01-08 MED ORDER — IOHEXOL 300 MG/ML  SOLN
100.0000 mL | Freq: Once | INTRAMUSCULAR | Status: AC | PRN
  Administered 2024-01-08: 100 mL via INTRAVENOUS

## 2024-01-09 ENCOUNTER — Other Ambulatory Visit (HOSPITAL_COMMUNITY): Payer: BC Managed Care – PPO

## 2024-01-15 ENCOUNTER — Inpatient Hospital Stay: Payer: BC Managed Care – PPO | Attending: Hematology and Oncology | Admitting: Hematology and Oncology

## 2024-01-15 ENCOUNTER — Inpatient Hospital Stay: Payer: BC Managed Care – PPO

## 2024-01-15 VITALS — BP 137/78 | HR 76 | Temp 98.1°F | Resp 18 | Ht 65.0 in | Wt 176.6 lb

## 2024-01-15 VITALS — BP 141/91 | HR 71 | Resp 18

## 2024-01-15 DIAGNOSIS — Z5112 Encounter for antineoplastic immunotherapy: Secondary | ICD-10-CM | POA: Insufficient documentation

## 2024-01-15 DIAGNOSIS — C50919 Malignant neoplasm of unspecified site of unspecified female breast: Secondary | ICD-10-CM

## 2024-01-15 DIAGNOSIS — Z79899 Other long term (current) drug therapy: Secondary | ICD-10-CM | POA: Diagnosis not present

## 2024-01-15 DIAGNOSIS — Z170 Estrogen receptor positive status [ER+]: Secondary | ICD-10-CM | POA: Insufficient documentation

## 2024-01-15 DIAGNOSIS — Z95828 Presence of other vascular implants and grafts: Secondary | ICD-10-CM

## 2024-01-15 DIAGNOSIS — Z923 Personal history of irradiation: Secondary | ICD-10-CM | POA: Insufficient documentation

## 2024-01-15 DIAGNOSIS — D0511 Intraductal carcinoma in situ of right breast: Secondary | ICD-10-CM | POA: Insufficient documentation

## 2024-01-15 DIAGNOSIS — C787 Secondary malignant neoplasm of liver and intrahepatic bile duct: Secondary | ICD-10-CM | POA: Insufficient documentation

## 2024-01-15 DIAGNOSIS — K769 Liver disease, unspecified: Secondary | ICD-10-CM

## 2024-01-15 LAB — CBC WITH DIFFERENTIAL (CANCER CENTER ONLY)
Abs Immature Granulocytes: 0.01 10*3/uL (ref 0.00–0.07)
Basophils Absolute: 0.1 10*3/uL (ref 0.0–0.1)
Basophils Relative: 1 %
Eosinophils Absolute: 0.3 10*3/uL (ref 0.0–0.5)
Eosinophils Relative: 4 %
HCT: 38.3 % (ref 36.0–46.0)
Hemoglobin: 12.4 g/dL (ref 12.0–15.0)
Immature Granulocytes: 0 %
Lymphocytes Relative: 28 %
Lymphs Abs: 1.8 10*3/uL (ref 0.7–4.0)
MCH: 29.4 pg (ref 26.0–34.0)
MCHC: 32.4 g/dL (ref 30.0–36.0)
MCV: 90.8 fL (ref 80.0–100.0)
Monocytes Absolute: 0.4 10*3/uL (ref 0.1–1.0)
Monocytes Relative: 6 %
Neutro Abs: 4 10*3/uL (ref 1.7–7.7)
Neutrophils Relative %: 61 %
Platelet Count: 258 10*3/uL (ref 150–400)
RBC: 4.22 MIL/uL (ref 3.87–5.11)
RDW: 13.2 % (ref 11.5–15.5)
WBC Count: 6.5 10*3/uL (ref 4.0–10.5)
nRBC: 0 % (ref 0.0–0.2)

## 2024-01-15 LAB — CMP (CANCER CENTER ONLY)
ALT: 13 U/L (ref 0–44)
AST: 11 U/L — ABNORMAL LOW (ref 15–41)
Albumin: 4 g/dL (ref 3.5–5.0)
Alkaline Phosphatase: 90 U/L (ref 38–126)
Anion gap: 6 (ref 5–15)
BUN: 18 mg/dL (ref 6–20)
CO2: 26 mmol/L (ref 22–32)
Calcium: 8.5 mg/dL — ABNORMAL LOW (ref 8.9–10.3)
Chloride: 107 mmol/L (ref 98–111)
Creatinine: 0.72 mg/dL (ref 0.44–1.00)
GFR, Estimated: >60 mL/min (ref >60)
Glucose, Bld: 102 mg/dL — ABNORMAL HIGH (ref 70–99)
Potassium: 3.8 mmol/L (ref 3.5–5.1)
Sodium: 139 mmol/L (ref 135–145)
Total Bilirubin: 0.2 mg/dL (ref 0.0–1.2)
Total Protein: 6.4 g/dL — ABNORMAL LOW (ref 6.5–8.1)

## 2024-01-15 LAB — PREGNANCY, URINE: Preg Test, Ur: NEGATIVE

## 2024-01-15 MED ORDER — SODIUM CHLORIDE 0.9% FLUSH
10.0000 mL | Freq: Once | INTRAVENOUS | Status: AC
Start: 2024-01-15 — End: 2024-01-15
  Administered 2024-01-15: 10 mL

## 2024-01-15 MED ORDER — DIPHENHYDRAMINE HCL 25 MG PO CAPS
50.0000 mg | ORAL_CAPSULE | Freq: Once | ORAL | Status: AC
  Administered 2024-01-15: 50 mg via ORAL
  Filled 2024-01-15: qty 2

## 2024-01-15 MED ORDER — HEPARIN SOD (PORK) LOCK FLUSH 100 UNIT/ML IV SOLN
500.0000 [IU] | Freq: Once | INTRAVENOUS | Status: AC
  Administered 2024-01-15: 500 [IU]

## 2024-01-15 MED ORDER — PERTUZ-TRASTUZ-HYALURON-ZZXF 60-60-2000 MG-MG-U/ML CHEMO ~~LOC~~ SOLN
10.0000 mL | Freq: Once | SUBCUTANEOUS | Status: AC
  Administered 2024-01-15: 10 mL via SUBCUTANEOUS
  Filled 2024-01-15: qty 10

## 2024-01-15 MED ORDER — ACETAMINOPHEN 325 MG PO TABS
650.0000 mg | ORAL_TABLET | Freq: Once | ORAL | Status: AC
Start: 2024-01-15 — End: 2024-01-15
  Administered 2024-01-15: 650 mg via ORAL
  Filled 2024-01-15: qty 2

## 2024-01-15 NOTE — Assessment & Plan Note (Signed)
02/11/19: RT lumpectomy: DICS Intermediate grade, Margins Neg, ER 90%, PR 70%; Lt Lumpectomy: UDH status post radiation, could not tolerate tamoxifen recurrence: 08/15/2021:MRI surveillance detected Right breast calcifications 5 cm, Ant biopsy: IG DCIS with necrosis, ER 40%, PR 0%, Posterior biopsy: Intermediate grade DCIS with [facility]  11/29/2021: Right mastectomy: High-grade DCIS with microinvasion, ER 0%, PR 0%, HER2 positive, 0/1 lymph node negative  T1c MIC, N0: Stage Ia   Patient had second opinion at [facility] with Dr. Creig Hines who agreed with her plan of no systemic chemotherapy   Breast cancer surveillance: 1.  Mammogram 08/22/2022: Left breast benign breast density category B  2.  CT CAP 12/09/2022: Multiple liver lesions indicative of cysts and hemangiomas new right hepatic lobe lesion favored to be hemangioma otherwise no evidence of metastatic disease.  Radiology recommended an MRI of the liver. 3.  Liver MRI 01/08/2023: 2 New rim-enhancing lesions 2.3 cm (was 1.6 cm on 12/09/2022), 1.8 cm (measured 1.5 cm), benign hemangioma 2.4 cm and 1.9 cm and 1.3 cm unchanged 4.  PET CT scan 01/22/2023: Liver: 2.5 cm and 2.4 cm lesions ------------------------------------------------------------------------------------------------------------------------------------------------- 01/31/2023: Liver lesion biopsy: Metastatic adenocarcinoma to the liver consistent with breast primary positive for CK7 and GATA3, ER 0%, PR 0%, Ki-67 25%, HER2 3+ positive, [facility]is molecular testing is pending   Current treatment: Completed 6 cycles of Taxotere Herceptin Perjeta on 05/30/2023, currently on maintenance Herceptin and Perjeta Herceptin Perjeta toxicities:  Cardiomyopathy: Fatigue and palpitations on Entresto and metoprolol.  Follows cardiology Skin erythema from the injections  She saw Dr. Roland Earl at [facility] for second opinion who agreed with the treatment plan.   Meningioma: Follows with Dr. Barbaraann Cao, brain MRI  08/04/2023:  Stable, new brain MRI has been ordered  PET/CT: 06/19/2023: Interval resolution of the hypermetabolic lesions in the liver. PET/CT 10/03/2023: No evidence of recurrent/metastatic cancer low-density liver lesions less conspicuous without any hypermetabolic activity 01/08/2024: CT CAP: Stable liver lesions 2.2 cm, 2 cm.  No new lesions.  Stable bilateral pulmonary nodules (considered benign)  Continue every 3-week Phesgo injections

## 2024-01-15 NOTE — Progress Notes (Signed)
Patient Care Team: [name], MD as PCP - General (Family Medicine) Donnelly Angelica, RN as Oncology Nurse Navigator Pershing Proud, RN as Oncology Nurse Navigator Serena Croissant, MD as Consulting Physician (Hematology and Oncology) Default, Provider, MD as Technician  DIAGNOSIS:  Encounter Diagnosis  Name Primary?   Carcinoma of breast metastatic to liver, unspecified laterality (HCC) Yes    SUMMARY OF ONCOLOGIC HISTORY: Oncology History  Breast cancer metastasized to liver (HCC)  01/25/2019 Initial Diagnosis   Screening mammogram showed bilateral calcifications right breast 5 mm, pleomorphic.  Left breast 1.6 cm punctate, smaller group of calcifications spanning 3 mm.  Right lower outer quadrant biopsy revealed low-grade DCIS ER 90%, PR 70%; biopsy of left breast calcifications ALH and PASH   01/31/2019 Genetic Testing   ATM c.8495G>A and RECQL4 c.2587G>A VUS identified on the 9-gene STAT panel and Multi-cancer panel through [facility].  The STAT Breast cancer panel offered by [facility] includes sequencing and rearrangement analysis for the following 9 genes:  ATM, BRCA1, BRCA2, CDH1, CHEK2, PALB2, PTEN, STK11 and TP53.   The Multi-Gene Panel offered by [facility] includes sequencing and/or deletion duplication testing of the following 84 genes: AIP, ALK, APC, ATM, AXIN2,BAP1,  BARD1, BLM, BMPR1A, BRCA1, BRCA2, BRIP1, CASR, CDC73, CDH1, CDK4, CDKN1B, CDKN1C, CDKN2A (p14ARF), CDKN2A (p16INK4a), CEBPA, CHEK2, CTNNA1, DICER1, DIS3L2, EGFR (c.2369C>T, p.Thr790Met variant only), EPCAM (Deletion/duplication testing only), FH, FLCN, GATA2, GPC3, GREM1 (Promoter region deletion/duplication testing only), HOXB13 (c.251G>A, p.Gly84Glu), HRAS, KIT, MAX, MEN1, MET, MITF (c.952G>A, p.Glu318Lys variant only), MLH1, MSH2, MSH3, MSH6, MUTYH, NBN, NF1, NF2, NTHL1, PALB2, PDGFRA, PHOX2B, PMS2, POLD1, POLE, POT1, PRKAR1A, PTCH1, PTEN, RAD50, RAD51C, RAD51D, RB1, RECQL4, RET, RUNX1, SDHAF2, SDHA (sequence changes  only), SDHB, SDHC, SDHD, SMAD4, SMARCA4, SMARCB1, SMARCE1, STK11, SUFU, TERC, TERT, TMEM127, TP53, TSC1, TSC2, VHL, WRN and WT1.  he report date is February 02, 2019.   02/11/2019 Surgery   RT lumpectomy: DICS Intermediate grade, Margins Neg, ER 90%, PR 70%; Lt Lumpectomy: UDH   02/26/2019 -  Anti-estrogen oral therapy   Tamoxifen 20mg  daily, stopped because of depression and facial numbness   05/20/2019 - 06/17/2019 Radiation Therapy   Adjuvant XRT   08/15/2021 Relapse/Recurrence   MRI surveillance detected Right breast calcifications 5 cm, Ant biopsy: IG DCIS with necrosis, ER 40%, PR 0%, Posterior biopsy: Intermediate grade DCIS with [facility]   08/19/2022 Cancer Staging   Staging form: Breast, AJCC 8th Edition - Clinical: Stage IV (cT52mi, cNX, cM1, G3, ER: Unknown, PR: Unknown, HER2: Unknown) - Signed by Serena Croissant, MD on 02/06/2023 Histologic grading system: 3 grade system   02/14/2023 -  Chemotherapy   Patient is on Treatment Plan : BREAST DOCEtaxel + Trastuzumab + Pertuzumab (THP) q21d x 8 cycles / Trastuzumab + Pertuzumab q21d x 4 cycles     AML (acute myeloid leukemia) (HCC)  09/1998 Initial Diagnosis   AML (acute myeloid leukemia) (HCC) treated with 2 induction regimens followed by 6 consolidation regimens, remission     CHIEF COMPLIANT: Phesgo injection  HISTORY OF PRESENT ILLNESS:   History of Present Illness   Joann Meadows is a 48 year old female who presents for follow-up regarding her liver lesions and treatment options.  She recently underwent a CT scan, which showed no new spots and no increase in size of existing liver lesions. She is concerned about the permanence of the lesions and their potential to become active again.  A recent Guardian test came back negative, indicating no detectable cancer cells. She is curious  about its implications, as the test is highly sensitive and expected to detect even minute traces of cancer.  She is currently receiving Ofesco injections,  which cause significant skin redness and irritation lasting more than three weeks. She is considering switching back to intravenous administration of Herceptin and Perjeta, as she still has a port and finds the injections cumbersome. She initially chose the injections to reduce time spent at the clinic, but now feels the difference is negligible.  She is planning a trip to [place] in the summer and is considering extending her treatment intervals to accommodate her travel plans. She intends to leave her children with her father and family while she travels back and forth for treatment.         ALLERGIES:  is allergic to levaquin [levofloxacin], tamoxifen citrate, and tape.  MEDICATIONS:  Current Outpatient Medications  Medication Sig Dispense Refill   cholecalciferol (VITAMIN D3) 25 MCG (1000 UNIT) tablet Take 2,000 Units by mouth daily.     fluticasone (FLONASE) 50 MCG/ACT nasal spray Place 1 spray into both nostrils daily.     loratadine (CLARITIN) 10 MG tablet 1 tablet Orally Once a day     sacubitril-valsartan (ENTRESTO) 24-26 MG Take 1 tablet by mouth 2 (two) times daily. 60 tablet 11   Zinc-Vitamin C (ZINC-A-COLD/VITAMIN C MT) Use as directed 1 capsule in the mouth or throat daily.     acetaminophen (TYLENOL) 325 MG tablet Take 650 mg by mouth every 6 (six) hours as needed. (Patient not taking: Reported on 01/15/2024)     ALPRAZolam (XANAX) 0.25 MG tablet 1 tablet Orally once a day if needed for anxiety/panic (not a daily medication, avoid regular use) for 30 days (Patient not taking: Reported on 01/15/2024)     Ibuprofen (ADVIL) 200 MG CAPS Take 1 capsule by mouth as needed. (Patient not taking: Reported on 01/15/2024)     metoprolol succinate (TOPROL XL) 25 MG 24 hr tablet Take 1 tablet (25 mg total) by mouth at bedtime. (Patient not taking: Reported on 01/15/2024) 30 tablet 3   No current facility-administered medications for this visit.   Facility-Administered Medications Ordered in Other  Visits  Medication Dose Route Frequency Provider Last Rate Last Admin   pertuz-trastuz-hyaluron-zzxf (PHESGO) 600-600-20000 MG-MG-U/10ML chemo SQ injection maintenance dose 10 mL  10 mL Subcutaneous Once Serena Croissant, MD        PHYSICAL EXAMINATION: ECOG PERFORMANCE STATUS: 1 - Symptomatic but completely ambulatory  Vitals:   01/15/24 1005  BP: 137/78  Pulse: 76  Resp: 18  Temp: 98.1 F (36.7 C)  SpO2: 98%   Filed Weights   01/15/24 1005  Weight: 176 lb 9.6 oz (80.1 kg)      LABORATORY DATA:  I have reviewed the data as listed    Latest Ref Rng & Units 01/15/2024    9:26 AM 12/04/2023    9:28 AM 11/14/2023    9:10 AM  CMP  Glucose 70 - 99 mg/dL 784  99  696   BUN 6 - 20 mg/dL 18  14  21    Creatinine 0.44 - 1.00 mg/dL 2.95  2.84  1.32   Sodium 135 - 145 mmol/L 139  138  137   Potassium 3.5 - 5.1 mmol/L 3.8  3.9  4.1   Chloride 98 - 111 mmol/L 107  105  106   CO2 22 - 32 mmol/L 26  27  24    Calcium 8.9 - 10.3 mg/dL 8.5  9.0  9.0  Total Protein 6.5 - 8.1 g/dL 6.4  6.9  6.8   Total Bilirubin 0.0 - 1.2 mg/dL 0.2  0.5  0.4   Alkaline Phos 38 - 126 U/L 90  95  106   AST 15 - 41 U/L 11  10  17    ALT 0 - 44 U/L 13  12  26      Lab Results  Component Value Date   WBC 6.5 01/15/2024   HGB 12.4 01/15/2024   HCT 38.3 01/15/2024   MCV 90.8 01/15/2024   PLT 258 01/15/2024   NEUTROABS 4.0 01/15/2024    ASSESSMENT & PLAN:  Breast cancer metastasized to liver (HCC) 02/11/19: RT lumpectomy: DICS Intermediate grade, Margins Neg, ER 90%, PR 70%; Lt Lumpectomy: UDH status post radiation, could not tolerate tamoxifen recurrence: 08/15/2021:MRI surveillance detected Right breast calcifications 5 cm, Ant biopsy: IG DCIS with necrosis, ER 40%, PR 0%, Posterior biopsy: Intermediate grade DCIS with [facility]  11/29/2021: Right mastectomy: High-grade DCIS with microinvasion, ER 0%, PR 0%, HER2 positive, 0/1 lymph node negative  T1c MIC, N0: Stage Ia   Patient had second opinion at [facility] with Dr.  Creig Hines who agreed with her plan of no systemic chemotherapy   Breast cancer surveillance: 1.  Mammogram 08/22/2022: Left breast benign breast density category B  2.  CT CAP 12/09/2022: Multiple liver lesions indicative of cysts and hemangiomas new right hepatic lobe lesion favored to be hemangioma otherwise no evidence of metastatic disease.  Radiology recommended an MRI of the liver. 3.  Liver MRI 01/08/2023: 2 New rim-enhancing lesions 2.3 cm (was 1.6 cm on 12/09/2022), 1.8 cm (measured 1.5 cm), benign hemangioma 2.4 cm and 1.9 cm and 1.3 cm unchanged 4.  PET CT scan 01/22/2023: Liver: 2.5 cm and 2.4 cm lesions ------------------------------------------------------------------------------------------------------------------------------------------------- 01/31/2023: Liver lesion biopsy: Metastatic adenocarcinoma to the liver consistent with breast primary positive for CK7 and GATA3, ER 0%, PR 0%, Ki-67 25%, HER2 3+ positive, [facility] molecular testing is pending   Current treatment: Completed 6 cycles of Taxotere Herceptin Perjeta on 05/30/2023, currently on maintenance Herceptin and Perjeta Herceptin Perjeta toxicities:  Cardiomyopathy: Fatigue and palpitations on Entresto and metoprolol.  Follows cardiology Skin erythema from the injections  She saw Dr. Roland Earl at [facility] for second opinion who agreed with the treatment plan.   Meningioma: Follows with Dr. Barbaraann Cao, brain MRI  08/04/2023: Stable, new brain MRI has been ordered  PET/CT: 06/19/2023: Interval resolution of the hypermetabolic lesions in the liver. PET/CT 10/03/2023: No evidence of recurrent/metastatic cancer low-density liver lesions less conspicuous without any hypermetabolic activity 01/08/2024: CT CAP: Stable liver lesions 2.2 cm, 2 cm.  No new lesions.  Stable bilateral pulmonary nodules (considered benign)  Because of having a port and the skin reaction with each vascular injection, I recommend that we switch her to infusion of  Herceptin and Perjeta. Guardant reveal: Negative ------------------------------------- Assessment and Plan    Metastatic Liver Lesions Metastatic liver lesions are currently well-managed with no new spots or size increase per recent CT scan. Lesions may represent scar tissue; PET scan needed to confirm activity. Surgical removal is not feasible due to extensive liver involvement. Potential treatments include Y90 radioembolization and liver ablation, to be discussed in radiology consultation. - Order PET scan - Refer to radiology for consultation on Y90 treatment and liver ablation  Breast Cancer Currently on subcutaneous Herceptin and Perjeta, experiencing significant skin irritation and prolonged redness at injection sites. Considering switching to IV administration to avoid skin issues. She has a  port and prefers IV administration. - Switch to IV Herceptin and Perjeta for next treatment    General Health Maintenance Planning to travel to [place] for 6-7 weeks in the summer. Discussed extending treatment interval to every four weeks to accommodate travel. Will return for treatment once during the stay if necessary. - Extend treatment interval to every four weeks during summer travel - Schedule all necessary scans and tests before travel  Follow-up - Continue GARDEN test every six months       No orders of the defined types were placed in this encounter.  The patient has a good understanding of the overall plan. she agrees with it. she will call with any problems that may develop before the next visit here. Total time spent: 30 mins including face to face time and time spent for planning, charting and co-ordination of care   Tamsen Meek, MD 01/15/24

## 2024-01-15 NOTE — Patient Instructions (Signed)
 [facility] - A DEPT OF [facility]  Discharge Instructions: Thank you for choosing [facility] to provide your oncology and hematology care.   If you have a lab appointment with the Cancer Center, please go directly to the Cancer Center and check in at the registration area.   Wear comfortable clothing and clothing appropriate for easy access to any Portacath or PICC line.   We strive to give you quality time with your provider. You may need to reschedule your appointment if you arrive late (15 or more minutes).  Arriving late affects you and other patients whose appointments are after yours.  Also, if you miss three or more appointments without notifying the office, you may be dismissed from the clinic at the provider's discretion.      For prescription refill requests, have your pharmacy contact our office and allow 72 hours for refills to be completed.    Today you received the following chemotherapy and/or immunotherapy agents: Phesgo.      To help prevent nausea and vomiting after your treatment, we encourage you to take your nausea medication as directed.  BELOW ARE SYMPTOMS THAT SHOULD BE REPORTED IMMEDIATELY: *FEVER GREATER THAN 100.4 F (38 C) OR HIGHER *CHILLS OR SWEATING *NAUSEA AND VOMITING THAT IS NOT CONTROLLED WITH YOUR NAUSEA MEDICATION *UNUSUAL SHORTNESS OF BREATH *UNUSUAL BRUISING OR BLEEDING *URINARY PROBLEMS (pain or burning when urinating, or frequent urination) *BOWEL PROBLEMS (unusual diarrhea, constipation, pain near the anus) TENDERNESS IN MOUTH AND THROAT WITH OR WITHOUT PRESENCE OF ULCERS (sore throat, sores in mouth, or a toothache) UNUSUAL RASH, SWELLING OR PAIN  UNUSUAL VAGINAL DISCHARGE OR ITCHING   Items with * indicate a potential emergency and should be followed up as soon as possible or go to the Emergency Department if any problems should occur.  Please show the CHEMOTHERAPY ALERT CARD or IMMUNOTHERAPY  ALERT CARD at check-in to the Emergency Department and triage nurse.  Should you have questions after your visit or need to cancel or reschedule your appointment, please contact [facility] - A DEPT OF [facility]  Dept: [phone]  and follow the prompts.  Office hours are 8:00 a.m. to 4:30 p.m. Monday - Friday. Please note that voicemails left after 4:00 p.m. may not be returned until the following business day.  We are closed weekends and major holidays. You have access to a nurse at all times for urgent questions. Please call the main number to the clinic Dept: [phone] and follow the prompts.   For any non-urgent questions, you may also contact your provider using MyChart. We now offer e-Visits for anyone 2 and older to request care online for non-urgent symptoms. For details visit [email or web address].   Also download the MyChart app! Go to the app store, search "MyChart", open the app, select [place], and log in with your MyChart username and password.

## 2024-01-16 ENCOUNTER — Other Ambulatory Visit: Payer: Self-pay | Admitting: Hematology and Oncology

## 2024-01-16 DIAGNOSIS — C50919 Malignant neoplasm of unspecified site of unspecified female breast: Secondary | ICD-10-CM

## 2024-01-22 ENCOUNTER — Ambulatory Visit
Admission: RE | Admit: 2024-01-22 | Discharge: 2024-01-22 | Disposition: A | Discharge status: Home / Self Care | Payer: BC Managed Care – PPO | Source: Ambulatory Visit | Attending: Hematology and Oncology | Admitting: Hematology and Oncology

## 2024-01-22 ENCOUNTER — Other Ambulatory Visit: Payer: Self-pay | Admitting: Interventional Radiology

## 2024-01-22 DIAGNOSIS — C787 Secondary malignant neoplasm of liver and intrahepatic bile duct: Secondary | ICD-10-CM | POA: Diagnosis not present

## 2024-01-22 DIAGNOSIS — C50919 Malignant neoplasm of unspecified site of unspecified female breast: Secondary | ICD-10-CM

## 2024-01-22 DIAGNOSIS — C50911 Malignant neoplasm of unspecified site of right female breast: Secondary | ICD-10-CM | POA: Diagnosis not present

## 2024-01-22 HISTORY — PX: IR RADIOLOGIST EVAL & MGMT: IMG5224

## 2024-01-22 NOTE — Consult Note (Signed)
 Chief Complaint: Patient was seen in consultation today for breast cancer metastatic to the liver at the request of Gudena,Vinay  Referring Physician(s): Gudena,Vinay  History of Present Illness: Joann Meadows is a 48 y.o. female with an unfortunate diagnosis of stage IV metastatic breast cancer.  Her diagnosis began in March 2020 when she had a 1.6 cm right breast nodule excised with a pathologic diagnosis of DCIS.  Margins were clear.  She underwent standard radiation therapy and oral antiestrogen therapy.  Unfortunately, in 2022 she had evidence of recurrence on surveillance breast MRI and underwent right mastectomy and reconstruction.  Subsequent imaging surveillance is identified multifocal liver lesions consistent with metastatic disease.  No evidence of metastatic disease outside of the liver.  She is currently receiving oh Ofesco injections but is considering switching back to intravenous Herceptin and Perjeta.  Her most recent PET/CT imaging demonstrated no evidence of residual hypermetabolic disease.  However, the liver lesions remain unchanged in size.  We have concerned that she may be susceptible to recurrent disease in the liver.  She has young children and is highly motivated to pursue all means of therapy in order to be there to see her children grow up.  She presents today to discuss options for liver directed therapy.  Overall she is doing well although she does have issues with skin redness and irritation following her injections as well as chronic fatigue and mild malaise.  Past Medical History:  Diagnosis Date   Breast cancer (HCC) 2020   Right Breast Cancer   Endometriosis    Fibroid    Hx of radiation therapy 06/2019   Leukemia (HCC)    20 years ago   Personal history of radiation therapy 2020   Right Breast Cancer   PONV (postoperative nausea and vomiting)    pt states she vomits after colonoscopies    Past Surgical History:  Procedure Laterality Date    BREAST EXCISIONAL BIOPSY Left 02/11/2019   [facility]   BREAST LUMPECTOMY Right 02/11/2019   BREAST LUMPECTOMY WITH RADIOACTIVE SEED LOCALIZATION Bilateral 02/11/2019   Procedure: BILATERAL BREAST LUMPECTOMIES WITH BILATERAL RADIOACTIVE SEED LOCALIZATION;  Surgeon: Manus Rudd, MD;  Location: [facility];  Service: General;  Laterality: Bilateral;   IR IMAGING GUIDED PORT INSERTION  02/25/2023   IR RADIOLOGIST EVAL & MGMT  02/27/2023   IR RADIOLOGIST EVAL & MGMT  01/22/2024   MASTECTOMY Right 11/2021    Allergies: Levaquin [levofloxacin], Tamoxifen citrate, and Tape  Medications: Prior to Admission medications   Medication Sig Start Date End Date Taking? Authorizing Provider  acetaminophen (TYLENOL) 325 MG tablet Take 650 mg by mouth every 6 (six) hours as needed.   Yes [provider]  cholecalciferol (VITAMIN D3) 25 MCG (1000 UNIT) tablet Take 2,000 Units by mouth daily.   Yes [provider]  fluticasone (FLONASE) 50 MCG/ACT nasal spray Place 1 spray into both nostrils daily.   Yes [provider]  Ibuprofen (ADVIL) 200 MG CAPS Take 1 capsule by mouth as needed.   Yes [provider]  loratadine (CLARITIN) 10 MG tablet 1 tablet Orally Once a day   Yes [provider]  sacubitril-valsartan (ENTRESTO) 24-26 MG Take 1 tablet by mouth 2 (two) times daily. 12/02/23  Yes Milford, Anderson Malta, FNP  Zinc-Vitamin C (ZINC-A-COLD/VITAMIN C MT) Use as directed 1 capsule in the mouth or throat daily.   Yes [provider]  ALPRAZolam (XANAX) 0.25 MG tablet 1 tablet Orally once a day if  needed for anxiety/panic (not a daily medication, avoid regular use) for 30 days Patient not taking: Reported on 01/22/2024 01/14/23   [provider]  metoprolol succinate (TOPROL XL) 25 MG 24 hr tablet Take 1 tablet (25 mg total) by mouth at bedtime. 12/02/23   Jacklynn Ganong, FNP     Family History  Problem Relation Age of Onset   Colon cancer  Mother 20       d. 103   Hypertension Father    Diabetes Maternal Aunt    Diabetes Maternal Uncle    Mental retardation Maternal Uncle    Diabetes Paternal Aunt    Diabetes Maternal Grandmother    Stroke Paternal Grandmother        c. 44   Leukemia Paternal Grandfather        d. 36; ?CLL   Leukemia Cousin 4       mat first cousin   Lymphoma Cousin 40       pat first cousin    Social History   Socioeconomic History   Marital status: Single    Spouse name: Not on file   Number of children: 2   Years of education: Not on file   Highest education level: High school graduate  Occupational History   Not on file  Tobacco Use   Smoking status: Former    Current packs/day: 0.00    Types: Cigarettes    Start date: 01/24/2005    Quit date: 01/25/2015    Years since quitting: 8.9   Smokeless tobacco: Never  Vaping Use   Vaping status: Never Used  Substance and Sexual Activity   Alcohol use: No   Drug use: No   Sexual activity: Yes    Birth control/protection: None  Other Topics Concern   Not on file  Social History Narrative   Lives with children   Social Drivers of Health   Financial Resource Strain: Not on file  Food Insecurity: No Food Insecurity (10/03/2022)   Hunger Vital Sign    Worried About Running Out of Food in the Last Year: Never true    Ran Out of Food in the Last Year: Never true  Transportation Needs: No Transportation Needs (10/03/2022)   PRAPARE - Administrator, Civil Service (Medical): No    Lack of Transportation (Non-Medical): No  Physical Activity: Not on file  Stress: Not on file  Social Connections: Not on file    ECOG Status: 1 - Symptomatic but completely ambulatory  Review of Systems: A 12 point ROS discussed and pertinent positives are indicated in the HPI above.  All other systems are negative.  Review of Systems  Vital Signs: BP (!) 159/97 (BP Location: Left Arm, Patient Position: Sitting)   Pulse 73   Temp 98.4 F (36.9  C) (Oral)   Resp 16   SpO2 99%     Physical Exam Constitutional:      General: She is not in acute distress.    Appearance: Normal appearance.  HENT:     Head: Normocephalic and atraumatic.  Eyes:     General: No scleral icterus. Cardiovascular:     Rate and Rhythm: Normal rate.  Pulmonary:     Effort: Pulmonary effort is normal.  Abdominal:     General: There is no distension.     Palpations: Abdomen is soft.     Tenderness: There is no abdominal tenderness.  Skin:    General: Skin is warm and dry.  Neurological:  Mental Status: She is alert and oriented to person, place, and time.  Psychiatric:        Mood and Affect: Mood normal.        Behavior: Behavior normal.       Imaging: IR Radiologist Eval & Mgmt Result Date: 01/22/2024 EXAM: NEW PATIENT OFFICE VISIT CHIEF COMPLAINT: SEE NOTE IN EPIC HISTORY OF PRESENT ILLNESS: SEE NOTE IN EPIC REVIEW OF SYSTEMS: SEE NOTE IN EPIC PHYSICAL EXAMINATION: SEE NOTE IN EPIC ASSESSMENT AND PLAN: SEE NOTE IN EPIC Electronically Signed   By: Malachy Moan M.D.   On: 01/22/2024 10:18   CT CHEST ABDOMEN PELVIS W CONTRAST Result Date: 01/08/2024 CLINICAL DATA:  Metastatic breast cancer, follow-up. * Tracking Code: BO * EXAM: CT CHEST, ABDOMEN, AND PELVIS WITH CONTRAST TECHNIQUE: Multidetector CT imaging of the chest, abdomen and pelvis was performed following the standard protocol during bolus administration of intravenous contrast. RADIATION DOSE REDUCTION: This exam was performed according to the departmental dose-optimization program which includes automated exposure control, adjustment of the mA and/or kV according to patient size and/or use of iterative reconstruction technique. CONTRAST:  OMNIPAQUE IOHEXOL 300 MG/ML  SOLN COMPARISON:  Multiple priors including PET-CT September 29, 2023 and CT Apr 15, 2023 FINDINGS: CT CHEST FINDINGS Cardiovascular: Left chest Port-A-Cath with tip near the superior cavoatrial junction. Normal  caliber thoracic aorta. No central pulmonary embolus on this nondedicated study. Normal size heart. No significant pericardial effusion/thickening. Mediastinum/Nodes: No suspicious thyroid nodule. No pathologically enlarged mediastinal, hilar or axillary lymph nodes. The esophagus is grossly unremarkable. Lungs/Pleura: Scattered bilateral pulmonary nodules are stable in size and number dating back to 10/27/2019 and considered benign, the largest of the nodules in the right upper lobe measuring 4 mm on image 48/6. No new suspicious pulmonary nodules or masses. Musculoskeletal: Prior modified right radical mastectomy and right axillary lymph node dissection with breast reconstruction/implant. No aggressive lytic or blastic lesion of bone. CT ABDOMEN PELVIS FINDINGS Hepatobiliary: Stable bilobar hypodense hepatic lesions which were not hypermetabolic on prior PET-CT. For reference: -lesion in the left lobe of the liver measures 2.2 x 1.2 cm on image 45/2, unchanged when remeasured for consistency. -lesion in the posterior right lobe of the liver measures 2.0 x 1.6 cm on image 45/2, unchanged when remeasured for consistency. No new suspicious hepatic lesion. Gallbladder is unremarkable. No biliary ductal dilation. Pancreas: No pancreatic ductal dilation or evidence of acute inflammation. Spleen: No splenomegaly. Adrenals/Urinary Tract: Bilateral adrenal glands appear normal. Hydronephrosis. Stable bilateral renal cysts and renal lesions technically too small to accurately characterize but statistically likely to reflect cysts. Urinary bladder is unremarkable for degree of distension. Stomach/Bowel: Stomach is unremarkable for degree of distension. No pathologic dilation of small or large bowel. No evidence of acute bowel inflammation. Moderate volume of formed stool in the colon. Normal appendix. Vascular/Lymphatic: Aortic atherosclerosis. Normal caliber abdominal aorta. Smooth IVC contours. The portal, splenic and  superior mesenteric veins are patent. No pathologically enlarged abdominal or pelvic lymph nodes. Reproductive: Similar nodular uterine contour with multiple masses compatible with leiomyomas. Other: No significant abdominopelvic free fluid. Musculoskeletal: No aggressive lytic or blastic lesion of bone. IMPRESSION: 1. Stable examination without evidence of new or progressive disease in the chest, abdomen or pelvis. 2. Stable bilobar hypodense hepatic lesions which were not hypermetabolic on prior PET-CT. 3. Scattered bilateral pulmonary nodules are stable in size and number dating back to 10/27/2019 and considered benign. 4.  Aortic Atherosclerosis (ICD10-I70.0). Electronically Signed   By: Tinnie Gens  Caprice Beaver M.D.   On: 01/08/2024 16:45    Labs:  CBC: Recent Labs    10/24/23 0757 11/14/23 0910 12/04/23 0928 01/15/24 0926  WBC 5.8 5.9 8.0 6.5  HGB 12.8 13.1 13.6 12.4  HCT 39.2 38.8 40.6 38.3  PLT 234 225 223 258    COAGS: Recent Labs    01/31/23 0630  INR 1.0    BMP: Recent Labs    10/24/23 0757 11/14/23 0910 12/04/23 0928 01/15/24 0926  NA 137 137 138 139  K 3.8 4.1 3.9 3.8  CL 105 106 105 107  CO2 25 24 27 26   GLUCOSE 95 129* 99 102*  BUN 23* 21* 14 18  CALCIUM 8.9 9.0 9.0 8.5*  CREATININE 0.71 0.71 0.69 0.72  GFRNONAA >60 >60 >60 >60    LIVER FUNCTION TESTS: Recent Labs    10/24/23 0757 11/14/23 0910 12/04/23 0928 01/15/24 0926  BILITOT 0.3 0.4 0.5 0.2  AST 13* 17 10* 11*  ALT 15 26 12 13   ALKPHOS 114 106 95 90  PROT 6.7 6.8 6.9 6.4*  ALBUMIN 4.1 4.3 4.1 4.0    TUMOR MARKERS: No results for input(s): "AFPTM", "CEA", "CA199", "CHROMGRNA" in the last 8760 hours.  Assessment and Plan:  Extremely pleasant 48 year old female with an unfortunate diagnosis of DCIS of the right breast in March 2020 status postresection followed by antiestrogen oral therapy and radiation therapy.  Unfortunately, she developed relapse/recurrence identified on MRI surveillance in  September 2022 with a new intermediate grade DCIS with ALH.  On subsequent surveillance imaging she was found to have metastatic disease to the liver consistent with stage IV disease.  She has no evidence of additional distal metastatic disease.  She has had an excellent response to therapy thus far but is quite young and has young children.  She is interested in aggressive therapy to achieve disease remission as long as possible.  Liver directed therapy options include percutaneous microwave ablation versus transarterial radioembolization.  To determine the best course of action I need to know if her hepatic lesions continue to enhance.  Evidence of enhancement (blood flow) would be essential to determine if transarterial radioembolization would be effective.  Additionally, evaluation of any residual active lesions is important for planning for potential microwave ablation therapy.  I believe she may be a candidate for a combination of microwave ablation and potentially transarterial radioembolization segmentectomy to achieve local control of her oligometastatic liver disease.  1.)  MRI abdomen with gadolinium contrast ASAP to assess size, number and enhancement of hepatic metastatic disease. 2.)  Schedule follow-up phone call or clinic visit to discuss the results of the MRI and plan our liver directed therapy strategy.  Thank you for this interesting consult.  I greatly enjoyed meeting Joann Meadows and look forward to participating in their care.  A copy of this report was sent to the requesting provider on this date.  Electronically Signed: Sterling Big 01/22/2024, 12:49 PM    I spent a total of  60 Minutes  in face to face in clinical consultation, greater than 50% of which was counseling/coordinating care for breast cancer metastatic to the liver

## 2024-01-24 ENCOUNTER — Ambulatory Visit (HOSPITAL_COMMUNITY)
Admission: RE | Admit: 2024-01-24 | Discharge: 2024-01-24 | Disposition: A | Discharge status: Home / Self Care | Payer: BC Managed Care – PPO | Source: Ambulatory Visit | Attending: Interventional Radiology | Admitting: Interventional Radiology

## 2024-01-24 DIAGNOSIS — C787 Secondary malignant neoplasm of liver and intrahepatic bile duct: Secondary | ICD-10-CM | POA: Insufficient documentation

## 2024-01-24 DIAGNOSIS — Z853 Personal history of malignant neoplasm of breast: Secondary | ICD-10-CM | POA: Diagnosis not present

## 2024-01-24 DIAGNOSIS — I700 Atherosclerosis of aorta: Secondary | ICD-10-CM | POA: Diagnosis not present

## 2024-01-24 DIAGNOSIS — C7981 Secondary malignant neoplasm of breast: Secondary | ICD-10-CM | POA: Diagnosis not present

## 2024-01-24 DIAGNOSIS — C50919 Malignant neoplasm of unspecified site of unspecified female breast: Secondary | ICD-10-CM | POA: Insufficient documentation

## 2024-01-24 MED ORDER — GADOBUTROL 1 MMOL/ML IV SOLN
8.0000 mL | Freq: Once | INTRAVENOUS | Status: AC | PRN
  Administered 2024-01-24: 8 mL via INTRAVENOUS

## 2024-01-29 ENCOUNTER — Ambulatory Visit
Admission: RE | Admit: 2024-01-29 | Discharge: 2024-01-29 | Disposition: A | Discharge status: Home / Self Care | Payer: BC Managed Care – PPO | Source: Ambulatory Visit | Attending: Interventional Radiology | Admitting: Interventional Radiology

## 2024-01-29 DIAGNOSIS — C50911 Malignant neoplasm of unspecified site of right female breast: Secondary | ICD-10-CM | POA: Diagnosis not present

## 2024-01-29 DIAGNOSIS — C50919 Malignant neoplasm of unspecified site of unspecified female breast: Secondary | ICD-10-CM

## 2024-01-29 DIAGNOSIS — C787 Secondary malignant neoplasm of liver and intrahepatic bile duct: Secondary | ICD-10-CM | POA: Diagnosis not present

## 2024-01-29 HISTORY — PX: IR RADIOLOGIST EVAL & MGMT: IMG5224

## 2024-01-29 NOTE — Progress Notes (Signed)
 Chief Complaint: Patient was consulted remotely today (TeleHealth) for breast cancer metastatic to the liver at the request of Abbiegail Landgren K.    Referring Physician(s): Quetzali Heinle K  History of Present Illness: Joann Meadows is a 48 y.o. female with an unfortunate diagnosis of stage IV metastatic breast cancer.  Her diagnosis began in March 2020 when she had a 1.6 cm right breast nodule excised with a pathologic diagnosis of DCIS.  Margins were clear.  She underwent standard radiation therapy and oral antiestrogen therapy.  Unfortunately, in 2022 she had evidence of recurrence on surveillance breast MRI and underwent right mastectomy and reconstruction.  Subsequent imaging surveillance is identified multifocal liver lesions consistent with metastatic disease.  No evidence of metastatic disease outside of the liver.   She is currently ron Herceptin and Perjeta.  Her most recent PET/CT imaging demonstrated no evidence of residual hypermetabolic disease.    Her most recent CT scan of the abdomen and Elvis referenced multiple "hypoattenuating liver lesions" that were unchanged raising concern that her hepatic metastatic disease was no longer hypermetabolic but remained present.  I ordered a follow-up MRI to more fully assess these lesions given that she also has a history of benign hemangiomas.  Fortunately, her MRI was able to shed light on her complex liver findings.  MRI 01/24/2024: There are multiple hepatic hemangiomas, somewhat complicating evaluation.  1. Response to therapy of hepatic metastasis since 01/07/2023 MRI.  2. Suspicion of a 9 mm hepatic dome metastasis which has enlarged from 2-3 mm on 01/07/2023. A more peripheral hepatic dome 5 mm hyperenhancing focus is indeterminate for new hemangioma versus tiny metastasis. 3. No evidence of extrahepatic abdominal metastasis.  Past Medical History:  Diagnosis Date   Breast cancer (HCC) 2020   Right Breast Cancer    Endometriosis    Fibroid    Hx of radiation therapy 06/2019   Leukemia (HCC)    20 years ago   Personal history of radiation therapy 2020   Right Breast Cancer   PONV (postoperative nausea and vomiting)    pt states she vomits after colonoscopies    Past Surgical History:  Procedure Laterality Date   BREAST EXCISIONAL BIOPSY Left 02/11/2019   [facility]   BREAST LUMPECTOMY Right 02/11/2019   BREAST LUMPECTOMY WITH RADIOACTIVE SEED LOCALIZATION Bilateral 02/11/2019   Procedure: BILATERAL BREAST LUMPECTOMIES WITH BILATERAL RADIOACTIVE SEED LOCALIZATION;  Surgeon: Manus Rudd, MD;  Location: [facility];  Service: General;  Laterality: Bilateral;   IR IMAGING GUIDED PORT INSERTION  02/25/2023   IR RADIOLOGIST EVAL & MGMT  02/27/2023   IR RADIOLOGIST EVAL & MGMT  01/22/2024   IR RADIOLOGIST EVAL & MGMT  01/29/2024   MASTECTOMY Right 11/2021    Allergies: Levaquin [levofloxacin], Tamoxifen citrate, and Tape  Medications: Prior to Admission medications   Medication Sig Start Date End Date Taking? Authorizing Provider  acetaminophen (TYLENOL) 325 MG tablet Take 650 mg by mouth every 6 (six) hours as needed.    [provider]  ALPRAZolam Prudy Feeler) 0.25 MG tablet 1 tablet Orally once a day if needed for anxiety/panic (not a daily medication, avoid regular use) for 30 days Patient not taking: Reported on 01/22/2024 01/14/23   [provider]  cholecalciferol (VITAMIN D3) 25 MCG (1000 UNIT) tablet Take 2,000 Units by mouth daily.    [provider]  fluticasone (FLONASE) 50 MCG/ACT nasal spray Place 1 spray into both nostrils daily.    [provider]  Ibuprofen (ADVIL) 200 MG  CAPS Take 1 capsule by mouth as needed.    [provider]  loratadine (CLARITIN) 10 MG tablet 1 tablet Orally Once a day    [provider]  metoprolol succinate (TOPROL XL) 25 MG 24 hr tablet Take 1 tablet (25 mg total) by mouth at bedtime. 12/02/23   Milford,  Anderson Malta, FNP  sacubitril-valsartan (ENTRESTO) 24-26 MG Take 1 tablet by mouth 2 (two) times daily. 12/02/23   [place]ford, Anderson Malta, FNP  Zinc-Vitamin C (ZINC-A-COLD/VITAMIN C MT) Use as directed 1 capsule in the mouth or throat daily.    [provider]     Family History  Problem Relation Age of Onset   Colon cancer Mother 57       d. 80   Hypertension Father    Diabetes Maternal Aunt    Diabetes Maternal Uncle    Mental retardation Maternal Uncle    Diabetes Paternal Aunt    Diabetes Maternal Grandmother    Stroke Paternal Grandmother        c. 39   Leukemia Paternal Grandfather        d. 51; ?CLL   Leukemia Cousin 4       mat first cousin   Lymphoma Cousin 40       pat first cousin    Social History   Socioeconomic History   Marital status: Single    Spouse name: Not on file   Number of children: 2   Years of education: Not on file   Highest education level: High school graduate  Occupational History   Not on file  Tobacco Use   Smoking status: Former    Current packs/day: 0.00    Types: Cigarettes    Start date: 01/24/2005    Quit date: 01/25/2015    Years since quitting: 9.0   Smokeless tobacco: Never  Vaping Use   Vaping status: Never Used  Substance and Sexual Activity   Alcohol use: No   Drug use: No   Sexual activity: Yes    Birth control/protection: None  Other Topics Concern   Not on file  Social History Narrative   Lives with children   Social Drivers of Health   Financial Resource Strain: Not on file  Food Insecurity: No Food Insecurity (10/03/2022)   Hunger Vital Sign    Worried About Running Out of Food in the Last Year: Never true    Ran Out of Food in the Last Year: Never true  Transportation Needs: No Transportation Needs (10/03/2022)   PRAPARE - Administrator, Civil Service (Medical): No    Lack of Transportation (Non-Medical): No  Physical Activity: Not on file  Stress: Not on file  Social Connections: Not on file     ECOG Status: 1 - Symptomatic but completely ambulatory  Review of Systems  Review of Systems: A 12 point ROS discussed and pertinent positives are indicated in the HPI above.  All other systems are negative.    Physical Exam No direct physical exam was performed (except for noted visual exam findings with Video Visits).    Vital Signs: There were no vitals taken for this visit.  Imaging: IR Radiologist Eval & Mgmt Result Date: 01/29/2024 EXAM: NEW PATIENT OFFICE VISIT CHIEF COMPLAINT: SEE EPIC NOTE HISTORY OF PRESENT ILLNESS: SEE EPIC NOTE REVIEW OF SYSTEMS: SEE EPIC NOTE PHYSICAL EXAMINATION: SEE EPIC NOTE ASSESSMENT AND PLAN: SEE EPIC NOTE Electronically Signed   By: Malachy Moan M.D.   On: 01/29/2024  11:34   MR ABDOMEN WWO CONTRAST Result Date: 01/29/2024 CLINICAL DATA:  History of breast cancer with known metastasis. Evaluate metastatic disease burden prior to possible localized therapy. EXAM: MRI ABDOMEN WITHOUT AND WITH CONTRAST TECHNIQUE: Multiplanar multisequence MR imaging of the abdomen was performed both before and after the administration of intravenous contrast. CONTRAST:  8mL GADAVIST GADOBUTROL 1 MMOL/ML IV SOLN COMPARISON:  01/08/2024 CT. 09/29/2023 PET. Most recent MRI 01/07/2023. FINDINGS: Lower chest: Normal heart size without pericardial or pleural effusion. Hepatobiliary: There are multiple hepatic hemangiomas, somewhat complicating evaluation. These were all non tracer avid on the PET of 01/21/2023 and are markedly T2 hyperintense. These are similar in size and distribution. The hepatic dome 2.3 cm lesion on the MRI of 01/07/2023 has markedly diminished, nearly resolved. There is a subtle 9 mm lesion on 15/10 which likely represents the residua. This was not hypermetabolic on most recent PET. The right liver lobe, pericholecystic lesion of 1.8 cm on 01/07/2023 MRI has resolved. A subcapsular hepatic dome focus of arterial phase hyperenhancement at 5 mm on 14/10 is  not readily apparent on the prior MRI. This persists on later post-contrast imaging and is equivocal for new hemangioma versus tiny metastasis. Just central to this, a hepatic dome 9 mm focus of arterial hyperenhancement on 19/10 has enlarged from 2-3 mm on image 16/13 of the 01/07/2023 MRI. This is only faintly visible on later post-contrast imaging (19/14) and subtly T2 hyperintense on 08/04. Suspicious for a tiny metastasis. Normal gallbladder, without biliary ductal dilatation. Pancreas:  Normal, without mass or ductal dilatation. Spleen:  Normal in size, without focal abnormality. Adrenals/Urinary Tract: Normal adrenal glands. Bilateral precontrast T1 hyperintense renal lesions including on series 9 and are nonenhancing and considered benign (Bosniak 2 cyst). No hydronephrosis. Stomach/Bowel: Normal stomach and abdominal bowel loops. Vascular/Lymphatic: Aortic atherosclerosis. No retroperitoneal or retrocrural adenopathy. Other:  No ascites. Musculoskeletal: No acute osseous abnormality. IMPRESSION: 1. Response to therapy of hepatic metastasis since 01/07/2023 MRI. Suspicion of a 9 mm hepatic dome metastasis which has enlarged from 2-3 mm on 01/07/2023. A more peripheral hepatic dome 5 mm hyperenhancing focus is indeterminate for new hemangioma versus tiny metastasis. 2. No evidence of extrahepatic abdominal metastasis. 3.  Aortic Atherosclerosis (ICD10-I70.0). Electronically Signed   By: Jeronimo Greaves M.D.   On: 01/29/2024 09:35   IR Radiologist Eval & Mgmt Result Date: 01/22/2024 EXAM: NEW PATIENT OFFICE VISIT CHIEF COMPLAINT: SEE NOTE IN EPIC HISTORY OF PRESENT ILLNESS: SEE NOTE IN EPIC REVIEW OF SYSTEMS: SEE NOTE IN EPIC PHYSICAL EXAMINATION: SEE NOTE IN EPIC ASSESSMENT AND PLAN: SEE NOTE IN EPIC Electronically Signed   By: Malachy Moan M.D.   On: 01/22/2024 10:18   CT CHEST ABDOMEN PELVIS W CONTRAST Result Date: 01/08/2024 CLINICAL DATA:  Metastatic breast cancer, follow-up. * Tracking Code: BO *  EXAM: CT CHEST, ABDOMEN, AND PELVIS WITH CONTRAST TECHNIQUE: Multidetector CT imaging of the chest, abdomen and pelvis was performed following the standard protocol during bolus administration of intravenous contrast. RADIATION DOSE REDUCTION: This exam was performed according to the departmental dose-optimization program which includes automated exposure control, adjustment of the mA and/or kV according to patient size and/or use of iterative reconstruction technique. CONTRAST:  OMNIPAQUE IOHEXOL 300 MG/ML  SOLN COMPARISON:  Multiple priors including PET-CT September 29, 2023 and CT Apr 15, 2023 FINDINGS: CT CHEST FINDINGS Cardiovascular: Left chest Port-A-Cath with tip near the superior cavoatrial junction. Normal caliber thoracic aorta. No central pulmonary embolus on this nondedicated study. Normal size  heart. No significant pericardial effusion/thickening. Mediastinum/Nodes: No suspicious thyroid nodule. No pathologically enlarged mediastinal, hilar or axillary lymph nodes. The esophagus is grossly unremarkable. Lungs/Pleura: Scattered bilateral pulmonary nodules are stable in size and number dating back to 10/27/2019 and considered benign, the largest of the nodules in the right upper lobe measuring 4 mm on image 48/6. No new suspicious pulmonary nodules or masses. Musculoskeletal: Prior modified right radical mastectomy and right axillary lymph node dissection with breast reconstruction/implant. No aggressive lytic or blastic lesion of bone. CT ABDOMEN PELVIS FINDINGS Hepatobiliary: Stable bilobar hypodense hepatic lesions which were not hypermetabolic on prior PET-CT. For reference: -lesion in the left lobe of the liver measures 2.2 x 1.2 cm on image 45/2, unchanged when remeasured for consistency. -lesion in the posterior right lobe of the liver measures 2.0 x 1.6 cm on image 45/2, unchanged when remeasured for consistency. No new suspicious hepatic lesion. Gallbladder is unremarkable. No biliary ductal  dilation. Pancreas: No pancreatic ductal dilation or evidence of acute inflammation. Spleen: No splenomegaly. Adrenals/Urinary Tract: Bilateral adrenal glands appear normal. Hydronephrosis. Stable bilateral renal cysts and renal lesions technically too small to accurately characterize but statistically likely to reflect cysts. Urinary bladder is unremarkable for degree of distension. Stomach/Bowel: Stomach is unremarkable for degree of distension. No pathologic dilation of small or large bowel. No evidence of acute bowel inflammation. Moderate volume of formed stool in the colon. Normal appendix. Vascular/Lymphatic: Aortic atherosclerosis. Normal caliber abdominal aorta. Smooth IVC contours. The portal, splenic and superior mesenteric veins are patent. No pathologically enlarged abdominal or pelvic lymph nodes. Reproductive: Similar nodular uterine contour with multiple masses compatible with leiomyomas. Other: No significant abdominopelvic free fluid. Musculoskeletal: No aggressive lytic or blastic lesion of bone. IMPRESSION: 1. Stable examination without evidence of new or progressive disease in the chest, abdomen or pelvis. 2. Stable bilobar hypodense hepatic lesions which were not hypermetabolic on prior PET-CT. 3. Scattered bilateral pulmonary nodules are stable in size and number dating back to 10/27/2019 and considered benign. 4.  Aortic Atherosclerosis (ICD10-I70.0). Electronically Signed   By: Maudry Mayhew M.D.   On: 01/08/2024 16:45    Labs:  CBC: Recent Labs    10/24/23 0757 11/14/23 0910 12/04/23 0928 01/15/24 0926  WBC 5.8 5.9 8.0 6.5  HGB 12.8 13.1 13.6 12.4  HCT 39.2 38.8 40.6 38.3  PLT 234 225 223 258    COAGS: Recent Labs    01/31/23 0630  INR 1.0    BMP: Recent Labs    10/24/23 0757 11/14/23 0910 12/04/23 0928 01/15/24 0926  NA 137 137 138 139  K 3.8 4.1 3.9 3.8  CL 105 106 105 107  CO2 25 24 27 26   GLUCOSE 95 129* 99 102*  BUN 23* 21* 14 18  CALCIUM 8.9 9.0  9.0 8.5*  CREATININE 0.71 0.71 0.69 0.72  GFRNONAA >60 >60 >60 >60    LIVER FUNCTION TESTS: Recent Labs    10/24/23 0757 11/14/23 0910 12/04/23 0928 01/15/24 0926  BILITOT 0.3 0.4 0.5 0.2  AST 13* 17 10* 11*  ALT 15 26 12 13   ALKPHOS 114 106 95 90  PROT 6.7 6.8 6.9 6.4*  ALBUMIN 4.1 4.3 4.1 4.0    TUMOR MARKERS: No results for input(s): "AFPTM", "CEA", "CA199", "CHROMGRNA" in the last 8760 hours.  Assessment and Plan:  Very pleasant 48 year old female with a history of breast cancer metastatic to the liver.  Her imaging is quite complex.  She has multiple benign hemangiomas in the liver which  have been variably described appropriately as benign hemangiomas on some studies, and nonspecifically described as "stable hypoattenuating lesions" on other exams.  Comparing across multiple prior studies over the last 6 months including prior PET CTs and MRIs helps identify that the majority of the lesions present are in fact benign hemangiomas.  The 2 previously confirmed metastatic lesions have essentially completely resolved following therapy.  There is no definite residual target left for liver directed therapy.  However, there are 2 new small subcentimeter lesions in the right liver which are seen on today's MRI and not clearly seen on prior imaging.  These may represent 2 small flash fill hemangiomas which are identified today due to different timing of the MR images compared to the contrast injection, or they could conceivably represent 2 new small metastatic lesions.  Unfortunately, they are too small to biopsy at this time and therefore close imaging surveillance is recommended.  We will pursue a follow-up MRI with gadolinium contrast in 3 months.  1.)  Follow-up MRI of the abdomen with gadolinium in 3 months followed by a clinic visit.   Electronically Signed: Sterling Big 01/29/2024, 12:49 PM   I spent a total of 15 Minutes in remote  clinical consultation, greater than  50% of which was counseling/coordinating care for breast cancer mets to the liver.    Visit type: Audio and video Tourist information centre manager Conferenceing).   Alternative for in-person consultation at [facility], [address] [place], [place], [place]. This visit type was conducted due to national recommendations for restrictions regarding the COVID-19 Pandemic (e.g. social distancing).  This format is felt to be most appropriate for this patient at this time.  All issues noted in this document were discussed and addressed.

## 2024-01-30 ENCOUNTER — Ambulatory Visit (HOSPITAL_COMMUNITY)
Admission: RE | Admit: 2024-01-30 | Discharge: 2024-01-30 | Disposition: A | Discharge status: Home / Self Care | Payer: BC Managed Care – PPO | Source: Ambulatory Visit | Attending: Internal Medicine | Admitting: Internal Medicine

## 2024-01-30 ENCOUNTER — Ambulatory Visit (HOSPITAL_BASED_OUTPATIENT_CLINIC_OR_DEPARTMENT_OTHER)
Admission: RE | Admit: 2024-01-30 | Discharge: 2024-01-30 | Disposition: A | Discharge status: Home / Self Care | Payer: BC Managed Care – PPO | Source: Ambulatory Visit | Attending: Internal Medicine | Admitting: Internal Medicine

## 2024-01-30 ENCOUNTER — Encounter (HOSPITAL_COMMUNITY): Payer: Self-pay | Admitting: Internal Medicine

## 2024-01-30 VITALS — BP 140/88 | HR 72 | Wt 175.6 lb

## 2024-01-30 DIAGNOSIS — R0683 Snoring: Secondary | ICD-10-CM | POA: Insufficient documentation

## 2024-01-30 DIAGNOSIS — I427 Cardiomyopathy due to drug and external agent: Secondary | ICD-10-CM | POA: Diagnosis not present

## 2024-01-30 DIAGNOSIS — R0609 Other forms of dyspnea: Secondary | ICD-10-CM | POA: Diagnosis not present

## 2024-01-30 DIAGNOSIS — I10 Essential (primary) hypertension: Secondary | ICD-10-CM

## 2024-01-30 DIAGNOSIS — Z853 Personal history of malignant neoplasm of breast: Secondary | ICD-10-CM | POA: Insufficient documentation

## 2024-01-30 DIAGNOSIS — T451X5A Adverse effect of antineoplastic and immunosuppressive drugs, initial encounter: Secondary | ICD-10-CM

## 2024-01-30 LAB — ECHOCARDIOGRAM COMPLETE
Area-P 1/2: 3.36 cm2
Calc EF: 57.5 %
S' Lateral: 3.3 cm
Single Plane A2C EF: 58.7 %
Single Plane A4C EF: 52 %

## 2024-01-30 MED ORDER — SACUBITRIL-VALSARTAN 49-51 MG PO TABS: 1.0000 | ORAL_TABLET | Freq: Two times a day (BID) | ORAL | 11 refills | Status: DC

## 2024-01-30 NOTE — Progress Notes (Signed)
 CARDIO-ONCOLOGY CLINIC NOTE  Referring Physician: Primary Care: Primary Cardiologist:  HPI:  Joann Meadows is 48 y.o. female with breast cancer referred by Dr. Pamelia Meadows for enrollment into the Cardio-Oncology program.  Treated for AML 09/1998 treated with 2 induction regimens (including doxrubicin) followed by 6 consolidation regimens, remission   Diagnosed with R breast CA DCIS in 3/20 ER/PR+  Underwent lumpectomy Treated with tamoxifen and XRT  Relapse in 9/22   9/23 found to have Stage IV CA in 3/24 started DOCEtaxel + Trastuzumab + Pertuzumab (THP) q21d x 8 cycles / Trastuzumab + Pertuzumab q21d x 4 cycles   PET/CT: 06/19/2023: Interval resolution of the hypermetabolic lesions in the liver. PET/CT 10/03/2023: No evidence of recurrent/metastatic cancer low-density liver lesions less conspicuous without any hypermetabolic activity   ECHO:  7/24 EF 55-60% GLS -18.0  ECHO 11/24 50-55% GLS -13.4 (poor tracking)  POCUS ECHO 11/21/23 EF 50-55%  Here for f/u. Remains on herceptin/perjeta. Feels much better after stopping metoprolol. No CP, SOB, orthopnea or PND. Less fatigued. SBP > 140 at times  Echo today 01/30/24 EF 55-60% GLS -19.2% Personally reviewed   Past Medical History:  Diagnosis Date   Breast cancer (HCC) 2020   Right Breast Cancer   Endometriosis    Fibroid    Hx of radiation therapy 06/2019   Leukemia (HCC)    20 years ago   Personal history of radiation therapy 2020   Right Breast Cancer   PONV (postoperative nausea and vomiting)    pt states she vomits after colonoscopies    Current Outpatient Medications  Medication Sig Dispense Refill   acetaminophen (TYLENOL) 325 MG tablet Take 650 mg by mouth every 6 (six) hours as needed.     ALPRAZolam (XANAX) 0.25 MG tablet      cholecalciferol (VITAMIN D3) 25 MCG (1000 UNIT) tablet Take 2,000 Units by mouth daily.     fluticasone (FLONASE) 50 MCG/ACT nasal spray Place 1 spray into both nostrils daily.     Ibuprofen  (ADVIL) 200 MG CAPS Take 1 capsule by mouth as needed.     loratadine (CLARITIN) 10 MG tablet 1 tablet Orally Once a day     phenylephrine (SUDAFED PE) 10 MG TABS tablet Take 10 mg by mouth as needed.     sacubitril-valsartan (ENTRESTO) 24-26 MG Take 1 tablet by mouth 2 (two) times daily. 60 tablet 11   Zinc-Vitamin C (ZINC-A-COLD/VITAMIN C MT) Use as directed 1 capsule in the mouth or throat daily.     metoprolol succinate (TOPROL XL) 25 MG 24 hr tablet Take 1 tablet (25 mg total) by mouth at bedtime. (Patient not taking: Reported on 01/30/2024) 30 tablet 3   No current facility-administered medications for this encounter.    Allergies  Allergen Reactions   Levaquin [Levofloxacin] Swelling    Swelling feet and hands   Tamoxifen Citrate     Other reaction(s): depression, joint pain   Tape Rash      Social History   Socioeconomic History   Marital status: Single    Spouse name: Not on file   Number of children: 2   Years of education: Not on file   Highest education level: High school graduate  Occupational History   Not on file  Tobacco Use   Smoking status: Former    Current packs/day: 0.00    Types: Cigarettes    Start date: 01/24/2005    Quit date: 01/25/2015    Years since quitting: 9.0   Smokeless tobacco:  Never  Vaping Use   Vaping status: Never Used  Substance and Sexual Activity   Alcohol use: No   Drug use: No   Sexual activity: Yes    Birth control/protection: None  Other Topics Concern   Not on file  Social History Narrative   Lives with children   Social Drivers of Health   Financial Resource Strain: Not on file  Food Insecurity: No Food Insecurity (10/03/2022)   Hunger Vital Sign    Worried About Running Out of Food in the Last Year: Never true    Ran Out of Food in the Last Year: Never true  Transportation Needs: No Transportation Needs (10/03/2022)   PRAPARE - Administrator, Civil Service (Medical): No    Lack of Transportation  (Non-Medical): No  Physical Activity: Not on file  Stress: Not on file  Social Connections: Not on file  Intimate Partner Violence: Not At Risk (02/26/2019)   Humiliation, Afraid, Rape, and Kick questionnaire    Fear of Current or Ex-Partner: No    Emotionally Abused: No    Physically Abused: No    Sexually Abused: No      Family History  Problem Relation Age of Onset   Colon cancer Mother 31       d. 89   Hypertension Father    Diabetes Maternal Aunt    Diabetes Maternal Uncle    Mental retardation Maternal Uncle    Diabetes Paternal Aunt    Diabetes Maternal Grandmother    Stroke Paternal Grandmother        c. 56   Leukemia Paternal Grandfather        d. 55; ?CLL   Leukemia Cousin 4       mat first cousin   Lymphoma Cousin 40       pat first cousin    Vitals:   01/30/24 1208  BP: (!) 140/88  Pulse: 72  SpO2: 98%  Weight: 79.7 kg (175 lb 9.6 oz)    PHYSICAL EXAM: General:  Well appearing. No resp difficulty HEENT: normal Neck: supple. no JVD. Carotids 2+ bilat; no bruits. No lymphadenopathy or thryomegaly appreciated. Cor: PMI nondisplaced. Regular rate & rhythm. No rubs, gallops or murmurs. Lungs: clear Abdomen: soft, nontender, nondistended. No hepatosplenomegaly. No bruits or masses. Good bowel sounds. Extremities: no cyanosis, clubbing, rash, edema Neuro: alert & orientedx3, cranial nerves grossly intact. moves all 4 extremities w/o difficulty. Affect pleasant   ASSESSMENT & PLAN:  1. Right Breast Cancer - stage IV - Treated for AML 09/1998 treated with 2 induction regimens (including doxrubicin) followed by 6 consolidation regimens, remission  - Diagnosed with R breast CA DCIS in 3/20 ER/PR+  Underwent lumpectomy Treated with tamoxifen and XRT - Relapse in 9/22  - 9/23 found to have Stage IV CA in 3/24 started DOCEtaxel + Trastuzumab + Pertuzumab (THP) q21d x 8 cycles / Trastuzumab + Pertuzumab q21d x 4 cycles  - remains on Herceptin/Perjeta  2.  Potential chemotherapy-induced cardiomyopathy - LV function low end of normal range - ECHO:  7/24 EF 55-60% GLS -18.0  - ECHO 10/01/23 50-55% GLS -13.4 (poor tracking) - POCUS echo t12/27/24 in clinic EF stable, 50-55%  - Echo today 01/30/24 EF 55-60% GLS -19.2% Personally reviewed - Doing well NYHA I - Increase Entresto to 49/51 bid - Off Toprol due to fatigue - Continue Herceptin/Perjeta  3. Hypertension  - SBPs running high - Increase Entresto  4. Fatigue/snoring  - sleep study pending (has  HST)   Joann Meres, MD  12:35 PM

## 2024-01-30 NOTE — Patient Instructions (Signed)
 CHANGE Entresto to 49/51 mg Twice daily  Your physician has requested that you have an echocardiogram. Echocardiography is a painless test that uses sound waves to create images of your heart. It provides your doctor with information about the size and shape of your heart and how well your heart's chambers and valves are working. This procedure takes approximately one hour. There are no restrictions for this procedure. Please do NOT wear cologne, perfume, aftershave, or lotions (deodorant is allowed). Please arrive 15 minutes prior to your appointment time.  Please note: We ask at that you not bring children with you during ultrasound (echo/ vascular) testing. Due to room size and safety concerns, children are not allowed in the ultrasound rooms during exams. Our front office staff cannot provide observation of children in our lobby area while testing is being conducted. An adult accompanying a patient to their appointment will only be allowed in the ultrasound room at the discretion of the ultrasound technician under special circumstances. We apologize for any inconvenience.  Your physician recommends that you schedule a follow-up appointment in: 3 months with echocardiogram (June) ** PLEASE CALL THE OFFICE IN APRIL TO ARRANGE YOUR FOLLOW UP APPOINTMENT.**  If you have any questions or concerns before your next appointment please send [place] a message through [place] or call our office at [phone].    TO LEAVE A MESSAGE FOR THE NURSE SELECT OPTION 2, PLEASE LEAVE A MESSAGE INCLUDING: YOUR NAME DATE OF BIRTH CALL BACK NUMBER REASON FOR CALL**this is important as we prioritize the call backs  YOU WILL RECEIVE A CALL BACK THE SAME DAY AS LONG AS YOU CALL BEFORE 4:00 PM  At the [facility], you and your health needs are our priority. As part of our continuing mission to provide you with exceptional heart care, we have created designated Provider Care Teams. These Care Teams include  your primary Cardiologist (physician) and Advanced Practice Providers (APPs- Physician Assistants and Nurse Practitioners) who all work together to provide you with the care you need, when you need it.   You may see any of the following providers on your designated Care Team at your next follow up: Dr Arvilla Meres Dr Marca Ancona Dr. Dorthula Nettles Dr. Clearnce Hasten Amy Filbert Schilder, NP Robbie Lis, [place] [facility] [place], [place] Brynda Peon, NP [place] Lee, NP Clarisa Kindred, NP Karle Plumber, PharmD Enos Fling, PharmD   Please be sure to bring in all your medications bottles to every appointment.    Thank you for choosing [facility]

## 2024-02-03 ENCOUNTER — Other Ambulatory Visit: Payer: Self-pay | Admitting: Hematology and Oncology

## 2024-02-03 ENCOUNTER — Other Ambulatory Visit: Payer: Self-pay | Admitting: *Deleted

## 2024-02-03 DIAGNOSIS — C50919 Malignant neoplasm of unspecified site of unspecified female breast: Secondary | ICD-10-CM

## 2024-02-03 DIAGNOSIS — K769 Liver disease, unspecified: Secondary | ICD-10-CM

## 2024-02-03 NOTE — Progress Notes (Signed)
 Verbal orders received and placed to obtain PET scan for further evaluation of new liver nodules.  Orders placed, appt will be scheduled once PA is obtained.

## 2024-02-04 ENCOUNTER — Telehealth: Payer: Self-pay | Admitting: Hematology and Oncology

## 2024-02-04 NOTE — Telephone Encounter (Signed)
 Scheduled appointments per WQ. Patient is aware of the made appointments.

## 2024-02-05 ENCOUNTER — Inpatient Hospital Stay: Payer: BC Managed Care – PPO | Attending: Hematology and Oncology

## 2024-02-05 ENCOUNTER — Ambulatory Visit: Payer: BC Managed Care – PPO | Admitting: Hematology and Oncology

## 2024-02-05 ENCOUNTER — Ambulatory Visit: Payer: BC Managed Care – PPO

## 2024-02-05 VITALS — BP 128/78 | HR 72 | Temp 98.2°F | Resp 16 | Wt 175.2 lb

## 2024-02-05 DIAGNOSIS — Z5112 Encounter for antineoplastic immunotherapy: Secondary | ICD-10-CM | POA: Diagnosis not present

## 2024-02-05 DIAGNOSIS — Z1721 Progesterone receptor positive status: Secondary | ICD-10-CM | POA: Insufficient documentation

## 2024-02-05 DIAGNOSIS — D0511 Intraductal carcinoma in situ of right breast: Secondary | ICD-10-CM | POA: Insufficient documentation

## 2024-02-05 DIAGNOSIS — R918 Other nonspecific abnormal finding of lung field: Secondary | ICD-10-CM | POA: Insufficient documentation

## 2024-02-05 DIAGNOSIS — Z79899 Other long term (current) drug therapy: Secondary | ICD-10-CM | POA: Insufficient documentation

## 2024-02-05 DIAGNOSIS — Z170 Estrogen receptor positive status [ER+]: Secondary | ICD-10-CM | POA: Diagnosis not present

## 2024-02-05 DIAGNOSIS — C50919 Malignant neoplasm of unspecified site of unspecified female breast: Secondary | ICD-10-CM

## 2024-02-05 DIAGNOSIS — C787 Secondary malignant neoplasm of liver and intrahepatic bile duct: Secondary | ICD-10-CM | POA: Insufficient documentation

## 2024-02-05 MED ORDER — TRASTUZUMAB-DKST CHEMO 150 MG IV SOLR
6.0000 mg/kg | Freq: Once | INTRAVENOUS | Status: AC
  Administered 2024-02-05: 483 mg via INTRAVENOUS
  Filled 2024-02-05: qty 23

## 2024-02-05 MED ORDER — SODIUM CHLORIDE 0.9% FLUSH
10.0000 mL | INTRAVENOUS | Status: DC | PRN
  Administered 2024-02-05: 10 mL

## 2024-02-05 MED ORDER — ACETAMINOPHEN 325 MG PO TABS
650.0000 mg | ORAL_TABLET | Freq: Once | ORAL | Status: AC
  Administered 2024-02-05: 650 mg via ORAL
  Filled 2024-02-05: qty 2

## 2024-02-05 MED ORDER — SODIUM CHLORIDE 0.9 % IV SOLN
420.0000 mg | Freq: Once | INTRAVENOUS | Status: AC
  Administered 2024-02-05: 420 mg via INTRAVENOUS
  Filled 2024-02-05: qty 14

## 2024-02-05 MED ORDER — HEPARIN SOD (PORK) LOCK FLUSH 100 UNIT/ML IV SOLN
500.0000 [IU] | Freq: Once | INTRAVENOUS | Status: AC | PRN
  Administered 2024-02-05: 500 [IU]

## 2024-02-05 MED ORDER — SODIUM CHLORIDE 0.9 % IV SOLN
INTRAVENOUS | Status: DC
Start: 2024-02-05 — End: 2024-02-05

## 2024-02-05 MED ORDER — DIPHENHYDRAMINE HCL 25 MG PO CAPS
50.0000 mg | ORAL_CAPSULE | Freq: Once | ORAL | Status: AC
  Administered 2024-02-05: 50 mg via ORAL
  Filled 2024-02-05: qty 2

## 2024-02-05 NOTE — Patient Instructions (Signed)
 [facility] - A DEPT OF [facility]  Discharge Instructions: Thank you for choosing [facility] to provide your oncology and hematology care.   If you have a lab appointment with the Cancer Center, please go directly to the Cancer Center and check in at the registration area.   Wear comfortable clothing and clothing appropriate for easy access to any Portacath or PICC line.   We strive to give you quality time with your provider. You may need to reschedule your appointment if you arrive late (15 or more minutes).  Arriving late affects you and other patients whose appointments are after yours.  Also, if you miss three or more appointments without notifying the office, you may be dismissed from the clinic at the provider's discretion.      For prescription refill requests, have your pharmacy contact our office and allow 72 hours for refills to be completed.    Today you received the following chemotherapy and/or immunotherapy agents: Ogivri/Perjeta      To help prevent nausea and vomiting after your treatment, we encourage you to take your nausea medication as directed.  BELOW ARE SYMPTOMS THAT SHOULD BE REPORTED IMMEDIATELY: *FEVER GREATER THAN 100.4 F (38 C) OR HIGHER *CHILLS OR SWEATING *NAUSEA AND VOMITING THAT IS NOT CONTROLLED WITH YOUR NAUSEA MEDICATION *UNUSUAL SHORTNESS OF BREATH *UNUSUAL BRUISING OR BLEEDING *URINARY PROBLEMS (pain or burning when urinating, or frequent urination) *BOWEL PROBLEMS (unusual diarrhea, constipation, pain near the anus) TENDERNESS IN MOUTH AND THROAT WITH OR WITHOUT PRESENCE OF ULCERS (sore throat, sores in mouth, or a toothache) UNUSUAL RASH, SWELLING OR PAIN  UNUSUAL VAGINAL DISCHARGE OR ITCHING   Items with * indicate a potential emergency and should be followed up as soon as possible or go to the Emergency Department if any problems should occur.  Please show the CHEMOTHERAPY ALERT CARD or  IMMUNOTHERAPY ALERT CARD at check-in to the Emergency Department and triage nurse.  Should you have questions after your visit or need to cancel or reschedule your appointment, please contact [facility] - A DEPT OF [facility]  Dept: [phone]  and follow the prompts.  Office hours are 8:00 a.m. to 4:30 p.m. Monday - Friday. Please note that voicemails left after 4:00 p.m. may not be returned until the following business day.  We are closed weekends and major holidays. You have access to a nurse at all times for urgent questions. Please call the main number to the clinic Dept: [phone] and follow the prompts.   For any non-urgent questions, you may also contact your provider using MyChart. We now offer e-Visits for anyone 54 and older to request care online for non-urgent symptoms. For details visit [email or web address].   Also download the MyChart app! Go to the app store, search "MyChart", open the app, select [facility], and log in with your MyChart username and password.

## 2024-02-11 ENCOUNTER — Encounter (HOSPITAL_COMMUNITY)
Admission: RE | Admit: 2024-02-11 | Discharge: 2024-02-11 | Disposition: A | Discharge status: Home / Self Care | Source: Ambulatory Visit | Attending: Hematology and Oncology | Admitting: Hematology and Oncology

## 2024-02-11 DIAGNOSIS — K769 Liver disease, unspecified: Secondary | ICD-10-CM | POA: Insufficient documentation

## 2024-02-11 DIAGNOSIS — Z853 Personal history of malignant neoplasm of breast: Secondary | ICD-10-CM | POA: Diagnosis not present

## 2024-02-11 DIAGNOSIS — I700 Atherosclerosis of aorta: Secondary | ICD-10-CM | POA: Diagnosis not present

## 2024-02-11 DIAGNOSIS — R918 Other nonspecific abnormal finding of lung field: Secondary | ICD-10-CM | POA: Diagnosis not present

## 2024-02-11 DIAGNOSIS — K7689 Other specified diseases of liver: Secondary | ICD-10-CM | POA: Insufficient documentation

## 2024-02-11 LAB — GLUCOSE, CAPILLARY: Glucose-Capillary: 106 mg/dL — ABNORMAL HIGH (ref 70–99)

## 2024-02-11 MED ORDER — FLUDEOXYGLUCOSE F - 18 (FDG) INJECTION
8.7000 | Freq: Once | INTRAVENOUS | Status: AC | PRN
  Administered 2024-02-11: 8.7 via INTRAVENOUS

## 2024-02-13 ENCOUNTER — Encounter (HOSPITAL_COMMUNITY): Payer: Self-pay | Admitting: *Deleted

## 2024-02-17 ENCOUNTER — Inpatient Hospital Stay: Admitting: Hematology and Oncology

## 2024-02-17 VITALS — BP 140/88 | HR 81 | Temp 98.7°F | Resp 17 | Ht 65.0 in | Wt 174.4 lb

## 2024-02-17 DIAGNOSIS — D0511 Intraductal carcinoma in situ of right breast: Secondary | ICD-10-CM | POA: Diagnosis not present

## 2024-02-17 DIAGNOSIS — C787 Secondary malignant neoplasm of liver and intrahepatic bile duct: Secondary | ICD-10-CM | POA: Diagnosis not present

## 2024-02-17 DIAGNOSIS — C50919 Malignant neoplasm of unspecified site of unspecified female breast: Secondary | ICD-10-CM

## 2024-02-17 DIAGNOSIS — Z1721 Progesterone receptor positive status: Secondary | ICD-10-CM | POA: Diagnosis not present

## 2024-02-17 DIAGNOSIS — Z5112 Encounter for antineoplastic immunotherapy: Secondary | ICD-10-CM | POA: Diagnosis not present

## 2024-02-17 DIAGNOSIS — R918 Other nonspecific abnormal finding of lung field: Secondary | ICD-10-CM | POA: Diagnosis not present

## 2024-02-17 DIAGNOSIS — Z170 Estrogen receptor positive status [ER+]: Secondary | ICD-10-CM | POA: Diagnosis not present

## 2024-02-17 DIAGNOSIS — Z79899 Other long term (current) drug therapy: Secondary | ICD-10-CM | POA: Diagnosis not present

## 2024-02-17 MED ORDER — FLUCONAZOLE 100 MG PO TABS: 100.0000 mg | ORAL_TABLET | Freq: Every day | ORAL | 1 refills | Status: DC

## 2024-02-17 NOTE — Progress Notes (Signed)
 Patient Care Team: [name], MD as PCP - General (Family Medicine) Donnelly Angelica, RN as Oncology Nurse Navigator Pershing Proud, RN as Oncology Nurse Navigator Serena Croissant, MD as Consulting Physician (Hematology and Oncology) Default, Provider, MD as Technician  DIAGNOSIS:  Encounter Diagnosis  Name Primary?   Carcinoma of breast metastatic to liver, unspecified laterality (HCC) Yes    SUMMARY OF ONCOLOGIC HISTORY: Oncology History  Breast cancer metastasized to liver (HCC)  01/25/2019 Initial Diagnosis   Screening mammogram showed bilateral calcifications right breast 5 mm, pleomorphic.  Left breast 1.6 cm punctate, smaller group of calcifications spanning 3 mm.  Right lower outer quadrant biopsy revealed low-grade DCIS ER 90%, PR 70%; biopsy of left breast calcifications ALH and PASH   01/31/2019 Genetic Testing   ATM c.8495G>A and RECQL4 c.2587G>A VUS identified on the 9-gene STAT panel and Multi-cancer panel through [facility].  The STAT Breast cancer panel offered by [facility] includes sequencing and rearrangement analysis for the following 9 genes:  ATM, BRCA1, BRCA2, CDH1, CHEK2, PALB2, PTEN, STK11 and TP53.   The Multi-Gene Panel offered by [facility] includes sequencing and/or deletion duplication testing of the following 84 genes: AIP, ALK, APC, ATM, AXIN2,BAP1,  BARD1, BLM, BMPR1A, BRCA1, BRCA2, BRIP1, CASR, CDC73, CDH1, CDK4, CDKN1B, CDKN1C, CDKN2A (p14ARF), CDKN2A (p16INK4a), CEBPA, CHEK2, CTNNA1, DICER1, DIS3L2, EGFR (c.2369C>T, p.Thr790Met variant only), EPCAM (Deletion/duplication testing only), FH, FLCN, GATA2, GPC3, GREM1 (Promoter region deletion/duplication testing only), HOXB13 (c.251G>A, p.Gly84Glu), HRAS, KIT, MAX, MEN1, MET, MITF (c.952G>A, p.Glu318Lys variant only), MLH1, MSH2, MSH3, MSH6, MUTYH, NBN, NF1, NF2, NTHL1, PALB2, PDGFRA, PHOX2B, PMS2, POLD1, POLE, POT1, PRKAR1A, PTCH1, PTEN, RAD50, RAD51C, RAD51D, RB1, RECQL4, RET, RUNX1, SDHAF2, SDHA (sequence changes  only), SDHB, SDHC, SDHD, SMAD4, SMARCA4, SMARCB1, SMARCE1, STK11, SUFU, TERC, TERT, TMEM127, TP53, TSC1, TSC2, VHL, WRN and WT1.  he report date is February 02, 2019.   02/11/2019 Surgery   RT lumpectomy: DICS Intermediate grade, Margins Neg, ER 90%, PR 70%; Lt Lumpectomy: UDH   02/26/2019 -  Anti-estrogen oral therapy   Tamoxifen 20mg  daily, stopped because of depression and facial numbness   05/20/2019 - 06/17/2019 Radiation Therapy   Adjuvant XRT   08/15/2021 Relapse/Recurrence   MRI surveillance detected Right breast calcifications 5 cm, Ant biopsy: IG DCIS with necrosis, ER 40%, PR 0%, Posterior biopsy: Intermediate grade DCIS with [facility]   08/19/2022 Cancer Staging   Staging form: Breast, AJCC 8th Edition - Clinical: Stage IV (cT106mi, cNX, cM1, G3, ER: Unknown, PR: Unknown, HER2: Unknown) - Signed by Serena Croissant, MD on 02/06/2023 Histologic grading system: 3 grade system   02/14/2023 - 01/15/2024 Chemotherapy   Patient is on Treatment Plan : BREAST DOCEtaxel + Trastuzumab + Pertuzumab (THP) q21d x 8 cycles / Trastuzumab + Pertuzumab q21d x 4 cycles     02/05/2024 -  Chemotherapy   Patient is on Treatment Plan : BREAST Trastuzumab  + Pertuzumab q21d x 13 cycles     AML (acute myeloid leukemia) (HCC)  09/1998 Initial Diagnosis   AML (acute myeloid leukemia) (HCC) treated with 2 induction regimens followed by 6 consolidation regimens, remission     CHIEF COMPLIANT: Follow-up after recent scans  HISTORY OF PRESENT ILLNESS:   History of Present Illness The patient, with a history of breast cancer, presents for a follow-up visit after recent PET/CT scans. The patient has been undergoing treatment with Herceptin and has been responding well. The patient expresses some anxiety and uncertainty about these findings, as she was hoping for a clear,  definitive result.  She reports no new side effects or concerns.  ALLERGIES:  is allergic to levaquin [levofloxacin], tamoxifen citrate, and  tape.  MEDICATIONS:  Current Outpatient Medications  Medication Sig Dispense Refill   fluconazole (DIFLUCAN) 100 MG tablet Take 1 tablet (100 mg total) by mouth daily. 15 tablet 1   acetaminophen (TYLENOL) 325 MG tablet Take 650 mg by mouth every 6 (six) hours as needed.     ALPRAZolam (XANAX) 0.25 MG tablet      cholecalciferol (VITAMIN D3) 25 MCG (1000 UNIT) tablet Take 2,000 Units by mouth daily.     fluticasone (FLONASE) 50 MCG/ACT nasal spray Place 1 spray into both nostrils daily.     Ibuprofen (ADVIL) 200 MG CAPS Take 1 capsule by mouth as needed.     loratadine (CLARITIN) 10 MG tablet 1 tablet Orally Once a day     metoprolol succinate (TOPROL XL) 25 MG 24 hr tablet Take 1 tablet (25 mg total) by mouth at bedtime. (Patient not taking: Reported on 01/30/2024) 30 tablet 3   phenylephrine (SUDAFED PE) 10 MG TABS tablet Take 10 mg by mouth as needed.     sacubitril-valsartan (ENTRESTO) 49-51 MG Take 1 tablet by mouth 2 (two) times daily. 60 tablet 11   Zinc-Vitamin C (ZINC-A-COLD/VITAMIN C MT) Use as directed 1 capsule in the mouth or throat daily.     No current facility-administered medications for this visit.    PHYSICAL EXAMINATION: ECOG PERFORMANCE STATUS: 1 - Symptomatic but completely ambulatory  Vitals:   02/17/24 0945  BP: (!) 140/88  Pulse: 81  Resp: 17  Temp: 98.7 F (37.1 C)  SpO2: 100%   Filed Weights   02/17/24 0945  Weight: 174 lb 6.4 oz (79.1 kg)      LABORATORY DATA:  I have reviewed the data as listed    Latest Ref Rng & Units 01/15/2024    9:26 AM 12/04/2023    9:28 AM 11/14/2023    9:10 AM  CMP  Glucose 70 - 99 mg/dL 161  99  096   BUN 6 - 20 mg/dL 18  14  21    Creatinine 0.44 - 1.00 mg/dL 0.45  4.09  8.11   Sodium 135 - 145 mmol/L 139  138  137   Potassium 3.5 - 5.1 mmol/L 3.8  3.9  4.1   Chloride 98 - 111 mmol/L 107  105  106   CO2 22 - 32 mmol/L 26  27  24    Calcium 8.9 - 10.3 mg/dL 8.5  9.0  9.0   Total Protein 6.5 - 8.1 g/dL 6.4  6.9  6.8    Total Bilirubin 0.0 - 1.2 mg/dL 0.2  0.5  0.4   Alkaline Phos 38 - 126 U/L 90  95  106   AST 15 - 41 U/L 11  10  17    ALT 0 - 44 U/L 13  12  26      Lab Results  Component Value Date   WBC 6.5 01/15/2024   HGB 12.4 01/15/2024   HCT 38.3 01/15/2024   MCV 90.8 01/15/2024   PLT 258 01/15/2024   NEUTROABS 4.0 01/15/2024    ASSESSMENT & PLAN:  Breast cancer metastasized to liver (HCC) 02/11/19: RT lumpectomy: DICS Intermediate grade, Margins Neg, ER 90%, PR 70%; Lt Lumpectomy: UDH status post radiation, could not tolerate tamoxifen recurrence: 08/15/2021:MRI surveillance detected Right breast calcifications 5 cm, Ant biopsy: IG DCIS with necrosis, ER 40%, PR 0%, Posterior biopsy: Intermediate grade  DCIS with [facility]  11/29/2021: Right mastectomy: High-grade DCIS with microinvasion, ER 0%, PR 0%, HER2 positive, 0/1 lymph node negative  T1c MIC, N0: Stage Ia   Patient had second opinion at [facility] with Dr. Creig Hines who agreed with her plan of no systemic chemotherapy   Breast cancer surveillance: 1.  Mammogram 08/22/2022: Left breast benign breast density category B  2.  CT CAP 12/09/2022: Multiple liver lesions indicative of cysts and hemangiomas new right hepatic lobe lesion favored to be hemangioma otherwise no evidence of metastatic disease.  Radiology recommended an MRI of the liver. 3.  Liver MRI 01/08/2023: 2 New rim-enhancing lesions 2.3 cm (was 1.6 cm on 12/09/2022), 1.8 cm (measured 1.5 cm), benign hemangioma 2.4 cm and 1.9 cm and 1.3 cm unchanged 4.  PET CT scan 01/22/2023: Liver: 2.5 cm and 2.4 cm lesions ------------------------------------------------------------------------------------------------------------------------------------------------- 01/31/2023: Liver lesion biopsy: Metastatic adenocarcinoma to the liver consistent with breast primary positive for CK7 and GATA3, ER 0%, PR 0%, Ki-67 25%, HER2 3+ positive, [facility] molecular testing is pending   Current treatment: Completed 6 cycles  of Taxotere Herceptin Perjeta on 05/30/2023, currently on maintenance Herceptin and Perjeta (now being given intravenously because of skin irritation from injections)  Herceptin Perjeta toxicities:  Cardiomyopathy: Fatigue and palpitations on Entresto and metoprolol.  Follows cardiology   She saw [facility]. Roland [facility] at [facility] for second opinion  Meningioma: Follows with Dr. Barbaraann Cao, brain MRI  08/04/2023: Stable, new brain MRI has been ordered   PET/CT: 06/19/2023: Interval resolution of the hypermetabolic lesions in the liver. PET/CT 10/03/2023: No evidence of recurrent/metastatic cancer low-density liver lesions less conspicuous without any hypermetabolic activity 01/08/2024: CT CAP: Stable liver lesions 2.2 cm, 2 cm.  No new lesions.  Stable bilateral pulmonary nodules (considered benign) 02/11/2024: PET/CT scan: No definite evidence of hypermetabolic metastatic disease.  Equivocal small focus of metabolic activity dome of right hepatic lobe corresponding to the MRI finding.  Remains indeterminate (suggest liver MRI in 3 to 6 months), stable lung nodules  Return to clinic every 3 weeks for Herceptin and Perjeta ------------------------------------- Assessment and Plan Assessment & Plan Carcinoma of breast metastatic to liver Recent imaging shows no hypermetabolic cancer or recurrence. Indeterminate liver focus likely benign. Small lung nodules stable and benign. Negative test supports benign findings. Discussed long-term stability potential with Herceptin. - Order MRI follow-up in 3 to 6 months. - Prescribe Difocam as needed for discomfort, 10 tablets. - Contact Dr. Archer Asa for further discussion.      Orders Placed This Encounter  Procedures   MR Abdomen W Wo Contrast    Standing Status:   Future    Expected Date:   05/19/2024    Expiration Date:   02/16/2025    If indicated for the ordered procedure, I authorize the administration of contrast media per Radiology protocol:   Yes    What  is the patient's sedation requirement?:   No Sedation    Does the patient have a pacemaker or implanted devices?:   No    Preferred imaging location?:   [facility] (table limit-550lbs)    Release to patient:   Immediate   The patient has a good understanding of the overall plan. she agrees with it. she will call with any problems that may develop before the next visit here. Total time spent: 30 mins including face to face time and time spent for planning, charting and co-ordination of care   Tamsen Meek, MD 02/17/24

## 2024-02-17 NOTE — Assessment & Plan Note (Signed)
 02/11/19: RT lumpectomy: DICS Intermediate grade, Margins Neg, ER 90%, PR 70%; Lt Lumpectomy: UDH status post radiation, could not tolerate tamoxifen recurrence: 08/15/2021:MRI surveillance detected Right breast calcifications 5 cm, Ant biopsy: IG DCIS with necrosis, ER 40%, PR 0%, Posterior biopsy: Intermediate grade DCIS with [facility]  11/29/2021: Right mastectomy: High-grade DCIS with microinvasion, ER 0%, PR 0%, HER2 positive, 0/1 lymph node negative  T1c MIC, N0: Stage Ia   Patient had second opinion at [facility] with Dr. Creig Hines who agreed with her plan of no systemic chemotherapy   Breast cancer surveillance: 1.  Mammogram 08/22/2022: Left breast benign breast density category B  2.  CT CAP 12/09/2022: Multiple liver lesions indicative of cysts and hemangiomas new right hepatic lobe lesion favored to be hemangioma otherwise no evidence of metastatic disease.  Radiology recommended an MRI of the liver. 3.  Liver MRI 01/08/2023: 2 New rim-enhancing lesions 2.3 cm (was 1.6 cm on 12/09/2022), 1.8 cm (measured 1.5 cm), benign hemangioma 2.4 cm and 1.9 cm and 1.3 cm unchanged 4.  PET CT scan 01/22/2023: Liver: 2.5 cm and 2.4 cm lesions ------------------------------------------------------------------------------------------------------------------------------------------------- 01/31/2023: Liver lesion biopsy: Metastatic adenocarcinoma to the liver consistent with breast primary positive for CK7 and GATA3, ER 0%, PR 0%, Ki-67 25%, HER2 3+ positive, [facility] molecular testing is pending   Current treatment: Completed 6 cycles of Taxotere Herceptin Perjeta on 05/30/2023, currently on maintenance Herceptin and Perjeta (now being given intravenously because of skin irritation from injections)  Herceptin Perjeta toxicities:  Cardiomyopathy: Fatigue and palpitations on Entresto and metoprolol.  Follows cardiology   [facility]  Meningioma: Follows with Dr. Barbaraann Cao, brain MRI   08/04/2023: Stable, new brain MRI has been ordered   PET/CT: 06/19/2023: Interval resolution of the hypermetabolic lesions in the liver. PET/CT 10/03/2023: No evidence of recurrent/metastatic cancer low-density liver lesions less conspicuous without any hypermetabolic activity 01/08/2024: CT CAP: Stable liver lesions 2.2 cm, 2 cm.  No new lesions.  Stable bilateral pulmonary nodules (considered benign) 02/11/2024: PET/CT scan:   Return to clinic every 3 weeks for Herceptin and Perjeta

## 2024-02-24 DIAGNOSIS — Z6831 Body mass index (BMI) 31.0-31.9, adult: Secondary | ICD-10-CM | POA: Diagnosis not present

## 2024-02-24 DIAGNOSIS — Z124 Encounter for screening for malignant neoplasm of cervix: Secondary | ICD-10-CM | POA: Diagnosis not present

## 2024-02-24 DIAGNOSIS — Z01419 Encounter for gynecological examination (general) (routine) without abnormal findings: Secondary | ICD-10-CM | POA: Diagnosis not present

## 2024-02-24 DIAGNOSIS — N760 Acute vaginitis: Secondary | ICD-10-CM | POA: Diagnosis not present

## 2024-02-26 ENCOUNTER — Encounter: Payer: Self-pay | Admitting: Adult Health

## 2024-02-26 ENCOUNTER — Inpatient Hospital Stay: Payer: BC Managed Care – PPO | Attending: Hematology and Oncology | Admitting: Adult Health

## 2024-02-26 ENCOUNTER — Inpatient Hospital Stay: Payer: BC Managed Care – PPO

## 2024-02-26 VITALS — BP 119/82 | HR 78 | Temp 98.2°F | Resp 16 | Ht 65.0 in | Wt 176.3 lb

## 2024-02-26 DIAGNOSIS — Z923 Personal history of irradiation: Secondary | ICD-10-CM | POA: Insufficient documentation

## 2024-02-26 DIAGNOSIS — Z79899 Other long term (current) drug therapy: Secondary | ICD-10-CM | POA: Insufficient documentation

## 2024-02-26 DIAGNOSIS — Z170 Estrogen receptor positive status [ER+]: Secondary | ICD-10-CM | POA: Diagnosis not present

## 2024-02-26 DIAGNOSIS — C50919 Malignant neoplasm of unspecified site of unspecified female breast: Secondary | ICD-10-CM

## 2024-02-26 DIAGNOSIS — C787 Secondary malignant neoplasm of liver and intrahepatic bile duct: Secondary | ICD-10-CM

## 2024-02-26 DIAGNOSIS — D0511 Intraductal carcinoma in situ of right breast: Secondary | ICD-10-CM | POA: Insufficient documentation

## 2024-02-26 DIAGNOSIS — Z5112 Encounter for antineoplastic immunotherapy: Secondary | ICD-10-CM | POA: Insufficient documentation

## 2024-02-26 MED ORDER — SODIUM CHLORIDE 0.9 % IV SOLN
INTRAVENOUS | Status: DC
Start: 2024-02-26 — End: 2024-02-26

## 2024-02-26 MED ORDER — HEPARIN SOD (PORK) LOCK FLUSH 100 UNIT/ML IV SOLN
500.0000 [IU] | Freq: Once | INTRAVENOUS | Status: AC | PRN
  Administered 2024-02-26: 500 [IU]

## 2024-02-26 MED ORDER — DIPHENHYDRAMINE HCL 25 MG PO CAPS
50.0000 mg | ORAL_CAPSULE | Freq: Once | ORAL | Status: AC
  Administered 2024-02-26: 50 mg via ORAL
  Filled 2024-02-26: qty 2

## 2024-02-26 MED ORDER — SODIUM CHLORIDE 0.9% FLUSH
10.0000 mL | INTRAVENOUS | Status: DC | PRN
  Administered 2024-02-26: 10 mL

## 2024-02-26 MED ORDER — TRASTUZUMAB-DKST CHEMO 150 MG IV SOLR
6.0000 mg/kg | Freq: Once | INTRAVENOUS | Status: AC
  Administered 2024-02-26: 483 mg via INTRAVENOUS
  Filled 2024-02-26: qty 23

## 2024-02-26 MED ORDER — SODIUM CHLORIDE 0.9 % IV SOLN
420.0000 mg | Freq: Once | INTRAVENOUS | Status: AC
  Administered 2024-02-26: 420 mg via INTRAVENOUS
  Filled 2024-02-26: qty 14

## 2024-02-26 MED ORDER — ACETAMINOPHEN 325 MG PO TABS
650.0000 mg | ORAL_TABLET | Freq: Once | ORAL | Status: AC
  Administered 2024-02-26: 650 mg via ORAL
  Filled 2024-02-26: qty 2

## 2024-02-26 NOTE — Progress Notes (Signed)
 [facility] Cancer Follow up:    Joann Bussing, MD [address] [place] [address]   DIAGNOSIS:  Cancer Staging  Breast cancer metastasized to liver [facility]) Staging form: Breast, AJCC 8th Edition - Clinical: Stage IV (cT9mi, cNX, cM1, G3, ER: Unknown, PR: Unknown, HER2: Unknown) - Signed by Serena Croissant, MD on 02/06/2023 Histologic grading system: 3 grade system   SUMMARY OF ONCOLOGIC HISTORY: Oncology History  Breast cancer metastasized to liver (HCC)  01/25/2019 Initial Diagnosis   Screening mammogram showed bilateral calcifications right breast 5 mm, pleomorphic.  Left breast 1.6 cm punctate, smaller group of calcifications spanning 3 mm.  Right lower outer quadrant biopsy revealed low-grade DCIS ER 90%, PR 70%; biopsy of left breast calcifications ALH and PASH   01/31/2019 Genetic Testing   ATM c.8495G>A and RECQL4 c.2587G>A VUS identified on the 9-gene STAT panel and Multi-cancer panel through [facility].  The STAT Breast cancer panel offered by [facility] includes sequencing and rearrangement analysis for the following 9 genes:  ATM, BRCA1, BRCA2, CDH1, CHEK2, PALB2, PTEN, STK11 and TP53.   The Multi-Gene Panel offered by [facility] includes sequencing and/or deletion duplication testing of the following 84 genes: AIP, ALK, APC, ATM, AXIN2,BAP1,  BARD1, BLM, BMPR1A, BRCA1, BRCA2, BRIP1, CASR, CDC73, CDH1, CDK4, CDKN1B, CDKN1C, CDKN2A (p14ARF), CDKN2A (p16INK4a), CEBPA, CHEK2, CTNNA1, DICER1, DIS3L2, EGFR (c.2369C>T, p.Thr790Met variant only), EPCAM (Deletion/duplication testing only), FH, FLCN, GATA2, GPC3, GREM1 (Promoter region deletion/duplication testing only), HOXB13 (c.251G>A, p.Gly84Glu), HRAS, KIT, MAX, MEN1, MET, MITF (c.952G>A, p.Glu318Lys variant only), MLH1, MSH2, MSH3, MSH6, MUTYH, NBN, NF1, NF2, NTHL1, PALB2, PDGFRA, PHOX2B, PMS2, POLD1, POLE, POT1, PRKAR1A, PTCH1, PTEN, RAD50, RAD51C, RAD51D, RB1, RECQL4, RET, RUNX1, SDHAF2, SDHA (sequence changes  only), SDHB, SDHC, SDHD, SMAD4, SMARCA4, SMARCB1, SMARCE1, STK11, SUFU, TERC, TERT, TMEM127, TP53, TSC1, TSC2, VHL, WRN and WT1.  he report date is February 02, 2019.   02/11/2019 Surgery   RT lumpectomy: DICS Intermediate grade, Margins Neg, ER 90%, PR 70%; Lt Lumpectomy: UDH   02/26/2019 -  Anti-estrogen oral therapy   Tamoxifen 20mg  daily, stopped because of depression and facial numbness   05/20/2019 - 06/17/2019 Radiation Therapy   Adjuvant XRT   08/15/2021 Relapse/Recurrence   MRI surveillance detected Right breast calcifications 5 cm, Ant biopsy: IG DCIS with necrosis, ER 40%, PR 0%, Posterior biopsy: Intermediate grade DCIS with [facility]   08/19/2022 Cancer Staging   Staging form: Breast, AJCC 8th Edition - Clinical: Stage IV (cT79mi, cNX, cM1, G3, ER: Unknown, PR: Unknown, HER2: Unknown) - Signed by Serena Croissant, MD on 02/06/2023 Histologic grading system: 3 grade system   02/14/2023 - 01/15/2024 Chemotherapy   Patient is on Treatment Plan : BREAST DOCEtaxel + Trastuzumab + Pertuzumab (THP) q21d x 8 cycles / Trastuzumab + Pertuzumab q21d x 4 cycles     02/05/2024 -  Chemotherapy   Patient is on Treatment Plan : BREAST Trastuzumab  + Pertuzumab q21d x 13 cycles     AML (acute myeloid leukemia) (HCC)  09/1998 Initial Diagnosis   AML (acute myeloid leukemia) (HCC) treated with 2 induction regimens followed by 6 consolidation regimens, remission     CURRENT THERAPY: Herceptin Perjeta  INTERVAL HISTORY:  Discussed the use of AI scribe software for clinical note transcription with the patient, who gave verbal consent to proceed.  Joann Meadows 48 y.o. female with a history of metastatic breast cancer, presents for a follow-up visit prior to receiving treatment. She reports no new issues and her appetite has  been good. She is trying to increase her activity level but is hindered by the current pollen levels. She has a stationary bike at home but prefers walking outside.  She is currently  taking antibiotics for a vaginal infection and has been seeing a gynecologist for this issue. She also mentions that her problems started when her menstrual period stopped.  Her most recent echo occurred on 01/30/2024 demonstrated an EF of 55-60%.    Patient Active Problem List   Diagnosis Date Noted   Other specified disorders of eustachian tube, bilateral 09/05/2023   Chronic rhinitis 09/05/2023   Swimmer's ear of right side 09/03/2023   Port-A-Cath in place 03/07/2023   Meningioma (HCC) 01/30/2023   Liver lesion 01/23/2023   COPD possible, would be Gold Group B 11/25/2019   DOE (dyspnea on exertion) 11/24/2019   Encounter for antineoplastic chemotherapy 02/02/2019   Breast cancer metastasized to liver (HCC) 01/25/2019   AML (acute myeloid leukemia) (HCC) 01/25/2019   Family history of colon cancer    Family history of leukemia    Delivery normal 03/25/2017    is allergic to levaquin [levofloxacin], tamoxifen citrate, and tape.  MEDICAL HISTORY: Past Medical History:  Diagnosis Date   Breast cancer (HCC) 2020   Right Breast Cancer   Endometriosis    Fibroid    Hx of radiation therapy 06/2019   Leukemia (HCC)    20 years ago   Personal history of radiation therapy 2020   Right Breast Cancer   PONV (postoperative nausea and vomiting)    pt states she vomits after colonoscopies    SURGICAL HISTORY: Past Surgical History:  Procedure Laterality Date   BREAST EXCISIONAL BIOPSY Left 02/11/2019   [facility]   BREAST LUMPECTOMY Right 02/11/2019   BREAST LUMPECTOMY WITH RADIOACTIVE SEED LOCALIZATION Bilateral 02/11/2019   Procedure: BILATERAL BREAST LUMPECTOMIES WITH BILATERAL RADIOACTIVE SEED LOCALIZATION;  Surgeon: Manus Rudd, MD;  Location: [facility];  Service: General;  Laterality: Bilateral;   IR IMAGING GUIDED PORT INSERTION  02/25/2023   IR RADIOLOGIST EVAL & MGMT  02/27/2023   IR RADIOLOGIST EVAL & MGMT  01/22/2024   IR RADIOLOGIST EVAL & MGMT  01/29/2024    MASTECTOMY Right 11/2021    SOCIAL HISTORY: Social History   Socioeconomic History   Marital status: Single    Spouse name: Not on file   Number of children: 2   Years of education: Not on file   Highest education level: High school graduate  Occupational History   Not on file  Tobacco Use   Smoking status: Former    Current packs/day: 0.00    Types: Cigarettes    Start date: 01/24/2005    Quit date: 01/25/2015    Years since quitting: 9.0   Smokeless tobacco: Never  Vaping Use   Vaping status: Never Used  Substance and Sexual Activity   Alcohol use: No   Drug use: No   Sexual activity: Yes    Birth control/protection: None  Other Topics Concern   Not on file  Social History Narrative   Lives with children   Social Drivers of Health   Financial Resource Strain: Not on file  Food Insecurity: No Food Insecurity (10/03/2022)   Hunger Vital Sign    Worried About Running Out of Food in the Last Year: Never true    Ran Out of Food in the Last Year: Never true  Transportation Needs: No Transportation Needs (10/03/2022)   PRAPARE - Transportation    Lack  of Transportation (Medical): No    Lack of Transportation (Non-Medical): No  Physical Activity: Not on file  Stress: Not on file  Social Connections: Not on file  Intimate Partner Violence: Not At Risk (02/26/2019)   Humiliation, Afraid, Rape, and Kick questionnaire    Fear of Current or Ex-Partner: No    Emotionally Abused: No    Physically Abused: No    Sexually Abused: No    FAMILY HISTORY: Family History  Problem Relation Age of Onset   Colon cancer Mother 1       d. 5   Hypertension Father    Diabetes Maternal Aunt    Diabetes Maternal Uncle    Mental retardation Maternal Uncle    Diabetes Paternal Aunt    Diabetes Maternal Grandmother    Stroke Paternal Grandmother        c. 40   Leukemia Paternal Grandfather        d. 4; ?CLL   Leukemia Cousin 4       mat first cousin   Lymphoma Cousin 40       pat  first cousin    Review of Systems  Constitutional:  Negative for appetite change, chills, fatigue, fever and unexpected weight change.  HENT:   Negative for hearing loss, lump/mass and trouble swallowing.   Eyes:  Negative for eye problems and icterus.  Respiratory:  Negative for chest tightness, cough and shortness of breath.   Cardiovascular:  Negative for chest pain, leg swelling and palpitations.  Gastrointestinal:  Negative for abdominal distention, abdominal pain, constipation, diarrhea, nausea and vomiting.  Endocrine: Negative for hot flashes.  Genitourinary:  Negative for difficulty urinating.   Musculoskeletal:  Negative for arthralgias.  Skin:  Negative for itching and rash.  Neurological:  Negative for dizziness, extremity weakness, headaches and numbness.  Hematological:  Negative for adenopathy. Does not bruise/bleed easily.  Psychiatric/Behavioral:  Negative for depression. The patient is not nervous/anxious.       PHYSICAL EXAMINATION    Vitals:   02/26/24 1047  BP: 119/82  Pulse: 78  Resp: 16  Temp: 98.2 F (36.8 C)  SpO2: 100%    Physical Exam Constitutional:      General: She is not in acute distress.    Appearance: Normal appearance. She is not toxic-appearing.  HENT:     Head: Normocephalic and atraumatic.     Mouth/Throat:     Mouth: Mucous membranes are moist.     Pharynx: Oropharynx is clear. No oropharyngeal exudate or posterior oropharyngeal erythema.  Eyes:     General: No scleral icterus. Cardiovascular:     Rate and Rhythm: Normal rate and regular rhythm.     Pulses: Normal pulses.     Heart sounds: Normal heart sounds.  Pulmonary:     Effort: Pulmonary effort is normal.     Breath sounds: Normal breath sounds.  Abdominal:     General: Abdomen is flat. Bowel sounds are normal. There is no distension.     Palpations: Abdomen is soft.     Tenderness: There is no abdominal tenderness.  Musculoskeletal:        General: No swelling.      Cervical back: Neck supple.  Lymphadenopathy:     Cervical: No cervical adenopathy.  Skin:    General: Skin is warm and dry.     Findings: No rash.  Neurological:     General: No focal deficit present.     Mental Status: She is alert.  Psychiatric:  Mood and Affect: Mood normal.        Behavior: Behavior normal.     LABORATORY DATA:  CBC    Component Value Date/Time   WBC 6.5 01/15/2024 0926   WBC 2.7 (L) 02/26/2023 0001   RBC 4.22 01/15/2024 0926   HGB 12.4 01/15/2024 0926   HCT 38.3 01/15/2024 0926   PLT 258 01/15/2024 0926   MCV 90.8 01/15/2024 0926   MCH 29.4 01/15/2024 0926   MCHC 32.4 01/15/2024 0926   RDW 13.2 01/15/2024 0926   LYMPHSABS 1.8 01/15/2024 0926   MONOABS 0.4 01/15/2024 0926   EOSABS 0.3 01/15/2024 0926   BASOSABS 0.1 01/15/2024 0926    CMP     Component Value Date/Time   NA 139 01/15/2024 0926   K 3.8 01/15/2024 0926   CL 107 01/15/2024 0926   CO2 26 01/15/2024 0926   GLUCOSE 102 (H) 01/15/2024 0926   BUN 18 01/15/2024 0926   CREATININE 0.72 01/15/2024 0926   CALCIUM 8.5 (L) 01/15/2024 0926   PROT 6.4 (L) 01/15/2024 0926   ALBUMIN 4.0 01/15/2024 0926   AST 11 (L) 01/15/2024 0926   ALT 13 01/15/2024 0926   ALKPHOS 90 01/15/2024 0926   BILITOT 0.2 01/15/2024 0926   GFRNONAA >60 01/15/2024 0926   GFRAA >60 10/27/2019 1528       PENDING LABS:   RADIOGRAPHIC STUDIES:  No results found.   PATHOLOGY:     ASSESSMENT and THERAPY PLAN:   Breast cancer metastasized to liver (HCC) 02/11/19: RT lumpectomy: DICS Intermediate grade, Margins Neg, ER 90%, PR 70%; Lt Lumpectomy: UDH status post radiation, could not tolerate tamoxifen recurrence: 08/15/2021:MRI surveillance detected Right breast calcifications 5 cm, Ant biopsy: IG DCIS with necrosis, ER 40%, PR 0%, Posterior biopsy: Intermediate grade DCIS with [facility]  11/29/2021: Right mastectomy: High-grade DCIS with microinvasion, ER 0%, PR 0%, HER2 positive, 0/1 lymph node negative   T1c MIC, N0: Stage Ia   Patient had second opinion at [facility] with Dr. Creig Hines who agreed with her plan of no systemic chemotherapy   Breast cancer surveillance: 1.  Mammogram 08/22/2022: Left breast benign breast density category B  2.  CT CAP 12/09/2022: Multiple liver lesions indicative of cysts and hemangiomas new right hepatic lobe lesion favored to be hemangioma otherwise no evidence of metastatic disease.  Radiology recommended an MRI of the liver. 3.  Liver MRI 01/08/2023: 2 New rim-enhancing lesions 2.3 cm (was 1.6 cm on 12/09/2022), 1.8 cm (measured 1.5 cm), benign hemangioma 2.4 cm and 1.9 cm and 1.3 cm unchanged 4.  PET CT scan 01/22/2023: Liver: 2.5 cm and 2.4 cm lesions ------------------------------------------------------------------------------------------------------------------------------------------------- 01/31/2023: Liver lesion biopsy: Metastatic adenocarcinoma to the liver consistent with breast primary positive for CK7 and GATA3, ER 0%, PR 0%, Ki-67 25%, HER2 3+ positive, [facility]is molecular testing is pending   Current treatment: Completed 6 cycles of Taxotere Herceptin Perjeta on 05/30/2023, currently on maintenance Herceptin and Perjeta (now being given intravenously because of skin irritation from injections)  Herceptin Perjeta toxicities:  Cardiomyopathy: Fatigue and palpitations on Entresto and metoprolol.  Follows cardiology, most recent echo normal on 01/30/2024. She saw Dr. Roland Earl at [facility] for second opinion  Meningioma: Follows with Dr. Barbaraann Cao, brain MRI  08/04/2023: Stable   PET/CT: 06/19/2023: Interval resolution of the hypermetabolic lesions in the liver. PET/CT 10/03/2023: No evidence of recurrent/metastatic cancer low-density liver lesions less conspicuous without any hypermetabolic activity 01/08/2024: CT CAP: Stable liver lesions 2.2 cm, 2 cm.  No new lesions.  Stable bilateral pulmonary nodules (considered benign) 02/11/2024: PET/CT scan: No definitive evidence  of hypermetabolic metastatic disease or local recurrence of breast cancer. Equivocal small focus of hypermetabolic activity in the dome of the right hepatic lobe, possibly corresponding with the enlarging enhancing focus on recent MRI. This remains indeterminate, and is suboptimally evaluated due to small size. Recommend attention on follow-up MRI in 3-6 months.  She is tolerating treatment well and will continue it. -Continue f/u with Dr. Gala Romney  -Encourage increased physical activity as tolerated -Liver MRI in 3 months  Recurrent Vaginal Infections -Consider acidophilous supplement -Continue Metronidazole prescribed by OBGYN  Return to clinic every 3 weeks for Herceptin and Perjeta   All questions were answered. The patient knows to call the clinic with any problems, questions or concerns. We can certainly see the patient much sooner if necessary.  Total encounter time:20 minutes*in face-to-face visit time, chart review, lab review, care coordination, order entry, and documentation of the encounter time.  Lillard Anes, NP 02/26/24 1:12 PM Medical Oncology and Hematology [facility] [address] [place], [place] [address] Tel. [phone]    Fax. [phone]  *Total Encounter Time as defined by the Centers for Medicare and Medicaid Services includes, in addition to the face-to-face time of a patient visit (documented in the note above) non-face-to-face time: obtaining and reviewing outside history, ordering and reviewing medications, tests or procedures, care coordination (communications with other health care professionals or caregivers) and documentation in the medical record.

## 2024-02-26 NOTE — Patient Instructions (Signed)
 [facility] - A DEPT OF [facility]  Discharge Instructions: Thank you for choosing [facility] to provide your oncology and hematology care.   If you have a lab appointment with the Cancer Center, please go directly to the Cancer Center and check in at the registration area.   Wear comfortable clothing and clothing appropriate for easy access to any Portacath or PICC line.   We strive to give you quality time with your provider. You may need to reschedule your appointment if you arrive late (15 or more minutes).  Arriving late affects you and other patients whose appointments are after yours.  Also, if you miss three or more appointments without notifying the office, you may be dismissed from the clinic at the provider's discretion.      For prescription refill requests, have your pharmacy contact our office and allow 72 hours for refills to be completed.    Today you received the following chemotherapy and/or immunotherapy agents: Trastuzumab and Pertuzumab      To help prevent nausea and vomiting after your treatment, we encourage you to take your nausea medication as directed.  BELOW ARE SYMPTOMS THAT SHOULD BE REPORTED IMMEDIATELY: *FEVER GREATER THAN 100.4 F (38 C) OR HIGHER *CHILLS OR SWEATING *NAUSEA AND VOMITING THAT IS NOT CONTROLLED WITH YOUR NAUSEA MEDICATION *UNUSUAL SHORTNESS OF BREATH *UNUSUAL BRUISING OR BLEEDING *URINARY PROBLEMS (pain or burning when urinating, or frequent urination) *BOWEL PROBLEMS (unusual diarrhea, constipation, pain near the anus) TENDERNESS IN MOUTH AND THROAT WITH OR WITHOUT PRESENCE OF ULCERS (sore throat, sores in mouth, or a toothache) UNUSUAL RASH, SWELLING OR PAIN  UNUSUAL VAGINAL DISCHARGE OR ITCHING   Items with * indicate a potential emergency and should be followed up as soon as possible or go to the Emergency Department if any problems should occur.  Please show the CHEMOTHERAPY ALERT CARD  or IMMUNOTHERAPY ALERT CARD at check-in to the Emergency Department and triage nurse.  Should you have questions after your visit or need to cancel or reschedule your appointment, please contact [facility] - A DEPT OF [facility]  Dept: [phone]  and follow the prompts.  Office hours are 8:00 a.m. to 4:30 p.m. Monday - Friday. Please note that voicemails left after 4:00 p.m. may not be returned until the following business day.  We are closed weekends and major holidays. You have access to a nurse at all times for urgent questions. Please call the main number to the clinic Dept: [phone] and follow the prompts.   For any non-urgent questions, you may also contact your provider using MyChart. We now offer e-Visits for anyone 44 and older to request care online for non-urgent symptoms. For details visit [email or web address].   Also download the MyChart app! Go to the app store, search "MyChart", open the app, select [place], and log in with your MyChart username and password.

## 2024-02-26 NOTE — Assessment & Plan Note (Signed)
 02/11/19: RT lumpectomy: DICS Intermediate grade, Margins Neg, ER 90%, PR 70%; Lt Lumpectomy: UDH status post radiation, could not tolerate tamoxifen recurrence: 08/15/2021:MRI surveillance detected Right breast calcifications 5 cm, Ant biopsy: IG DCIS with necrosis, ER 40%, PR 0%, Posterior biopsy: Intermediate grade DCIS with [facility]  11/29/2021: Right mastectomy: High-grade DCIS with microinvasion, ER 0%, PR 0%, HER2 positive, 0/1 lymph node negative  T1c MIC, N0: Stage Ia   Patient had second opinion at [facility] with Dr. Creig Hines who agreed with her plan of no systemic chemotherapy   Breast cancer surveillance: 1.  Mammogram 08/22/2022: Left breast benign breast density category B  2.  CT CAP 12/09/2022: Multiple liver lesions indicative of cysts and hemangiomas new right hepatic lobe lesion favored to be hemangioma otherwise no evidence of metastatic disease.  Radiology recommended an MRI of the liver. 3.  Liver MRI 01/08/2023: 2 New rim-enhancing lesions 2.3 cm (was 1.6 cm on 12/09/2022), 1.8 cm (measured 1.5 cm), benign hemangioma 2.4 cm and 1.9 cm and 1.3 cm unchanged 4.  PET CT scan 01/22/2023: Liver: 2.5 cm and 2.4 cm lesions ------------------------------------------------------------------------------------------------------------------------------------------------- 01/31/2023: Liver lesion biopsy: Metastatic adenocarcinoma to the liver consistent with breast primary positive for CK7 and GATA3, ER 0%, PR 0%, Ki-67 25%, HER2 3+ positive, [facility] molecular testing is pending   Current treatment: Completed 6 cycles of Taxotere Herceptin Perjeta on 05/30/2023, currently on maintenance Herceptin and Perjeta (now being given intravenously because of skin irritation from injections)  Herceptin Perjeta toxicities:  Cardiomyopathy: Fatigue and palpitations on Entresto and metoprolol.  Follows cardiology, most recent echo normal on 01/30/2024. She saw Dr. Roland Earl at [facility] for second opinion  Meningioma:  Follows with Dr. Barbaraann Cao, brain MRI  08/04/2023: Stable   PET/CT: 06/19/2023: Interval resolution of the hypermetabolic lesions in the liver. PET/CT 10/03/2023: No evidence of recurrent/metastatic cancer low-density liver lesions less conspicuous without any hypermetabolic activity 01/08/2024: CT CAP: Stable liver lesions 2.2 cm, 2 cm.  No new lesions.  Stable bilateral pulmonary nodules (considered benign) 02/11/2024: PET/CT scan: No definitive evidence of hypermetabolic metastatic disease or local recurrence of breast cancer. Equivocal small focus of hypermetabolic activity in the dome of the right hepatic lobe, possibly corresponding with the enlarging enhancing focus on recent MRI. This remains indeterminate, and is suboptimally evaluated due to small size. Recommend attention on follow-up MRI in 3-6 months.  She is tolerating treatment well and will continue it. -Continue f/u with Dr. Gala Romney  -Encourage increased physical activity as tolerated -Liver MRI in 3 months  Recurrent Vaginal Infections -Consider acidophilous supplement -Continue Metronidazole prescribed by OBGYN  Return to clinic every 3 weeks for Herceptin and Perjeta

## 2024-03-18 ENCOUNTER — Inpatient Hospital Stay

## 2024-03-18 ENCOUNTER — Inpatient Hospital Stay: Admitting: Hematology and Oncology

## 2024-03-18 VITALS — BP 114/78 | HR 77 | Temp 98.6°F | Resp 18 | Ht 65.0 in | Wt 175.8 lb

## 2024-03-18 VITALS — BP 148/91 | HR 66 | Temp 98.2°F | Resp 16

## 2024-03-18 DIAGNOSIS — C50919 Malignant neoplasm of unspecified site of unspecified female breast: Secondary | ICD-10-CM | POA: Diagnosis not present

## 2024-03-18 DIAGNOSIS — Z170 Estrogen receptor positive status [ER+]: Secondary | ICD-10-CM | POA: Diagnosis not present

## 2024-03-18 DIAGNOSIS — Z923 Personal history of irradiation: Secondary | ICD-10-CM | POA: Diagnosis not present

## 2024-03-18 DIAGNOSIS — Z5112 Encounter for antineoplastic immunotherapy: Secondary | ICD-10-CM | POA: Diagnosis not present

## 2024-03-18 DIAGNOSIS — Z79899 Other long term (current) drug therapy: Secondary | ICD-10-CM | POA: Diagnosis not present

## 2024-03-18 DIAGNOSIS — D0511 Intraductal carcinoma in situ of right breast: Secondary | ICD-10-CM | POA: Diagnosis not present

## 2024-03-18 DIAGNOSIS — C787 Secondary malignant neoplasm of liver and intrahepatic bile duct: Secondary | ICD-10-CM

## 2024-03-18 LAB — CMP (CANCER CENTER ONLY)
ALT: 14 U/L (ref 0–44)
AST: 12 U/L — ABNORMAL LOW (ref 15–41)
Albumin: 4.3 g/dL (ref 3.5–5.0)
Alkaline Phosphatase: 78 U/L (ref 38–126)
Anion gap: 6 (ref 5–15)
BUN: 15 mg/dL (ref 6–20)
CO2: 26 mmol/L (ref 22–32)
Calcium: 8.9 mg/dL (ref 8.9–10.3)
Chloride: 107 mmol/L (ref 98–111)
Creatinine: 0.63 mg/dL (ref 0.44–1.00)
GFR, Estimated: >60 mL/min (ref >60)
Glucose, Bld: 124 mg/dL — ABNORMAL HIGH (ref 70–99)
Potassium: 3.7 mmol/L (ref 3.5–5.1)
Sodium: 139 mmol/L (ref 135–145)
Total Bilirubin: 0.3 mg/dL (ref 0.0–1.2)
Total Protein: 6.9 g/dL (ref 6.5–8.1)

## 2024-03-18 LAB — CBC WITH DIFFERENTIAL (CANCER CENTER ONLY)
Abs Immature Granulocytes: 0 10*3/uL (ref 0.00–0.07)
Basophils Absolute: 0 10*3/uL (ref 0.0–0.1)
Basophils Relative: 1 %
Eosinophils Absolute: 0.2 10*3/uL (ref 0.0–0.5)
Eosinophils Relative: 3 %
HCT: 38.6 % (ref 36.0–46.0)
Hemoglobin: 12.9 g/dL (ref 12.0–15.0)
Immature Granulocytes: 0 %
Lymphocytes Relative: 35 %
Lymphs Abs: 1.9 10*3/uL (ref 0.7–4.0)
MCH: 29.3 pg (ref 26.0–34.0)
MCHC: 33.4 g/dL (ref 30.0–36.0)
MCV: 87.5 fL (ref 80.0–100.0)
Monocytes Absolute: 0.4 10*3/uL (ref 0.1–1.0)
Monocytes Relative: 8 %
Neutro Abs: 2.8 10*3/uL (ref 1.7–7.7)
Neutrophils Relative %: 53 %
Platelet Count: 223 10*3/uL (ref 150–400)
RBC: 4.41 MIL/uL (ref 3.87–5.11)
RDW: 13.1 % (ref 11.5–15.5)
WBC Count: 5.3 10*3/uL (ref 4.0–10.5)
nRBC: 0 % (ref 0.0–0.2)

## 2024-03-18 MED ORDER — SODIUM CHLORIDE 0.9% FLUSH
10.0000 mL | INTRAVENOUS | Status: DC | PRN
  Administered 2024-03-18: 10 mL

## 2024-03-18 MED ORDER — ACETAMINOPHEN 325 MG PO TABS
650.0000 mg | ORAL_TABLET | Freq: Once | ORAL | Status: AC
  Administered 2024-03-18: 650 mg via ORAL
  Filled 2024-03-18: qty 2

## 2024-03-18 MED ORDER — SODIUM CHLORIDE 0.9 % IV SOLN: INTRAVENOUS | Status: DC

## 2024-03-18 MED ORDER — DIPHENHYDRAMINE HCL 25 MG PO CAPS
50.0000 mg | ORAL_CAPSULE | Freq: Once | ORAL | Status: AC
  Administered 2024-03-18: 50 mg via ORAL
  Filled 2024-03-18: qty 2

## 2024-03-18 MED ORDER — SODIUM CHLORIDE 0.9 % IV SOLN
6.0000 mg/kg | Freq: Once | INTRAVENOUS | Status: AC
  Administered 2024-03-18: 483 mg via INTRAVENOUS
  Filled 2024-03-18: qty 23

## 2024-03-18 MED ORDER — SODIUM CHLORIDE 0.9 % IV SOLN
420.0000 mg | Freq: Once | INTRAVENOUS | Status: AC
  Administered 2024-03-18: 420 mg via INTRAVENOUS
  Filled 2024-03-18: qty 14

## 2024-03-18 MED ORDER — HEPARIN SOD (PORK) LOCK FLUSH 100 UNIT/ML IV SOLN
500.0000 [IU] | Freq: Once | INTRAVENOUS | Status: AC | PRN
  Administered 2024-03-18: 500 [IU]

## 2024-03-18 NOTE — Patient Instructions (Signed)
 [facility] - A DEPT OF [facility]  Discharge Instructions: Thank you for choosing [facility] to provide your oncology and hematology care.   If you have a lab appointment with the Cancer Center, please go directly to the Cancer Center and check in at the registration area.   Wear comfortable clothing and clothing appropriate for easy access to any Portacath or PICC line.   We strive to give you quality time with your provider. You may need to reschedule your appointment if you arrive late (15 or more minutes).  Arriving late affects you and other patients whose appointments are after yours.  Also, if you miss three or more appointments without notifying the office, you may be dismissed from the clinic at the provider's discretion.      For prescription refill requests, have your pharmacy contact our office and allow 72 hours for refills to be completed.    Today you received the following chemotherapy and/or immunotherapy agents: Trastuzumab and Pertuzumab      To help prevent nausea and vomiting after your treatment, we encourage you to take your nausea medication as directed.  BELOW ARE SYMPTOMS THAT SHOULD BE REPORTED IMMEDIATELY: *FEVER GREATER THAN 100.4 F (38 C) OR HIGHER *CHILLS OR SWEATING *NAUSEA AND VOMITING THAT IS NOT CONTROLLED WITH YOUR NAUSEA MEDICATION *UNUSUAL SHORTNESS OF BREATH *UNUSUAL BRUISING OR BLEEDING *URINARY PROBLEMS (pain or burning when urinating, or frequent urination) *BOWEL PROBLEMS (unusual diarrhea, constipation, pain near the anus) TENDERNESS IN MOUTH AND THROAT WITH OR WITHOUT PRESENCE OF ULCERS (sore throat, sores in mouth, or a toothache) UNUSUAL RASH, SWELLING OR PAIN  UNUSUAL VAGINAL DISCHARGE OR ITCHING   Items with * indicate a potential emergency and should be followed up as soon as possible or go to the Emergency Department if any problems should occur.  Please show the CHEMOTHERAPY ALERT CARD  or IMMUNOTHERAPY ALERT CARD at check-in to the Emergency Department and triage nurse.  Should you have questions after your visit or need to cancel or reschedule your appointment, please contact [facility] - A DEPT OF [facility]  Dept: [phone]  and follow the prompts.  Office hours are 8:00 a.m. to 4:30 p.m. Monday - Friday. Please note that voicemails left after 4:00 p.m. may not be returned until the following business day.  We are closed weekends and major holidays. You have access to a nurse at all times for urgent questions. Please call the main number to the clinic Dept: [phone] and follow the prompts.   For any non-urgent questions, you may also contact your provider using MyChart. We now offer e-Visits for anyone 44 and older to request care online for non-urgent symptoms. For details visit [email or web address].   Also download the MyChart app! Go to the app store, search "MyChart", open the app, select [place], and log in with your MyChart username and password.

## 2024-03-18 NOTE — Assessment & Plan Note (Signed)
 02/11/19: RT lumpectomy: DICS Intermediate grade, Margins Neg, ER 90%, PR 70%; Lt Lumpectomy: UDH status post radiation, could not tolerate tamoxifen  recurrence: 08/15/2021:MRI surveillance detected Right breast calcifications 5 cm, Ant biopsy: IG DCIS with necrosis, ER 40%, PR 0%, Posterior biopsy: Intermediate grade DCIS with [facility]  11/29/2021: Right mastectomy: High-grade DCIS with microinvasion, ER 0%, PR 0%, HER2 positive, 0/1 lymph node negative  T1c MIC, N0: Stage Ia   Patient had second opinion at [facility] with Dr. Patra Bonnet who agreed with her plan of no systemic chemotherapy   Breast cancer surveillance: 1.  Mammogram 08/22/2022: Left breast benign breast density category B  2.  CT CAP 12/09/2022: Multiple liver lesions indicative of cysts and hemangiomas new right hepatic lobe lesion favored to be hemangioma otherwise no evidence of metastatic disease.  Radiology recommended an MRI of the liver. 3.  Liver MRI 01/08/2023: 2 New rim-enhancing lesions 2.3 cm (was 1.6 cm on 12/09/2022), 1.8 cm (measured 1.5 cm), benign hemangioma 2.4 cm and 1.9 cm and 1.3 cm unchanged 4.  PET CT scan 01/22/2023: Liver: 2.5 cm and 2.4 cm lesions ------------------------------------------------------------------------------------------------------------------------------------------------- 01/31/2023: Liver lesion biopsy: Metastatic adenocarcinoma to the liver consistent with breast primary positive for CK7 and GATA3, ER 0%, PR 0%, Ki-67 25%, HER2 3+ positive, [facility] molecular testing 02/21/2023: ER negative PR negative, HER2 amplified, ER positive TMB 4, BRCA negative, ESR 1 unmutated, PD-L1 negative, CPS: 1, PTEN IHC +2+ but mutation not detected   Current treatment: Completed 6 cycles of Taxotere  Herceptin  Perjeta  on 05/30/2023, currently on maintenance Herceptin  and Perjeta  (now being given intravenously because of skin irritation from injections)   Herceptin  Perjeta  toxicities:  Cardiomyopathy: Fatigue and palpitations on  Entresto  and metoprolol .  Follows cardiology   She saw Dr. Clarance Cristal at [facility] for second opinion  Meningioma: Follows with Dr. Mark Sil, brain MRI  08/04/2023: Stable, new brain MRI has been ordered   PET/CT: 06/19/2023: Interval resolution of the hypermetabolic lesions in the liver. PET/CT 10/03/2023: No evidence of recurrent/metastatic cancer low-density liver lesions less conspicuous without any hypermetabolic activity 01/08/2024: CT CAP: Stable liver lesions 2.2 cm, 2 cm.  No new lesions.  Stable bilateral pulmonary nodules (considered benign) 02/11/2024: PET/CT scan: No definite evidence of hypermetabolic metastatic disease.  Equivocal small focus of metabolic activity dome of right hepatic lobe corresponding to the MRI finding.  Remains indeterminate (suggest liver MRI in 3 to 6 months), stable lung nodules   Return to clinic every 3 weeks for Herceptin  and Perjeta 

## 2024-03-18 NOTE — Progress Notes (Signed)
 Patient Care Team: Lanae Pinal, MD as PCP - General (Family Medicine) Alane Hsu, RN as Oncology Nurse Navigator Auther Bo, RN as Oncology Nurse Navigator Cameron Cea, MD as Consulting Physician (Hematology and Oncology) Default, Provider, MD as Technician  DIAGNOSIS:  Encounter Diagnosis  Name Primary?   Carcinoma of breast metastatic to liver, unspecified laterality (HCC) Yes    SUMMARY OF ONCOLOGIC HISTORY: Oncology History  Breast cancer metastasized to liver (HCC)  01/25/2019 Initial Diagnosis   Screening mammogram showed bilateral calcifications right breast 5 mm, pleomorphic.  Left breast 1.6 cm punctate, smaller group of calcifications spanning 3 mm.  Right lower outer quadrant biopsy revealed low-grade DCIS ER 90%, PR 70%; biopsy of left breast calcifications ALH and PASH   01/31/2019 Genetic Testing   ATM c.8495G>A and RECQL4 c.2587G>A VUS identified on the 9-gene STAT panel and Multi-cancer panel through [facility].  The STAT Breast cancer panel offered by [facility] includes sequencing and rearrangement analysis for the following 9 genes:  ATM, BRCA1, BRCA2, CDH1, CHEK2, PALB2, PTEN, STK11 and TP53.   The Multi-Gene Panel offered by [facility] includes sequencing and/or deletion duplication testing of the following 84 genes: AIP, ALK, APC, ATM, AXIN2,BAP1,  BARD1, BLM, BMPR1A, BRCA1, BRCA2, BRIP1, CASR, CDC73, CDH1, CDK4, CDKN1B, CDKN1C, CDKN2A (p14ARF), CDKN2A (p16INK4a), CEBPA, CHEK2, CTNNA1, DICER1, DIS3L2, EGFR (c.2369C>T, p.Thr790Met variant only), EPCAM (Deletion/duplication testing only), FH, FLCN, GATA2, GPC3, GREM1 (Promoter region deletion/duplication testing only), HOXB13 (c.251G>A, p.Gly84Glu), HRAS, KIT, MAX, MEN1, MET, MITF (c.952G>A, p.Glu318Lys variant only), MLH1, MSH2, MSH3, MSH6, MUTYH, NBN, NF1, NF2, NTHL1, PALB2, PDGFRA, PHOX2B, PMS2, POLD1, POLE, POT1, PRKAR1A, PTCH1, PTEN, RAD50, RAD51C, RAD51D, RB1, RECQL4, RET, RUNX1, SDHAF2, SDHA (sequence changes  only), SDHB, SDHC, SDHD, SMAD4, SMARCA4, SMARCB1, SMARCE1, STK11, SUFU, TERC, TERT, TMEM127, TP53, TSC1, TSC2, VHL, WRN and WT1.  he report date is February 02, 2019.   02/11/2019 Surgery   RT lumpectomy: DICS Intermediate grade, Margins Neg, ER 90%, PR 70%; Lt Lumpectomy: UDH   02/26/2019 -  Anti-estrogen oral therapy   Tamoxifen  20mg  daily, stopped because of depression and facial numbness   05/20/2019 - 06/17/2019 Radiation Therapy   Adjuvant XRT   08/15/2021 Relapse/Recurrence   MRI surveillance detected Right breast calcifications 5 cm, Ant biopsy: IG DCIS with necrosis, ER 40%, PR 0%, Posterior biopsy: Intermediate grade DCIS with [facility]   08/19/2022 Cancer Staging   Staging form: Breast, AJCC 8th Edition - Clinical: Stage IV (cT54mi, cNX, cM1, G3, ER: Unknown, PR: Unknown, HER2: Unknown) - Signed by Cameron Cea, MD on 02/06/2023 Histologic grading system: 3 grade system   02/14/2023 - 01/15/2024 Chemotherapy   Patient is on Treatment Plan : BREAST DOCEtaxel  + Trastuzumab  + Pertuzumab  (THP) q21d x 8 cycles / Trastuzumab  + Pertuzumab  q21d x 4 cycles     02/05/2024 -  Chemotherapy   Patient is on Treatment Plan : BREAST Trastuzumab   + Pertuzumab  q21d x 13 cycles     AML (acute myeloid leukemia) (HCC)  09/1998 Initial Diagnosis   AML (acute myeloid leukemia) (HCC) treated with 2 induction regimens followed by 6 consolidation regimens, remission     CHIEF COMPLIANT: Herceptin  Perjeta  maintenance  HISTORY OF PRESENT ILLNESS:   History of Present Illness The patient, with a history of an unspecified condition requiring regular treatments, presents with fatigue and bloating. She denies changes in activity level and attributes the fatigue to the season. She also reports feeling bloated and having difficulty losing weight despite not being a big eater. She is  concerned about the weight gain, which she has never experienced before, even during pregnancy. She suspects that hormonal changes might be  contributing to these symptoms, as her menstrual cycles have been irregular and heavy lately. She has seen her gynecologist, Dr. Hilda Lovings, about this issue, and everything was reportedly fine.  The patient also expresses a desire to travel to [place] for 6-8 weeks in the summer, provided her upcoming scans in June show no issues. She is willing to skip a treatment to accommodate this trip, but expresses concern about the potential impact on her health.     ALLERGIES:  is allergic to levaquin  [levofloxacin ], tamoxifen  citrate, and tape.  MEDICATIONS:  Current Outpatient Medications  Medication Sig Dispense Refill   acetaminophen  (TYLENOL ) 325 MG tablet Take 650 mg by mouth every 6 (six) hours as needed.     ALPRAZolam (XANAX) 0.25 MG tablet      cholecalciferol (VITAMIN D3) 25 MCG (1000 UNIT) tablet Take 2,000 Units by mouth daily.     fluconazole  (DIFLUCAN ) 100 MG tablet Take 1 tablet (100 mg total) by mouth daily. 15 tablet 1   fluticasone (FLONASE) 50 MCG/ACT nasal spray Place 1 spray into both nostrils daily.     Ibuprofen  (ADVIL ) 200 MG CAPS Take 1 capsule by mouth as needed.     loratadine (CLARITIN) 10 MG tablet 1 tablet Orally Once a day     metroNIDAZOLE (FLAGYL) 500 MG tablet Take 500 mg by mouth 2 (two) times daily.     phenylephrine  (SUDAFED PE) 10 MG TABS tablet Take 10 mg by mouth as needed.     sacubitril -valsartan  (ENTRESTO ) 49-51 MG Take 1 tablet by mouth 2 (two) times daily. 60 tablet 11   Zinc-Vitamin C (ZINC-A-COLD/VITAMIN C MT) Use as directed 1 capsule in the mouth or throat daily.     metoprolol  succinate (TOPROL  XL) 25 MG 24 hr tablet Take 1 tablet (25 mg total) by mouth at bedtime. (Patient not taking: Reported on 03/18/2024) 30 tablet 3   No current facility-administered medications for this visit.   Facility-Administered Medications Ordered in Other Visits  Medication Dose Route Frequency Provider Last Rate Last Admin   0.9 %  sodium chloride  infusion   Intravenous  Continuous Maylani Embree, MD 10 mL/hr at 03/18/24 1210 New Bag at 03/18/24 1210   heparin  lock flush 100 unit/mL  500 Units Intracatheter Once PRN Cameron Cea, MD       pertuzumab  (PERJETA ) 420 mg in sodium chloride  0.9 % 250 mL chemo infusion  420 mg Intravenous Once Toren Tucholski, MD 528 mL/hr at 03/18/24 1321 420 mg at 03/18/24 1321   sodium chloride  flush (NS) 0.9 % injection 10 mL  10 mL Intracatheter PRN Cameron Cea, MD        PHYSICAL EXAMINATION: ECOG PERFORMANCE STATUS: 1 - Symptomatic but completely ambulatory  Vitals:   03/18/24 1000  BP: 114/78  Pulse: 77  Resp: 18  Temp: 98.6 F (37 C)  SpO2: 100%   Filed Weights   03/18/24 1000  Weight: 175 lb 12.8 oz (79.7 kg)      LABORATORY DATA:  I have reviewed the data as listed    Latest Ref Rng & Units 03/18/2024   11:59 AM 01/15/2024    9:26 AM 12/04/2023    9:28 AM  CMP  Glucose 70 - 99 mg/dL 409  811  99   BUN 6 - 20 mg/dL 15  18  14    Creatinine 0.44 - 1.00 mg/dL 9.14  0.72  0.69   Sodium 135 - 145 mmol/L 139  139  138   Potassium 3.5 - 5.1 mmol/L 3.7  3.8  3.9   Chloride 98 - 111 mmol/L 107  107  105   CO2 22 - 32 mmol/L 26  26  27    Calcium 8.9 - 10.3 mg/dL 8.9  8.5  9.0   Total Protein 6.5 - 8.1 g/dL 6.9  6.4  6.9   Total Bilirubin 0.0 - 1.2 mg/dL 0.3  0.2  0.5   Alkaline Phos 38 - 126 U/L 78  90  95   AST 15 - 41 U/L 12  11  10    ALT 0 - 44 U/L 14  13  12      Lab Results  Component Value Date   WBC 5.3 03/18/2024   HGB 12.9 03/18/2024   HCT 38.6 03/18/2024   MCV 87.5 03/18/2024   PLT 223 03/18/2024   NEUTROABS 2.8 03/18/2024    ASSESSMENT & PLAN:  Breast cancer metastasized to liver (HCC) 02/11/19: RT lumpectomy: DICS Intermediate grade, Margins Neg, ER 90%, PR 70%; Lt Lumpectomy: UDH status post radiation, could not tolerate tamoxifen  recurrence: 08/15/2021:MRI surveillance detected Right breast calcifications 5 cm, Ant biopsy: IG DCIS with necrosis, ER 40%, PR 0%, Posterior biopsy: Intermediate  grade DCIS with [facility]  11/29/2021: Right mastectomy: High-grade DCIS with microinvasion, ER 0%, PR 0%, HER2 positive, 0/1 lymph node negative  T1c MIC, N0: Stage Ia   Patient had second opinion at [facility] with Dr. Patra Bonnet who agreed with her plan of no systemic chemotherapy   Breast cancer surveillance: 1.  Mammogram 08/22/2022: Left breast benign breast density category B  2.  CT CAP 12/09/2022: Multiple liver lesions indicative of cysts and hemangiomas new right hepatic lobe lesion favored to be hemangioma otherwise no evidence of metastatic disease.  Radiology recommended an MRI of the liver. 3.  Liver MRI 01/08/2023: 2 New rim-enhancing lesions 2.3 cm (was 1.6 cm on 12/09/2022), 1.8 cm (measured 1.5 cm), benign hemangioma 2.4 cm and 1.9 cm and 1.3 cm unchanged 4.  PET CT scan 01/22/2023: Liver: 2.5 cm and 2.4 cm lesions ------------------------------------------------------------------------------------------------------------------------------------------------- 01/31/2023: Liver lesion biopsy: Metastatic adenocarcinoma to the liver consistent with breast primary positive for CK7 and GATA3, ER 0%, PR 0%, Ki-67 25%, HER2 3+ positive, [facility] molecular testing 02/21/2023: ER negative PR negative, HER2 amplified, ER positive TMB 4, BRCA negative, ESR 1 unmutated, PD-L1 negative, CPS: 1, PTEN IHC +2+ but mutation not detected   Current treatment: Completed 6 cycles of Taxotere  Herceptin  Perjeta  on 05/30/2023, currently on maintenance Herceptin  and Perjeta  (now being given intravenously because of skin irritation from injections)   Herceptin  Perjeta  toxicities:  Cardiomyopathy: Fatigue and palpitations on Entresto  and metoprolol .  Follows cardiology Meningioma: Follows with Dr. Mark Sil, brain MRI  08/04/2023: Stable, new brain MRI has been ordered   PET/CT: 06/19/2023: Interval resolution of the hypermetabolic lesions in the liver. PET/CT 10/03/2023: No evidence of recurrent/metastatic cancer low-density liver  lesions less conspicuous without any hypermetabolic activity 01/08/2024: CT CAP: Stable liver lesions 2.2 cm, 2 cm.  No new lesions.  Stable bilateral pulmonary nodules (considered benign) 02/11/2024: PET/CT scan: No definite evidence of hypermetabolic metastatic disease.  Equivocal small focus of metabolic activity dome of right hepatic lobe corresponding to the MRI finding.  Remains indeterminate (suggest liver MRI in 3 to 6 months), stable lung nodules   Return to clinic every 3 weeks for Herceptin  and Perjeta   We would like to move  her MRI abdomen from 6/25 to 05/05/2024. She has plans to go to [place] for 6 weeks or so if the scans are good. We might skip her treatment if she does decide to travel.  Assessment & Plan Menstrual irregularities Irregular cycles with recent heavy bleeding, likely due to estrogen deprivation. Recent evaluation by Dr. Hilda Lovings was normal. - Order hemoglobin and relevant labs to assess for anemia or other underlying issues.  Weight gain Weight gain despite low intake, likely due to hormonal changes. Emphasized diet and activity balance in weight management. - Encourage increased physical activity, possibly by joining a gym or consulting a fitness trainer.  Travel plans and treatment schedule Plans to travel to [place] for 6-8 weeks if scans are stable. Skipping one treatment is acceptable if scans are stable. Travel reconsideration needed if scans show progression. - Reschedule MRI abdomen from June 25 to June 11 for travel planning. - Consider skipping one treatment if scans are stable to accommodate travel plans. - Discuss scan results and travel plans after receiving scan results.      Orders Placed This Encounter  Procedures   CBC with Differential (Cancer Center Only)    Standing Status:   Future    Number of Occurrences:   1    Expiration Date:   03/18/2025   CMP (Cancer Center only)    Standing Status:   Future    Number of Occurrences:   1    Expiration  Date:   03/18/2025   The patient has a good understanding of the overall plan. she agrees with it. she will call with any problems that may develop before the next visit here. Total time spent: 30 mins including face to face time and time spent for planning, charting and co-ordination of care   Viinay K Rankin Coolman, MD 03/18/24

## 2024-03-24 DIAGNOSIS — E7800 Pure hypercholesterolemia, unspecified: Secondary | ICD-10-CM | POA: Diagnosis not present

## 2024-03-24 DIAGNOSIS — I427 Cardiomyopathy due to drug and external agent: Secondary | ICD-10-CM | POA: Diagnosis not present

## 2024-03-24 DIAGNOSIS — T451X5A Adverse effect of antineoplastic and immunosuppressive drugs, initial encounter: Secondary | ICD-10-CM | POA: Diagnosis not present

## 2024-03-24 DIAGNOSIS — R7301 Impaired fasting glucose: Secondary | ICD-10-CM | POA: Diagnosis not present

## 2024-03-24 DIAGNOSIS — I10 Essential (primary) hypertension: Secondary | ICD-10-CM | POA: Diagnosis not present

## 2024-04-07 NOTE — Progress Notes (Unsigned)
 [facility] Cancer Follow up:    Joann Pinal, MD [address] [place] [address]   DIAGNOSIS: Cancer Staging  Breast cancer metastasized to liver [facility]) Staging form: Breast, AJCC 8th Edition - Clinical: Stage IV (cT22mi, cNX, cM1, G3, ER: Unknown, PR: Unknown, HER2: Unknown) - Signed by Cameron Cea, MD on 02/06/2023 Histologic grading system: 3 grade system    SUMMARY OF ONCOLOGIC HISTORY: Oncology History  Breast cancer metastasized to liver (HCC)  01/25/2019 Initial Diagnosis   Screening mammogram showed bilateral calcifications right breast 5 mm, pleomorphic.  Left breast 1.6 cm punctate, smaller group of calcifications spanning 3 mm.  Right lower outer quadrant biopsy revealed low-grade DCIS ER 90%, PR 70%; biopsy of left breast calcifications ALH and PASH   01/31/2019 Genetic Testing   ATM c.8495G>A and RECQL4 c.2587G>A VUS identified on the 9-gene STAT panel and Multi-cancer panel through [facility].  The STAT Breast cancer panel offered by [facility] includes sequencing and rearrangement analysis for the following 9 genes:  ATM, BRCA1, BRCA2, CDH1, CHEK2, PALB2, PTEN, STK11 and TP53.   The Multi-Gene Panel offered by [facility] includes sequencing and/or deletion duplication testing of the following 84 genes: AIP, ALK, APC, ATM, AXIN2,BAP1,  BARD1, BLM, BMPR1A, BRCA1, BRCA2, BRIP1, CASR, CDC73, CDH1, CDK4, CDKN1B, CDKN1C, CDKN2A (p14ARF), CDKN2A (p16INK4a), CEBPA, CHEK2, CTNNA1, DICER1, DIS3L2, EGFR (c.2369C>T, p.Thr790Met variant only), EPCAM (Deletion/duplication testing only), FH, FLCN, GATA2, GPC3, GREM1 (Promoter region deletion/duplication testing only), HOXB13 (c.251G>A, p.Gly84Glu), HRAS, KIT, MAX, MEN1, MET, MITF (c.952G>A, p.Glu318Lys variant only), MLH1, MSH2, MSH3, MSH6, MUTYH, NBN, NF1, NF2, NTHL1, PALB2, PDGFRA, PHOX2B, PMS2, POLD1, POLE, POT1, PRKAR1A, PTCH1, PTEN, RAD50, RAD51C, RAD51D, RB1, RECQL4, RET, RUNX1, SDHAF2, SDHA (sequence changes  only), SDHB, SDHC, SDHD, SMAD4, SMARCA4, SMARCB1, SMARCE1, STK11, SUFU, TERC, TERT, TMEM127, TP53, TSC1, TSC2, VHL, WRN and WT1.  he report date is February 02, 2019.   02/11/2019 Surgery   RT lumpectomy: DICS Intermediate grade, Margins Neg, ER 90%, PR 70%; Lt Lumpectomy: UDH   02/26/2019 -  Anti-estrogen oral therapy   Tamoxifen  20mg  daily, stopped because of depression and facial numbness   05/20/2019 - 06/17/2019 Radiation Therapy   Adjuvant XRT   08/15/2021 Relapse/Recurrence   MRI surveillance detected Right breast calcifications 5 cm, Ant biopsy: IG DCIS with necrosis, ER 40%, PR 0%, Posterior biopsy: Intermediate grade DCIS with [facility]   08/19/2022 Cancer Staging   Staging form: Breast, AJCC 8th Edition - Clinical: Stage IV (cT33mi, cNX, cM1, G3, ER: Unknown, PR: Unknown, HER2: Unknown) - Signed by Cameron Cea, MD on 02/06/2023 Histologic grading system: 3 grade system   02/14/2023 - 01/15/2024 Chemotherapy   Patient is on Treatment Plan : BREAST DOCEtaxel  + Trastuzumab  + Pertuzumab  (THP) q21d x 8 cycles / Trastuzumab  + Pertuzumab  q21d x 4 cycles     02/05/2024 -  Chemotherapy   Patient is on Treatment Plan : BREAST Trastuzumab   + Pertuzumab  q21d x 13 cycles     AML (acute myeloid leukemia) (HCC)  09/1998 Initial Diagnosis   AML (acute myeloid leukemia) (HCC) treated with 2 induction regimens followed by 6 consolidation regimens, remission     CURRENT THERAPY: Herceptin  Perjeta   INTERVAL HISTORY:  Joann Meadows 48 y.o. female returns for follow-up and evaluation prior to receiving Herceptin  Perjeta  therapy.  Her most recent echocardiogram occurred on January 30, 2024 demonstrating a normal left ventricular ejection fraction of 55 to 60% and normal global longitudinal strain.  She has begun exercising every day and is  enjoying it.  Her visit with her PCP recently demonstrated elevated cholesterol levels and she is working on diet changes as well.     Patient Active Problem List    Diagnosis Date Noted   Other specified disorders of eustachian tube, bilateral 09/05/2023   Chronic rhinitis 09/05/2023   Swimmer's ear of right side 09/03/2023   Port-A-Cath in place 03/07/2023   Meningioma (HCC) 01/30/2023   Liver lesion 01/23/2023   COPD possible, would be Gold Group B 11/25/2019   DOE (dyspnea on exertion) 11/24/2019   Encounter for antineoplastic chemotherapy 02/02/2019   Breast cancer metastasized to liver (HCC) 01/25/2019   AML (acute myeloid leukemia) (HCC) 01/25/2019   Family history of colon cancer    Family history of leukemia    Delivery normal 03/25/2017    is allergic to levaquin  [levofloxacin ], tamoxifen  citrate, and tape.  MEDICAL HISTORY: Past Medical History:  Diagnosis Date   Breast cancer (HCC) 2020   Right Breast Cancer   Endometriosis    Fibroid    Hx of radiation therapy 06/2019   Leukemia (HCC)    20 years ago   Personal history of radiation therapy 2020   Right Breast Cancer   PONV (postoperative nausea and vomiting)    pt states she vomits after colonoscopies    SURGICAL HISTORY: Past Surgical History:  Procedure Laterality Date   BREAST EXCISIONAL BIOPSY Left 02/11/2019   [facility]   BREAST LUMPECTOMY Right 02/11/2019   BREAST LUMPECTOMY WITH RADIOACTIVE SEED LOCALIZATION Bilateral 02/11/2019   Procedure: BILATERAL BREAST LUMPECTOMIES WITH BILATERAL RADIOACTIVE SEED LOCALIZATION;  Surgeon: Dareen Ebbing, MD;  Location: [facility];  Service: General;  Laterality: Bilateral;   IR IMAGING GUIDED PORT INSERTION  02/25/2023   IR RADIOLOGIST EVAL & MGMT  02/27/2023   IR RADIOLOGIST EVAL & MGMT  01/22/2024   IR RADIOLOGIST EVAL & MGMT  01/29/2024   MASTECTOMY Right 11/2021    SOCIAL HISTORY: Social History   Socioeconomic History   Marital status: Single    Spouse name: Not on file   Number of children: 2   Years of education: Not on file   Highest education level: High school graduate  Occupational History   Not on  file  Tobacco Use   Smoking status: Former    Current packs/day: 0.00    Types: Cigarettes    Start date: 01/24/2005    Quit date: 01/25/2015    Years since quitting: 9.2   Smokeless tobacco: Never  Vaping Use   Vaping status: Never Used  Substance and Sexual Activity   Alcohol use: No   Drug use: No   Sexual activity: Yes    Birth control/protection: None  Other Topics Concern   Not on file  Social History Narrative   Lives with children   Social Drivers of Health   Financial Resource Strain: Not on file  Food Insecurity: No Food Insecurity (10/03/2022)   Hunger Vital Sign    Worried About Running Out of Food in the Last Year: Never true    Ran Out of Food in the Last Year: Never true  Transportation Needs: No Transportation Needs (10/03/2022)   PRAPARE - Administrator, Civil Service (Medical): No    Lack of Transportation (Non-Medical): No  Physical Activity: Not on file  Stress: Not on file  Social Connections: Not on file  Intimate Partner Violence: Not At Risk (02/26/2019)   Humiliation, Afraid, Rape, and Kick questionnaire    Fear of  Current or Ex-Partner: No    Emotionally Abused: No    Physically Abused: No    Sexually Abused: No    FAMILY HISTORY: Family History  Problem Relation Age of Onset   Colon cancer Mother 9       d. 55   Hypertension Father    Diabetes Maternal Aunt    Diabetes Maternal Uncle    Mental retardation Maternal Uncle    Diabetes Paternal Aunt    Diabetes Maternal Grandmother    Stroke Paternal Grandmother        c. 81   Leukemia Paternal Grandfather        d. 27; ?CLL   Leukemia Cousin 4       mat first cousin   Lymphoma Cousin 40       pat first cousin    Review of Systems  Constitutional:  Negative for appetite change, chills, fatigue, fever and unexpected weight change.  HENT:   Negative for hearing loss, lump/mass and trouble swallowing.   Eyes:  Negative for eye problems and icterus.  Respiratory:  Negative  for chest tightness, cough and shortness of breath.   Cardiovascular:  Negative for chest pain, leg swelling and palpitations.  Gastrointestinal:  Negative for abdominal distention, abdominal pain, constipation, diarrhea, nausea and vomiting.  Endocrine: Negative for hot flashes.  Genitourinary:  Negative for difficulty urinating.   Musculoskeletal:  Negative for arthralgias.  Skin:  Negative for itching and rash.  Neurological:  Negative for dizziness, extremity weakness, headaches and numbness.  Hematological:  Negative for adenopathy. Does not bruise/bleed easily.  Psychiatric/Behavioral:  Negative for depression. The patient is not nervous/anxious.       PHYSICAL EXAMINATION    There were no vitals filed for this visit.  Physical Exam Constitutional:      General: She is not in acute distress.    Appearance: Normal appearance. She is not toxic-appearing.  HENT:     Head: Normocephalic and atraumatic.     Mouth/Throat:     Mouth: Mucous membranes are moist.     Pharynx: Oropharynx is clear. No oropharyngeal exudate or posterior oropharyngeal erythema.  Eyes:     General: No scleral icterus. Cardiovascular:     Rate and Rhythm: Normal rate and regular rhythm.     Pulses: Normal pulses.     Heart sounds: Normal heart sounds.  Pulmonary:     Effort: Pulmonary effort is normal.     Breath sounds: Normal breath sounds.  Abdominal:     General: Abdomen is flat. Bowel sounds are normal. There is no distension.     Palpations: Abdomen is soft.     Tenderness: There is no abdominal tenderness.  Musculoskeletal:        General: No swelling.     Cervical back: Neck supple.  Lymphadenopathy:     Cervical: No cervical adenopathy.  Skin:    General: Skin is warm and dry.     Findings: No rash.  Neurological:     General: No focal deficit present.     Mental Status: She is alert.  Psychiatric:        Mood and Affect: Mood normal.        Behavior: Behavior normal.      LABORATORY DATA:  CBC    Component Value Date/Time   WBC 5.3 03/18/2024 1159   WBC 2.7 (L) 02/26/2023 0001   RBC 4.41 03/18/2024 1159   HGB 12.9 03/18/2024 1159   HCT 38.6 03/18/2024 1159  PLT 223 03/18/2024 1159   MCV 87.5 03/18/2024 1159   MCH 29.3 03/18/2024 1159   MCHC 33.4 03/18/2024 1159   RDW 13.1 03/18/2024 1159   LYMPHSABS 1.9 03/18/2024 1159   MONOABS 0.4 03/18/2024 1159   EOSABS 0.2 03/18/2024 1159   BASOSABS 0.0 03/18/2024 1159    CMP     Component Value Date/Time   NA 139 03/18/2024 1159   K 3.7 03/18/2024 1159   CL 107 03/18/2024 1159   CO2 26 03/18/2024 1159   GLUCOSE 124 (H) 03/18/2024 1159   BUN 15 03/18/2024 1159   CREATININE 0.63 03/18/2024 1159   CALCIUM 8.9 03/18/2024 1159   PROT 6.9 03/18/2024 1159   ALBUMIN 4.3 03/18/2024 1159   AST 12 (L) 03/18/2024 1159   ALT 14 03/18/2024 1159   ALKPHOS 78 03/18/2024 1159   BILITOT 0.3 03/18/2024 1159   GFRNONAA >60 03/18/2024 1159   GFRAA >60 10/27/2019 1528     ASSESSMENT and THERAPY PLAN:   No problem-specific Assessment & Plan notes found for this encounter.     All questions were answered. The patient knows to call the clinic with any problems, questions or concerns. We can certainly see the patient much sooner if necessary.  Total encounter time:*** minutes*in face-to-face visit time, chart review, lab review, care coordination, order entry, and documentation of the encounter time.    Alwin Baars, NP 04/07/24 11:42 AM Medical Oncology and Hematology [facility] [address] [place], [place] [address] Tel. [phone]    Fax. [phone]  *Total Encounter Time as defined by the Centers for Medicare and Medicaid Services includes, in addition to the face-to-face time of a patient visit (documented in the note above) non-face-to-face time: obtaining and reviewing outside history, ordering and reviewing medications, tests or procedures, care coordination  (communications with other health care professionals or caregivers) and documentation in the medical record.

## 2024-04-08 ENCOUNTER — Inpatient Hospital Stay (HOSPITAL_BASED_OUTPATIENT_CLINIC_OR_DEPARTMENT_OTHER): Admitting: Adult Health

## 2024-04-08 ENCOUNTER — Inpatient Hospital Stay: Attending: Hematology and Oncology

## 2024-04-08 VITALS — BP 136/90 | HR 72 | Resp 16

## 2024-04-08 DIAGNOSIS — E7800 Pure hypercholesterolemia, unspecified: Secondary | ICD-10-CM | POA: Diagnosis not present

## 2024-04-08 DIAGNOSIS — D0511 Intraductal carcinoma in situ of right breast: Secondary | ICD-10-CM | POA: Insufficient documentation

## 2024-04-08 DIAGNOSIS — C50919 Malignant neoplasm of unspecified site of unspecified female breast: Secondary | ICD-10-CM | POA: Diagnosis not present

## 2024-04-08 DIAGNOSIS — Z170 Estrogen receptor positive status [ER+]: Secondary | ICD-10-CM | POA: Insufficient documentation

## 2024-04-08 DIAGNOSIS — Z79899 Other long term (current) drug therapy: Secondary | ICD-10-CM | POA: Insufficient documentation

## 2024-04-08 DIAGNOSIS — Z5112 Encounter for antineoplastic immunotherapy: Secondary | ICD-10-CM | POA: Diagnosis not present

## 2024-04-08 DIAGNOSIS — C787 Secondary malignant neoplasm of liver and intrahepatic bile duct: Secondary | ICD-10-CM | POA: Diagnosis not present

## 2024-04-08 LAB — PREGNANCY, URINE: Preg Test, Ur: NEGATIVE

## 2024-04-08 MED ORDER — ACETAMINOPHEN 325 MG PO TABS
650.0000 mg | ORAL_TABLET | Freq: Once | ORAL | Status: AC
  Administered 2024-04-08: 650 mg via ORAL
  Filled 2024-04-08: qty 2

## 2024-04-08 MED ORDER — DIPHENHYDRAMINE HCL 25 MG PO CAPS
50.0000 mg | ORAL_CAPSULE | Freq: Once | ORAL | Status: AC
  Administered 2024-04-08: 50 mg via ORAL
  Filled 2024-04-08: qty 2

## 2024-04-08 MED ORDER — SODIUM CHLORIDE 0.9% FLUSH
10.0000 mL | INTRAVENOUS | Status: DC | PRN
  Administered 2024-04-08: 10 mL

## 2024-04-08 MED ORDER — SODIUM CHLORIDE 0.9 % IV SOLN: INTRAVENOUS | Status: DC

## 2024-04-08 MED ORDER — SODIUM CHLORIDE 0.9 % IV SOLN
420.0000 mg | Freq: Once | INTRAVENOUS | Status: AC
  Administered 2024-04-08: 420 mg via INTRAVENOUS
  Filled 2024-04-08: qty 14

## 2024-04-08 MED ORDER — TRASTUZUMAB-DKST CHEMO 150 MG IV SOLR
6.0000 mg/kg | Freq: Once | INTRAVENOUS | Status: AC
  Administered 2024-04-08: 483 mg via INTRAVENOUS
  Filled 2024-04-08: qty 23

## 2024-04-08 MED ORDER — HEPARIN SOD (PORK) LOCK FLUSH 100 UNIT/ML IV SOLN
500.0000 [IU] | Freq: Once | INTRAVENOUS | Status: AC | PRN
Start: 2024-04-08 — End: 2024-04-08
  Administered 2024-04-08: 500 [IU]

## 2024-04-08 NOTE — Patient Instructions (Signed)
 [facility] - A DEPT OF [facility]  Discharge Instructions: Thank you for choosing [facility] to provide your oncology and hematology care.   If you have a lab appointment with the Cancer Center, please go directly to the Cancer Center and check in at the registration area.   Wear comfortable clothing and clothing appropriate for easy access to any Portacath or PICC line.   We strive to give you quality time with your provider. You may need to reschedule your appointment if you arrive late (15 or more minutes).  Arriving late affects you and other patients whose appointments are after yours.  Also, if you miss three or more appointments without notifying the office, you may be dismissed from the clinic at the provider's discretion.      For prescription refill requests, have your pharmacy contact our office and allow 72 hours for refills to be completed.    Today you received the following chemotherapy and/or immunotherapy agents: Trastuzumab and Pertuzumab      To help prevent nausea and vomiting after your treatment, we encourage you to take your nausea medication as directed.  BELOW ARE SYMPTOMS THAT SHOULD BE REPORTED IMMEDIATELY: *FEVER GREATER THAN 100.4 F (38 C) OR HIGHER *CHILLS OR SWEATING *NAUSEA AND VOMITING THAT IS NOT CONTROLLED WITH YOUR NAUSEA MEDICATION *UNUSUAL SHORTNESS OF BREATH *UNUSUAL BRUISING OR BLEEDING *URINARY PROBLEMS (pain or burning when urinating, or frequent urination) *BOWEL PROBLEMS (unusual diarrhea, constipation, pain near the anus) TENDERNESS IN MOUTH AND THROAT WITH OR WITHOUT PRESENCE OF ULCERS (sore throat, sores in mouth, or a toothache) UNUSUAL RASH, SWELLING OR PAIN  UNUSUAL VAGINAL DISCHARGE OR ITCHING   Items with * indicate a potential emergency and should be followed up as soon as possible or go to the Emergency Department if any problems should occur.  Please show the CHEMOTHERAPY ALERT CARD  or IMMUNOTHERAPY ALERT CARD at check-in to the Emergency Department and triage nurse.  Should you have questions after your visit or need to cancel or reschedule your appointment, please contact [facility] - A DEPT OF [facility]  Dept: [phone]  and follow the prompts.  Office hours are 8:00 a.m. to 4:30 p.m. Monday - Friday. Please note that voicemails left after 4:00 p.m. may not be returned until the following business day.  We are closed weekends and major holidays. You have access to a nurse at all times for urgent questions. Please call the main number to the clinic Dept: [phone] and follow the prompts.   For any non-urgent questions, you may also contact your provider using MyChart. We now offer e-Visits for anyone 44 and older to request care online for non-urgent symptoms. For details visit [email or web address].   Also download the MyChart app! Go to the app store, search "MyChart", open the app, select [place], and log in with your MyChart username and password.

## 2024-04-08 NOTE — Progress Notes (Signed)
 Pt declined 30 minutes observation foloowing Perjeta  infusion. Pt tolerated Tx well w/out incident. Ambulatory to lobby.

## 2024-04-09 ENCOUNTER — Encounter: Payer: Self-pay | Admitting: Adult Health

## 2024-04-09 ENCOUNTER — Other Ambulatory Visit (HOSPITAL_COMMUNITY): Payer: Self-pay | Admitting: Family Medicine

## 2024-04-09 ENCOUNTER — Encounter: Payer: Self-pay | Admitting: Hematology and Oncology

## 2024-04-09 DIAGNOSIS — E7800 Pure hypercholesterolemia, unspecified: Secondary | ICD-10-CM

## 2024-04-09 NOTE — Assessment & Plan Note (Signed)
 02/11/19: RT lumpectomy: DICS Intermediate grade, Margins Neg, ER 90%, PR 70%; Lt Lumpectomy: UDH status post radiation, could not tolerate tamoxifen  recurrence: 08/15/2021:MRI surveillance detected Right breast calcifications 5 cm, Ant biopsy: IG DCIS with necrosis, ER 40%, PR 0%, Posterior biopsy: Intermediate grade DCIS with [facility]  11/29/2021: Right mastectomy: High-grade DCIS with microinvasion, ER 0%, PR 0%, HER2 positive, 0/1 lymph node negative  T1c MIC, N0: Stage Ia   Patient had second opinion at [facility] with Dr. Patra Bonnet who agreed with her plan of no systemic chemotherapy   Breast cancer surveillance: 1.  Mammogram 08/22/2022: Left breast benign breast density category B  2.  CT CAP 12/09/2022: Multiple liver lesions indicative of cysts and hemangiomas new right hepatic lobe lesion favored to be hemangioma otherwise no evidence of metastatic disease.  Radiology recommended an MRI of the liver. 3.  Liver MRI 01/08/2023: 2 New rim-enhancing lesions 2.3 cm (was 1.6 cm on 12/09/2022), 1.8 cm (measured 1.5 cm), benign hemangioma 2.4 cm and 1.9 cm and 1.3 cm unchanged 4.  PET CT scan 01/22/2023: Liver: 2.5 cm and 2.4 cm lesions ------------------------------------------------------------------------------------------------------------------------------------------------- 01/31/2023: Liver lesion biopsy: Metastatic adenocarcinoma to the liver consistent with breast primary positive for CK7 and GATA3, ER 0%, PR 0%, Ki-67 25%, HER2 3+ positive, [facility] molecular testing 02/21/2023: ER negative PR negative, HER2 amplified, ER positive TMB 4, BRCA negative, ESR 1 unmutated, PD-L1 negative, CPS: 1, PTEN IHC +2+ but mutation not detected Meningioma: Follows with Dr. Mark Sil, brain MRI  08/04/2023: Stable, new brain MRI has been ordered   PET/CT: 06/19/2023: Interval resolution of the hypermetabolic lesions in the liver. PET/CT 10/03/2023: No evidence of recurrent/metastatic cancer low-density liver lesions less conspicuous  without any hypermetabolic activity 01/08/2024: CT CAP: Stable liver lesions 2.2 cm, 2 cm.  No new lesions.  Stable bilateral pulmonary nodules (considered benign) 02/11/2024: PET/CT scan: No definite evidence of hypermetabolic metastatic disease.  Equivocal small focus of metabolic activity dome of right hepatic lobe corresponding to the MRI finding.  Remains indeterminate (suggest liver MRI in 3 to 6 months), stable lung nodules  Current treatment: Completed 6 cycles of Taxotere  Herceptin  Perjeta  on 05/30/2023, currently on maintenance Herceptin  and Perjeta  (now being given intravenously because of skin irritation from injections)   Herceptin  Perjeta  toxicities:  Cardiomyopathy: Fatigue and palpitations on Entresto  and metoprolol .  Follows cardiology   She saw Dr. Clarance Cristal at [facility] for second opinion  MRI of the liver is scheduled for next month. Recommended to continue on Herceptin  Perjeta .  I congratulated her on her new diet and exercise program.   Return to clinic every 3 weeks for Herceptin  and Perjeta 

## 2024-04-26 ENCOUNTER — Ambulatory Visit (INDEPENDENT_AMBULATORY_CARE_PROVIDER_SITE_OTHER): Admitting: Otolaryngology

## 2024-04-26 ENCOUNTER — Encounter (HOSPITAL_COMMUNITY): Payer: Self-pay

## 2024-04-26 ENCOUNTER — Ambulatory Visit (HOSPITAL_COMMUNITY)
Admission: RE | Admit: 2024-04-26 | Discharge: 2024-04-26 | Disposition: A | Discharge status: Home / Self Care | Payer: Self-pay | Source: Ambulatory Visit | Attending: Family Medicine | Admitting: Family Medicine

## 2024-04-26 ENCOUNTER — Encounter (INDEPENDENT_AMBULATORY_CARE_PROVIDER_SITE_OTHER): Payer: Self-pay | Admitting: Otolaryngology

## 2024-04-26 VITALS — BP 142/88 | HR 78

## 2024-04-26 DIAGNOSIS — R0981 Nasal congestion: Secondary | ICD-10-CM | POA: Diagnosis not present

## 2024-04-26 DIAGNOSIS — H6981 Other specified disorders of Eustachian tube, right ear: Secondary | ICD-10-CM

## 2024-04-26 DIAGNOSIS — E7800 Pure hypercholesterolemia, unspecified: Secondary | ICD-10-CM | POA: Insufficient documentation

## 2024-04-26 DIAGNOSIS — J310 Chronic rhinitis: Secondary | ICD-10-CM

## 2024-04-27 ENCOUNTER — Other Ambulatory Visit: Payer: Self-pay

## 2024-04-27 NOTE — Progress Notes (Signed)
 Patient ID: Joann Meadows, female   DOB: [birth year], 48 y.o.   MRN: [id number]  Follow-up: Right ear eustachian tube dysfunction, recurrent ear infections, chronic nasal congestion  HPI: The patient is a 48 year old female who returns today for her follow-up evaluation.  The patient has a history of chronic nasal congestion, right ear eustachian tube dysfunction, and recurrent ear infections.  At her last visit in October 2024, her ear infection had resolved.  She was noted to have chronic rhinitis with nasal mucosal congestion and right ear eustachian tube dysfunction.  She was treated with Flonase nasal spray and Claritin daily.  She returns today reporting no more ear infection.  However, she continues to have chronic pressure sensation in her right ear.  She denies any significant otalgia, otorrhea, or vertigo.  She denies any change in her hearing.  Exam: General: Communicates without difficulty, well nourished, no acute distress. Head: Normocephalic, no evidence injury, no tenderness, facial buttresses intact without stepoff. Face/sinus: No tenderness to palpation and percussion. Facial movement is normal and symmetric. Eyes: PERRL, EOMI. No scleral icterus, conjunctivae clear. Neuro: CN II exam reveals vision grossly intact.  No nystagmus at any point of gaze. Ears: Auricles well formed without lesions.  Ear canals are intact without mass or lesion.  No erythema or edema is appreciated.  The TMs are intact without fluid. Nose: External evaluation reveals normal support and skin without lesions.  Dorsum is intact.  Anterior rhinoscopy reveals congested mucosa over anterior aspect of inferior turbinates and intact septum.  No purulence noted. Oral:  Oral cavity and oropharynx are intact, symmetric, without erythema or edema.  Mucosa is moist without lesions. Neck: Full range of motion without pain.  There is no significant lymphadenopathy.  No masses palpable.  Thyroid  bed within normal limits to  palpation.  Parotid glands and submandibular glands equal bilaterally without mass.  Trachea is midline. Neuro:  CN 2-12 grossly intact.   Assessment: 1.  Chronic rhinitis with nasal mucosal congestion. 2.  Right ear eustachian tube dysfunction. 3.  Her ear canals, tympanic membranes, and middle ear spaces are normal.  No middle ear effusion or infection is noted.  Plan: 1.  The physical exam findings are reviewed with the patient. 2.  Continue with Flonase and Claritin daily. 3.  Valsalva exercises encouraged. 4.  The option of right myringotomy and tube placement to treat her persistent right eustachian tube dysfunction is also discussed. 5.  The patient will return for reevaluation in 6 months.

## 2024-04-28 ENCOUNTER — Encounter: Payer: Self-pay | Admitting: Hematology and Oncology

## 2024-04-29 ENCOUNTER — Inpatient Hospital Stay: Admitting: Adult Health

## 2024-04-29 ENCOUNTER — Other Ambulatory Visit: Payer: Self-pay | Admitting: Adult Health

## 2024-04-29 ENCOUNTER — Inpatient Hospital Stay: Attending: Hematology and Oncology

## 2024-04-29 VITALS — BP 136/91 | HR 74 | Temp 97.7°F | Wt 171.6 lb

## 2024-04-29 DIAGNOSIS — C50919 Malignant neoplasm of unspecified site of unspecified female breast: Secondary | ICD-10-CM

## 2024-04-29 DIAGNOSIS — Z170 Estrogen receptor positive status [ER+]: Secondary | ICD-10-CM | POA: Diagnosis not present

## 2024-04-29 DIAGNOSIS — Z79899 Other long term (current) drug therapy: Secondary | ICD-10-CM | POA: Insufficient documentation

## 2024-04-29 DIAGNOSIS — Z5112 Encounter for antineoplastic immunotherapy: Secondary | ICD-10-CM | POA: Diagnosis not present

## 2024-04-29 DIAGNOSIS — C787 Secondary malignant neoplasm of liver and intrahepatic bile duct: Secondary | ICD-10-CM | POA: Insufficient documentation

## 2024-04-29 DIAGNOSIS — D0511 Intraductal carcinoma in situ of right breast: Secondary | ICD-10-CM | POA: Insufficient documentation

## 2024-04-29 DIAGNOSIS — Z923 Personal history of irradiation: Secondary | ICD-10-CM | POA: Insufficient documentation

## 2024-04-29 LAB — CBC WITH DIFFERENTIAL (CANCER CENTER ONLY)
Abs Immature Granulocytes: 0 10*3/uL (ref 0.00–0.07)
Basophils Absolute: 0 10*3/uL (ref 0.0–0.1)
Basophils Relative: 1 %
Eosinophils Absolute: 0.1 10*3/uL (ref 0.0–0.5)
Eosinophils Relative: 3 %
HCT: 37.9 % (ref 36.0–46.0)
Hemoglobin: 12.7 g/dL (ref 12.0–15.0)
Immature Granulocytes: 0 %
Lymphocytes Relative: 37 %
Lymphs Abs: 1.5 10*3/uL (ref 0.7–4.0)
MCH: 28.7 pg (ref 26.0–34.0)
MCHC: 33.5 g/dL (ref 30.0–36.0)
MCV: 85.7 fL (ref 80.0–100.0)
Monocytes Absolute: 0.4 10*3/uL (ref 0.1–1.0)
Monocytes Relative: 10 %
Neutro Abs: 2 10*3/uL (ref 1.7–7.7)
Neutrophils Relative %: 49 %
Platelet Count: 203 10*3/uL (ref 150–400)
RBC: 4.42 MIL/uL (ref 3.87–5.11)
RDW: 13 % (ref 11.5–15.5)
WBC Count: 4.1 10*3/uL (ref 4.0–10.5)
nRBC: 0 % (ref 0.0–0.2)

## 2024-04-29 LAB — CMP (CANCER CENTER ONLY)
ALT: 19 U/L (ref 0–44)
AST: 17 U/L (ref 15–41)
Albumin: 4.3 g/dL (ref 3.5–5.0)
Alkaline Phosphatase: 73 U/L (ref 38–126)
Anion gap: 5 (ref 5–15)
BUN: 12 mg/dL (ref 6–20)
CO2: 28 mmol/L (ref 22–32)
Calcium: 9.1 mg/dL (ref 8.9–10.3)
Chloride: 107 mmol/L (ref 98–111)
Creatinine: 0.7 mg/dL (ref 0.44–1.00)
GFR, Estimated: >60 mL/min (ref >60)
Glucose, Bld: 101 mg/dL — ABNORMAL HIGH (ref 70–99)
Potassium: 3.8 mmol/L (ref 3.5–5.1)
Sodium: 140 mmol/L (ref 135–145)
Total Bilirubin: 0.5 mg/dL (ref 0.0–1.2)
Total Protein: 7 g/dL (ref 6.5–8.1)

## 2024-04-29 LAB — PREGNANCY, URINE: Preg Test, Ur: NEGATIVE

## 2024-04-29 MED ORDER — SODIUM CHLORIDE 0.9 % IV SOLN
420.0000 mg | Freq: Once | INTRAVENOUS | Status: AC
  Administered 2024-04-29: 420 mg via INTRAVENOUS
  Filled 2024-04-29: qty 14

## 2024-04-29 MED ORDER — ACETAMINOPHEN 325 MG PO TABS
650.0000 mg | ORAL_TABLET | Freq: Once | ORAL | Status: AC
  Administered 2024-04-29: 650 mg via ORAL
  Filled 2024-04-29: qty 2

## 2024-04-29 MED ORDER — SODIUM CHLORIDE 0.9% FLUSH: 10.0000 mL | INTRAVENOUS | Status: DC | PRN

## 2024-04-29 MED ORDER — HEPARIN SOD (PORK) LOCK FLUSH 100 UNIT/ML IV SOLN
500.0000 [IU] | Freq: Once | INTRAVENOUS | Status: DC | PRN
Start: 2024-04-29 — End: 2024-04-29

## 2024-04-29 MED ORDER — SODIUM CHLORIDE 0.9 % IV SOLN: INTRAVENOUS | Status: DC

## 2024-04-29 MED ORDER — DIPHENHYDRAMINE HCL 25 MG PO CAPS
50.0000 mg | ORAL_CAPSULE | Freq: Once | ORAL | Status: AC
  Administered 2024-04-29: 50 mg via ORAL
  Filled 2024-04-29: qty 2

## 2024-04-29 MED ORDER — TRASTUZUMAB-ANNS CHEMO 150 MG IV SOLR
6.0000 mg/kg | Freq: Once | INTRAVENOUS | Status: AC
  Administered 2024-04-29: 483 mg via INTRAVENOUS
  Filled 2024-04-29: qty 23

## 2024-04-29 NOTE — Patient Instructions (Signed)
 [facility] - A DEPT OF . [facility]  Discharge Instructions: Thank you for choosing [facility] to provide your oncology and hematology care.   If you have a lab appointment with the Cancer Center, please go directly to the Cancer Center and check in at the registration area.   Wear comfortable clothing and clothing appropriate for easy access to any Portacath or PICC line.   We strive to give you quality time with your provider. You may need to reschedule your appointment if you arrive late (15 or more minutes).  Arriving late affects you and other patients whose appointments are after yours.  Also, if you miss three or more appointments without notifying the office, you may be dismissed from the clinic at the provider's discretion.      For prescription refill requests, have your pharmacy contact our office and allow 72 hours for refills to be completed.    Today you received the following chemotherapy and/or immunotherapy agents perjeta  and kanjinti        To help prevent nausea and vomiting after your treatment, we encourage you to take your nausea medication as directed.  BELOW ARE SYMPTOMS THAT SHOULD BE REPORTED IMMEDIATELY: *FEVER GREATER THAN 100.4 F (38 C) OR HIGHER *CHILLS OR SWEATING *NAUSEA AND VOMITING THAT IS NOT CONTROLLED WITH YOUR NAUSEA MEDICATION *UNUSUAL SHORTNESS OF BREATH *UNUSUAL BRUISING OR BLEEDING *URINARY PROBLEMS (pain or burning when urinating, or frequent urination) *BOWEL PROBLEMS (unusual diarrhea, constipation, pain near the anus) TENDERNESS IN MOUTH AND THROAT WITH OR WITHOUT PRESENCE OF ULCERS (sore throat, sores in mouth, or a toothache) UNUSUAL RASH, SWELLING OR PAIN  UNUSUAL VAGINAL DISCHARGE OR ITCHING   Items with * indicate a potential emergency and should be followed up as soon as possible or go to the Emergency Department if any problems should occur.  Please show the CHEMOTHERAPY ALERT CARD or  IMMUNOTHERAPY ALERT CARD at check-in to the Emergency Department and triage nurse.  Should you have questions after your visit or need to cancel or reschedule your appointment, please contact [facility] - A DEPT OF [facility]  Dept: [phone]  and follow the prompts.  Office hours are 8:00 a.m. to 4:30 p.m. Monday - Friday. Please note that voicemails left after 4:00 p.m. may not be returned until the following business day.  We are closed weekends and major holidays. You have access to a nurse at all times for urgent questions. Please call the main number to the clinic Dept: [phone] and follow the prompts.   For any non-urgent questions, you may also contact your provider using MyChart. We now offer e-Visits for anyone 62 and older to request care online for non-urgent symptoms. For details visit [email or web address].   Also download the MyChart app! Go to the app store, search "MyChart", open the app, select [place], and log in with your MyChart username and password.

## 2024-05-04 ENCOUNTER — Encounter: Payer: Self-pay | Admitting: Hematology and Oncology

## 2024-05-05 ENCOUNTER — Encounter: Payer: Self-pay | Admitting: Hematology and Oncology

## 2024-05-05 ENCOUNTER — Ambulatory Visit
Admission: RE | Admit: 2024-05-05 | Discharge: 2024-05-05 | Disposition: A | Discharge status: Home / Self Care | Source: Ambulatory Visit | Attending: Hematology and Oncology | Admitting: Hematology and Oncology

## 2024-05-05 DIAGNOSIS — C50919 Malignant neoplasm of unspecified site of unspecified female breast: Secondary | ICD-10-CM

## 2024-05-05 DIAGNOSIS — K7689 Other specified diseases of liver: Secondary | ICD-10-CM | POA: Diagnosis not present

## 2024-05-05 DIAGNOSIS — D1803 Hemangioma of intra-abdominal structures: Secondary | ICD-10-CM | POA: Diagnosis not present

## 2024-05-05 MED ORDER — GADOPICLENOL 0.5 MMOL/ML IV SOLN
8.0000 mL | Freq: Once | INTRAVENOUS | Status: AC | PRN
  Administered 2024-05-05: 8 mL via INTRAVENOUS

## 2024-05-06 ENCOUNTER — Inpatient Hospital Stay: Admitting: Hematology and Oncology

## 2024-05-06 VITALS — BP 131/87 | HR 67 | Temp 98.6°F | Resp 17 | Ht 65.0 in | Wt 171.1 lb

## 2024-05-06 DIAGNOSIS — C787 Secondary malignant neoplasm of liver and intrahepatic bile duct: Secondary | ICD-10-CM

## 2024-05-06 DIAGNOSIS — Z5112 Encounter for antineoplastic immunotherapy: Secondary | ICD-10-CM | POA: Diagnosis not present

## 2024-05-06 DIAGNOSIS — Z923 Personal history of irradiation: Secondary | ICD-10-CM | POA: Diagnosis not present

## 2024-05-06 DIAGNOSIS — C50919 Malignant neoplasm of unspecified site of unspecified female breast: Secondary | ICD-10-CM | POA: Diagnosis not present

## 2024-05-06 DIAGNOSIS — Z79899 Other long term (current) drug therapy: Secondary | ICD-10-CM | POA: Diagnosis not present

## 2024-05-06 DIAGNOSIS — D0511 Intraductal carcinoma in situ of right breast: Secondary | ICD-10-CM | POA: Diagnosis not present

## 2024-05-06 DIAGNOSIS — Z170 Estrogen receptor positive status [ER+]: Secondary | ICD-10-CM | POA: Diagnosis not present

## 2024-05-06 NOTE — Assessment & Plan Note (Addendum)
 02/11/19: RT lumpectomy: DICS Intermediate grade, Margins Neg, ER 90%, PR 70%; Lt Lumpectomy: UDH status post radiation, could not tolerate tamoxifen  recurrence: 08/15/2021:MRI surveillance detected Right breast calcifications 5 cm, Ant biopsy: IG DCIS with necrosis, ER 40%, PR 0%, Posterior biopsy: Intermediate grade DCIS with [facility]  11/29/2021: Right mastectomy: High-grade DCIS with microinvasion, ER 0%, PR 0%, HER2 positive, 0/1 lymph node negative  T1c MIC, N0: Stage Ia   Patient had second opinion at [facility] with Dr. Patra Bonnet who agreed with her plan of no systemic chemotherapy   Breast cancer surveillance: 1.  Mammogram 08/22/2022: Left breast benign breast density category B  2.  CT CAP 12/09/2022: Multiple liver lesions indicative of cysts and hemangiomas new right hepatic lobe lesion favored to be hemangioma otherwise no evidence of metastatic disease.  Radiology recommended an MRI of the liver. 3.  Liver MRI 01/08/2023: 2 New rim-enhancing lesions 2.3 cm (was 1.6 cm on 12/09/2022), 1.8 cm (measured 1.5 cm), benign hemangioma 2.4 cm and 1.9 cm and 1.3 cm unchanged 4.  PET CT scan 01/22/2023: Liver: 2.5 cm and 2.4 cm lesions ------------------------------------------------------------------------------------------------------------------------------------------------- 01/31/2023: Liver lesion biopsy: Metastatic adenocarcinoma to the liver consistent with breast primary positive for CK7 and GATA3, ER 0%, PR 0%, Ki-67 25%, HER2 3+ positive, [facility] molecular testing 02/21/2023: ER negative PR negative, HER2 amplified, ER positive TMB 4, BRCA negative, ESR 1 unmutated, PD-L1 negative, CPS: 1, PTEN IHC +2+ but mutation not detected   Current treatment: Completed 6 cycles of Taxotere  Herceptin  Perjeta  on 05/30/2023, currently on maintenance Herceptin  and Perjeta  (now being given intravenously because of skin irritation from injections)   Herceptin  Perjeta  toxicities:  Cardiomyopathy: Fatigue and palpitations on  Entresto  and metoprolol .  Follows cardiology Meningioma: Follows with Dr. Mark Sil, brain MRI  08/04/2023: Stable, new brain MRI has been ordered   PET/CT: 06/19/2023: Interval resolution of the hypermetabolic lesions in the liver. PET/CT 10/03/2023: No evidence of recurrent/metastatic cancer low-density liver lesions less conspicuous without any hypermetabolic activity 01/08/2024: CT CAP: Stable liver lesions 2.2 cm, 2 cm.  No new lesions.  Stable bilateral pulmonary nodules (considered benign) 02/11/2024: PET/CT scan: No definite evidence of hypermetabolic metastatic disease.  Equivocal small focus of metabolic activity dome of right hepatic lobe corresponding to the MRI finding.  Remains indeterminate (suggest liver MRI in 3 to 6 months), stable lung nodules  MRI 05/05/2024: Results pending: To my review there does not appear to be any worsening disease.   Return to clinic every 3 weeks for Herceptin  and Perjeta  She has plans to go to [place] for 5 weeks or so

## 2024-05-06 NOTE — Progress Notes (Signed)
 Patient Care Team: Lanae Pinal, MD as PCP - General (Family Medicine) Alane Hsu, RN as Oncology Nurse Navigator Auther Bo, RN as Oncology Nurse Navigator Cameron Cea, MD as Consulting Physician (Hematology and Oncology) Default, Provider, MD as Technician  DIAGNOSIS:  Encounter Diagnosis  Name Primary?   Carcinoma of breast metastatic to liver, unspecified laterality (HCC) Yes    SUMMARY OF ONCOLOGIC HISTORY: Oncology History  Breast cancer metastasized to liver (HCC)  01/25/2019 Initial Diagnosis   Screening mammogram showed bilateral calcifications right breast 5 mm, pleomorphic.  Left breast 1.6 cm punctate, smaller group of calcifications spanning 3 mm.  Right lower outer quadrant biopsy revealed low-grade DCIS ER 90%, PR 70%; biopsy of left breast calcifications ALH and PASH   01/31/2019 Genetic Testing   ATM c.8495G>A and RECQL4 c.2587G>A VUS identified on the 9-gene STAT panel and Multi-cancer panel through [facility].  The STAT Breast cancer panel offered by [facility] includes sequencing and rearrangement analysis for the following 9 genes:  ATM, BRCA1, BRCA2, CDH1, CHEK2, PALB2, PTEN, STK11 and TP53.   The Multi-Gene Panel offered by [facility] includes sequencing and/or deletion duplication testing of the following 84 genes: AIP, ALK, APC, ATM, AXIN2,BAP1,  BARD1, BLM, BMPR1A, BRCA1, BRCA2, BRIP1, CASR, CDC73, CDH1, CDK4, CDKN1B, CDKN1C, CDKN2A (p14ARF), CDKN2A (p16INK4a), CEBPA, CHEK2, CTNNA1, DICER1, DIS3L2, EGFR (c.2369C>T, p.Thr790Met variant only), EPCAM (Deletion/duplication testing only), FH, FLCN, GATA2, GPC3, GREM1 (Promoter region deletion/duplication testing only), HOXB13 (c.251G>A, p.Gly84Glu), HRAS, KIT, MAX, MEN1, MET, MITF (c.952G>A, p.Glu318Lys variant only), MLH1, MSH2, MSH3, MSH6, MUTYH, NBN, NF1, NF2, NTHL1, PALB2, PDGFRA, PHOX2B, PMS2, POLD1, POLE, POT1, PRKAR1A, PTCH1, PTEN, RAD50, RAD51C, RAD51D, RB1, RECQL4, RET, RUNX1, SDHAF2, SDHA (sequence changes  only), SDHB, SDHC, SDHD, SMAD4, SMARCA4, SMARCB1, SMARCE1, STK11, SUFU, TERC, TERT, TMEM127, TP53, TSC1, TSC2, VHL, WRN and WT1.  he report date is February 02, 2019.   02/11/2019 Surgery   RT lumpectomy: DICS Intermediate grade, Margins Neg, ER 90%, PR 70%; Lt Lumpectomy: UDH   02/26/2019 -  Anti-estrogen oral therapy   Tamoxifen  20mg  daily, stopped because of depression and facial numbness   05/20/2019 - 06/17/2019 Radiation Therapy   Adjuvant XRT   08/15/2021 Relapse/Recurrence   MRI surveillance detected Right breast calcifications 5 cm, Ant biopsy: IG DCIS with necrosis, ER 40%, PR 0%, Posterior biopsy: Intermediate grade DCIS with [facility]   08/19/2022 Cancer Staging   Staging form: Breast, AJCC 8th Edition - Clinical: Stage IV (cT81mi, cNX, cM1, G3, ER: Unknown, PR: Unknown, HER2: Unknown) - Signed by Cameron Cea, MD on 02/06/2023 Histologic grading system: 3 grade system   02/14/2023 - 01/15/2024 Chemotherapy   Patient is on Treatment Plan : BREAST DOCEtaxel  + Trastuzumab  + Pertuzumab  (THP) q21d x 8 cycles / Trastuzumab  + Pertuzumab  q21d x 4 cycles     02/05/2024 -  Chemotherapy   Patient is on Treatment Plan : BREAST Trastuzumab   + Pertuzumab  q21d x 13 cycles     AML (acute myeloid leukemia) (HCC)  09/1998 Initial Diagnosis   AML (acute myeloid leukemia) (HCC) treated with 2 induction regimens followed by 6 consolidation regimens, remission     CHIEF COMPLIANT: Follow-up to review the results of MRI of the liver  HISTORY OF PRESENT ILLNESS: Joann Meadows is a 48 year old with above-mentioned history of metastatic breast cancer with liver metastases who is currently on Herceptin  Perjeta  maintenance.  She is tolerating the treatment extremely well without any problems or concerns.  She had an MRI of the liver and is here today  to discuss results.  She is awaiting the results of that she can plan her trip to [place].  ALLERGIES:  is allergic to levaquin  [levofloxacin ], tamoxifen  citrate, and  tape.  MEDICATIONS:  Current Outpatient Medications  Medication Sig Dispense Refill   acetaminophen  (TYLENOL ) 325 MG tablet Take 650 mg by mouth every 6 (six) hours as needed.     ALPRAZolam (XANAX) 0.25 MG tablet      cholecalciferol (VITAMIN D3) 25 MCG (1000 UNIT) tablet Take 2,000 Units by mouth daily.     fluconazole  (DIFLUCAN ) 100 MG tablet Take 1 tablet (100 mg total) by mouth daily. 15 tablet 1   Ibuprofen  (ADVIL ) 200 MG CAPS Take 1 capsule by mouth as needed.     loratadine (CLARITIN) 10 MG tablet 1 tablet Orally Once a day     metoprolol  succinate (TOPROL  XL) 25 MG 24 hr tablet Take 1 tablet (25 mg total) by mouth at bedtime. 30 tablet 3   metroNIDAZOLE (FLAGYL) 500 MG tablet Take 500 mg by mouth 2 (two) times daily.     phenylephrine  (SUDAFED PE) 10 MG TABS tablet Take 10 mg by mouth as needed.     sacubitril -valsartan  (ENTRESTO ) 49-51 MG Take 1 tablet by mouth 2 (two) times daily. 60 tablet 11   Zinc-Vitamin C (ZINC-A-COLD/VITAMIN C MT) Use as directed 1 capsule in the mouth or throat daily.     fluticasone (FLONASE) 50 MCG/ACT nasal spray Place 1 spray into both nostrils daily. (Patient not taking: Reported on 05/06/2024)     No current facility-administered medications for this visit.    PHYSICAL EXAMINATION: ECOG PERFORMANCE STATUS: 1 - Symptomatic but completely ambulatory  Vitals:   05/06/24 1009  BP: 131/87  Pulse: 67  Resp: 17  Temp: 98.6 F (37 C)  SpO2: 100%   Filed Weights   05/06/24 1009  Weight: 171 lb 1.6 oz (77.6 kg)    LABORATORY DATA:  I have reviewed the data as listed    Latest Ref Rng & Units 04/29/2024    9:18 AM 03/18/2024   11:59 AM 01/15/2024    9:26 AM  CMP  Glucose 70 - 99 mg/dL 102  725  366   BUN 6 - 20 mg/dL 12  15  18    Creatinine 0.44 - 1.00 mg/dL 4.40  3.47  4.25   Sodium 135 - 145 mmol/L 140  139  139   Potassium 3.5 - 5.1 mmol/L 3.8  3.7  3.8   Chloride 98 - 111 mmol/L 107  107  107   CO2 22 - 32 mmol/L 28  26  26    Calcium  8.9 - 10.3 mg/dL 9.1  8.9  8.5   Total Protein 6.5 - 8.1 g/dL 7.0  6.9  6.4   Total Bilirubin 0.0 - 1.2 mg/dL 0.5  0.3  0.2   Alkaline Phos 38 - 126 U/L 73  78  90   AST 15 - 41 U/L 17  12  11    ALT 0 - 44 U/L 19  14  13      Lab Results  Component Value Date   WBC 4.1 04/29/2024   HGB 12.7 04/29/2024   HCT 37.9 04/29/2024   MCV 85.7 04/29/2024   PLT 203 04/29/2024   NEUTROABS 2.0 04/29/2024    ASSESSMENT & PLAN:  Breast cancer metastasized to liver (HCC) 02/11/19: RT lumpectomy: DICS Intermediate grade, Margins Neg, ER 90%, PR 70%; Lt Lumpectomy: UDH status post radiation, could not tolerate tamoxifen  recurrence: 08/15/2021:MRI  surveillance detected Right breast calcifications 5 cm, Ant biopsy: IG DCIS with necrosis, ER 40%, PR 0%, Posterior biopsy: Intermediate grade DCIS with [facility]  11/29/2021: Right mastectomy: High-grade DCIS with microinvasion, ER 0%, PR 0%, HER2 positive, 0/1 lymph node negative  T1c MIC, N0: Stage Ia   Patient had second opinion at [facility] with Dr. Patra Bonnet who agreed with her plan of no systemic chemotherapy   Breast cancer surveillance: 1.  Mammogram 08/22/2022: Left breast benign breast density category B  2.  CT CAP 12/09/2022: Multiple liver lesions indicative of cysts and hemangiomas new right hepatic lobe lesion favored to be hemangioma otherwise no evidence of metastatic disease.  Radiology recommended an MRI of the liver. 3.  Liver MRI 01/08/2023: 2 New rim-enhancing lesions 2.3 cm (was 1.6 cm on 12/09/2022), 1.8 cm (measured 1.5 cm), benign hemangioma 2.4 cm and 1.9 cm and 1.3 cm unchanged 4.  PET CT scan 01/22/2023: Liver: 2.5 cm and 2.4 cm lesions ------------------------------------------------------------------------------------------------------------------------------------------------- 01/31/2023: Liver lesion biopsy: Metastatic adenocarcinoma to the liver consistent with breast primary positive for CK7 and GATA3, ER 0%, PR 0%, Ki-67 25%, HER2 3+ positive,  [facility] molecular testing 02/21/2023: ER negative PR negative, HER2 amplified, ER positive TMB 4, BRCA negative, ESR 1 unmutated, PD-L1 negative, CPS: 1, PTEN IHC +2+ but mutation not detected   Current treatment: Completed 6 cycles of Taxotere  Herceptin  Perjeta  on 05/30/2023, currently on maintenance Herceptin  and Perjeta  (now being given intravenously because of skin irritation from injections)   Herceptin  Perjeta  toxicities:  Cardiomyopathy: Fatigue and palpitations on Entresto  and metoprolol .  Follows cardiology Meningioma: Follows with Dr. Mark Sil, brain MRI  08/04/2023: Stable, new brain MRI has been ordered   PET/CT: 06/19/2023: Interval resolution of the hypermetabolic lesions in the liver. PET/CT 10/03/2023: No evidence of recurrent/metastatic cancer low-density liver lesions less conspicuous without any hypermetabolic activity 01/08/2024: CT CAP: Stable liver lesions 2.2 cm, 2 cm.  No new lesions.  Stable bilateral pulmonary nodules (considered benign) 02/11/2024: PET/CT scan: No definite evidence of hypermetabolic metastatic disease.  Equivocal small focus of metabolic activity dome of right hepatic lobe corresponding to the MRI finding.  Remains indeterminate (suggest liver MRI in 3 to 6 months), stable lung nodules  MRI 05/05/2024: Results pending: To my review there does not appear to be any worsening disease.   Return to clinic every 3 weeks for Herceptin  and Perjeta  She has plans to go to [place] for 5 weeks or so I will call her with the result of the liver MRI as soon as we get the result.    No orders of the defined types were placed in this encounter.  The patient has a good understanding of the overall plan. she agrees with it. she will call with any problems that may develop before the next visit here. Total time spent: 30 mins including face to face time and time spent for planning, charting and co-ordination of care   Joann K Chukwuemeka Artola, MD 05/06/24

## 2024-05-10 ENCOUNTER — Other Ambulatory Visit: Payer: Self-pay | Admitting: Hematology and Oncology

## 2024-05-10 ENCOUNTER — Telehealth: Payer: Self-pay

## 2024-05-10 ENCOUNTER — Encounter: Payer: Self-pay | Admitting: Hematology and Oncology

## 2024-05-10 ENCOUNTER — Other Ambulatory Visit: Payer: Self-pay

## 2024-05-10 DIAGNOSIS — C50919 Malignant neoplasm of unspecified site of unspecified female breast: Secondary | ICD-10-CM

## 2024-05-10 NOTE — Telephone Encounter (Signed)
 Pt called to ask about next scans. Per Dr Lee Public, pt is in need of CT CAP expected 05/17/24. She has MD f/u scheduled 05/25/24 then she will be travelling to [place]. Message sent to central scheduling and our PA team for assistance with this. Pt is aware and agreeable.

## 2024-05-11 ENCOUNTER — Encounter: Payer: Self-pay | Admitting: Hematology and Oncology

## 2024-05-13 ENCOUNTER — Encounter: Payer: Self-pay | Admitting: Hematology and Oncology

## 2024-05-14 ENCOUNTER — Other Ambulatory Visit (HOSPITAL_COMMUNITY): Payer: Self-pay | Admitting: Family Medicine

## 2024-05-17 ENCOUNTER — Encounter (HOSPITAL_COMMUNITY)
Admission: RE | Admit: 2024-05-17 | Discharge: 2024-05-17 | Disposition: A | Discharge status: Home / Self Care | Payer: Self-pay | Source: Ambulatory Visit | Attending: Hematology and Oncology | Admitting: Hematology and Oncology

## 2024-05-17 ENCOUNTER — Encounter: Payer: Self-pay | Admitting: Hematology and Oncology

## 2024-05-17 DIAGNOSIS — C787 Secondary malignant neoplasm of liver and intrahepatic bile duct: Secondary | ICD-10-CM | POA: Insufficient documentation

## 2024-05-17 DIAGNOSIS — N281 Cyst of kidney, acquired: Secondary | ICD-10-CM | POA: Diagnosis not present

## 2024-05-17 DIAGNOSIS — C50919 Malignant neoplasm of unspecified site of unspecified female breast: Secondary | ICD-10-CM | POA: Insufficient documentation

## 2024-05-17 DIAGNOSIS — D259 Leiomyoma of uterus, unspecified: Secondary | ICD-10-CM | POA: Diagnosis not present

## 2024-05-17 DIAGNOSIS — D1803 Hemangioma of intra-abdominal structures: Secondary | ICD-10-CM | POA: Diagnosis not present

## 2024-05-17 DIAGNOSIS — K7689 Other specified diseases of liver: Secondary | ICD-10-CM | POA: Diagnosis not present

## 2024-05-17 MED ORDER — SODIUM CHLORIDE (PF) 0.9 % IJ SOLN
INTRAMUSCULAR | Status: AC
  Filled 2024-05-17: qty 50

## 2024-05-17 MED ORDER — IOHEXOL 300 MG/ML  SOLN
100.0000 mL | Freq: Once | INTRAMUSCULAR | Status: AC | PRN
  Administered 2024-05-17: 100 mL via INTRAVENOUS

## 2024-05-19 ENCOUNTER — Other Ambulatory Visit

## 2024-05-20 ENCOUNTER — Ambulatory Visit

## 2024-05-20 ENCOUNTER — Other Ambulatory Visit: Payer: Self-pay | Admitting: Hematology and Oncology

## 2024-05-20 ENCOUNTER — Ambulatory Visit: Admitting: Hematology and Oncology

## 2024-05-20 ENCOUNTER — Other Ambulatory Visit

## 2024-05-21 ENCOUNTER — Ambulatory Visit (HOSPITAL_COMMUNITY)
Admission: RE | Admit: 2024-05-21 | Discharge: 2024-05-21 | Disposition: A | Discharge status: Home / Self Care | Source: Ambulatory Visit | Attending: Internal Medicine | Admitting: Internal Medicine

## 2024-05-21 ENCOUNTER — Encounter: Payer: Self-pay | Admitting: Hematology and Oncology

## 2024-05-21 ENCOUNTER — Ambulatory Visit (HOSPITAL_BASED_OUTPATIENT_CLINIC_OR_DEPARTMENT_OTHER)
Admission: RE | Admit: 2024-05-21 | Discharge: 2024-05-21 | Disposition: A | Discharge status: Home / Self Care | Source: Ambulatory Visit | Attending: Internal Medicine | Admitting: Internal Medicine

## 2024-05-21 VITALS — BP 140/92 | HR 66 | Wt 172.2 lb

## 2024-05-21 DIAGNOSIS — R5383 Other fatigue: Secondary | ICD-10-CM | POA: Insufficient documentation

## 2024-05-21 DIAGNOSIS — I371 Nonrheumatic pulmonary valve insufficiency: Secondary | ICD-10-CM | POA: Diagnosis not present

## 2024-05-21 DIAGNOSIS — R0683 Snoring: Secondary | ICD-10-CM | POA: Diagnosis not present

## 2024-05-21 DIAGNOSIS — C50919 Malignant neoplasm of unspecified site of unspecified female breast: Secondary | ICD-10-CM | POA: Diagnosis not present

## 2024-05-21 DIAGNOSIS — Z87891 Personal history of nicotine dependence: Secondary | ICD-10-CM | POA: Diagnosis not present

## 2024-05-21 DIAGNOSIS — I10 Essential (primary) hypertension: Secondary | ICD-10-CM

## 2024-05-21 DIAGNOSIS — Z79632 Long term (current) use of antitumor antibiotic: Secondary | ICD-10-CM | POA: Insufficient documentation

## 2024-05-21 DIAGNOSIS — T451X5A Adverse effect of antineoplastic and immunosuppressive drugs, initial encounter: Secondary | ICD-10-CM | POA: Diagnosis not present

## 2024-05-21 DIAGNOSIS — C787 Secondary malignant neoplasm of liver and intrahepatic bile duct: Secondary | ICD-10-CM

## 2024-05-21 DIAGNOSIS — Z79899 Other long term (current) drug therapy: Secondary | ICD-10-CM | POA: Insufficient documentation

## 2024-05-21 DIAGNOSIS — I427 Cardiomyopathy due to drug and external agent: Secondary | ICD-10-CM

## 2024-05-21 DIAGNOSIS — I429 Cardiomyopathy, unspecified: Secondary | ICD-10-CM | POA: Insufficient documentation

## 2024-05-21 DIAGNOSIS — Z853 Personal history of malignant neoplasm of breast: Secondary | ICD-10-CM | POA: Diagnosis not present

## 2024-05-21 DIAGNOSIS — R0609 Other forms of dyspnea: Secondary | ICD-10-CM | POA: Insufficient documentation

## 2024-05-21 LAB — ECHOCARDIOGRAM COMPLETE
AR max vel: 2.38 cm2
AV Area VTI: 2.49 cm2
AV Area mean vel: 2.19 cm2
AV Mean grad: 4 mmHg
AV Peak grad: 7 mmHg
Ao pk vel: 1.32 m/s
Area-P 1/2: 4.93 cm2
Calc EF: 47.1 %
MV VTI: 2.15 cm2
S' Lateral: 3.5 cm
Single Plane A2C EF: 50.6 %
Single Plane A4C EF: 45.1 %

## 2024-05-21 MED ORDER — ENTRESTO 97-103 MG PO TABS: 1.0000 | ORAL_TABLET | Freq: Two times a day (BID) | ORAL | 11 refills | Status: DC

## 2024-05-21 NOTE — Addendum Note (Signed)
 Encounter addended by: Alvan Fausto SAUNDERS, CMA on: 05/21/2024 10:32 AM  Actions taken: Order list changed, Clinical Note Signed

## 2024-05-21 NOTE — Progress Notes (Signed)
*  PRELIMINARY RESULTS* Echocardiogram 2D Echocardiogram has been performed.  Bari BROCKS Vadhir Mcnay 05/21/2024, 9:44 AM

## 2024-05-21 NOTE — Progress Notes (Signed)
 CARDIO-ONCOLOGY CLINIC NOTE  Referring Physician: Primary Care: Primary Cardiologist:  HPI:  Ms. Joann Meadows is 48 y.o. female with breast cancer referred by Dr. Odean for enrollment into the Cardio-Oncology program.  Treated for AML 09/1998 treated with 2 induction regimens (including doxrubicin) followed by 6 consolidation regimens, remission   Diagnosed with R breast CA DCIS in 3/20 ER/PR+  Underwent lumpectomy Treated with tamoxifen  and XRT  Relapse in 9/22   9/23 found to have Stage IV CA in 3/24 started DOCEtaxel  + Trastuzumab  + Pertuzumab  (THP) q21d x 8 cycles / Trastuzumab  + Pertuzumab  q21d x 4 cycles   PET/CT: 06/19/2023: Interval resolution of the hypermetabolic lesions in the liver. PET/CT 10/03/2023: No evidence of recurrent/metastatic cancer low-density liver lesions less conspicuous without any hypermetabolic activity   ECHO:  7/24 EF 55-60% GLS -18.0  ECHO 11/24 50-55% GLS -13.4 (poor tracking)  POCUS ECHO 11/21/23 EF 50-55%  Here for f/u. Remains on herceptin /perjeta . Exercises on [place] 5 miles 3-4x/week. Some fatigue at times.   Echo  01/30/24 EF 55-60% GLS -19.2%   Echo today EF 50-55% GLS -14.1%   Past Medical History:  Diagnosis Date   Breast cancer (HCC) 2020   Right Breast Cancer   Endometriosis    Fibroid    Hx of radiation therapy 06/2019   Leukemia (HCC)    20 years ago   Personal history of radiation therapy 2020   Right Breast Cancer   PONV (postoperative nausea and vomiting)    pt states she vomits after colonoscopies    Current Outpatient Medications  Medication Sig Dispense Refill   acetaminophen  (TYLENOL ) 325 MG tablet Take 650 mg by mouth every 6 (six) hours as needed.     ALPRAZolam (XANAX) 0.25 MG tablet      cholecalciferol (VITAMIN D3) 25 MCG (1000 UNIT) tablet Take 2,000 Units by mouth daily.     fluconazole  (DIFLUCAN ) 100 MG tablet Take 1 tablet (100 mg total) by mouth daily. 15 tablet 1   fluticasone (FLONASE) 50 MCG/ACT nasal  spray Place 1 spray into both nostrils daily.     Ibuprofen  (ADVIL ) 200 MG CAPS Take 1 capsule by mouth as needed.     loratadine (CLARITIN) 10 MG tablet 1 tablet Orally Once a day     metroNIDAZOLE (FLAGYL) 500 MG tablet Take 500 mg by mouth 2 (two) times daily.     phenylephrine  (SUDAFED PE) 10 MG TABS tablet Take 10 mg by mouth as needed.     sacubitril -valsartan  (ENTRESTO ) 49-51 MG Take 1 tablet by mouth 2 (two) times daily. 60 tablet 11   Zinc-Vitamin C (ZINC-A-COLD/VITAMIN C MT) Use as directed 1 capsule in the mouth or throat daily.     No current facility-administered medications for this encounter.    Allergies  Allergen Reactions   Levaquin  [Levofloxacin ] Swelling    Swelling feet and hands   Tamoxifen  Citrate     Other reaction(s): depression, joint pain   Tape Rash      Social History   Socioeconomic History   Marital status: Single    Spouse name: Not on file   Number of children: 2   Years of education: Not on file   Highest education level: High school graduate  Occupational History   Not on file  Tobacco Use   Smoking status: Former    Current packs/day: 0.00    Types: Cigarettes    Start date: 01/24/2005    Quit date: 01/25/2015    Years since quitting:  9.3   Smokeless tobacco: Never  Vaping Use   Vaping status: Never Used  Substance and Sexual Activity   Alcohol use: No   Drug use: No   Sexual activity: Yes    Birth control/protection: None  Other Topics Concern   Not on file  Social History Narrative   Lives with children   Social Drivers of Health   Financial Resource Strain: Not on file  Food Insecurity: No Food Insecurity (10/03/2022)   Hunger Vital Sign    Worried About Running Out of Food in the Last Year: Never true    Ran Out of Food in the Last Year: Never true  Transportation Needs: No Transportation Needs (10/03/2022)   PRAPARE - Administrator, Civil Service (Medical): No    Lack of Transportation (Non-Medical): No   Physical Activity: Not on file  Stress: Not on file  Social Connections: Not on file  Intimate Partner Violence: Not At Risk (02/26/2019)   Humiliation, Afraid, Rape, and Kick questionnaire    Fear of Current or Ex-Partner: No    Emotionally Abused: No    Physically Abused: No    Sexually Abused: No      Family History  Problem Relation Age of Onset   Colon cancer Mother 56       d. 96   Hypertension Father    Diabetes Maternal Aunt    Diabetes Maternal Uncle    Mental retardation Maternal Uncle    Diabetes Paternal Aunt    Diabetes Maternal Grandmother    Stroke Paternal Grandmother        c. 64   Leukemia Paternal Grandfather        d. 64; ?CLL   Leukemia Cousin 4       mat first cousin   Lymphoma Cousin 40       pat first cousin    Vitals:   05/21/24 1002  BP: (!) 140/92  Pulse: 66  SpO2: 100%  Weight: 78.1 kg (172 lb 3.2 oz)     PHYSICAL EXAM: General:  Well appearing. No resp difficulty HEENT: normal Neck: supple. no JVD. Carotids 2+ bilat; no bruits. No lymphadenopathy or thryomegaly appreciated. Cor: PMI nondisplaced. Regular rate & rhythm. No rubs, gallops or murmurs. Lungs: clear Abdomen: soft, nontender, nondistended. No hepatosplenomegaly. No bruits or masses. Good bowel sounds. Extremities: no cyanosis, clubbing, rash, edema Neuro: alert & orientedx3, cranial nerves grossly intact. moves all 4 extremities w/o difficulty. Affect pleasant    ASSESSMENT & PLAN:  1. Right Breast Cancer - stage IV - Treated for AML 09/1998 treated with 2 induction regimens (including doxrubicin) followed by 6 consolidation regimens, remission  - Diagnosed with R breast CA DCIS in 3/20 ER/PR+  Underwent lumpectomy Treated with tamoxifen  and XRT - Relapse in 9/22  - 9/23 found to have Stage IV CA in 3/24 started DOCEtaxel  + Trastuzumab  + Pertuzumab  (THP) q21d x 8 cycles / Trastuzumab  + Pertuzumab  q21d x 4 cycles  - remains on Herceptin /Perjeta   2. Potential  chemotherapy-induced cardiomyopathy - LV function low end of normal range - ECHO:  7/24 EF 55-60% GLS -18.0  - ECHO 10/01/23 50-55% GLS -13.4 (poor tracking) - POCUS echo t12/27/24 in clinic EF stable, 50-55%  - Echo 01/30/24 EF 55-60% GLS -19.2%  - Echo today EF 50-55% GLS -14.1% Personally reviewed - Doing well NYHA I.  - Increase Entresto  to 97/103 bid - Off Toprol  due to fatigue - EF and strain remain on low end  of normal but will continue H/P with watchful waiting  - Continue Herceptin /Perjeta . Continue to follow echos every 3 months  3. Hypertension  - SBPs running high - Increase Entresto   4. Fatigue/snoring  - sleep study completed but no results in computer. Will check on this  5. Screening for CAD - Cardiac CT 04/26/24. CAC 0  Toribio Fuel, MD  10:15 AM

## 2024-05-21 NOTE — Patient Instructions (Signed)
 No Labs done today.   INCREASE Entresto  97-103mg  (1 tablet) by mouth 2 times daily.   No other medication changes were made. Please continue all current medications as prescribed.  Your physician recommends that you schedule a follow-up appointment in: 3 months with an echo prior to your appointment with Dr. Bensimhon.   Your physician has requested that you have an echocardiogram. Echocardiography is a painless test that uses sound waves to create images of your heart. It provides your doctor with information about the size and shape of your heart and how well your heart's chambers and valves are working. This procedure takes approximately one hour. There are no restrictions for this procedure. Please do NOT wear cologne, perfume, aftershave, or lotions (deodorant is allowed). Please arrive 15 minutes prior to your appointment time.  Please note: We ask at that you not bring children with you during ultrasound (echo/ vascular) testing. Due to room size and safety concerns, children are not allowed in the ultrasound rooms during exams. Our front office staff cannot provide observation of children in our lobby area while testing is being conducted. An adult accompanying a patient to their appointment will only be allowed in the ultrasound room at the discretion of the ultrasound technician under special circumstances. We apologize for any inconvenience.  If you have any questions or concerns before your next appointment please send us  a message through [place] or call our office at [phone].    TO LEAVE A MESSAGE FOR THE NURSE SELECT OPTION 2, PLEASE LEAVE A MESSAGE INCLUDING: YOUR NAME DATE OF BIRTH CALL BACK NUMBER REASON FOR CALL**this is important as we prioritize the call backs  YOU WILL RECEIVE A CALL BACK THE SAME DAY AS LONG AS YOU CALL BEFORE 4:00 PM   Do the following things EVERYDAY: Weigh yourself in the morning before breakfast. Write it down and keep it in a log. Take your  medicines as prescribed Eat low salt foods--Limit salt (sodium) to 2000 mg per day.  Stay as active as you can everyday Limit all fluids for the day to less than 2 liters   At the [facility], you and your health needs are our priority. As part of our continuing mission to provide you with exceptional heart care, we have created designated Provider Care Teams. These Care Teams include your primary Cardiologist (physician) and Advanced Practice Providers (APPs- Physician Assistants and Nurse Practitioners) who all work together to provide you with the care you need, when you need it.   You may see any of the following providers on your designated Care Team at your next follow up: Dr Toribio Fuel Dr Ezra Shuck Dr. Ria Gardenia Greig Lenetta, NP Caffie Shed, [place] [facility] [place], [place] Beckey Coe, NP Tinnie Redman, PharmD   Please be sure to bring in all your medications bottles to every appointment.    Thank you for choosing [facility]

## 2024-05-24 ENCOUNTER — Telehealth (HOSPITAL_COMMUNITY): Payer: Self-pay

## 2024-05-24 DIAGNOSIS — E7800 Pure hypercholesterolemia, unspecified: Secondary | ICD-10-CM | POA: Diagnosis not present

## 2024-05-24 NOTE — Assessment & Plan Note (Signed)
 02/11/19: RT lumpectomy: DICS Intermediate grade, Margins Neg, ER 90%, PR 70%; Lt Lumpectomy: UDH status post radiation, could not tolerate tamoxifen  recurrence: 08/15/2021:MRI surveillance detected Right breast calcifications 5 cm, Ant biopsy: IG DCIS with necrosis, ER 40%, PR 0%, Posterior biopsy: Intermediate grade DCIS with [facility]  11/29/2021: Right mastectomy: High-grade DCIS with microinvasion, ER 0%, PR 0%, HER2 positive, 0/1 lymph node negative  T1c MIC, N0: Stage Ia   Patient had second opinion at [facility] with Dr. Eleanora who agreed with her plan of no systemic chemotherapy   Breast cancer surveillance: 1.  Mammogram 08/22/2022: Left breast benign breast density category B  2.  CT CAP 12/09/2022: Multiple liver lesions indicative of cysts and hemangiomas new right hepatic lobe lesion favored to be hemangioma otherwise no evidence of metastatic disease.  Radiology recommended an MRI of the liver. 3.  Liver MRI 01/08/2023: 2 New rim-enhancing lesions 2.3 cm (was 1.6 cm on 12/09/2022), 1.8 cm (measured 1.5 cm), benign hemangioma 2.4 cm and 1.9 cm and 1.3 cm unchanged 4.  PET CT scan 01/22/2023: Liver: 2.5 cm and 2.4 cm lesions ------------------------------------------------------------------------------------------------------------------------------------------------- 01/31/2023: Liver lesion biopsy: Metastatic adenocarcinoma to the liver consistent with breast primary positive for CK7 and GATA3, ER 0%, PR 0%, Ki-67 25%, HER2 3+ positive, [facility] molecular testing 02/21/2023: ER negative PR negative, HER2 amplified, ER positive TMB 4, BRCA negative, ESR 1 unmutated, PD-L1 negative, CPS: 1, PTEN IHC +2+ but mutation not detected   Current treatment: Completed 6 cycles of Taxotere  Herceptin  Perjeta  on 05/30/2023, currently on maintenance Herceptin  and Perjeta  (now being given intravenously because of skin irritation from injections)   Herceptin  Perjeta  toxicities:  Cardiomyopathy: Fatigue and palpitations on  Entresto  and metoprolol .  Follows cardiology Meningioma: Follows with Dr. Buckley, brain MRI  08/04/2023: Stable, new brain MRI has been ordered   PET/CT: 06/19/2023: Interval resolution of the hypermetabolic lesions in the liver. PET/CT 10/03/2023: No evidence of recurrent/metastatic cancer low-density liver lesions less conspicuous without any hypermetabolic activity 01/08/2024: CT CAP: Stable liver lesions 2.2 cm, 2 cm.  No new lesions.  Stable bilateral pulmonary nodules (considered benign) 02/11/2024: PET/CT scan: No definite evidence of hypermetabolic metastatic disease.  Equivocal small focus of metabolic activity dome of right hepatic lobe corresponding to the MRI finding.  Remains indeterminate (suggest liver MRI in 3 to 6 months), stable lung nodules   MRI 05/05/2024: stable small hypervascular liver lesions CT CAP 05/17/24: Small lung nodules unchanged, benign liver cysts, no lymphadenopathy or met disease   Return to clinic every 3 weeks for Herceptin  and Perjeta  She has plans to go to [place] for 5 weeks or so

## 2024-05-25 ENCOUNTER — Inpatient Hospital Stay (HOSPITAL_BASED_OUTPATIENT_CLINIC_OR_DEPARTMENT_OTHER): Payer: Self-pay | Admitting: Hematology and Oncology

## 2024-05-25 ENCOUNTER — Inpatient Hospital Stay: Payer: Self-pay

## 2024-05-25 ENCOUNTER — Inpatient Hospital Stay: Payer: Self-pay | Attending: Hematology and Oncology

## 2024-05-25 VITALS — BP 112/78 | HR 69 | Temp 98.3°F | Resp 18 | Wt 169.4 lb

## 2024-05-25 VITALS — BP 138/78 | HR 66 | Temp 98.2°F | Resp 18 | Wt 172.2 lb

## 2024-05-25 DIAGNOSIS — Z79899 Other long term (current) drug therapy: Secondary | ICD-10-CM | POA: Diagnosis not present

## 2024-05-25 DIAGNOSIS — D0511 Intraductal carcinoma in situ of right breast: Secondary | ICD-10-CM | POA: Diagnosis not present

## 2024-05-25 DIAGNOSIS — C50919 Malignant neoplasm of unspecified site of unspecified female breast: Secondary | ICD-10-CM | POA: Diagnosis not present

## 2024-05-25 DIAGNOSIS — Z923 Personal history of irradiation: Secondary | ICD-10-CM | POA: Diagnosis not present

## 2024-05-25 DIAGNOSIS — Z170 Estrogen receptor positive status [ER+]: Secondary | ICD-10-CM | POA: Insufficient documentation

## 2024-05-25 DIAGNOSIS — Z95828 Presence of other vascular implants and grafts: Secondary | ICD-10-CM

## 2024-05-25 DIAGNOSIS — C787 Secondary malignant neoplasm of liver and intrahepatic bile duct: Secondary | ICD-10-CM

## 2024-05-25 DIAGNOSIS — Z9011 Acquired absence of right breast and nipple: Secondary | ICD-10-CM | POA: Insufficient documentation

## 2024-05-25 DIAGNOSIS — Z5112 Encounter for antineoplastic immunotherapy: Secondary | ICD-10-CM | POA: Diagnosis not present

## 2024-05-25 DIAGNOSIS — K769 Liver disease, unspecified: Secondary | ICD-10-CM

## 2024-05-25 LAB — CMP (CANCER CENTER ONLY)
ALT: 17 U/L (ref 0–44)
AST: 14 U/L — ABNORMAL LOW (ref 15–41)
Albumin: 4.1 g/dL (ref 3.5–5.0)
Alkaline Phosphatase: 90 U/L (ref 38–126)
Anion gap: 6 (ref 5–15)
BUN: 19 mg/dL (ref 6–20)
CO2: 26 mmol/L (ref 22–32)
Calcium: 8.7 mg/dL — ABNORMAL LOW (ref 8.9–10.3)
Chloride: 106 mmol/L (ref 98–111)
Creatinine: 0.7 mg/dL (ref 0.44–1.00)
GFR, Estimated: >60 mL/min (ref >60)
Glucose, Bld: 103 mg/dL — ABNORMAL HIGH (ref 70–99)
Potassium: 3.7 mmol/L (ref 3.5–5.1)
Sodium: 138 mmol/L (ref 135–145)
Total Bilirubin: 0.4 mg/dL (ref 0.0–1.2)
Total Protein: 6.6 g/dL (ref 6.5–8.1)

## 2024-05-25 LAB — CBC WITH DIFFERENTIAL (CANCER CENTER ONLY)
Abs Immature Granulocytes: 0.01 10*3/uL (ref 0.00–0.07)
Basophils Absolute: 0.1 10*3/uL (ref 0.0–0.1)
Basophils Relative: 1 %
Eosinophils Absolute: 0.1 10*3/uL (ref 0.0–0.5)
Eosinophils Relative: 3 %
HCT: 38.6 % (ref 36.0–46.0)
Hemoglobin: 12.9 g/dL (ref 12.0–15.0)
Immature Granulocytes: 0 %
Lymphocytes Relative: 37 %
Lymphs Abs: 1.9 10*3/uL (ref 0.7–4.0)
MCH: 29 pg (ref 26.0–34.0)
MCHC: 33.4 g/dL (ref 30.0–36.0)
MCV: 86.7 fL (ref 80.0–100.0)
Monocytes Absolute: 0.5 10*3/uL (ref 0.1–1.0)
Monocytes Relative: 10 %
Neutro Abs: 2.6 10*3/uL (ref 1.7–7.7)
Neutrophils Relative %: 49 %
Platelet Count: 210 10*3/uL (ref 150–400)
RBC: 4.45 MIL/uL (ref 3.87–5.11)
RDW: 13.5 % (ref 11.5–15.5)
WBC Count: 5.1 10*3/uL (ref 4.0–10.5)
nRBC: 0 % (ref 0.0–0.2)

## 2024-05-25 MED ORDER — DIPHENHYDRAMINE HCL 25 MG PO CAPS
50.0000 mg | ORAL_CAPSULE | Freq: Once | ORAL | Status: AC
  Administered 2024-05-25: 50 mg via ORAL
  Filled 2024-05-25: qty 2

## 2024-05-25 MED ORDER — SODIUM CHLORIDE 0.9% FLUSH
10.0000 mL | Freq: Once | INTRAVENOUS | Status: AC
  Administered 2024-05-25: 10 mL

## 2024-05-25 MED ORDER — ACETAMINOPHEN 325 MG PO TABS
650.0000 mg | ORAL_TABLET | Freq: Once | ORAL | Status: AC
  Administered 2024-05-25: 650 mg via ORAL
  Filled 2024-05-25: qty 2

## 2024-05-25 MED ORDER — SODIUM CHLORIDE 0.9 % IV SOLN: INTRAVENOUS | Status: DC

## 2024-05-25 MED ORDER — TRASTUZUMAB-ANNS CHEMO 150 MG IV SOLR
6.0000 mg/kg | Freq: Once | INTRAVENOUS | Status: AC
  Administered 2024-05-25: 483 mg via INTRAVENOUS
  Filled 2024-05-25: qty 23

## 2024-05-25 MED ORDER — SODIUM CHLORIDE 0.9 % IV SOLN
420.0000 mg | Freq: Once | INTRAVENOUS | Status: AC
  Administered 2024-05-25: 420 mg via INTRAVENOUS
  Filled 2024-05-25: qty 14

## 2024-05-25 NOTE — Progress Notes (Signed)
 Patient Care Team: Regino Slater, MD as PCP - General (Family Medicine) [name], RN as Oncology Nurse Navigator Glean, Stephane BROCKS, RN (Inactive) as Oncology Nurse Navigator [name], MD as Consulting Physician (Hematology and Oncology) Default, Provider, MD as Technician  DIAGNOSIS:  Encounter Diagnosis  Name Primary?   Carcinoma of breast metastatic to liver, unspecified laterality (HCC) Yes    SUMMARY OF ONCOLOGIC HISTORY: Oncology History  Breast cancer metastasized to liver (HCC)  01/25/2019 Initial Diagnosis   Screening mammogram showed bilateral calcifications right breast 5 mm, pleomorphic.  Left breast 1.6 cm punctate, smaller group of calcifications spanning 3 mm.  Right lower outer quadrant biopsy revealed low-grade DCIS ER 90%, PR 70%; biopsy of left breast calcifications ALH and PASH   01/31/2019 Genetic Testing   ATM c.8495G>A and RECQL4 c.2587G>A VUS identified on the 9-gene STAT panel and Multi-cancer panel through [facility].  The STAT Breast cancer panel offered by [facility] includes sequencing and rearrangement analysis for the following 9 genes:  ATM, BRCA1, BRCA2, CDH1, CHEK2, PALB2, PTEN, STK11 and TP53.   The Multi-Gene Panel offered by [facility] includes sequencing and/or deletion duplication testing of the following 84 genes: AIP, ALK, APC, ATM, AXIN2,BAP1,  BARD1, BLM, BMPR1A, BRCA1, BRCA2, BRIP1, CASR, CDC73, CDH1, CDK4, CDKN1B, CDKN1C, CDKN2A (p14ARF), CDKN2A (p16INK4a), CEBPA, CHEK2, CTNNA1, DICER1, DIS3L2, EGFR (c.2369C>T, p.Thr790Met variant only), EPCAM (Deletion/duplication testing only), FH, FLCN, GATA2, GPC3, GREM1 (Promoter region deletion/duplication testing only), HOXB13 (c.251G>A, p.Gly84Glu), HRAS, KIT, MAX, MEN1, MET, MITF (c.952G>A, p.Glu318Lys variant only), MLH1, MSH2, MSH3, MSH6, MUTYH, NBN, NF1, NF2, NTHL1, PALB2, PDGFRA, PHOX2B, PMS2, POLD1, POLE, POT1, PRKAR1A, PTCH1, PTEN, RAD50, RAD51C, RAD51D, RB1, RECQL4, RET, RUNX1, SDHAF2, SDHA (sequence  changes only), SDHB, SDHC, SDHD, SMAD4, SMARCA4, SMARCB1, SMARCE1, STK11, SUFU, TERC, TERT, TMEM127, TP53, TSC1, TSC2, VHL, WRN and WT1.  he report date is February 02, 2019.   02/11/2019 Surgery   RT lumpectomy: DICS Intermediate grade, Margins Neg, ER 90%, PR 70%; Lt Lumpectomy: UDH   02/26/2019 -  Anti-estrogen oral therapy   Tamoxifen  20mg  daily, stopped because of depression and facial numbness   05/20/2019 - 06/17/2019 Radiation Therapy   Adjuvant XRT   08/15/2021 Relapse/Recurrence   MRI surveillance detected Right breast calcifications 5 cm, Ant biopsy: IG DCIS with necrosis, ER 40%, PR 0%, Posterior biopsy: Intermediate grade DCIS with [facility]   08/19/2022 Cancer Staging   Staging form: Breast, AJCC 8th Edition - Clinical: Stage IV (cT32mi, cNX, cM1, G3, ER: Unknown, PR: Unknown, HER2: Unknown) - Signed by [name], MD on 02/06/2023 Histologic grading system: 3 grade system   02/14/2023 - 01/15/2024 Chemotherapy   Patient is on Treatment Plan : BREAST DOCEtaxel  + Trastuzumab  + Pertuzumab  (THP) q21d x 8 cycles / Trastuzumab  + Pertuzumab  q21d x 4 cycles     02/05/2024 -  Chemotherapy   Patient is on Treatment Plan : BREAST Trastuzumab   + Pertuzumab  q21d x 13 cycles     AML (acute myeloid leukemia) (HCC)  09/1998 Initial Diagnosis   AML (acute myeloid leukemia) (HCC) treated with 2 induction regimens followed by 6 consolidation regimens, remission     CHIEF COMPLIANT:   HISTORY OF PRESENT ILLNESS:   History of Present Illness Joann Meadows is a 48 year old female with breast cancer who presents for a follow-up visit regarding her treatment and cardiac health.  She is undergoing treatment with Herceptin  for breast cancer. Her recent echocardiogram shows an ejection fraction of 50-55%, prompting a four-week break in her treatment  to support cardiac health.  A recent CT scan shows no evidence of lymph nodes or metastatic disease. Small bilateral unchanged cysts, hemangiomas, and  fibroids are noted. An MRI suggests nodular hyperplasia of the liver.  Her cholesterol levels are elevated, with a total cholesterol of nearly 300 mg/dL and an LDL of 843 mg/dL. HDL is high at 184 mg/dL. Despite a cardiac scoring of zero, there is consideration for cholesterol medication due to high LDL levels.  She is prediabetic with a hemoglobin A1c in the 6% range and fasting glucose levels around 101 mg/dL. Regular exercise and recent weight loss are noted.     ALLERGIES:  is allergic to levaquin  [levofloxacin ], tamoxifen  citrate, and tape.  MEDICATIONS:  Current Outpatient Medications  Medication Sig Dispense Refill   acetaminophen  (TYLENOL ) 325 MG tablet Take 650 mg by mouth every 6 (six) hours as needed.     ALPRAZolam (XANAX) 0.25 MG tablet      cholecalciferol (VITAMIN D3) 25 MCG (1000 UNIT) tablet Take 2,000 Units by mouth daily.     fluconazole  (DIFLUCAN ) 100 MG tablet Take 1 tablet (100 mg total) by mouth daily. 15 tablet 1   fluticasone (FLONASE) 50 MCG/ACT nasal spray Place 1 spray into both nostrils daily.     Ibuprofen  (ADVIL ) 200 MG CAPS Take 1 capsule by mouth as needed.     loratadine (CLARITIN) 10 MG tablet 1 tablet Orally Once a day     metroNIDAZOLE (FLAGYL) 500 MG tablet Take 500 mg by mouth 2 (two) times daily.     phenylephrine  (SUDAFED PE) 10 MG TABS tablet Take 10 mg by mouth as needed.     potassium chloride  SA (KLOR-CON  M) 20 MEQ tablet TAKE 1 TABLET BY MOUTH EVERY DAY 90 tablet 1   sacubitril -valsartan  (ENTRESTO ) 97-103 MG Take 1 tablet by mouth 2 (two) times daily. 60 tablet 11   Zinc-Vitamin C (ZINC-A-COLD/VITAMIN C MT) Use as directed 1 capsule in the mouth or throat daily.     No current facility-administered medications for this visit.   Facility-Administered Medications Ordered in Other Visits  Medication Dose Route Frequency Provider Last Rate Last Admin   0.9 %  sodium chloride  infusion   Intravenous Continuous Pattie Flaharty, MD        acetaminophen  (TYLENOL ) tablet 650 mg  650 mg Oral Once Montey Ebel, MD       diphenhydrAMINE  (BENADRYL ) capsule 50 mg  50 mg Oral Once Oddie Bottger, MD       pertuzumab  (PERJETA ) 420 mg in sodium chloride  0.9 % 250 mL chemo infusion  420 mg Intravenous Once Noralyn Karim, MD       trastuzumab -anns (KANJINTI ) 483 mg in sodium chloride  0.9 % 250 mL chemo infusion  6 mg/kg (Treatment Plan Recorded) Intravenous Once Arloa Prak, MD        PHYSICAL EXAMINATION: ECOG PERFORMANCE STATUS: 1 - Symptomatic but completely ambulatory  There were no vitals filed for this visit. There were no vitals filed for this visit.    LABORATORY DATA:  I have reviewed the data as listed    Latest Ref Rng & Units 04/29/2024    9:18 AM 03/18/2024   11:59 AM 01/15/2024    9:26 AM  CMP  Glucose 70 - 99 mg/dL 898  875  897   BUN 6 - 20 mg/dL 12  15  18    Creatinine 0.44 - 1.00 mg/dL 9.29  9.36  9.27   Sodium 135 - 145 mmol/L 140  139  139   Potassium 3.5 - 5.1 mmol/L 3.8  3.7  3.8   Chloride 98 - 111 mmol/L 107  107  107   CO2 22 - 32 mmol/L 28  26  26    Calcium 8.9 - 10.3 mg/dL 9.1  8.9  8.5   Total Protein 6.5 - 8.1 g/dL 7.0  6.9  6.4   Total Bilirubin 0.0 - 1.2 mg/dL 0.5  0.3  0.2   Alkaline Phos 38 - 126 U/L 73  78  90   AST 15 - 41 U/L 17  12  11    ALT 0 - 44 U/L 19  14  13      Lab Results  Component Value Date   WBC 5.1 05/25/2024   HGB 12.9 05/25/2024   HCT 38.6 05/25/2024   MCV 86.7 05/25/2024   PLT 210 05/25/2024   NEUTROABS 2.6 05/25/2024    ASSESSMENT & PLAN:  Breast cancer metastasized to liver (HCC) 02/11/19: RT lumpectomy: DICS Intermediate grade, Margins Neg, ER 90%, PR 70%; Lt Lumpectomy: UDH status post radiation, could not tolerate tamoxifen  recurrence: 08/15/2021:MRI surveillance detected Right breast calcifications 5 cm, Ant biopsy: IG DCIS with necrosis, ER 40%, PR 0%, Posterior biopsy: Intermediate grade DCIS with [facility]  11/29/2021: Right mastectomy: High-grade DCIS with  microinvasion, ER 0%, PR 0%, HER2 positive, 0/1 lymph node negative  T1c MIC, N0: Stage Ia   Patient had second opinion at [facility] with Dr. Eleanora who agreed with her plan of no systemic chemotherapy   Breast cancer surveillance: 1.  Mammogram 08/22/2022: Left breast benign breast density category B  2.  CT CAP 12/09/2022: Multiple liver lesions indicative of cysts and hemangiomas new right hepatic lobe lesion favored to be hemangioma otherwise no evidence of metastatic disease.  Radiology recommended an MRI of the liver. 3.  Liver MRI 01/08/2023: 2 New rim-enhancing lesions 2.3 cm (was 1.6 cm on 12/09/2022), 1.8 cm (measured 1.5 cm), benign hemangioma 2.4 cm and 1.9 cm and 1.3 cm unchanged 4.  PET CT scan 01/22/2023: Liver: 2.5 cm and 2.4 cm lesions ------------------------------------------------------------------------------------------------------------------------------------------------- 01/31/2023: Liver lesion biopsy: Metastatic adenocarcinoma to the liver consistent with breast primary positive for CK7 and GATA3, ER 0%, PR 0%, Ki-67 25%, HER2 3+ positive, [facility] molecular testing 02/21/2023: ER negative PR negative, HER2 amplified, ER positive TMB 4, BRCA negative, ESR 1 unmutated, PD-L1 negative, CPS: 1, PTEN IHC +2+ but mutation not detected   Current treatment: Completed 6 cycles of Taxotere  Herceptin  Perjeta  on 05/30/2023, currently on maintenance Herceptin  and Perjeta  (now being given intravenously because of skin irritation from injections)   Herceptin  Perjeta  toxicities:  Cardiomyopathy: Fatigue and palpitations on Entresto  and metoprolol .  Follows cardiology Meningioma: Follows with Dr. Buckley, brain MRI  08/04/2023: Stable, new brain MRI has been ordered   PET/CT: 06/19/2023: Interval resolution of the hypermetabolic lesions in the liver. PET/CT 10/03/2023: No evidence of recurrent/metastatic cancer low-density liver lesions less conspicuous without any hypermetabolic activity 01/08/2024: CT  CAP: Stable liver lesions 2.2 cm, 2 cm.  No new lesions.  Stable bilateral pulmonary nodules (considered benign) 02/11/2024: PET/CT scan: No definite evidence of hypermetabolic metastatic disease.  Equivocal small focus of metabolic activity dome of right hepatic lobe corresponding to the MRI finding.  Remains indeterminate (suggest liver MRI in 3 to 6 months), stable lung nodules   MRI 05/05/2024: stable small hypervascular liver lesions CT CAP 05/17/24: Small lung nodules unchanged, benign liver cysts, no lymphadenopathy or met disease   Return to clinic every 3 weeks  for Herceptin  and Perjeta  She has plans to go to [place] for 5 weeks or so ------------------------------------- Assessment and Plan Assessment & Plan Metastatic breast cancer No new lymph nodes or metastatic disease on imaging. Small bilateral findings unchanged, presumed benign. Unchanged cysts and hemangiomas, MRI suggests nodular hyperplasia. EF and strain low normal, likely due to Herceptin . - Continue current treatment regimen with a four-week break to improve cardiac function. - Schedule echocardiogram in three months to monitor cardiac function. - Discuss rescheduling of CT or other imaging at next visit.  Hyperlipidemia Total cholesterol nearly 300 mg/dL, LDL 843 mg/dL. Cardiac score zero, but medication started due to high cholesterol. HDL 184 mg/dL, likely from exercise and genetics. - Start cholesterol-lowering medication as per primary care physician's recommendation.  Prediabetes Previous A1c around 6%. Aware of prediabetic status, monitoring condition. - Monitor blood glucose levels and A1c as per primary care physician's guidance.      No orders of the defined types were placed in this encounter.  The patient has a good understanding of the overall plan. she agrees with it. she will call with any problems that may develop before the next visit here. Total time spent: 30 mins including face to face time and  time spent for planning, charting and co-ordination of care   Viinay K Skylar Priest, MD 05/25/24

## 2024-05-26 ENCOUNTER — Telehealth: Payer: Self-pay | Admitting: *Deleted

## 2024-05-26 NOTE — Telephone Encounter (Signed)
 Received call from pt requesting to review calcium levels from yesterday. Per MD calcium slightly low at 8.7.  per MD okay for pt to supplement with OTC Tums 60 mg p.o BID.  Pt educated and verbalized understanding.

## 2024-06-10 ENCOUNTER — Ambulatory Visit: Admitting: Hematology and Oncology

## 2024-06-10 ENCOUNTER — Other Ambulatory Visit

## 2024-06-10 ENCOUNTER — Ambulatory Visit

## 2024-06-24 ENCOUNTER — Encounter: Payer: Self-pay | Admitting: *Deleted

## 2024-06-24 NOTE — Progress Notes (Signed)
 Received message from [facility] team stating patient was non responsive to mobile phlebotomy team.  Testing will be canceled at this time.  If pt wishes to proceed in the future, orders will be placed.

## 2024-06-29 ENCOUNTER — Inpatient Hospital Stay: Payer: Self-pay

## 2024-06-29 ENCOUNTER — Inpatient Hospital Stay: Payer: Self-pay | Attending: Hematology and Oncology | Admitting: Hematology and Oncology

## 2024-06-29 ENCOUNTER — Inpatient Hospital Stay: Payer: Self-pay | Attending: Hematology and Oncology

## 2024-06-29 VITALS — BP 131/71 | HR 67 | Temp 98.7°F | Resp 16 | Ht 65.0 in | Wt 174.2 lb

## 2024-06-29 DIAGNOSIS — Z95828 Presence of other vascular implants and grafts: Secondary | ICD-10-CM

## 2024-06-29 DIAGNOSIS — Z79899 Other long term (current) drug therapy: Secondary | ICD-10-CM | POA: Diagnosis not present

## 2024-06-29 DIAGNOSIS — K769 Liver disease, unspecified: Secondary | ICD-10-CM

## 2024-06-29 DIAGNOSIS — C787 Secondary malignant neoplasm of liver and intrahepatic bile duct: Secondary | ICD-10-CM | POA: Insufficient documentation

## 2024-06-29 DIAGNOSIS — Z170 Estrogen receptor positive status [ER+]: Secondary | ICD-10-CM | POA: Diagnosis not present

## 2024-06-29 DIAGNOSIS — C50919 Malignant neoplasm of unspecified site of unspecified female breast: Secondary | ICD-10-CM

## 2024-06-29 DIAGNOSIS — D0511 Intraductal carcinoma in situ of right breast: Secondary | ICD-10-CM | POA: Diagnosis not present

## 2024-06-29 DIAGNOSIS — Z923 Personal history of irradiation: Secondary | ICD-10-CM | POA: Insufficient documentation

## 2024-06-29 DIAGNOSIS — Z5112 Encounter for antineoplastic immunotherapy: Secondary | ICD-10-CM | POA: Diagnosis not present

## 2024-06-29 LAB — CBC WITH DIFFERENTIAL (CANCER CENTER ONLY)
Abs Immature Granulocytes: 0.02 K/uL (ref 0.00–0.07)
Basophils Absolute: 0.1 K/uL (ref 0.0–0.1)
Basophils Relative: 1 %
Eosinophils Absolute: 0.2 K/uL (ref 0.0–0.5)
Eosinophils Relative: 3 %
HCT: 37.6 % (ref 36.0–46.0)
Hemoglobin: 12.6 g/dL (ref 12.0–15.0)
Immature Granulocytes: 0 %
Lymphocytes Relative: 31 %
Lymphs Abs: 2.2 K/uL (ref 0.7–4.0)
MCH: 29.2 pg (ref 26.0–34.0)
MCHC: 33.5 g/dL (ref 30.0–36.0)
MCV: 87 fL (ref 80.0–100.0)
Monocytes Absolute: 0.6 K/uL (ref 0.1–1.0)
Monocytes Relative: 8 %
Neutro Abs: 4.1 K/uL (ref 1.7–7.7)
Neutrophils Relative %: 57 %
Platelet Count: 225 K/uL (ref 150–400)
RBC: 4.32 MIL/uL (ref 3.87–5.11)
RDW: 14.1 % (ref 11.5–15.5)
WBC Count: 7.2 K/uL (ref 4.0–10.5)
nRBC: 0 % (ref 0.0–0.2)

## 2024-06-29 LAB — CMP (CANCER CENTER ONLY)
ALT: 15 U/L (ref 0–44)
AST: 11 U/L — ABNORMAL LOW (ref 15–41)
Albumin: 4.2 g/dL (ref 3.5–5.0)
Alkaline Phosphatase: 77 U/L (ref 38–126)
Anion gap: 5 (ref 5–15)
BUN: 18 mg/dL (ref 6–20)
CO2: 26 mmol/L (ref 22–32)
Calcium: 8.5 mg/dL — ABNORMAL LOW (ref 8.9–10.3)
Chloride: 108 mmol/L (ref 98–111)
Creatinine: 0.67 mg/dL (ref 0.44–1.00)
GFR, Estimated: >60 mL/min (ref >60)
Glucose, Bld: 95 mg/dL (ref 70–99)
Potassium: 3.6 mmol/L (ref 3.5–5.1)
Sodium: 139 mmol/L (ref 135–145)
Total Bilirubin: 0.3 mg/dL (ref 0.0–1.2)
Total Protein: 6.8 g/dL (ref 6.5–8.1)

## 2024-06-29 MED ORDER — ACETAMINOPHEN 325 MG PO TABS
650.0000 mg | ORAL_TABLET | Freq: Once | ORAL | Status: AC
  Administered 2024-06-29: 650 mg via ORAL
  Filled 2024-06-29: qty 2

## 2024-06-29 MED ORDER — SODIUM CHLORIDE 0.9% FLUSH: 10.0000 mL | INTRAVENOUS | Status: DC | PRN

## 2024-06-29 MED ORDER — SODIUM CHLORIDE 0.9 % IV SOLN: INTRAVENOUS | Status: DC

## 2024-06-29 MED ORDER — TRASTUZUMAB-ANNS CHEMO 150 MG IV SOLR
6.0000 mg/kg | Freq: Once | INTRAVENOUS | Status: AC
  Administered 2024-06-29: 483 mg via INTRAVENOUS
  Filled 2024-06-29: qty 23

## 2024-06-29 MED ORDER — SODIUM CHLORIDE 0.9 % IV SOLN
420.0000 mg | Freq: Once | INTRAVENOUS | Status: AC
  Administered 2024-06-29: 420 mg via INTRAVENOUS
  Filled 2024-06-29: qty 14

## 2024-06-29 MED ORDER — SODIUM CHLORIDE 0.9% FLUSH
10.0000 mL | Freq: Once | INTRAVENOUS | Status: AC
  Administered 2024-06-29: 10 mL

## 2024-06-29 MED ORDER — DIPHENHYDRAMINE HCL 25 MG PO CAPS
50.0000 mg | ORAL_CAPSULE | Freq: Once | ORAL | Status: AC
  Administered 2024-06-29: 50 mg via ORAL
  Filled 2024-06-29: qty 2

## 2024-06-29 MED ORDER — HEPARIN SOD (PORK) LOCK FLUSH 100 UNIT/ML IV SOLN
500.0000 [IU] | Freq: Once | INTRAVENOUS | Status: DC | PRN
Start: 2024-06-29 — End: 2024-06-29

## 2024-06-29 NOTE — Patient Instructions (Signed)
 [facility] - A DEPT OF [facility]  Discharge Instructions: Thank you for choosing [facility] to provide your oncology and hematology care.   If you have a lab appointment with the Cancer Center, please go directly to the Cancer Center and check in at the registration area.   Wear comfortable clothing and clothing appropriate for easy access to any Portacath or PICC line.   We strive to give you quality time with your provider. You may need to reschedule your appointment if you arrive late (15 or more minutes).  Arriving late affects you and other patients whose appointments are after yours.  Also, if you miss three or more appointments without notifying the office, you may be dismissed from the clinic at the provider's discretion.      For prescription refill requests, have your pharmacy contact our office and allow 72 hours for refills to be completed.    Today you received the following chemotherapy and/or immunotherapy agents: Kanjinti, Pertuzumab.       To help prevent nausea and vomiting after your treatment, we encourage you to take your nausea medication as directed.  BELOW ARE SYMPTOMS THAT SHOULD BE REPORTED IMMEDIATELY: *FEVER GREATER THAN 100.4 F (38 C) OR HIGHER *CHILLS OR SWEATING *NAUSEA AND VOMITING THAT IS NOT CONTROLLED WITH YOUR NAUSEA MEDICATION *UNUSUAL SHORTNESS OF BREATH *UNUSUAL BRUISING OR BLEEDING *URINARY PROBLEMS (pain or burning when urinating, or frequent urination) *BOWEL PROBLEMS (unusual diarrhea, constipation, pain near the anus) TENDERNESS IN MOUTH AND THROAT WITH OR WITHOUT PRESENCE OF ULCERS (sore throat, sores in mouth, or a toothache) UNUSUAL RASH, SWELLING OR PAIN  UNUSUAL VAGINAL DISCHARGE OR ITCHING   Items with * indicate a potential emergency and should be followed up as soon as possible or go to the Emergency Department if any problems should occur.  Please show the CHEMOTHERAPY ALERT CARD or  IMMUNOTHERAPY ALERT CARD at check-in to the Emergency Department and triage nurse.  Should you have questions after your visit or need to cancel or reschedule your appointment, please contact [facility] - A DEPT OF [facility]  Dept: [phone]  and follow the prompts.  Office hours are 8:00 a.m. to 4:30 p.m. Monday - Friday. Please note that voicemails left after 4:00 p.m. may not be returned until the following business day.  We are closed weekends and major holidays. You have access to a nurse at all times for urgent questions. Please call the main number to the clinic Dept: [phone] and follow the prompts.   For any non-urgent questions, you may also contact your provider using MyChart. We now offer e-Visits for anyone 62 and older to request care online for non-urgent symptoms. For details visit [email or web address].   Also download the MyChart app! Go to the app store, search "MyChart", open the app, select [place], and log in with your MyChart username and password.

## 2024-06-29 NOTE — Assessment & Plan Note (Signed)
 02/11/19: RT lumpectomy: DICS Intermediate grade, Margins Neg, ER 90%, PR 70%; Lt Lumpectomy: UDH status post radiation, could not tolerate tamoxifen  recurrence: 08/15/2021:MRI surveillance detected Right breast calcifications 5 cm, Ant biopsy: IG DCIS with necrosis, ER 40%, PR 0%, Posterior biopsy: Intermediate grade DCIS with [facility]  11/29/2021: Right mastectomy: High-grade DCIS with microinvasion, ER 0%, PR 0%, HER2 positive, 0/1 lymph node negative  T1c MIC, N0: Stage Ia   Patient had second opinion at [facility] with Dr. Eleanora who agreed with her plan of no systemic chemotherapy   Breast cancer surveillance: 1.  Mammogram 08/22/2022: Left breast benign breast density category B  2.  CT CAP 12/09/2022: Multiple liver lesions indicative of cysts and hemangiomas new right hepatic lobe lesion favored to be hemangioma otherwise no evidence of metastatic disease.  Radiology recommended an MRI of the liver. 3.  Liver MRI 01/08/2023: 2 New rim-enhancing lesions 2.3 cm (was 1.6 cm on 12/09/2022), 1.8 cm (measured 1.5 cm), benign hemangioma 2.4 cm and 1.9 cm and 1.3 cm unchanged 4.  PET CT scan 01/22/2023: Liver: 2.5 cm and 2.4 cm lesions ------------------------------------------------------------------------------------------------------------------------------------------------- 01/31/2023: Liver lesion biopsy: Metastatic adenocarcinoma to the liver consistent with breast primary positive for CK7 and GATA3, ER 0%, PR 0%, Ki-67 25%, HER2 3+ positive, [facility] molecular testing 02/21/2023: ER negative PR negative, HER2 amplified, ER positive TMB 4, BRCA negative, ESR 1 unmutated, PD-L1 negative, CPS: 1, PTEN IHC +2+ but mutation not detected   Current treatment: Completed 6 cycles of Taxotere  Herceptin  Perjeta  on 05/30/2023, currently on maintenance Herceptin  and Perjeta  (now being given intravenously because of skin irritation from injections)   Herceptin  Perjeta  toxicities:  Cardiomyopathy: Fatigue and palpitations on  Entresto  and metoprolol .  Follows cardiology Meningioma: Follows with Dr. Buckley, brain MRI  08/04/2023: Stable, new brain MRI has been ordered   PET/CT: 06/19/2023: Interval resolution of the hypermetabolic lesions in the liver. PET/CT 10/03/2023: No evidence of recurrent/metastatic cancer low-density liver lesions less conspicuous without any hypermetabolic activity 01/08/2024: CT CAP: Stable liver lesions 2.2 cm, 2 cm.  No new lesions.  Stable bilateral pulmonary nodules (considered benign) 02/11/2024: PET/CT scan: No definite evidence of hypermetabolic metastatic disease.  Equivocal small focus of metabolic activity dome of right hepatic lobe corresponding to the MRI finding.  Remains indeterminate (suggest liver MRI in 3 to 6 months), stable lung nodules   MRI 05/05/2024: stable small hypervascular liver lesions CT CAP 05/17/24: Small lung nodules unchanged, benign liver cysts, no lymphadenopathy or met disease   Return to clinic every 3 weeks for Herceptin  and Perjeta 

## 2024-06-29 NOTE — Progress Notes (Signed)
 Patient Care Team: Regino Slater, MD as PCP - General (Family Medicine) [name], RN as Oncology Nurse Navigator Glean, Stephane BROCKS, RN (Inactive) as Oncology Nurse Navigator [name], MD as Consulting Physician (Hematology and Oncology) Default, Provider, MD as Technician  DIAGNOSIS:  Encounter Diagnosis  Name Primary?   Carcinoma of breast metastatic to liver, unspecified laterality (HCC) Yes    SUMMARY OF ONCOLOGIC HISTORY: Oncology History  Breast cancer metastasized to liver (HCC)  01/25/2019 Initial Diagnosis   Screening mammogram showed bilateral calcifications right breast 5 mm, pleomorphic.  Left breast 1.6 cm punctate, smaller group of calcifications spanning 3 mm.  Right lower outer quadrant biopsy revealed low-grade DCIS ER 90%, PR 70%; biopsy of left breast calcifications ALH and PASH   01/31/2019 Genetic Testing   ATM c.8495G>A and RECQL4 c.2587G>A VUS identified on the 9-gene STAT panel and Multi-cancer panel through [facility].  The STAT Breast cancer panel offered by [facility] includes sequencing and rearrangement analysis for the following 9 genes:  ATM, BRCA1, BRCA2, CDH1, CHEK2, PALB2, PTEN, STK11 and TP53.   The Multi-Gene Panel offered by [facility] includes sequencing and/or deletion duplication testing of the following 84 genes: AIP, ALK, APC, ATM, AXIN2,BAP1,  BARD1, BLM, BMPR1A, BRCA1, BRCA2, BRIP1, CASR, CDC73, CDH1, CDK4, CDKN1B, CDKN1C, CDKN2A (p14ARF), CDKN2A (p16INK4a), CEBPA, CHEK2, CTNNA1, DICER1, DIS3L2, EGFR (c.2369C>T, p.Thr790Met variant only), EPCAM (Deletion/duplication testing only), FH, FLCN, GATA2, GPC3, GREM1 (Promoter region deletion/duplication testing only), HOXB13 (c.251G>A, p.Gly84Glu), HRAS, KIT, MAX, MEN1, MET, MITF (c.952G>A, p.Glu318Lys variant only), MLH1, MSH2, MSH3, MSH6, MUTYH, NBN, NF1, NF2, NTHL1, PALB2, PDGFRA, PHOX2B, PMS2, POLD1, POLE, POT1, PRKAR1A, PTCH1, PTEN, RAD50, RAD51C, RAD51D, RB1, RECQL4, RET, RUNX1, SDHAF2, SDHA (sequence  changes only), SDHB, SDHC, SDHD, SMAD4, SMARCA4, SMARCB1, SMARCE1, STK11, SUFU, TERC, TERT, TMEM127, TP53, TSC1, TSC2, VHL, WRN and WT1.  he report date is February 02, 2019.   02/11/2019 Surgery   RT lumpectomy: DICS Intermediate grade, Margins Neg, ER 90%, PR 70%; Lt Lumpectomy: UDH   02/26/2019 -  Anti-estrogen oral therapy   Tamoxifen  20mg  daily, stopped because of depression and facial numbness   05/20/2019 - 06/17/2019 Radiation Therapy   Adjuvant XRT   08/15/2021 Relapse/Recurrence   MRI surveillance detected Right breast calcifications 5 cm, Ant biopsy: IG DCIS with necrosis, ER 40%, PR 0%, Posterior biopsy: Intermediate grade DCIS with [facility]   08/19/2022 Cancer Staging   Staging form: Breast, AJCC 8th Edition - Clinical: Stage IV (cT22mi, cNX, cM1, G3, ER: Unknown, PR: Unknown, HER2: Unknown) - Signed by [name], MD on 02/06/2023 Histologic grading system: 3 grade system   02/14/2023 - 01/15/2024 Chemotherapy   Patient is on Treatment Plan : BREAST DOCEtaxel  + Trastuzumab  + Pertuzumab  (THP) q21d x 8 cycles / Trastuzumab  + Pertuzumab  q21d x 4 cycles     02/05/2024 -  Chemotherapy   Patient is on Treatment Plan : BREAST Trastuzumab   + Pertuzumab  q21d x 13 cycles     AML (acute myeloid leukemia) (HCC)  09/1998 Initial Diagnosis   AML (acute myeloid leukemia) (HCC) treated with 2 induction regimens followed by 6 consolidation regimens, remission     CHIEF COMPLIANT: Metastatic breast cancer on Herceptin  Perjeta  maintenance  HISTORY OF PRESENT ILLNESS:  History of Present Illness Joann Meadows is a 48 year old female with HER2 positive breast cancer who presents for follow-up and Herceptin  Perjeta  infusion  She is undergoing treatment for HER2 positive breast cancer. A brain MRI is scheduled for September 4th and an echocardiogram for September  26th. She learned about bone injections every three months from a friend with HER2 positive cancer on anastrozole, though she is estrogen  negative and not on anastrozole. Recent blood work shows slightly low calcium levels despite daily supplementation and regular mozzarella cheese consumption. Her blood sugar levels remain normal, possibly due to increased physical activity during a recent trip. Her last CT scan was on June 23rd. She inquired about repeating the Guardian test and monitoring cancer markers, which were clear during treatments last year.     ALLERGIES:  is allergic to levaquin  [levofloxacin ], tamoxifen  citrate, and tape.  MEDICATIONS:  Current Outpatient Medications  Medication Sig Dispense Refill   acetaminophen  (TYLENOL ) 325 MG tablet Take 650 mg by mouth every 6 (six) hours as needed.     ALPRAZolam (XANAX) 0.25 MG tablet      cholecalciferol (VITAMIN D3) 25 MCG (1000 UNIT) tablet Take 2,000 Units by mouth daily.     fluconazole  (DIFLUCAN ) 100 MG tablet Take 1 tablet (100 mg total) by mouth daily. 15 tablet 1   fluticasone (FLONASE) 50 MCG/ACT nasal spray Place 1 spray into both nostrils daily.     Ibuprofen  (ADVIL ) 200 MG CAPS Take 1 capsule by mouth as needed.     loratadine (CLARITIN) 10 MG tablet 1 tablet Orally Once a day     metroNIDAZOLE (FLAGYL) 500 MG tablet Take 500 mg by mouth 2 (two) times daily.     phenylephrine  (SUDAFED PE) 10 MG TABS tablet Take 10 mg by mouth as needed.     potassium chloride  SA (KLOR-CON  M) 20 MEQ tablet TAKE 1 TABLET BY MOUTH EVERY DAY 90 tablet 1   Zinc-Vitamin C (ZINC-A-COLD/VITAMIN C MT) Use as directed 1 capsule in the mouth or throat daily.     sacubitril -valsartan  (ENTRESTO ) 97-103 MG Take 1 tablet by mouth 2 (two) times daily. (Patient not taking: Reported on 06/29/2024) 60 tablet 11   No current facility-administered medications for this visit.    PHYSICAL EXAMINATION: ECOG PERFORMANCE STATUS: 1 - Symptomatic but completely ambulatory  Vitals:   06/29/24 0815  BP: 131/71  Pulse: 67  Resp: 16  Temp: 98.7 F (37.1 C)  SpO2: 100%   Filed Weights   06/29/24  0815  Weight: 174 lb 3.2 oz (79 kg)    Physical Exam    (exam performed in the presence of a chaperone)  LABORATORY DATA:  I have reviewed the data as listed    Latest Ref Rng & Units 06/29/2024    7:51 AM 05/25/2024    7:37 AM 04/29/2024    9:18 AM  CMP  Glucose 70 - 99 mg/dL 95  896  898   BUN 6 - 20 mg/dL 18  19  12    Creatinine 0.44 - 1.00 mg/dL 9.32  9.29  9.29   Sodium 135 - 145 mmol/L 139  138  140   Potassium 3.5 - 5.1 mmol/L 3.6  3.7  3.8   Chloride 98 - 111 mmol/L 108  106  107   CO2 22 - 32 mmol/L 26  26  28    Calcium 8.9 - 10.3 mg/dL 8.5  8.7  9.1   Total Protein 6.5 - 8.1 g/dL 6.8  6.6  7.0   Total Bilirubin 0.0 - 1.2 mg/dL 0.3  0.4  0.5   Alkaline Phos 38 - 126 U/L 77  90  73   AST 15 - 41 U/L 11  14  17    ALT 0 - 44 U/L 15  17  19     Lab Results  Component Value Date   WBC 7.2 06/29/2024   HGB 12.6 06/29/2024   HCT 37.6 06/29/2024   MCV 87.0 06/29/2024   PLT 225 06/29/2024   NEUTROABS 4.1 06/29/2024    ASSESSMENT & PLAN:  Breast cancer metastasized to liver (HCC) 02/11/19: RT lumpectomy: DICS Intermediate grade, Margins Neg, ER 90%, PR 70%; Lt Lumpectomy: UDH status post radiation, could not tolerate tamoxifen  recurrence: 08/15/2021:MRI surveillance detected Right breast calcifications 5 cm, Ant biopsy: IG DCIS with necrosis, ER 40%, PR 0%, Posterior biopsy: Intermediate grade DCIS with [facility]  11/29/2021: Right mastectomy: High-grade DCIS with microinvasion, ER 0%, PR 0%, HER2 positive, 0/1 lymph node negative  T1c MIC, N0: Stage Ia   Patient had second opinion at [facility] with Dr. Eleanora who agreed with her plan of no systemic chemotherapy   Breast cancer surveillance: 1.  Mammogram 08/22/2022: Left breast benign breast density category B  2.  CT CAP 12/09/2022: Multiple liver lesions indicative of cysts and hemangiomas new right hepatic lobe lesion favored to be hemangioma otherwise no evidence of metastatic disease.  Radiology recommended an MRI of the  liver. 3.  Liver MRI 01/08/2023: 2 New rim-enhancing lesions 2.3 cm (was 1.6 cm on 12/09/2022), 1.8 cm (measured 1.5 cm), benign hemangioma 2.4 cm and 1.9 cm and 1.3 cm unchanged 4.  PET CT scan 01/22/2023: Liver: 2.5 cm and 2.4 cm lesions ------------------------------------------------------------------------------------------------------------------------------------------------- 01/31/2023: Liver lesion biopsy: Metastatic adenocarcinoma to the liver consistent with breast primary positive for CK7 and GATA3, ER 0%, PR 0%, Ki-67 25%, HER2 3+ positive, [facility] molecular testing 02/21/2023: ER negative PR negative, HER2 amplified, ER positive TMB 4, BRCA negative, ESR 1 unmutated, PD-L1 negative, CPS: 1, PTEN IHC +2+ but mutation not detected   Current treatment: Completed 6 cycles of Taxotere  Herceptin  Perjeta  on 05/30/2023, currently on maintenance Herceptin  and Perjeta  (now being given intravenously because of skin irritation from injections)   Herceptin  Perjeta  toxicities:  Cardiomyopathy: Fatigue and palpitations on Entresto  and metoprolol .  Follows cardiology Meningioma: Follows with Dr. Buckley, brain MRI  08/04/2023: Stable, new brain MRI has been ordered   PET/CT: 06/19/2023: Interval resolution of the hypermetabolic lesions in the liver. PET/CT 10/03/2023: No evidence of recurrent/metastatic cancer low-density liver lesions less conspicuous without any hypermetabolic activity 01/08/2024: CT CAP: Stable liver lesions 2.2 cm, 2 cm.  No new lesions.  Stable bilateral pulmonary nodules (considered benign) 02/11/2024: PET/CT scan: No definite evidence of hypermetabolic metastatic disease.  Equivocal small focus of metabolic activity dome of right hepatic lobe corresponding to the MRI finding.  Remains indeterminate (suggest liver MRI in 3 to 6 months), stable lung nodules   MRI 05/05/2024: stable small hypervascular liver lesions CT CAP 05/17/24: Small lung nodules unchanged, benign liver cysts, no  lymphadenopathy or met disease   PET CT scan on September 23 Return to clinic every 3 weeks for Herceptin  and Perjeta    ------------------------------------- Assessment and Plan Assessment & Plan Metastatic breast cancer with liver involvement HER2 positive, estrogen negative. Tolerating infusions well. Stable electrolytes, kidney, and liver function. Slight calcium fluctuation not concerning. Normal blood sugar. PET scan planned for September. - Schedule infusion appointments on Thursdays. - Order PET scan in September. - Add CA 27, 29 tumor markers to next blood draw as standing order.      Orders Placed This Encounter  Procedures   NM PET Image Restag (PS) Skull Base To Thigh    Standing Status:   Future  Expected Date:   08/17/2024    Expiration Date:   06/29/2025    If indicated for the ordered procedure, I authorize the administration of a radiopharmaceutical per Radiology protocol:   Yes    Is the patient pregnant?:   No    Preferred imaging location?:   [facility]    Release to patient:   Immediate   CA 27.29    Standing Status:   Standing    Number of Occurrences:   100    Expiration Date:   06/29/2025   The patient has a good understanding of the overall plan. she agrees with it. she will call with any problems that may develop before the next visit here. Total time spent: 30 mins including face to face time and time spent for planning, charting and co-ordination of care   Naomi MARLA Chad, MD 06/29/24

## 2024-07-01 ENCOUNTER — Ambulatory Visit

## 2024-07-05 ENCOUNTER — Other Ambulatory Visit: Payer: Self-pay

## 2024-07-05 ENCOUNTER — Ambulatory Visit: Payer: Self-pay | Admitting: Hematology and Oncology

## 2024-07-05 ENCOUNTER — Ambulatory Visit: Payer: Self-pay

## 2024-07-07 ENCOUNTER — Telehealth: Payer: Self-pay | Admitting: Internal Medicine

## 2024-07-07 NOTE — Telephone Encounter (Signed)
 Scheduled appointments per staff message. Talked with the patient and she is aware of the made appointments.

## 2024-07-15 DIAGNOSIS — R7301 Impaired fasting glucose: Secondary | ICD-10-CM | POA: Diagnosis not present

## 2024-07-15 DIAGNOSIS — Z23 Encounter for immunization: Secondary | ICD-10-CM | POA: Diagnosis not present

## 2024-07-15 DIAGNOSIS — I10 Essential (primary) hypertension: Secondary | ICD-10-CM | POA: Diagnosis not present

## 2024-07-15 DIAGNOSIS — Z79899 Other long term (current) drug therapy: Secondary | ICD-10-CM | POA: Diagnosis not present

## 2024-07-15 DIAGNOSIS — E559 Vitamin D deficiency, unspecified: Secondary | ICD-10-CM | POA: Diagnosis not present

## 2024-07-15 DIAGNOSIS — E7800 Pure hypercholesterolemia, unspecified: Secondary | ICD-10-CM | POA: Diagnosis not present

## 2024-07-22 ENCOUNTER — Inpatient Hospital Stay

## 2024-07-22 VITALS — BP 143/99 | HR 72 | Temp 97.7°F | Resp 16 | Ht 65.0 in | Wt 173.2 lb

## 2024-07-22 DIAGNOSIS — C50919 Malignant neoplasm of unspecified site of unspecified female breast: Secondary | ICD-10-CM

## 2024-07-22 DIAGNOSIS — D0511 Intraductal carcinoma in situ of right breast: Secondary | ICD-10-CM | POA: Diagnosis not present

## 2024-07-22 DIAGNOSIS — Z923 Personal history of irradiation: Secondary | ICD-10-CM | POA: Diagnosis not present

## 2024-07-22 DIAGNOSIS — Z79899 Other long term (current) drug therapy: Secondary | ICD-10-CM | POA: Diagnosis not present

## 2024-07-22 DIAGNOSIS — C787 Secondary malignant neoplasm of liver and intrahepatic bile duct: Secondary | ICD-10-CM | POA: Diagnosis not present

## 2024-07-22 DIAGNOSIS — Z170 Estrogen receptor positive status [ER+]: Secondary | ICD-10-CM | POA: Diagnosis not present

## 2024-07-22 DIAGNOSIS — K769 Liver disease, unspecified: Secondary | ICD-10-CM

## 2024-07-22 DIAGNOSIS — Z5112 Encounter for antineoplastic immunotherapy: Secondary | ICD-10-CM | POA: Diagnosis not present

## 2024-07-22 DIAGNOSIS — Z95828 Presence of other vascular implants and grafts: Secondary | ICD-10-CM

## 2024-07-22 LAB — CBC WITH DIFFERENTIAL (CANCER CENTER ONLY)
Abs Immature Granulocytes: 0 K/uL (ref 0.00–0.07)
Basophils Absolute: 0.1 K/uL (ref 0.0–0.1)
Basophils Relative: 1 %
Eosinophils Absolute: 0.2 K/uL (ref 0.0–0.5)
Eosinophils Relative: 4 %
HCT: 37 % (ref 36.0–46.0)
Hemoglobin: 12.4 g/dL (ref 12.0–15.0)
Immature Granulocytes: 0 %
Lymphocytes Relative: 35 %
Lymphs Abs: 1.8 K/uL (ref 0.7–4.0)
MCH: 29.3 pg (ref 26.0–34.0)
MCHC: 33.5 g/dL (ref 30.0–36.0)
MCV: 87.5 fL (ref 80.0–100.0)
Monocytes Absolute: 0.4 K/uL (ref 0.1–1.0)
Monocytes Relative: 8 %
Neutro Abs: 2.7 K/uL (ref 1.7–7.7)
Neutrophils Relative %: 52 %
Platelet Count: 192 K/uL (ref 150–400)
RBC: 4.23 MIL/uL (ref 3.87–5.11)
RDW: 13.2 % (ref 11.5–15.5)
WBC Count: 5.2 K/uL (ref 4.0–10.5)
nRBC: 0 % (ref 0.0–0.2)

## 2024-07-22 LAB — CMP (CANCER CENTER ONLY)
ALT: 14 U/L (ref 0–44)
AST: 12 U/L — ABNORMAL LOW (ref 15–41)
Albumin: 4 g/dL (ref 3.5–5.0)
Alkaline Phosphatase: 62 U/L (ref 38–126)
Anion gap: 5 (ref 5–15)
BUN: 18 mg/dL (ref 6–20)
CO2: 27 mmol/L (ref 22–32)
Calcium: 8.6 mg/dL — ABNORMAL LOW (ref 8.9–10.3)
Chloride: 107 mmol/L (ref 98–111)
Creatinine: 0.65 mg/dL (ref 0.44–1.00)
GFR, Estimated: >60 mL/min (ref >60)
Glucose, Bld: 101 mg/dL — ABNORMAL HIGH (ref 70–99)
Potassium: 3.9 mmol/L (ref 3.5–5.1)
Sodium: 139 mmol/L (ref 135–145)
Total Bilirubin: 0.3 mg/dL (ref 0.0–1.2)
Total Protein: 6.4 g/dL — ABNORMAL LOW (ref 6.5–8.1)

## 2024-07-22 MED ORDER — ACETAMINOPHEN 325 MG PO TABS
650.0000 mg | ORAL_TABLET | Freq: Once | ORAL | Status: AC
  Administered 2024-07-22: 650 mg via ORAL
  Filled 2024-07-22: qty 2

## 2024-07-22 MED ORDER — SODIUM CHLORIDE 0.9 % IV SOLN: INTRAVENOUS | Status: DC

## 2024-07-22 MED ORDER — SODIUM CHLORIDE 0.9% FLUSH
10.0000 mL | Freq: Once | INTRAVENOUS | Status: AC
  Administered 2024-07-22: 10 mL

## 2024-07-22 MED ORDER — SODIUM CHLORIDE 0.9 % IV SOLN
420.0000 mg | Freq: Once | INTRAVENOUS | Status: AC
  Administered 2024-07-22: 420 mg via INTRAVENOUS
  Filled 2024-07-22: qty 14

## 2024-07-22 MED ORDER — SODIUM CHLORIDE 0.9% FLUSH: 10.0000 mL | INTRAVENOUS | Status: DC | PRN

## 2024-07-22 MED ORDER — DIPHENHYDRAMINE HCL 25 MG PO CAPS
50.0000 mg | ORAL_CAPSULE | Freq: Once | ORAL | Status: AC
  Administered 2024-07-22: 50 mg via ORAL
  Filled 2024-07-22: qty 2

## 2024-07-22 MED ORDER — TRASTUZUMAB-ANNS CHEMO 150 MG IV SOLR
6.0000 mg/kg | Freq: Once | INTRAVENOUS | Status: AC
  Administered 2024-07-22: 483 mg via INTRAVENOUS
  Filled 2024-07-22: qty 23

## 2024-07-22 NOTE — Patient Instructions (Signed)
 [facility] - A DEPT OF [facility]  Discharge Instructions: Thank you for choosing [facility] to provide your oncology and hematology care.   If you have a lab appointment with the Cancer Center, please go directly to the Cancer Center and check in at the registration area.   Wear comfortable clothing and clothing appropriate for easy access to any Portacath or PICC line.   We strive to give you quality time with your provider. You may need to reschedule your appointment if you arrive late (15 or more minutes).  Arriving late affects you and other patients whose appointments are after yours.  Also, if you miss three or more appointments without notifying the office, you may be dismissed from the clinic at the provider's discretion.      For prescription refill requests, have your pharmacy contact our office and allow 72 hours for refills to be completed.    Today you received the following chemotherapy and/or immunotherapy agents: Trastuzumab -anns (Kanjinti ) & Pertuzumab  (Perjeta )    To help prevent nausea and vomiting after your treatment, we encourage you to take your nausea medication as directed.  BELOW ARE SYMPTOMS THAT SHOULD BE REPORTED IMMEDIATELY: *FEVER GREATER THAN 100.4 F (38 C) OR HIGHER *CHILLS OR SWEATING *NAUSEA AND VOMITING THAT IS NOT CONTROLLED WITH YOUR NAUSEA MEDICATION *UNUSUAL SHORTNESS OF BREATH *UNUSUAL BRUISING OR BLEEDING *URINARY PROBLEMS (pain or burning when urinating, or frequent urination) *BOWEL PROBLEMS (unusual diarrhea, constipation, pain near the anus) TENDERNESS IN MOUTH AND THROAT WITH OR WITHOUT PRESENCE OF ULCERS (sore throat, sores in mouth, or a toothache) UNUSUAL RASH, SWELLING OR PAIN  UNUSUAL VAGINAL DISCHARGE OR ITCHING   Items with * indicate a potential emergency and should be followed up as soon as possible or go to the Emergency Department if any problems should occur.  Please show the  CHEMOTHERAPY ALERT CARD or IMMUNOTHERAPY ALERT CARD at check-in to the Emergency Department and triage nurse.  Should you have questions after your visit or need to cancel or reschedule your appointment, please contact [facility] - A DEPT OF [facility]  Dept: [phone]  and follow the prompts.  Office hours are 8:00 a.m. to 4:30 p.m. Monday - Friday. Please note that voicemails left after 4:00 p.m. may not be returned until the following business day.  We are closed weekends and major holidays. You have access to a nurse at all times for urgent questions. Please call the main number to the clinic Dept: [phone] and follow the prompts.   For any non-urgent questions, you may also contact your provider using MyChart. We now offer e-Visits for anyone 36 and older to request care online for non-urgent symptoms. For details visit [email or web address].   Also download the MyChart app! Go to the app store, search MyChart, open the app, select [place], and log in with your MyChart username and password.

## 2024-07-22 NOTE — Progress Notes (Signed)
 Pertuzumab  (Perjeta ) accidentally started before Trastuzumab -anns (Kanjinti ). Paused and notified pharmacy. Verified with Brandy in pharmacy to finish Perjeta  then complete Kanjinti  after.    Pharmacy verified that Kanjinti  could be administered during the 30 minute post observation for Perjeta . Vital signs stable at discharge, no complaints from patient, ambulatory to lobby.

## 2024-07-23 LAB — CANCER ANTIGEN 27.29: CA 27.29: 22.1 U/mL (ref 0.0–38.6)

## 2024-07-29 ENCOUNTER — Inpatient Hospital Stay
Admission: RE | Admit: 2024-07-29 | Discharge: 2024-07-29 | Disposition: A | Discharge status: Home / Self Care | Payer: BC Managed Care – PPO | Source: Ambulatory Visit | Attending: Internal Medicine

## 2024-07-29 DIAGNOSIS — D320 Benign neoplasm of cerebral meninges: Secondary | ICD-10-CM | POA: Diagnosis not present

## 2024-07-29 DIAGNOSIS — D329 Benign neoplasm of meninges, unspecified: Secondary | ICD-10-CM

## 2024-07-29 MED ORDER — GADOPICLENOL 0.5 MMOL/ML IV SOLN
7.5000 mL | Freq: Once | INTRAVENOUS | Status: AC | PRN
  Administered 2024-07-29: 7.5 mL via INTRAVENOUS

## 2024-08-05 ENCOUNTER — Inpatient Hospital Stay: Attending: Hematology and Oncology | Admitting: Internal Medicine

## 2024-08-05 VITALS — BP 143/87 | HR 69 | Temp 97.5°F | Resp 20 | Wt 174.4 lb

## 2024-08-05 DIAGNOSIS — Z170 Estrogen receptor positive status [ER+]: Secondary | ICD-10-CM | POA: Insufficient documentation

## 2024-08-05 DIAGNOSIS — D329 Benign neoplasm of meninges, unspecified: Secondary | ICD-10-CM

## 2024-08-05 DIAGNOSIS — Z9011 Acquired absence of right breast and nipple: Secondary | ICD-10-CM | POA: Insufficient documentation

## 2024-08-05 DIAGNOSIS — D259 Leiomyoma of uterus, unspecified: Secondary | ICD-10-CM | POA: Diagnosis not present

## 2024-08-05 DIAGNOSIS — R918 Other nonspecific abnormal finding of lung field: Secondary | ICD-10-CM | POA: Diagnosis not present

## 2024-08-05 DIAGNOSIS — C787 Secondary malignant neoplasm of liver and intrahepatic bile duct: Secondary | ICD-10-CM | POA: Insufficient documentation

## 2024-08-05 DIAGNOSIS — D0511 Intraductal carcinoma in situ of right breast: Secondary | ICD-10-CM | POA: Diagnosis not present

## 2024-08-05 DIAGNOSIS — Z923 Personal history of irradiation: Secondary | ICD-10-CM | POA: Diagnosis not present

## 2024-08-05 DIAGNOSIS — Z79899 Other long term (current) drug therapy: Secondary | ICD-10-CM | POA: Diagnosis not present

## 2024-08-05 DIAGNOSIS — Z9221 Personal history of antineoplastic chemotherapy: Secondary | ICD-10-CM | POA: Diagnosis not present

## 2024-08-05 DIAGNOSIS — Z5112 Encounter for antineoplastic immunotherapy: Secondary | ICD-10-CM | POA: Insufficient documentation

## 2024-08-05 NOTE — Progress Notes (Signed)
 [facility] [address]  [place], [place] [address] [phone]   Interval Evaluation  Date of Service: 08/05/24 Patient Name: Joann Meadows Patient MRN: [id number] Patient DOB: 01-21-1976 Provider: Jalyne Brodzinski K Bracy Pepper, MD  Identifying Statement:  [name] is a 48 y.o. female with [facility])   Primary Cancer:  Oncologic History: Oncology History  Breast cancer metastasized to liver (HCC)  01/25/2019 Initial Diagnosis   Screening mammogram showed bilateral calcifications right breast 5 mm, pleomorphic.  Left breast 1.6 cm punctate, smaller group of calcifications spanning 3 mm.  Right lower outer quadrant biopsy revealed low-grade DCIS ER 90%, PR 70%; biopsy of left breast calcifications ALH and PASH   01/31/2019 Genetic Testing   ATM c.8495G>A and RECQL4 c.2587G>A VUS identified on the 9-gene STAT panel and Multi-cancer panel through [facility].  The STAT Breast cancer panel offered by [facility] includes sequencing and rearrangement analysis for the following 9 genes:  ATM, BRCA1, BRCA2, CDH1, CHEK2, PALB2, PTEN, STK11 and TP53.   The Multi-Gene Panel offered by [facility] includes sequencing and/or deletion duplication testing of the following 84 genes: AIP, ALK, APC, ATM, AXIN2,BAP1,  BARD1, BLM, BMPR1A, BRCA1, BRCA2, BRIP1, CASR, CDC73, CDH1, CDK4, CDKN1B, CDKN1C, CDKN2A (p14ARF), CDKN2A (p16INK4a), CEBPA, CHEK2, CTNNA1, DICER1, DIS3L2, EGFR (c.2369C>T, p.Thr790Met variant only), EPCAM (Deletion/duplication testing only), FH, FLCN, GATA2, GPC3, GREM1 (Promoter region deletion/duplication testing only), HOXB13 (c.251G>A, p.Gly84Glu), HRAS, KIT, MAX, MEN1, MET, MITF (c.952G>A, p.Glu318Lys variant only), MLH1, MSH2, MSH3, MSH6, MUTYH, NBN, NF1, NF2, NTHL1, PALB2, PDGFRA, PHOX2B, PMS2, POLD1, POLE, POT1, PRKAR1A, PTCH1, PTEN, RAD50, RAD51C, RAD51D, RB1, RECQL4, RET, RUNX1, SDHAF2, SDHA (sequence changes only), SDHB, SDHC, SDHD, SMAD4, SMARCA4, SMARCB1,  SMARCE1, STK11, SUFU, TERC, TERT, TMEM127, TP53, TSC1, TSC2, VHL, WRN and WT1.  he report date is February 02, 2019.   02/11/2019 Surgery   RT lumpectomy: DICS Intermediate grade, Margins Neg, ER 90%, PR 70%; Lt Lumpectomy: UDH   02/26/2019 -  Anti-estrogen oral therapy   Tamoxifen  20mg  daily, stopped because of depression and facial numbness   05/20/2019 - 06/17/2019 Radiation Therapy   Adjuvant XRT   08/15/2021 Relapse/Recurrence   MRI surveillance detected Right breast calcifications 5 cm, Ant biopsy: IG DCIS with necrosis, ER 40%, PR 0%, Posterior biopsy: Intermediate grade DCIS with [facility]   08/19/2022 Cancer Staging   Staging form: Breast, AJCC 8th Edition - Clinical: Stage IV (cT15mi, cNX, cM1, G3, ER: Unknown, PR: Unknown, HER2: Unknown) - Signed by [name], MD on 02/06/2023 Histologic grading system: 3 grade system   02/14/2023 - 01/15/2024 Chemotherapy   Patient is on Treatment Plan : BREAST DOCEtaxel  + Trastuzumab  + Pertuzumab  (THP) q21d x 8 cycles / Trastuzumab  + Pertuzumab  q21d x 4 cycles     02/05/2024 -  Chemotherapy   Patient is on Treatment Plan : BREAST Trastuzumab   + Pertuzumab  q21d x 13 cycles     AML (acute myeloid leukemia) (HCC)  09/1998 Initial Diagnosis   AML (acute myeloid leukemia) (HCC) treated with 2 induction regimens followed by 6 consolidation regimens, remission     Interval History: [name] presents today for follow up after recent MRI brain.  No new or progressive neurologic complaints.  Headaches are still present at times, not completely resolved.  There is some lingering dizziness when she is tired.  Continues on Kanjinti  with Dr. Gudena.  H+P (01/30/23) Patient presents to review recent MRI brain findings.  She describes 1-2 months history of headaches, described as modest pressure on  sides and sometimes top of head.  It is not associated with any nausea, photophobia.  She does have more remote headache history.  Sleep has been very poor, just 2-3  hours per night.  She attributes this to increased stress surrounding her progressive breast cancer.  Has systemic therapy planned with Dr. Gudena.  Medications: Current Outpatient Medications on File Prior to Visit  Medication Sig Dispense Refill   acetaminophen  (TYLENOL ) 325 MG tablet Take 650 mg by mouth every 6 (six) hours as needed.     ALPRAZolam (XANAX) 0.25 MG tablet      cholecalciferol (VITAMIN D3) 25 MCG (1000 UNIT) tablet Take 2,000 Units by mouth daily.     fluconazole  (DIFLUCAN ) 100 MG tablet Take 1 tablet (100 mg total) by mouth daily. 15 tablet 1   fluticasone (FLONASE) 50 MCG/ACT nasal spray Place 1 spray into both nostrils daily.     Ibuprofen  (ADVIL ) 200 MG CAPS Take 1 capsule by mouth as needed.     loratadine (CLARITIN) 10 MG tablet 1 tablet Orally Once a day     metroNIDAZOLE (FLAGYL) 500 MG tablet Take 500 mg by mouth 2 (two) times daily.     phenylephrine  (SUDAFED PE) 10 MG TABS tablet Take 10 mg by mouth as needed.     potassium chloride  SA (KLOR-CON  M) 20 MEQ tablet TAKE 1 TABLET BY MOUTH EVERY DAY 90 tablet 1   sacubitril -valsartan  (ENTRESTO ) 97-103 MG Take 1 tablet by mouth 2 (two) times daily. 60 tablet 11   Zinc-Vitamin C (ZINC-A-COLD/VITAMIN C MT) Use as directed 1 capsule in the mouth or throat daily.     No current facility-administered medications on file prior to visit.    Allergies:  Allergies  Allergen Reactions   Levaquin  [Levofloxacin ] Swelling    Swelling feet and hands   Tamoxifen  Citrate     Other reaction(s): depression, joint pain   Tape Rash   Past Medical History:  Past Medical History:  Diagnosis Date   Breast cancer (HCC) 2020   Right Breast Cancer   Endometriosis    Fibroid    Hx of radiation therapy 06/2019   Leukemia (HCC)    20 years ago   Personal history of radiation therapy 2020   Right Breast Cancer   PONV (postoperative nausea and vomiting)    pt states she vomits after colonoscopies   Past Surgical History:  Past  Surgical History:  Procedure Laterality Date   BREAST EXCISIONAL BIOPSY Left 02/11/2019   [facility]   BREAST LUMPECTOMY Right 02/11/2019   BREAST LUMPECTOMY WITH RADIOACTIVE SEED LOCALIZATION Bilateral 02/11/2019   Procedure: BILATERAL BREAST LUMPECTOMIES WITH BILATERAL RADIOACTIVE SEED LOCALIZATION;  Surgeon: Belinda Cough, MD;  Location: [facility];  Service: General;  Laterality: Bilateral;   IR IMAGING GUIDED PORT INSERTION  02/25/2023   IR RADIOLOGIST EVAL & MGMT  02/27/2023   IR RADIOLOGIST EVAL & MGMT  01/22/2024   IR RADIOLOGIST EVAL & MGMT  01/29/2024   MASTECTOMY Right 11/2021   Social History:  Social History   Socioeconomic History   Marital status: Single    Spouse name: Not on file   Number of children: 2   Years of education: Not on file   Highest education level: High school graduate  Occupational History   Not on file  Tobacco Use   Smoking status: Former    Current packs/day: 0.00    Types: Cigarettes    Start date: 01/24/2005    Quit date: 01/25/2015  Years since quitting: 9.5   Smokeless tobacco: Never  Vaping Use   Vaping status: Never Used  Substance and Sexual Activity   Alcohol use: No   Drug use: No   Sexual activity: Yes    Birth control/protection: None  Other Topics Concern   Not on file  Social History Narrative   Lives with children   Social Drivers of Health   Financial Resource Strain: Not on file  Food Insecurity: No Food Insecurity (10/03/2022)   Hunger Vital Sign    Worried About Running Out of Food in the Last Year: Never true    Ran Out of Food in the Last Year: Never true  Transportation Needs: No Transportation Needs (10/03/2022)   PRAPARE - Administrator, Civil Service (Medical): No    Lack of Transportation (Non-Medical): No  Physical Activity: Not on file  Stress: Not on file  Social Connections: Not on file  Intimate Partner Violence: Not At Risk (02/26/2019)   Humiliation, Afraid, Rape, and Kick  questionnaire    Fear of Current or Ex-Partner: No    Emotionally Abused: No    Physically Abused: No    Sexually Abused: No   Family History:  Family History  Problem Relation Age of Onset   Colon cancer Mother 67       d. 91   Hypertension Father    Diabetes Maternal Aunt    Diabetes Maternal Uncle    Mental retardation Maternal Uncle    Diabetes Paternal Aunt    Diabetes Maternal Grandmother    Stroke Paternal Grandmother        c. 31   Leukemia Paternal Grandfather        d. 23; ?CLL   Leukemia Cousin 4       mat first cousin   Lymphoma Cousin 40       pat first cousin    Review of Systems: Constitutional: Doesn't report fevers, chills or abnormal weight loss Eyes: Doesn't report blurriness of vision Ears, nose, mouth, throat, and face: Doesn't report sore throat Respiratory: Doesn't report cough, dyspnea or wheezes Cardiovascular: Doesn't report palpitation, chest discomfort  Gastrointestinal:  Doesn't report nausea, constipation, diarrhea GU: Doesn't report incontinence Skin: Doesn't report skin rashes Neurological: Per HPI Musculoskeletal: Doesn't report joint pain Behavioral/Psych: Doesn't report anxiety  Physical Exam: Vitals:   08/05/24 0949 08/05/24 1002  BP: (!) 141/91 (!) 143/87  Pulse:  69  Resp:  20  Temp:  (!) 97.5 F (36.4 C)  SpO2:  98%     KPS: 90. General: Alert, cooperative, pleasant, in no acute distress Head: Normal EENT: No conjunctival injection or scleral icterus.  Lungs: Resp effort normal Cardiac: Regular rate Abdomen: Non-distended abdomen Skin: No rashes cyanosis or petechiae. Extremities: No clubbing or edema  Neurologic Exam: Mental Status: Awake, alert, attentive to examiner. Oriented to self and environment. Language is fluent with intact comprehension.  Cranial Nerves: Visual acuity is grossly normal. Visual fields are full. Extra-ocular movements intact. No ptosis. Face is symmetric Motor: Tone and bulk are normal.  Power is full in both arms and legs. Reflexes are symmetric, no pathologic reflexes present.  Sensory: Intact to light touch Gait: Normal.   Labs: I have reviewed the data as listed    Component Value Date/Time   NA 139 07/22/2024 0811   K 3.9 07/22/2024 0811   CL 107 07/22/2024 0811   CO2 27 07/22/2024 0811   GLUCOSE 101 (H) 07/22/2024 0811   BUN  18 07/22/2024 0811   CREATININE 0.65 07/22/2024 0811   CALCIUM 8.6 (L) 07/22/2024 0811   PROT 6.4 (L) 07/22/2024 0811   ALBUMIN 4.0 07/22/2024 0811   AST 12 (L) 07/22/2024 0811   ALT 14 07/22/2024 0811   ALKPHOS 62 07/22/2024 0811   BILITOT 0.3 07/22/2024 0811   GFRNONAA >60 07/22/2024 0811   GFRAA >60 10/27/2019 1528   Lab Results  Component Value Date   WBC 5.2 07/22/2024   NEUTROABS 2.7 07/22/2024   HGB 12.4 07/22/2024   HCT 37.0 07/22/2024   MCV 87.5 07/22/2024   PLT 192 07/22/2024    Imaging:  MR BRAIN W WO CONTRAST Result Date: 07/29/2024 CLINICAL DATA:  Provided history: Meningioma. Brain/CNS neoplasm, assess treatment response. Additional history provided by the scanning technologist: History of breast cancer. EXAM: MRI HEAD WITHOUT AND WITH CONTRAST TECHNIQUE: Multiplanar, multiecho pulse sequences of the brain and surrounding structures were obtained without and with intravenous contrast. CONTRAST:  7.5 mL Vueway  intravenous contrast. COMPARISON:  Prior brain MRI examinations 08/04/2023 and earlier. FINDINGS: Brain: Cerebral volume is normal. 11 x 6 x 7 mm homogeneously enhancing extra-axial mass with broad dural interface at the anterior aspect of the right tentorium. This is unchanged in size as compared to the MRI of 08/04/2023 (remeasured on prior). As before, there is slight flattening of the right anterolateral aspect of the pons. Partially empty sella turcica. No cortical encephalomalacia is identified. No significant cerebral white matter disease. There is no acute infarct. No chronic intracranial blood products. No  extra-axial fluid collection. No midline shift. Vascular: Maintained flow voids within the proximal large arterial vessels. Skull and upper cervical spine: No focal worrisome marrow lesion. Sinuses/Orbits: No mass or acute finding within the imaged orbits. 12 mm mucous retention cyst within the right maxillary sinus. Mucous retention cysts within the left maxillary sinus measuring up to 9 mm. 11 mm mucous retention cyst within the left sphenoid sinus. IMPRESSION: 1. 11 x 7 mm anterior right tentorial meningioma, unchanged in size as compared to the MRI of 08/04/2023. 2. Paranasal sinus mucous retention cysts, as described. Electronically Signed   By: Rockey Childs D.O.   On: 07/29/2024 09:26    [facility] Clinician Interpretation: I have personally reviewed the radiological images as listed.  My interpretation, in the context of the patient's clinical presentation, is stable disease    Assessment/Plan Meningioma (HCC)  Evelene Roussin presents with clinical and radiographic syndrome consistent with skull base meningioma.  Current MRI study demonstrates stable findings when compared to 1 year prior.  We can compare this lesion to an axial FLAIR image from 2020 (scan done for left sided numbness); it was present at that time, dimension has increased by 1-36mm over the 3+ years.    Headaches are better controlled.  We ask that Megyn Leng return to clinic in 12 months following next brain MRI, or sooner as needed.  We appreciate the opportunity to participate in the care of Julya Cope.   All questions were answered. The patient knows to call the clinic with any problems, questions or concerns. No barriers to learning were detected.  The total time spent in the encounter was 40 minutes and more than 50% was on counseling and review of test results   Arthea MARLA Manns, MD Medical Director of Neuro-Oncology [facility] 08/05/24 9:49 AM

## 2024-08-10 ENCOUNTER — Telehealth: Payer: Self-pay | Admitting: Internal Medicine

## 2024-08-10 NOTE — Telephone Encounter (Signed)
 Scheduled appointment per 9/11 los. Talked with the patient and she is aware of the made appointments.

## 2024-08-11 ENCOUNTER — Other Ambulatory Visit: Payer: Self-pay

## 2024-08-12 ENCOUNTER — Inpatient Hospital Stay

## 2024-08-12 ENCOUNTER — Encounter: Payer: Self-pay | Admitting: Adult Health

## 2024-08-12 ENCOUNTER — Inpatient Hospital Stay (HOSPITAL_BASED_OUTPATIENT_CLINIC_OR_DEPARTMENT_OTHER): Admitting: Adult Health

## 2024-08-12 VITALS — BP 125/72 | HR 70 | Temp 97.9°F | Resp 16 | Wt 176.3 lb

## 2024-08-12 DIAGNOSIS — C787 Secondary malignant neoplasm of liver and intrahepatic bile duct: Secondary | ICD-10-CM | POA: Diagnosis not present

## 2024-08-12 DIAGNOSIS — D0511 Intraductal carcinoma in situ of right breast: Secondary | ICD-10-CM | POA: Diagnosis not present

## 2024-08-12 DIAGNOSIS — C50919 Malignant neoplasm of unspecified site of unspecified female breast: Secondary | ICD-10-CM

## 2024-08-12 LAB — CMP (CANCER CENTER ONLY)
ALT: 15 U/L (ref 0–44)
AST: 13 U/L — ABNORMAL LOW (ref 15–41)
Albumin: 4.1 g/dL (ref 3.5–5.0)
Alkaline Phosphatase: 64 U/L (ref 38–126)
Anion gap: 5 (ref 5–15)
BUN: 18 mg/dL (ref 6–20)
CO2: 28 mmol/L (ref 22–32)
Calcium: 8.9 mg/dL (ref 8.9–10.3)
Chloride: 105 mmol/L (ref 98–111)
Creatinine: 0.67 mg/dL (ref 0.44–1.00)
GFR, Estimated: >60 mL/min (ref >60)
Glucose, Bld: 101 mg/dL — ABNORMAL HIGH (ref 70–99)
Potassium: 4.1 mmol/L (ref 3.5–5.1)
Sodium: 138 mmol/L (ref 135–145)
Total Bilirubin: 0.4 mg/dL (ref 0.0–1.2)
Total Protein: 6.7 g/dL (ref 6.5–8.1)

## 2024-08-12 LAB — CBC WITH DIFFERENTIAL (CANCER CENTER ONLY)
Abs Immature Granulocytes: 0.01 K/uL (ref 0.00–0.07)
Basophils Absolute: 0 K/uL (ref 0.0–0.1)
Basophils Relative: 1 %
Eosinophils Absolute: 0.2 K/uL (ref 0.0–0.5)
Eosinophils Relative: 3 %
HCT: 37.4 % (ref 36.0–46.0)
Hemoglobin: 12.6 g/dL (ref 12.0–15.0)
Immature Granulocytes: 0 %
Lymphocytes Relative: 32 %
Lymphs Abs: 1.8 K/uL (ref 0.7–4.0)
MCH: 29.4 pg (ref 26.0–34.0)
MCHC: 33.7 g/dL (ref 30.0–36.0)
MCV: 87.4 fL (ref 80.0–100.0)
Monocytes Absolute: 0.5 K/uL (ref 0.1–1.0)
Monocytes Relative: 8 %
Neutro Abs: 3.3 K/uL (ref 1.7–7.7)
Neutrophils Relative %: 56 %
Platelet Count: 198 K/uL (ref 150–400)
RBC: 4.28 MIL/uL (ref 3.87–5.11)
RDW: 13.6 % (ref 11.5–15.5)
WBC Count: 5.8 K/uL (ref 4.0–10.5)
nRBC: 0 % (ref 0.0–0.2)

## 2024-08-12 MED ORDER — ACETAMINOPHEN 325 MG PO TABS
650.0000 mg | ORAL_TABLET | Freq: Once | ORAL | Status: AC
  Administered 2024-08-12: 650 mg via ORAL
  Filled 2024-08-12: qty 2

## 2024-08-12 MED ORDER — TRASTUZUMAB-ANNS CHEMO 150 MG IV SOLR
6.0000 mg/kg | Freq: Once | INTRAVENOUS | Status: AC
  Administered 2024-08-12: 483 mg via INTRAVENOUS
  Filled 2024-08-12: qty 23

## 2024-08-12 MED ORDER — DIPHENHYDRAMINE HCL 25 MG PO CAPS
50.0000 mg | ORAL_CAPSULE | Freq: Once | ORAL | Status: AC
  Administered 2024-08-12: 50 mg via ORAL
  Filled 2024-08-12: qty 2

## 2024-08-12 MED ORDER — SODIUM CHLORIDE 0.9 % IV SOLN: INTRAVENOUS | Status: DC

## 2024-08-12 MED ORDER — SODIUM CHLORIDE 0.9 % IV SOLN
420.0000 mg | Freq: Once | INTRAVENOUS | Status: AC
  Administered 2024-08-12: 420 mg via INTRAVENOUS
  Filled 2024-08-12: qty 14

## 2024-08-12 NOTE — Progress Notes (Signed)
 [facility] Cancer Follow up:    Joann Slater, MD [address] [place] [address]   DIAGNOSIS:  Cancer Staging  Breast cancer metastasized to liver [facility]) Staging form: Breast, AJCC 8th Edition - Clinical: Stage IV (cT46mi, cNX, cM1, G3, ER: Unknown, PR: Unknown, HER2: Unknown) - Signed by [name], MD on 02/06/2023 Histologic grading system: 3 grade system    SUMMARY OF ONCOLOGIC HISTORY: Oncology History  Breast cancer metastasized to liver (HCC)  01/25/2019 Initial Diagnosis   Screening mammogram showed bilateral calcifications right breast 5 mm, pleomorphic.  Left breast 1.6 cm punctate, smaller group of calcifications spanning 3 mm.  Right lower outer quadrant biopsy revealed low-grade DCIS ER 90%, PR 70%; biopsy of left breast calcifications ALH and PASH   01/31/2019 Genetic Testing   ATM c.8495G>A and RECQL4 c.2587G>A VUS identified on the 9-gene STAT panel and Multi-cancer panel through [facility].  The STAT Breast cancer panel offered by [facility] includes sequencing and rearrangement analysis for the following 9 genes:  ATM, BRCA1, BRCA2, CDH1, CHEK2, PALB2, PTEN, STK11 and TP53.   The Multi-Gene Panel offered by [facility] includes sequencing and/or deletion duplication testing of the following 84 genes: AIP, ALK, APC, ATM, AXIN2,BAP1,  BARD1, BLM, BMPR1A, BRCA1, BRCA2, BRIP1, CASR, CDC73, CDH1, CDK4, CDKN1B, CDKN1C, CDKN2A (p14ARF), CDKN2A (p16INK4a), CEBPA, CHEK2, CTNNA1, DICER1, DIS3L2, EGFR (c.2369C>T, p.Thr790Met variant only), EPCAM (Deletion/duplication testing only), FH, FLCN, GATA2, GPC3, GREM1 (Promoter region deletion/duplication testing only), HOXB13 (c.251G>A, p.Gly84Glu), HRAS, KIT, MAX, MEN1, MET, MITF (c.952G>A, p.Glu318Lys variant only), MLH1, MSH2, MSH3, MSH6, MUTYH, NBN, NF1, NF2, NTHL1, PALB2, PDGFRA, PHOX2B, PMS2, POLD1, POLE, POT1, PRKAR1A, PTCH1, PTEN, RAD50, RAD51C, RAD51D, RB1, RECQL4, RET, RUNX1, SDHAF2, SDHA (sequence changes  only), SDHB, SDHC, SDHD, SMAD4, SMARCA4, SMARCB1, SMARCE1, STK11, SUFU, TERC, TERT, TMEM127, TP53, TSC1, TSC2, VHL, WRN and WT1.  he report date is February 02, 2019.   02/11/2019 Surgery   RT lumpectomy: DICS Intermediate grade, Margins Neg, ER 90%, PR 70%; Lt Lumpectomy: UDH   02/26/2019 -  Anti-estrogen oral therapy   Tamoxifen  20mg  daily, stopped because of depression and facial numbness   05/20/2019 - 06/17/2019 Radiation Therapy   Adjuvant XRT   08/15/2021 Relapse/Recurrence   MRI surveillance detected Right breast calcifications 5 cm, Ant biopsy: IG DCIS with necrosis, ER 40%, PR 0%, Posterior biopsy: Intermediate grade DCIS with [facility]   08/19/2022 Cancer Staging   Staging form: Breast, AJCC 8th Edition - Clinical: Stage IV (cT57mi, cNX, cM1, G3, ER: Unknown, PR: Unknown, HER2: Unknown) - Signed by [name], MD on 02/06/2023 Histologic grading system: 3 grade system   02/14/2023 - 01/15/2024 Chemotherapy   Patient is on Treatment Plan : BREAST DOCEtaxel  + Trastuzumab  + Pertuzumab  (THP) q21d x 8 cycles / Trastuzumab  + Pertuzumab  q21d x 4 cycles     02/05/2024 -  Chemotherapy   Patient is on Treatment Plan : BREAST Trastuzumab   + Pertuzumab  q21d x 13 cycles     AML (acute myeloid leukemia) (HCC)  09/1998 Initial Diagnosis   AML (acute myeloid leukemia) (HCC) treated with 2 induction regimens followed by 6 consolidation regimens, remission     CURRENT THERAPY:  INTERVAL HISTORY:  Discussed the use of AI scribe software for clinical note transcription with the patient, who gave verbal consent to proceed.  History of Present Illness Joann Meadows is a 48 year old female with metastatic breast cancer who presents for treatment with Herceptin  and Perjeta .  She is currently undergoing treatment with  Herceptin  and Perjeta , with her next dose scheduled for today. Her most recent echocardiogram on May 21, 2024, showed a left ventricular ejection fraction of 50-55% with abnormal global  longitudinal strain. She is scheduled for a follow-up echocardiogram on September 08, 2024, and a restaging PET scan on August 19, 2024.  She experiences occasional chills and has changes in her menstrual cycle, specifically skipping her period this month, which has been frequent over the past 7-8 months. There is a change in her bowel habits since last year, requiring more frequent bathroom visits, attributed to her current treatment regimen.  No new headaches, fever, cough, or shortness of breath. Her recent CBC was normal, and she is awaiting results for kidney and liver function tests.    Patient Active Problem List   Diagnosis Date Noted   Other specified disorders of eustachian tube, right ear 09/05/2023   Chronic rhinitis 09/05/2023   Swimmer's ear of right side 09/03/2023   Port-A-Cath in place 03/07/2023   Meningioma (HCC) 01/30/2023   Liver lesion 01/23/2023   COPD possible, would be Gold Group B 11/25/2019   DOE (dyspnea on exertion) 11/24/2019   Encounter for antineoplastic chemotherapy 02/02/2019   Breast cancer metastasized to liver (HCC) 01/25/2019   AML (acute myeloid leukemia) (HCC) 01/25/2019   Family history of colon cancer    Family history of leukemia    Delivery normal 03/25/2017    is allergic to levaquin  [levofloxacin ], tamoxifen  citrate, and tape.  MEDICAL HISTORY: Past Medical History:  Diagnosis Date   Breast cancer (HCC) 2020   Right Breast Cancer   Endometriosis    Fibroid    Hx of radiation therapy 06/2019   Leukemia (HCC)    20 years ago   Personal history of radiation therapy 2020   Right Breast Cancer   PONV (postoperative nausea and vomiting)    pt states she vomits after colonoscopies    SURGICAL HISTORY: Past Surgical History:  Procedure Laterality Date   BREAST EXCISIONAL BIOPSY Left 02/11/2019   [facility]   BREAST LUMPECTOMY Right 02/11/2019   BREAST LUMPECTOMY WITH RADIOACTIVE SEED LOCALIZATION Bilateral 02/11/2019   Procedure:  BILATERAL BREAST LUMPECTOMIES WITH BILATERAL RADIOACTIVE SEED LOCALIZATION;  Surgeon: Belinda Cough, MD;  Location: [facility];  Service: General;  Laterality: Bilateral;   IR IMAGING GUIDED PORT INSERTION  02/25/2023   IR RADIOLOGIST EVAL & MGMT  02/27/2023   IR RADIOLOGIST EVAL & MGMT  01/22/2024   IR RADIOLOGIST EVAL & MGMT  01/29/2024   MASTECTOMY Right 11/2021    SOCIAL HISTORY: Social History   Socioeconomic History   Marital status: Single    Spouse name: Not on file   Number of children: 2   Years of education: Not on file   Highest education level: High school graduate  Occupational History   Not on file  Tobacco Use   Smoking status: Former    Current packs/day: 0.00    Types: Cigarettes    Start date: 01/24/2005    Quit date: 01/25/2015    Years since quitting: 9.5   Smokeless tobacco: Never  Vaping Use   Vaping status: Never Used  Substance and Sexual Activity   Alcohol use: No   Drug use: No   Sexual activity: Yes    Birth control/protection: None  Other Topics Concern   Not on file  Social History Narrative   Lives with children   Social Drivers of Health   Financial Resource Strain: Not on file  Food  Insecurity: No Food Insecurity (10/03/2022)   Hunger Vital Sign    Worried About Running Out of Food in the Last Year: Never true    Ran Out of Food in the Last Year: Never true  Transportation Needs: No Transportation Needs (10/03/2022)   PRAPARE - Administrator, Civil Service (Medical): No    Lack of Transportation (Non-Medical): No  Physical Activity: Not on file  Stress: Not on file  Social Connections: Not on file  Intimate Partner Violence: Not At Risk (02/26/2019)   Humiliation, Afraid, Rape, and Kick questionnaire    Fear of Current or Ex-Partner: No    Emotionally Abused: No    Physically Abused: No    Sexually Abused: No    FAMILY HISTORY: Family History  Problem Relation Age of Onset   Colon cancer Mother 54       d.  32   Hypertension Father    Diabetes Maternal Aunt    Diabetes Maternal Uncle    Mental retardation Maternal Uncle    Diabetes Paternal Aunt    Diabetes Maternal Grandmother    Stroke Paternal Grandmother        c. 109   Leukemia Paternal Grandfather        d. 43; ?CLL   Leukemia Cousin 4       mat first cousin   Lymphoma Cousin 40       pat first cousin    Review of Systems  Constitutional:  Negative for appetite change, chills, fatigue, fever and unexpected weight change.  HENT:   Negative for hearing loss, lump/mass and trouble swallowing.   Eyes:  Negative for eye problems and icterus.  Respiratory:  Negative for chest tightness, cough and shortness of breath.   Cardiovascular:  Negative for chest pain, leg swelling and palpitations.  Gastrointestinal:  Negative for abdominal distention, abdominal pain, constipation, diarrhea, nausea and vomiting.  Endocrine: Negative for hot flashes.  Genitourinary:  Negative for difficulty urinating.   Musculoskeletal:  Negative for arthralgias.  Skin:  Negative for itching and rash.  Neurological:  Negative for dizziness, extremity weakness, headaches and numbness.  Hematological:  Negative for adenopathy. Does not bruise/bleed easily.  Psychiatric/Behavioral:  Negative for depression. The patient is not nervous/anxious.       PHYSICAL EXAMINATION     Vitals:   08/12/24 0840  BP: 125/72  Pulse: 70  Resp: 16  Temp: 97.9 F (36.6 C)  SpO2: 100%    Physical Exam Constitutional:      General: She is not in acute distress.    Appearance: Normal appearance. She is not toxic-appearing.  HENT:     Head: Normocephalic and atraumatic.     Mouth/Throat:     Mouth: Mucous membranes are moist.     Pharynx: Oropharynx is clear. No oropharyngeal exudate or posterior oropharyngeal erythema.  Eyes:     General: No scleral icterus. Cardiovascular:     Rate and Rhythm: Normal rate and regular rhythm.     Pulses: Normal pulses.      Heart sounds: Normal heart sounds.  Pulmonary:     Effort: Pulmonary effort is normal.     Breath sounds: Normal breath sounds.  Abdominal:     General: Abdomen is flat. Bowel sounds are normal. There is no distension.     Palpations: Abdomen is soft.     Tenderness: There is no abdominal tenderness.  Musculoskeletal:        General: No swelling.  Cervical back: Neck supple.  Lymphadenopathy:     Cervical: No cervical adenopathy.  Skin:    General: Skin is warm and dry.     Findings: No rash.  Neurological:     General: No focal deficit present.     Mental Status: She is alert.  Psychiatric:        Mood and Affect: Mood normal.        Behavior: Behavior normal.     LABORATORY DATA:  CBC    Component Value Date/Time   WBC 5.8 08/12/2024 0759   WBC 2.7 (L) 02/26/2023 0001   RBC 4.28 08/12/2024 0759   HGB 12.6 08/12/2024 0759   HCT 37.4 08/12/2024 0759   PLT 198 08/12/2024 0759   MCV 87.4 08/12/2024 0759   MCH 29.4 08/12/2024 0759   MCHC 33.7 08/12/2024 0759   RDW 13.6 08/12/2024 0759   LYMPHSABS 1.8 08/12/2024 0759   MONOABS 0.5 08/12/2024 0759   EOSABS 0.2 08/12/2024 0759   BASOSABS 0.0 08/12/2024 0759    CMP     Component Value Date/Time   NA 138 08/12/2024 0759   K 4.1 08/12/2024 0759   CL 105 08/12/2024 0759   CO2 28 08/12/2024 0759   GLUCOSE 101 (H) 08/12/2024 0759   BUN 18 08/12/2024 0759   CREATININE 0.67 08/12/2024 0759   CALCIUM 8.9 08/12/2024 0759   PROT 6.7 08/12/2024 0759   ALBUMIN 4.1 08/12/2024 0759   AST 13 (L) 08/12/2024 0759   ALT 15 08/12/2024 0759   ALKPHOS 64 08/12/2024 0759   BILITOT 0.4 08/12/2024 0759   GFRNONAA >60 08/12/2024 0759   GFRAA >60 10/27/2019 1528     ASSESSMENT and THERAPY PLAN:   Breast cancer metastasized to liver (HCC) 02/11/19: RT lumpectomy: DICS Intermediate grade, Margins Neg, ER 90%, PR 70%; Lt Lumpectomy: UDH status post radiation, could not tolerate tamoxifen  recurrence: 08/15/2021:MRI surveillance  detected Right breast calcifications 5 cm, Ant biopsy: IG DCIS with necrosis, ER 40%, PR 0%, Posterior biopsy: Intermediate grade DCIS with [facility]  11/29/2021: Right mastectomy: High-grade DCIS with microinvasion, ER 0%, PR 0%, HER2 positive, 0/1 lymph node negative  T1c MIC, N0: Stage Ia   Patient had second opinion at [facility] with Dr. Eleanora who agreed with her plan of no systemic chemotherapy   Breast cancer surveillance: 1.  Mammogram 08/22/2022: Left breast benign breast density category B  2.  CT CAP 12/09/2022: Multiple liver lesions indicative of cysts and hemangiomas new right hepatic lobe lesion favored to be hemangioma otherwise no evidence of metastatic disease.  Radiology recommended an MRI of the liver. 3.  Liver MRI 01/08/2023: 2 New rim-enhancing lesions 2.3 cm (was 1.6 cm on 12/09/2022), 1.8 cm (measured 1.5 cm), benign hemangioma 2.4 cm and 1.9 cm and 1.3 cm unchanged 4.  PET CT scan 01/22/2023: Liver: 2.5 cm and 2.4 cm lesions ------------------------------------------------------------------------------------------------------------------------------------------------- 01/31/2023: Liver lesion biopsy: Metastatic adenocarcinoma to the liver consistent with breast primary positive for CK7 and GATA3, ER 0%, PR 0%, Ki-67 25%, HER2 3+ positive, [facility] molecular testing 02/21/2023: ER negative PR negative, HER2 amplified, ER positive TMB 4, BRCA negative, ESR 1 unmutated, PD-L1 negative, CPS: 1, PTEN IHC +2+ but mutation not detected   Current treatment: Completed 6 cycles of Taxotere  Herceptin  Perjeta  on 05/30/2023, currently on maintenance Herceptin  and Perjeta  (now being given intravenously because of skin irritation from injections)   Herceptin  Perjeta  toxicities:  Cardiomyopathy: Fatigue and palpitations on Entresto  and metoprolol .  Follows cardiology Meningioma: Follows with Dr. Buckley,  brain MRI  08/04/2023: Stable, new brain MRI has been ordered   PET/CT: 06/19/2023: Interval resolution of the  hypermetabolic lesions in the liver. PET/CT 10/03/2023: No evidence of recurrent/metastatic cancer low-density liver lesions less conspicuous without any hypermetabolic activity 01/08/2024: CT CAP: Stable liver lesions 2.2 cm, 2 cm.  No new lesions.  Stable bilateral pulmonary nodules (considered benign) 02/11/2024: PET/CT scan: No definite evidence of hypermetabolic metastatic disease.  Equivocal small focus of metabolic activity dome of right hepatic lobe corresponding to the MRI finding.  Remains indeterminate (suggest liver MRI in 3 to 6 months), stable lung nodules MRI 05/05/2024: stable small hypervascular liver lesions CT CAP 05/17/24: Small lung nodules unchanged, benign liver cysts, no lymphadenopathy or met disease    Assessment and Plan Assessment & Plan Metastatic breast cancer Managed with Herceptin  and Perjeta . Echocardiogram showed LVEF 50-55% with abnormal global longitudinal strain. Cleared to continue treatment by cardiology. Reports occasional chills and menstrual cycle changes. CBC normal, awaiting kidney and liver function tests. - Administer Herceptin  and Perjeta  as scheduled. - Schedule repeat echocardiogram on September 08, 2024. - Order PET scan on August 19, 2024 for restaging.    RTC in 3 weeks for labs, f/u and treatment.        All questions were answered. The patient knows to call the clinic with any problems, questions or concerns. We can certainly see the patient much sooner if necessary.  Total encounter time:20 minutes*in face-to-face visit time, chart review, lab review, care coordination, order entry, and documentation of the encounter time.    Morna Kendall, NP 08/17/24 1:16 PM Medical Oncology and Hematology [facility] [address] [place], [place] [address] Tel. [phone]    Fax. [phone]  *Total Encounter Time as defined by the Centers for Medicare and Medicaid Services includes, in addition to the face-to-face time of a  patient visit (documented in the note above) non-face-to-face time: obtaining and reviewing outside history, ordering and reviewing medications, tests or procedures, care coordination (communications with other health care professionals or caregivers) and documentation in the medical record.

## 2024-08-12 NOTE — Patient Instructions (Signed)
 [facility] - A DEPT OF [facility]  Discharge Instructions: Thank you for choosing [facility] to provide your oncology and hematology care.   If you have a lab appointment with the Cancer Center, please go directly to the Cancer Center and check in at the registration area.   Wear comfortable clothing and clothing appropriate for easy access to any Portacath or PICC line.   We strive to give you quality time with your provider. You may need to reschedule your appointment if you arrive late (15 or more minutes).  Arriving late affects you and other patients whose appointments are after yours.  Also, if you miss three or more appointments without notifying the office, you may be dismissed from the clinic at the provider's discretion.      For prescription refill requests, have your pharmacy contact our office and allow 72 hours for refills to be completed.    Today you received the following chemotherapy and/or immunotherapy agents: trastuzumab  and pertuzumab       To help prevent nausea and vomiting after your treatment, we encourage you to take your nausea medication as directed.  BELOW ARE SYMPTOMS THAT SHOULD BE REPORTED IMMEDIATELY: *FEVER GREATER THAN 100.4 F (38 C) OR HIGHER *CHILLS OR SWEATING *NAUSEA AND VOMITING THAT IS NOT CONTROLLED WITH YOUR NAUSEA MEDICATION *UNUSUAL SHORTNESS OF BREATH *UNUSUAL BRUISING OR BLEEDING *URINARY PROBLEMS (pain or burning when urinating, or frequent urination) *BOWEL PROBLEMS (unusual diarrhea, constipation, pain near the anus) TENDERNESS IN MOUTH AND THROAT WITH OR WITHOUT PRESENCE OF ULCERS (sore throat, sores in mouth, or a toothache) UNUSUAL RASH, SWELLING OR PAIN  UNUSUAL VAGINAL DISCHARGE OR ITCHING   Items with * indicate a potential emergency and should be followed up as soon as possible or go to the Emergency Department if any problems should occur.  Please show the CHEMOTHERAPY ALERT CARD  or IMMUNOTHERAPY ALERT CARD at check-in to the Emergency Department and triage nurse.  Should you have questions after your visit or need to cancel or reschedule your appointment, please contact [facility] - A DEPT OF [facility]  Dept: [phone]  and follow the prompts.  Office hours are 8:00 a.m. to 4:30 p.m. Monday - Friday. Please note that voicemails left after 4:00 p.m. may not be returned until the following business day.  We are closed weekends and major holidays. You have access to a nurse at all times for urgent questions. Please call the main number to the clinic Dept: [phone] and follow the prompts.   For any non-urgent questions, you may also contact your provider using MyChart. We now offer e-Visits for anyone 30 and older to request care online for non-urgent symptoms. For details visit [email or web address].   Also download the MyChart app! Go to the app store, search "MyChart", open the app, select [facility], and log in with your MyChart username and password.

## 2024-08-13 LAB — CANCER ANTIGEN 27.29: CA 27.29: 16.6 U/mL (ref 0.0–38.6)

## 2024-08-16 DIAGNOSIS — Z9011 Acquired absence of right breast and nipple: Secondary | ICD-10-CM | POA: Diagnosis not present

## 2024-08-17 ENCOUNTER — Encounter: Payer: Self-pay | Admitting: Hematology and Oncology

## 2024-08-17 NOTE — Assessment & Plan Note (Signed)
 02/11/19: RT lumpectomy: DICS Intermediate grade, Margins Neg, ER 90%, PR 70%; Lt Lumpectomy: UDH status post radiation, could not tolerate tamoxifen  recurrence: 08/15/2021:MRI surveillance detected Right breast calcifications 5 cm, Ant biopsy: IG DCIS with necrosis, ER 40%, PR 0%, Posterior biopsy: Intermediate grade DCIS with [facility]  11/29/2021: Right mastectomy: High-grade DCIS with microinvasion, ER 0%, PR 0%, HER2 positive, 0/1 lymph node negative  T1c MIC, N0: Stage Ia   Patient had second opinion at [facility] with Dr. Eleanora who agreed with her plan of no systemic chemotherapy   Breast cancer surveillance: 1.  Mammogram 08/22/2022: Left breast benign breast density category B  2.  CT CAP 12/09/2022: Multiple liver lesions indicative of cysts and hemangiomas new right hepatic lobe lesion favored to be hemangioma otherwise no evidence of metastatic disease.  Radiology recommended an MRI of the liver. 3.  Liver MRI 01/08/2023: 2 New rim-enhancing lesions 2.3 cm (was 1.6 cm on 12/09/2022), 1.8 cm (measured 1.5 cm), benign hemangioma 2.4 cm and 1.9 cm and 1.3 cm unchanged 4.  PET CT scan 01/22/2023: Liver: 2.5 cm and 2.4 cm lesions ------------------------------------------------------------------------------------------------------------------------------------------------- 01/31/2023: Liver lesion biopsy: Metastatic adenocarcinoma to the liver consistent with breast primary positive for CK7 and GATA3, ER 0%, PR 0%, Ki-67 25%, HER2 3+ positive, [facility] molecular testing 02/21/2023: ER negative PR negative, HER2 amplified, ER positive TMB 4, BRCA negative, ESR 1 unmutated, PD-L1 negative, CPS: 1, PTEN IHC +2+ but mutation not detected   Current treatment: Completed 6 cycles of Taxotere  Herceptin  Perjeta  on 05/30/2023, currently on maintenance Herceptin  and Perjeta  (now being given intravenously because of skin irritation from injections)   Herceptin  Perjeta  toxicities:  Cardiomyopathy: Fatigue and palpitations on  Entresto  and metoprolol .  Follows cardiology Meningioma: Follows with Dr. Buckley, brain MRI  08/04/2023: Stable, new brain MRI has been ordered   PET/CT: 06/19/2023: Interval resolution of the hypermetabolic lesions in the liver. PET/CT 10/03/2023: No evidence of recurrent/metastatic cancer low-density liver lesions less conspicuous without any hypermetabolic activity 01/08/2024: CT CAP: Stable liver lesions 2.2 cm, 2 cm.  No new lesions.  Stable bilateral pulmonary nodules (considered benign) 02/11/2024: PET/CT scan: No definite evidence of hypermetabolic metastatic disease.  Equivocal small focus of metabolic activity dome of right hepatic lobe corresponding to the MRI finding.  Remains indeterminate (suggest liver MRI in 3 to 6 months), stable lung nodules MRI 05/05/2024: stable small hypervascular liver lesions CT CAP 05/17/24: Small lung nodules unchanged, benign liver cysts, no lymphadenopathy or met disease    Assessment and Plan Assessment & Plan Metastatic breast cancer Managed with Herceptin  and Perjeta . Echocardiogram showed LVEF 50-55% with abnormal global longitudinal strain. Cleared to continue treatment by cardiology. Reports occasional chills and menstrual cycle changes. CBC normal, awaiting kidney and liver function tests. - Administer Herceptin  and Perjeta  as scheduled. - Schedule repeat echocardiogram on September 08, 2024. - Order PET scan on August 19, 2024 for restaging.    RTC in 3 weeks for labs, f/u and treatment.

## 2024-08-19 ENCOUNTER — Encounter (HOSPITAL_COMMUNITY)
Admission: RE | Admit: 2024-08-19 | Discharge: 2024-08-19 | Disposition: A | Discharge status: Home / Self Care | Source: Ambulatory Visit | Attending: Hematology and Oncology | Admitting: Hematology and Oncology

## 2024-08-19 DIAGNOSIS — C50919 Malignant neoplasm of unspecified site of unspecified female breast: Secondary | ICD-10-CM | POA: Diagnosis not present

## 2024-08-19 DIAGNOSIS — C787 Secondary malignant neoplasm of liver and intrahepatic bile duct: Secondary | ICD-10-CM | POA: Insufficient documentation

## 2024-08-19 DIAGNOSIS — R918 Other nonspecific abnormal finding of lung field: Secondary | ICD-10-CM | POA: Diagnosis not present

## 2024-08-19 LAB — GLUCOSE, CAPILLARY: Glucose-Capillary: 94 mg/dL (ref 70–99)

## 2024-08-19 MED ORDER — FLUDEOXYGLUCOSE F - 18 (FDG) INJECTION
8.8100 | Freq: Once | INTRAVENOUS | Status: AC
  Administered 2024-08-19: 8.81 via INTRAVENOUS

## 2024-08-20 ENCOUNTER — Other Ambulatory Visit (HOSPITAL_COMMUNITY)

## 2024-08-20 ENCOUNTER — Encounter (HOSPITAL_COMMUNITY): Admitting: Internal Medicine

## 2024-08-23 ENCOUNTER — Telehealth: Payer: Self-pay

## 2024-08-23 ENCOUNTER — Inpatient Hospital Stay (HOSPITAL_BASED_OUTPATIENT_CLINIC_OR_DEPARTMENT_OTHER): Admitting: Hematology and Oncology

## 2024-08-23 DIAGNOSIS — H2513 Age-related nuclear cataract, bilateral: Secondary | ICD-10-CM | POA: Diagnosis not present

## 2024-08-23 DIAGNOSIS — C50919 Malignant neoplasm of unspecified site of unspecified female breast: Secondary | ICD-10-CM

## 2024-08-23 DIAGNOSIS — C787 Secondary malignant neoplasm of liver and intrahepatic bile duct: Secondary | ICD-10-CM | POA: Diagnosis not present

## 2024-08-23 DIAGNOSIS — Z170 Estrogen receptor positive status [ER+]: Secondary | ICD-10-CM

## 2024-08-23 DIAGNOSIS — H0102A Squamous blepharitis right eye, upper and lower eyelids: Secondary | ICD-10-CM | POA: Diagnosis not present

## 2024-08-23 DIAGNOSIS — Z923 Personal history of irradiation: Secondary | ICD-10-CM

## 2024-08-23 DIAGNOSIS — H04123 Dry eye syndrome of bilateral lacrimal glands: Secondary | ICD-10-CM | POA: Diagnosis not present

## 2024-08-23 DIAGNOSIS — D0511 Intraductal carcinoma in situ of right breast: Secondary | ICD-10-CM

## 2024-08-23 DIAGNOSIS — Z79899 Other long term (current) drug therapy: Secondary | ICD-10-CM

## 2024-08-23 DIAGNOSIS — H18832 Recurrent erosion of cornea, left eye: Secondary | ICD-10-CM | POA: Diagnosis not present

## 2024-08-23 NOTE — Telephone Encounter (Signed)
 Received confirmation of successful fax transmission of PET scan results to patient's ob/gyn at patient's request.

## 2024-08-23 NOTE — Assessment & Plan Note (Signed)
 02/11/19: RT lumpectomy: DICS Intermediate grade, Margins Neg, ER 90%, PR 70%; Lt Lumpectomy: UDH status post radiation, could not tolerate tamoxifen  recurrence: 08/15/2021:MRI surveillance detected Right breast calcifications 5 cm, Ant biopsy: IG DCIS with necrosis, ER 40%, PR 0%, Posterior biopsy: Intermediate grade DCIS with [facility]  11/29/2021: Right mastectomy: High-grade DCIS with microinvasion, ER 0%, PR 0%, HER2 positive, 0/1 lymph node negative  T1c MIC, N0: Stage Ia   Patient had second opinion at [facility] with Dr. Eleanora who agreed with her plan of no systemic chemotherapy   Breast cancer surveillance: 1.  Mammogram 08/22/2022: Left breast benign breast density category B  2.  CT CAP 12/09/2022: Multiple liver lesions indicative of cysts and hemangiomas new right hepatic lobe lesion favored to be hemangioma otherwise no evidence of metastatic disease.  Radiology recommended an MRI of the liver. 3.  Liver MRI 01/08/2023: 2 New rim-enhancing lesions 2.3 cm (was 1.6 cm on 12/09/2022), 1.8 cm (measured 1.5 cm), benign hemangioma 2.4 cm and 1.9 cm and 1.3 cm unchanged 4.  PET CT scan 01/22/2023: Liver: 2.5 cm and 2.4 cm lesions ------------------------------------------------------------------------------------------------------------------------------------------------- 01/31/2023: Liver lesion biopsy: Metastatic adenocarcinoma to the liver consistent with breast primary positive for CK7 and GATA3, ER 0%, PR 0%, Ki-67 25%, HER2 3+ positive, [facility] molecular testing 02/21/2023: ER negative PR negative, HER2 amplified, ER positive TMB 4, BRCA negative, ESR 1 unmutated, PD-L1 negative, CPS: 1, PTEN IHC +2+ but mutation not detected   Current treatment: Completed 6 cycles of Taxotere  Herceptin  Perjeta  on 05/30/2023, currently on maintenance Herceptin  and Perjeta  (now being given intravenously because of skin irritation from injections)   Herceptin  Perjeta  toxicities:  Cardiomyopathy: Fatigue and palpitations on  Entresto  and metoprolol .  Follows cardiology Meningioma: Follows with Dr. Buckley, brain MRI  08/04/2023: Stable, new brain MRI has been ordered    MRI 05/05/2024: stable small hypervascular liver lesions CT CAP 05/17/24: Small lung nodules unchanged, benign liver cysts, no lymphadenopathy or met disease PET/CT 08/20/2024: Previous liver lesion not conspicuous.  New focal activity right lower quadrant distal ileal small bowel loop indeterminate.  Stable lung nodules without PET uptake  Radiology counseling: Responding very well to current treatment.  Will refer to gastroenterology for the ileal lesion.  Return to clinic every 3 weeks for Herceptin  and Perjeta 

## 2024-08-23 NOTE — Progress Notes (Signed)
 HEMATOLOGY-ONCOLOGY TELEPHONE VISIT PROGRESS NOTE  I connected with our patient on 08/23/24 at  8:30 AM EDT by telephone and verified that I am speaking with the correct person using two identifiers.  I discussed the limitations, risks, security and privacy concerns of performing an evaluation and management service by telephone and the availability of in person appointments.  I also discussed with the patient that there may be a patient responsible charge related to this service. The patient expressed understanding and agreed to proceed.   History of Present Illness: Telephone follow-up to discuss the PET/CT result  History of Present Illness  Joann Meadows is a 48 year old female who presents for follow-up of her PET scan results.  The recent PET scan shows resolution of a previously noted spot on the liver. There is indeterminate activity in the ileum, which may represent normal bowel activity. Small lung nodules appear stable. There is increased activity in a fibroid in the uterus and in the left ovary. She undergoes colonoscopies every five years and has a gynecologist and a gastroenterologist.    Oncology History  Breast cancer metastasized to liver (HCC)  01/25/2019 Initial Diagnosis   Screening mammogram showed bilateral calcifications right breast 5 mm, pleomorphic.  Left breast 1.6 cm punctate, smaller group of calcifications spanning 3 mm.  Right lower outer quadrant biopsy revealed low-grade DCIS ER 90%, PR 70%; biopsy of left breast calcifications ALH and PASH   01/31/2019 Genetic Testing   ATM c.8495G>A and RECQL4 c.2587G>A VUS identified on the 9-gene STAT panel and Multi-cancer panel through [facility].  The STAT Breast cancer panel offered by [facility] includes sequencing and rearrangement analysis for the following 9 genes:  ATM, BRCA1, BRCA2, CDH1, CHEK2, PALB2, PTEN, STK11 and TP53.   The Multi-Gene Panel offered by [facility] includes sequencing and/or deletion duplication testing of  the following 84 genes: AIP, ALK, APC, ATM, AXIN2,BAP1,  BARD1, BLM, BMPR1A, BRCA1, BRCA2, BRIP1, CASR, CDC73, CDH1, CDK4, CDKN1B, CDKN1C, CDKN2A (p14ARF), CDKN2A (p16INK4a), CEBPA, CHEK2, CTNNA1, DICER1, DIS3L2, EGFR (c.2369C>T, p.Thr790Met variant only), EPCAM (Deletion/duplication testing only), FH, FLCN, GATA2, GPC3, GREM1 (Promoter region deletion/duplication testing only), HOXB13 (c.251G>A, p.Gly84Glu), HRAS, KIT, MAX, MEN1, MET, MITF (c.952G>A, p.Glu318Lys variant only), MLH1, MSH2, MSH3, MSH6, MUTYH, NBN, NF1, NF2, NTHL1, PALB2, PDGFRA, PHOX2B, PMS2, POLD1, POLE, POT1, PRKAR1A, PTCH1, PTEN, RAD50, RAD51C, RAD51D, RB1, RECQL4, RET, RUNX1, SDHAF2, SDHA (sequence changes only), SDHB, SDHC, SDHD, SMAD4, SMARCA4, SMARCB1, SMARCE1, STK11, SUFU, TERC, TERT, TMEM127, TP53, TSC1, TSC2, VHL, WRN and WT1.  he report date is February 02, 2019.   02/11/2019 Surgery   RT lumpectomy: DICS Intermediate grade, Margins Neg, ER 90%, PR 70%; Lt Lumpectomy: UDH   02/26/2019 -  Anti-estrogen oral therapy   Tamoxifen  20mg  daily, stopped because of depression and facial numbness   05/20/2019 - 06/17/2019 Radiation Therapy   Adjuvant XRT   08/15/2021 Relapse/Recurrence   MRI surveillance detected Right breast calcifications 5 cm, Ant biopsy: IG DCIS with necrosis, ER 40%, PR 0%, Posterior biopsy: Intermediate grade DCIS with [facility]   08/19/2022 Cancer Staging   Staging form: Breast, AJCC 8th Edition - Clinical: Stage IV (cT69mi, cNX, cM1, G3, ER: Unknown, PR: Unknown, HER2: Unknown) - Signed by [name], MD on 02/06/2023 Histologic grading system: 3 grade system   02/14/2023 - 01/15/2024 Chemotherapy   Patient is on Treatment Plan : BREAST DOCEtaxel  + Trastuzumab  + Pertuzumab  (THP) q21d x 8 cycles / Trastuzumab  + Pertuzumab  q21d x 4 cycles     02/05/2024 -  Chemotherapy  Patient is on Treatment Plan : BREAST Trastuzumab   + Pertuzumab  q21d x 13 cycles     AML (acute myeloid leukemia) (HCC)  09/1998 Initial Diagnosis    AML (acute myeloid leukemia) (HCC) treated with 2 induction regimens followed by 6 consolidation regimens, remission     REVIEW OF SYSTEMS:   Constitutional: Denies fevers, chills or abnormal weight loss All other systems were reviewed with the patient and are negative. Observations/Objective:     Assessment Plan:  Breast cancer metastasized to liver (HCC) 02/11/19: RT lumpectomy: DICS Intermediate grade, Margins Neg, ER 90%, PR 70%; Lt Lumpectomy: UDH status post radiation, could not tolerate tamoxifen  recurrence: 08/15/2021:MRI surveillance detected Right breast calcifications 5 cm, Ant biopsy: IG DCIS with necrosis, ER 40%, PR 0%, Posterior biopsy: Intermediate grade DCIS with [facility]  11/29/2021: Right mastectomy: High-grade DCIS with microinvasion, ER 0%, PR 0%, HER2 positive, 0/1 lymph node negative  T1c MIC, N0: Stage Ia   Patient had second opinion at [facility] with Dr. Eleanora who agreed with her plan of no systemic chemotherapy   Breast cancer surveillance: 1.  Mammogram 08/22/2022: Left breast benign breast density category B  2.  CT CAP 12/09/2022: Multiple liver lesions indicative of cysts and hemangiomas new right hepatic lobe lesion favored to be hemangioma otherwise no evidence of metastatic disease.  Radiology recommended an MRI of the liver. 3.  Liver MRI 01/08/2023: 2 New rim-enhancing lesions 2.3 cm (was 1.6 cm on 12/09/2022), 1.8 cm (measured 1.5 cm), benign hemangioma 2.4 cm and 1.9 cm and 1.3 cm unchanged 4.  PET CT scan 01/22/2023: Liver: 2.5 cm and 2.4 cm lesions ------------------------------------------------------------------------------------------------------------------------------------------------- 01/31/2023: Liver lesion biopsy: Metastatic adenocarcinoma to the liver consistent with breast primary positive for CK7 and GATA3, ER 0%, PR 0%, Ki-67 25%, HER2 3+ positive, [facility] molecular testing 02/21/2023: ER negative PR negative, HER2 amplified, ER positive TMB 4, BRCA negative,  ESR 1 unmutated, PD-L1 negative, CPS: 1, PTEN IHC +2+ but mutation not detected   Current treatment: Completed 6 cycles of Taxotere  Herceptin  Perjeta  on 05/30/2023, currently on maintenance Herceptin  and Perjeta  (now being given intravenously because of skin irritation from injections)   Herceptin  Perjeta  toxicities:  Cardiomyopathy: Fatigue and palpitations on Entresto  and metoprolol .  Follows cardiology Meningioma: Follows with Dr. Buckley, brain MRI  08/04/2023: Stable, new brain MRI has been ordered    MRI 05/05/2024: stable small hypervascular liver lesions CT CAP 05/17/24: Small lung nodules unchanged, benign liver cysts, no lymphadenopathy or met disease PET/CT 08/20/2024: Previous liver lesion not conspicuous.  New focal activity right lower quadrant distal ileal small bowel loop indeterminate.  Stable lung nodules without PET uptake  Radiology counseling: Responding very well to current treatment.   Patient will call her gastroenterologist for the ileal lesion. She will call her gynecologist to talk about the ovarian activity and the fibroids.  Return to clinic every 3 weeks for Herceptin  and Perjeta    Assessment & Plan Metastatic breast cancer with previously resolved liver metastasis PET scan shows no visible liver metastasis, indicating positive treatment response. Liver appears normal, though microscopic disease cannot be ruled out. - No ablation needed at this time.  Indeterminate small bowel PET activity PET scan shows activity in the ileum, possibly normal bowel activity. - Refer to gastroenterologist for further evaluation.  Uterine fibroid PET scan shows activity within uterine fibroid, consistent with normal blood flow. - Advise follow-up with gynecologist for pelvic ultrasound to monitor uterine fibroid.  Ovarian PET activity without evidence of mass or nodule PET  scan shows activity in left ovary without nodules or growths, not concerning. - Advise follow-up with  gynecologist for pelvic ultrasound to monitor ovarian activity.    I discussed the assessment and treatment plan with the patient. The patient was provided an opportunity to ask questions and all were answered. The patient agreed with the plan and demonstrated an understanding of the instructions. The patient was advised to call back or seek an in-person evaluation if the symptoms worsen or if the condition fails to improve as anticipated.   I provided 20 minutes of non-face-to-face time during this encounter.  This includes time for charting and coordination of care   Naomi MARLA Chad, MD

## 2024-08-23 NOTE — Telephone Encounter (Signed)
 S/w patient regarding questions about recent PET scan results.  Patient scheduled for telephone visit this morning with Dr. Odean to review results. Patient confirmed new appointment date and time.

## 2024-08-24 DIAGNOSIS — D252 Subserosal leiomyoma of uterus: Secondary | ICD-10-CM | POA: Diagnosis not present

## 2024-08-24 DIAGNOSIS — N912 Amenorrhea, unspecified: Secondary | ICD-10-CM | POA: Diagnosis not present

## 2024-09-02 ENCOUNTER — Inpatient Hospital Stay: Attending: Hematology and Oncology

## 2024-09-02 ENCOUNTER — Inpatient Hospital Stay

## 2024-09-02 VITALS — BP 135/76 | HR 67 | Temp 98.1°F | Resp 16 | Wt 176.5 lb

## 2024-09-02 DIAGNOSIS — Z9221 Personal history of antineoplastic chemotherapy: Secondary | ICD-10-CM | POA: Insufficient documentation

## 2024-09-02 DIAGNOSIS — Z923 Personal history of irradiation: Secondary | ICD-10-CM | POA: Diagnosis not present

## 2024-09-02 DIAGNOSIS — D329 Benign neoplasm of meninges, unspecified: Secondary | ICD-10-CM | POA: Diagnosis not present

## 2024-09-02 DIAGNOSIS — Z5112 Encounter for antineoplastic immunotherapy: Secondary | ICD-10-CM | POA: Diagnosis not present

## 2024-09-02 DIAGNOSIS — C787 Secondary malignant neoplasm of liver and intrahepatic bile duct: Secondary | ICD-10-CM | POA: Insufficient documentation

## 2024-09-02 DIAGNOSIS — Z9011 Acquired absence of right breast and nipple: Secondary | ICD-10-CM | POA: Diagnosis not present

## 2024-09-02 DIAGNOSIS — D0511 Intraductal carcinoma in situ of right breast: Secondary | ICD-10-CM | POA: Diagnosis not present

## 2024-09-02 DIAGNOSIS — Z170 Estrogen receptor positive status [ER+]: Secondary | ICD-10-CM | POA: Insufficient documentation

## 2024-09-02 DIAGNOSIS — Z79899 Other long term (current) drug therapy: Secondary | ICD-10-CM | POA: Insufficient documentation

## 2024-09-02 DIAGNOSIS — C50919 Malignant neoplasm of unspecified site of unspecified female breast: Secondary | ICD-10-CM

## 2024-09-02 LAB — CBC WITH DIFFERENTIAL (CANCER CENTER ONLY)
Abs Immature Granulocytes: 0.01 K/uL (ref 0.00–0.07)
Basophils Absolute: 0.1 K/uL (ref 0.0–0.1)
Basophils Relative: 1 %
Eosinophils Absolute: 0.2 K/uL (ref 0.0–0.5)
Eosinophils Relative: 3 %
HCT: 38 % (ref 36.0–46.0)
Hemoglobin: 12.8 g/dL (ref 12.0–15.0)
Immature Granulocytes: 0 %
Lymphocytes Relative: 33 %
Lymphs Abs: 1.9 K/uL (ref 0.7–4.0)
MCH: 29.3 pg (ref 26.0–34.0)
MCHC: 33.7 g/dL (ref 30.0–36.0)
MCV: 87 fL (ref 80.0–100.0)
Monocytes Absolute: 0.5 K/uL (ref 0.1–1.0)
Monocytes Relative: 8 %
Neutro Abs: 3.1 K/uL (ref 1.7–7.7)
Neutrophils Relative %: 55 %
Platelet Count: 217 K/uL (ref 150–400)
RBC: 4.37 MIL/uL (ref 3.87–5.11)
RDW: 13.8 % (ref 11.5–15.5)
WBC Count: 5.7 K/uL (ref 4.0–10.5)
nRBC: 0 % (ref 0.0–0.2)

## 2024-09-02 LAB — CMP (CANCER CENTER ONLY)
ALT: 15 U/L (ref 0–44)
AST: 14 U/L — ABNORMAL LOW (ref 15–41)
Albumin: 4.1 g/dL (ref 3.5–5.0)
Alkaline Phosphatase: 72 U/L (ref 38–126)
Anion gap: 6 (ref 5–15)
BUN: 21 mg/dL — ABNORMAL HIGH (ref 6–20)
CO2: 25 mmol/L (ref 22–32)
Calcium: 9.3 mg/dL (ref 8.9–10.3)
Chloride: 107 mmol/L (ref 98–111)
Creatinine: 0.72 mg/dL (ref 0.44–1.00)
GFR, Estimated: >60 mL/min (ref >60)
Glucose, Bld: 100 mg/dL — ABNORMAL HIGH (ref 70–99)
Potassium: 4 mmol/L (ref 3.5–5.1)
Sodium: 138 mmol/L (ref 135–145)
Total Bilirubin: 0.3 mg/dL (ref 0.0–1.2)
Total Protein: 7.1 g/dL (ref 6.5–8.1)

## 2024-09-02 MED ORDER — TRASTUZUMAB-ANNS CHEMO 150 MG IV SOLR
6.0000 mg/kg | Freq: Once | INTRAVENOUS | Status: AC
  Administered 2024-09-02: 483 mg via INTRAVENOUS
  Filled 2024-09-02: qty 23

## 2024-09-02 MED ORDER — SODIUM CHLORIDE 0.9 % IV SOLN: INTRAVENOUS | Status: DC

## 2024-09-02 MED ORDER — DIPHENHYDRAMINE HCL 25 MG PO CAPS
50.0000 mg | ORAL_CAPSULE | Freq: Once | ORAL | Status: AC
  Administered 2024-09-02: 50 mg via ORAL
  Filled 2024-09-02: qty 2

## 2024-09-02 MED ORDER — SODIUM CHLORIDE 0.9 % IV SOLN
420.0000 mg | Freq: Once | INTRAVENOUS | Status: AC
  Administered 2024-09-02: 420 mg via INTRAVENOUS
  Filled 2024-09-02: qty 14

## 2024-09-02 MED ORDER — ACETAMINOPHEN 325 MG PO TABS
650.0000 mg | ORAL_TABLET | Freq: Once | ORAL | Status: AC
  Administered 2024-09-02: 650 mg via ORAL
  Filled 2024-09-02: qty 2

## 2024-09-02 NOTE — Patient Instructions (Signed)
 [facility] - A DEPT OF [facility]  Discharge Instructions: Thank you for choosing [facility] to provide your oncology and hematology care.   If you have a lab appointment with the Cancer Center, please go directly to the Cancer Center and check in at the registration area.   Wear comfortable clothing and clothing appropriate for easy access to any Portacath or PICC line.   We strive to give you quality time with your provider. You may need to reschedule your appointment if you arrive late (15 or more minutes).  Arriving late affects you and other patients whose appointments are after yours.  Also, if you miss three or more appointments without notifying the office, you may be dismissed from the clinic at the provider's discretion.      For prescription refill requests, have your pharmacy contact our office and allow 72 hours for refills to be completed.    Today you received the following chemotherapy and/or immunotherapy agents: trastuzumab  and pertuzumab       To help prevent nausea and vomiting after your treatment, we encourage you to take your nausea medication as directed.  BELOW ARE SYMPTOMS THAT SHOULD BE REPORTED IMMEDIATELY: *FEVER GREATER THAN 100.4 F (38 C) OR HIGHER *CHILLS OR SWEATING *NAUSEA AND VOMITING THAT IS NOT CONTROLLED WITH YOUR NAUSEA MEDICATION *UNUSUAL SHORTNESS OF BREATH *UNUSUAL BRUISING OR BLEEDING *URINARY PROBLEMS (pain or burning when urinating, or frequent urination) *BOWEL PROBLEMS (unusual diarrhea, constipation, pain near the anus) TENDERNESS IN MOUTH AND THROAT WITH OR WITHOUT PRESENCE OF ULCERS (sore throat, sores in mouth, or a toothache) UNUSUAL RASH, SWELLING OR PAIN  UNUSUAL VAGINAL DISCHARGE OR ITCHING   Items with * indicate a potential emergency and should be followed up as soon as possible or go to the Emergency Department if any problems should occur.  Please show the CHEMOTHERAPY ALERT CARD  or IMMUNOTHERAPY ALERT CARD at check-in to the Emergency Department and triage nurse.  Should you have questions after your visit or need to cancel or reschedule your appointment, please contact [facility] - A DEPT OF [facility]  Dept: [phone]  and follow the prompts.  Office hours are 8:00 a.m. to 4:30 p.m. Monday - Friday. Please note that voicemails left after 4:00 p.m. may not be returned until the following business day.  We are closed weekends and major holidays. You have access to a nurse at all times for urgent questions. Please call the main number to the clinic Dept: [phone] and follow the prompts.   For any non-urgent questions, you may also contact your provider using MyChart. We now offer e-Visits for anyone 30 and older to request care online for non-urgent symptoms. For details visit [email or web address].   Also download the MyChart app! Go to the app store, search "MyChart", open the app, select [facility], and log in with your MyChart username and password.

## 2024-09-03 LAB — CANCER ANTIGEN 27.29: CA 27.29: 18.7 U/mL (ref 0.0–38.6)

## 2024-09-07 ENCOUNTER — Other Ambulatory Visit (HOSPITAL_COMMUNITY): Payer: Self-pay | Admitting: Cardiology

## 2024-09-07 DIAGNOSIS — I427 Cardiomyopathy due to drug and external agent: Secondary | ICD-10-CM

## 2024-09-07 NOTE — Progress Notes (Signed)
 Order placed for upcoming echo appt

## 2024-09-08 ENCOUNTER — Encounter (HOSPITAL_COMMUNITY): Admitting: Internal Medicine

## 2024-09-08 ENCOUNTER — Ambulatory Visit (HOSPITAL_COMMUNITY)
Admission: RE | Admit: 2024-09-08 | Discharge: 2024-09-08 | Disposition: A | Discharge status: Home / Self Care | Source: Ambulatory Visit | Attending: Internal Medicine | Admitting: Internal Medicine

## 2024-09-08 DIAGNOSIS — I427 Cardiomyopathy due to drug and external agent: Secondary | ICD-10-CM | POA: Diagnosis not present

## 2024-09-08 DIAGNOSIS — T451X5A Adverse effect of antineoplastic and immunosuppressive drugs, initial encounter: Secondary | ICD-10-CM | POA: Insufficient documentation

## 2024-09-08 LAB — ECHOCARDIOGRAM COMPLETE
Area-P 1/2: 3.89 cm2
S' Lateral: 3.1 cm

## 2024-09-09 DIAGNOSIS — R9389 Abnormal findings on diagnostic imaging of other specified body structures: Secondary | ICD-10-CM | POA: Diagnosis not present

## 2024-09-09 DIAGNOSIS — N760 Acute vaginitis: Secondary | ICD-10-CM | POA: Diagnosis not present

## 2024-09-23 ENCOUNTER — Other Ambulatory Visit: Payer: Self-pay

## 2024-09-23 ENCOUNTER — Inpatient Hospital Stay: Admitting: Hematology and Oncology

## 2024-09-23 ENCOUNTER — Inpatient Hospital Stay

## 2024-09-23 VITALS — BP 144/86 | HR 73 | Resp 16

## 2024-09-23 VITALS — BP 132/87 | HR 74 | Temp 98.3°F | Resp 18 | Ht 65.0 in | Wt 174.0 lb

## 2024-09-23 DIAGNOSIS — C50919 Malignant neoplasm of unspecified site of unspecified female breast: Secondary | ICD-10-CM

## 2024-09-23 DIAGNOSIS — Z9221 Personal history of antineoplastic chemotherapy: Secondary | ICD-10-CM | POA: Diagnosis not present

## 2024-09-23 DIAGNOSIS — Z923 Personal history of irradiation: Secondary | ICD-10-CM | POA: Diagnosis not present

## 2024-09-23 DIAGNOSIS — C787 Secondary malignant neoplasm of liver and intrahepatic bile duct: Secondary | ICD-10-CM

## 2024-09-23 DIAGNOSIS — D0511 Intraductal carcinoma in situ of right breast: Secondary | ICD-10-CM | POA: Diagnosis not present

## 2024-09-23 DIAGNOSIS — Z170 Estrogen receptor positive status [ER+]: Secondary | ICD-10-CM | POA: Diagnosis not present

## 2024-09-23 DIAGNOSIS — Z79899 Other long term (current) drug therapy: Secondary | ICD-10-CM | POA: Diagnosis not present

## 2024-09-23 DIAGNOSIS — D329 Benign neoplasm of meninges, unspecified: Secondary | ICD-10-CM | POA: Diagnosis not present

## 2024-09-23 DIAGNOSIS — Z5112 Encounter for antineoplastic immunotherapy: Secondary | ICD-10-CM | POA: Diagnosis not present

## 2024-09-23 LAB — CBC WITH DIFFERENTIAL (CANCER CENTER ONLY)
Abs Immature Granulocytes: 0.01 K/uL (ref 0.00–0.07)
Basophils Absolute: 0.1 K/uL (ref 0.0–0.1)
Basophils Relative: 1 %
Eosinophils Absolute: 0.2 K/uL (ref 0.0–0.5)
Eosinophils Relative: 3 %
HCT: 38.9 % (ref 36.0–46.0)
Hemoglobin: 13 g/dL (ref 12.0–15.0)
Immature Granulocytes: 0 %
Lymphocytes Relative: 36 %
Lymphs Abs: 2 K/uL (ref 0.7–4.0)
MCH: 29.3 pg (ref 26.0–34.0)
MCHC: 33.4 g/dL (ref 30.0–36.0)
MCV: 87.8 fL (ref 80.0–100.0)
Monocytes Absolute: 0.4 K/uL (ref 0.1–1.0)
Monocytes Relative: 6 %
Neutro Abs: 3.1 K/uL (ref 1.7–7.7)
Neutrophils Relative %: 54 %
Platelet Count: 207 K/uL (ref 150–400)
RBC: 4.43 MIL/uL (ref 3.87–5.11)
RDW: 13.6 % (ref 11.5–15.5)
WBC Count: 5.7 K/uL (ref 4.0–10.5)
nRBC: 0 % (ref 0.0–0.2)

## 2024-09-23 LAB — CMP (CANCER CENTER ONLY)
ALT: 19 U/L (ref 0–44)
AST: 17 U/L (ref 15–41)
Albumin: 4.2 g/dL (ref 3.5–5.0)
Alkaline Phosphatase: 67 U/L (ref 38–126)
Anion gap: 6 (ref 5–15)
BUN: 13 mg/dL (ref 6–20)
CO2: 26 mmol/L (ref 22–32)
Calcium: 8.9 mg/dL (ref 8.9–10.3)
Chloride: 107 mmol/L (ref 98–111)
Creatinine: 0.71 mg/dL (ref 0.44–1.00)
GFR, Estimated: >60 mL/min (ref >60)
Glucose, Bld: 125 mg/dL — ABNORMAL HIGH (ref 70–99)
Potassium: 3.7 mmol/L (ref 3.5–5.1)
Sodium: 139 mmol/L (ref 135–145)
Total Bilirubin: 0.3 mg/dL (ref 0.0–1.2)
Total Protein: 6.9 g/dL (ref 6.5–8.1)

## 2024-09-23 LAB — MAGNESIUM: Magnesium: 1.9 mg/dL (ref 1.7–2.4)

## 2024-09-23 MED ORDER — SODIUM CHLORIDE 0.9% FLUSH: 10.0000 mL | INTRAVENOUS | Status: DC | PRN

## 2024-09-23 MED ORDER — TRASTUZUMAB-ANNS CHEMO 150 MG IV SOLR
6.0000 mg/kg | Freq: Once | INTRAVENOUS | Status: AC
  Administered 2024-09-23: 483 mg via INTRAVENOUS
  Filled 2024-09-23: qty 23

## 2024-09-23 MED ORDER — DIPHENHYDRAMINE HCL 25 MG PO CAPS
50.0000 mg | ORAL_CAPSULE | Freq: Once | ORAL | Status: AC
  Administered 2024-09-23: 50 mg via ORAL
  Filled 2024-09-23: qty 2

## 2024-09-23 MED ORDER — ACETAMINOPHEN 325 MG PO TABS
650.0000 mg | ORAL_TABLET | Freq: Once | ORAL | Status: AC
  Administered 2024-09-23: 650 mg via ORAL
  Filled 2024-09-23: qty 2

## 2024-09-23 MED ORDER — SODIUM CHLORIDE 0.9 % IV SOLN
420.0000 mg | Freq: Once | INTRAVENOUS | Status: AC
  Administered 2024-09-23: 420 mg via INTRAVENOUS
  Filled 2024-09-23: qty 14

## 2024-09-23 MED ORDER — SODIUM CHLORIDE 0.9 % IV SOLN: INTRAVENOUS | Status: DC

## 2024-09-23 NOTE — Patient Instructions (Signed)
 [facility] - A DEPT OF [facility]  Discharge Instructions: Thank you for choosing [facility] to provide your oncology and hematology care.   If you have a lab appointment with the Cancer Center, please go directly to the Cancer Center and check in at the registration area.   Wear comfortable clothing and clothing appropriate for easy access to any Portacath or PICC line.   We strive to give you quality time with your provider. You may need to reschedule your appointment if you arrive late (15 or more minutes).  Arriving late affects you and other patients whose appointments are after yours.  Also, if you miss three or more appointments without notifying the office, you may be dismissed from the clinic at the provider's discretion.      For prescription refill requests, have your pharmacy contact our office and allow 72 hours for refills to be completed.    Today you received the following chemotherapy and/or immunotherapy agents: trastuzumab  and pertuzumab       To help prevent nausea and vomiting after your treatment, we encourage you to take your nausea medication as directed.  BELOW ARE SYMPTOMS THAT SHOULD BE REPORTED IMMEDIATELY: *FEVER GREATER THAN 100.4 F (38 C) OR HIGHER *CHILLS OR SWEATING *NAUSEA AND VOMITING THAT IS NOT CONTROLLED WITH YOUR NAUSEA MEDICATION *UNUSUAL SHORTNESS OF BREATH *UNUSUAL BRUISING OR BLEEDING *URINARY PROBLEMS (pain or burning when urinating, or frequent urination) *BOWEL PROBLEMS (unusual diarrhea, constipation, pain near the anus) TENDERNESS IN MOUTH AND THROAT WITH OR WITHOUT PRESENCE OF ULCERS (sore throat, sores in mouth, or a toothache) UNUSUAL RASH, SWELLING OR PAIN  UNUSUAL VAGINAL DISCHARGE OR ITCHING   Items with * indicate a potential emergency and should be followed up as soon as possible or go to the Emergency Department if any problems should occur.  Please show the CHEMOTHERAPY ALERT CARD  or IMMUNOTHERAPY ALERT CARD at check-in to the Emergency Department and triage nurse.  Should you have questions after your visit or need to cancel or reschedule your appointment, please contact [facility] - A DEPT OF [facility]  Dept: [phone]  and follow the prompts.  Office hours are 8:00 a.m. to 4:30 p.m. Monday - Friday. Please note that voicemails left after 4:00 p.m. may not be returned until the following business day.  We are closed weekends and major holidays. You have access to a nurse at all times for urgent questions. Please call the main number to the clinic Dept: [phone] and follow the prompts.   For any non-urgent questions, you may also contact your provider using MyChart. We now offer e-Visits for anyone 30 and older to request care online for non-urgent symptoms. For details visit [email or web address].   Also download the MyChart app! Go to the app store, search "MyChart", open the app, select [facility], and log in with your MyChart username and password.

## 2024-09-23 NOTE — Progress Notes (Signed)
 Patient Care Team: Regino Slater, MD as PCP - General (Family Medicine) [name], RN as Oncology Nurse Navigator [name], MD as Consulting Physician (Hematology and Oncology) Default, Provider, MD as Technician  DIAGNOSIS:  Encounter Diagnoses  Name Primary?   Breast cancer metastasized to liver [facility]) Yes   Carcinoma of breast metastatic to liver, unspecified laterality (HCC)     SUMMARY OF ONCOLOGIC HISTORY: Oncology History  Breast cancer metastasized to liver (HCC)  01/25/2019 Initial Diagnosis   Screening mammogram showed bilateral calcifications right breast 5 mm, pleomorphic.  Left breast 1.6 cm punctate, smaller group of calcifications spanning 3 mm.  Right lower outer quadrant biopsy revealed low-grade DCIS ER 90%, PR 70%; biopsy of left breast calcifications ALH and PASH   01/31/2019 Genetic Testing   ATM c.8495G>A and RECQL4 c.2587G>A VUS identified on the 9-gene STAT panel and Multi-cancer panel through [facility].  The STAT Breast cancer panel offered by [facility] includes sequencing and rearrangement analysis for the following 9 genes:  ATM, BRCA1, BRCA2, CDH1, CHEK2, PALB2, PTEN, STK11 and TP53.   The Multi-Gene Panel offered by [facility] includes sequencing and/or deletion duplication testing of the following 84 genes: AIP, ALK, APC, ATM, AXIN2,BAP1,  BARD1, BLM, BMPR1A, BRCA1, BRCA2, BRIP1, CASR, CDC73, CDH1, CDK4, CDKN1B, CDKN1C, CDKN2A (p14ARF), CDKN2A (p16INK4a), CEBPA, CHEK2, CTNNA1, DICER1, DIS3L2, EGFR (c.2369C>T, p.Thr790Met variant only), EPCAM (Deletion/duplication testing only), FH, FLCN, GATA2, GPC3, GREM1 (Promoter region deletion/duplication testing only), HOXB13 (c.251G>A, p.Gly84Glu), HRAS, KIT, MAX, MEN1, MET, MITF (c.952G>A, p.Glu318Lys variant only), MLH1, MSH2, MSH3, MSH6, MUTYH, NBN, NF1, NF2, NTHL1, PALB2, PDGFRA, PHOX2B, PMS2, POLD1, POLE, POT1, PRKAR1A, PTCH1, PTEN, RAD50, RAD51C, RAD51D, RB1, RECQL4, RET, RUNX1, SDHAF2, SDHA (sequence changes  only), SDHB, SDHC, SDHD, SMAD4, SMARCA4, SMARCB1, SMARCE1, STK11, SUFU, TERC, TERT, TMEM127, TP53, TSC1, TSC2, VHL, WRN and WT1.  he report date is February 02, 2019.   02/11/2019 Surgery   RT lumpectomy: DICS Intermediate grade, Margins Neg, ER 90%, PR 70%; Lt Lumpectomy: UDH   02/26/2019 -  Anti-estrogen oral therapy   Tamoxifen  20mg  daily, stopped because of depression and facial numbness   05/20/2019 - 06/17/2019 Radiation Therapy   Adjuvant XRT   08/15/2021 Relapse/Recurrence   MRI surveillance detected Right breast calcifications 5 cm, Ant biopsy: IG DCIS with necrosis, ER 40%, PR 0%, Posterior biopsy: Intermediate grade DCIS with [facility]   08/19/2022 Cancer Staging   Staging form: Breast, AJCC 8th Edition - Clinical: Stage IV (cT8mi, cNX, cM1, G3, ER: Unknown, PR: Unknown, HER2: Unknown) - Signed by [name], MD on 02/06/2023 Histologic grading system: 3 grade system   02/14/2023 - 01/15/2024 Chemotherapy   Patient is on Treatment Plan : BREAST DOCEtaxel  + Trastuzumab  + Pertuzumab  (THP) q21d x 8 cycles / Trastuzumab  + Pertuzumab  q21d x 4 cycles     02/05/2024 -  Chemotherapy   Patient is on Treatment Plan : BREAST Trastuzumab   + Pertuzumab  q21d x 13 cycles     AML (acute myeloid leukemia) (HCC)  09/1998 Initial Diagnosis   AML (acute myeloid leukemia) (HCC) treated with 2 induction regimens followed by 6 consolidation regimens, remission     CHIEF COMPLIANT: Herceptin  Perjeta  maintenance for metastatic breast cancer  HISTORY OF PRESENT ILLNESS:  History of Present Illness Joann Meadows is a 48 year old female with a history of cancer who presents with leg cramps and muscle pain.  She is here to receive Herceptin  Perjeta  maintenance  She experiences leg cramps and muscle pain for the past two to three  days, primarily at night, disrupting her sleep. Muscle pain is notable during activities such as climbing stairs. There is no recent change in physical activity levels. Potassium levels  were previously normal, with current results pending, and magnesium levels have not been checked. She experiences diarrhea. She has a history of cancer with previous treatments affecting her menstrual cycle and undergoes regular scans every three months.     ALLERGIES:  is allergic to levaquin  [levofloxacin ], tamoxifen  citrate, and tape.  MEDICATIONS:  Current Outpatient Medications  Medication Sig Dispense Refill   acetaminophen  (TYLENOL ) 325 MG tablet Take 650 mg by mouth every 6 (six) hours as needed.     ALPRAZolam (XANAX) 0.25 MG tablet      cholecalciferol (VITAMIN D3) 25 MCG (1000 UNIT) tablet Take 2,000 Units by mouth daily.     fluticasone (FLONASE) 50 MCG/ACT nasal spray Place 1 spray into both nostrils daily.     Ibuprofen  (ADVIL ) 200 MG CAPS Take 1 capsule by mouth as needed.     phenylephrine  (SUDAFED PE) 10 MG TABS tablet Take 10 mg by mouth as needed.     rosuvastatin (CRESTOR) 5 MG tablet Take 5 mg by mouth daily.     sacubitril -valsartan  (ENTRESTO ) 97-103 MG Take 1 tablet by mouth 2 (two) times daily. 60 tablet 11   Zinc-Vitamin C (ZINC-A-COLD/VITAMIN C MT) Use as directed 1 capsule in the mouth or throat daily.     Cyanocobalamin (B-12) 500 MCG TABS 500 mg.     fluconazole  (DIFLUCAN ) 100 MG tablet Take 1 tablet (100 mg total) by mouth daily. (Patient not taking: Reported on 09/23/2024) 15 tablet 1   loratadine (CLARITIN) 10 MG tablet 1 tablet Orally Once a day (Patient not taking: Reported on 09/23/2024)     No current facility-administered medications for this visit.    PHYSICAL EXAMINATION: ECOG PERFORMANCE STATUS: 1 - Symptomatic but completely ambulatory  Vitals:   09/23/24 0950  BP: 132/87  Pulse: 74  Resp: 18  Temp: 98.3 F (36.8 C)  SpO2: 100%   Filed Weights   09/23/24 0950  Weight: 174 lb (78.9 kg)      LABORATORY DATA:  I have reviewed the data as listed    Latest Ref Rng & Units 09/02/2024    7:58 AM 08/12/2024    7:59 AM 07/22/2024    8:11  AM  CMP  Glucose 70 - 99 mg/dL 899  898  898   BUN 6 - 20 mg/dL 21  18  18    Creatinine 0.44 - 1.00 mg/dL 9.27  9.32  9.34   Sodium 135 - 145 mmol/L 138  138  139   Potassium 3.5 - 5.1 mmol/L 4.0  4.1  3.9   Chloride 98 - 111 mmol/L 107  105  107   CO2 22 - 32 mmol/L 25  28  27    Calcium 8.9 - 10.3 mg/dL 9.3  8.9  8.6   Total Protein 6.5 - 8.1 g/dL 7.1  6.7  6.4   Total Bilirubin 0.0 - 1.2 mg/dL 0.3  0.4  0.3   Alkaline Phos 38 - 126 U/L 72  64  62   AST 15 - 41 U/L 14  13  12    ALT 0 - 44 U/L 15  15  14      Lab Results  Component Value Date   WBC 5.7 09/23/2024   HGB 13.0 09/23/2024   HCT 38.9 09/23/2024   MCV 87.8 09/23/2024   PLT 207 09/23/2024  NEUTROABS 3.1 09/23/2024    ASSESSMENT & PLAN:  Breast cancer metastasized to liver (HCC) 02/11/19: RT lumpectomy: DICS Intermediate grade, Margins Neg, ER 90%, PR 70%; Lt Lumpectomy: UDH status post radiation, could not tolerate tamoxifen  recurrence: 08/15/2021:MRI surveillance detected Right breast calcifications 5 cm, Ant biopsy: IG DCIS with necrosis, ER 40%, PR 0%, Posterior biopsy: Intermediate grade DCIS with [facility]  11/29/2021: Right mastectomy: High-grade DCIS with microinvasion, ER 0%, PR 0%, HER2 positive, 0/1 lymph node negative  T1c MIC, N0: Stage Ia   Patient had second opinion at [facility] with Dr. Eleanora who agreed with her plan of no systemic chemotherapy   Breast cancer surveillance: 1.  Mammogram 08/22/2022: Left breast benign breast density category B  2.  CT CAP 12/09/2022: Multiple liver lesions indicative of cysts and hemangiomas new right hepatic lobe lesion favored to be hemangioma otherwise no evidence of metastatic disease.  Radiology recommended an MRI of the liver. 3.  Liver MRI 01/08/2023: 2 New rim-enhancing lesions 2.3 cm (was 1.6 cm on 12/09/2022), 1.8 cm (measured 1.5 cm), benign hemangioma 2.4 cm and 1.9 cm and 1.3 cm unchanged 4.  PET CT scan 01/22/2023: Liver: 2.5 cm and 2.4 cm  lesions ------------------------------------------------------------------------------------------------------------------------------------------------- 01/31/2023: Liver lesion biopsy: Metastatic adenocarcinoma to the liver consistent with breast primary positive for CK7 and GATA3, ER 0%, PR 0%, Ki-67 25%, HER2 3+ positive, [facility] molecular testing 02/21/2023: ER negative PR negative, HER2 amplified, ER positive TMB 4, BRCA negative, ESR 1 unmutated, PD-L1 negative, CPS: 1, PTEN IHC +2+ but mutation not detected   Current treatment: Completed 6 cycles of Taxotere  Herceptin  Perjeta  on 05/30/2023, currently on maintenance Herceptin  and Perjeta  (now being given intravenously because of skin irritation from injections)   Herceptin  Perjeta  toxicities:  Cardiomyopathy: Fatigue and palpitations on Entresto  and metoprolol .  Follows cardiology Meningioma: Follows with Dr. Buckley, brain MRI  08/04/2023: Stable, new brain MRI has been ordered    MRI 05/05/2024: stable small hypervascular liver lesions CT CAP 05/17/24: Small lung nodules unchanged, benign liver cysts, no lymphadenopathy or met disease PET/CT 08/20/2024: Previous liver lesion not conspicuous.  New focal activity right lower quadrant distal ileal small bowel loop indeterminate.  Stable lung nodules without PET uptake   Radiology counseling: Responding very well to current treatment.   Patient will call her gastroenterologist for the ileal lesion.:  She has an appointment next week. GYN did an evaluation of uterus and ovaries and felt it was not of any consequence.   Leg cramps: We will obtain magnesium level checked today.  If it is low then she will take 400 mg of magnesium supplement.  Return to clinic every 3 weeks for Herceptin  and Perjeta  and I will see her every 6 weeks.      Orders Placed This Encounter  Procedures   CT CHEST ABDOMEN PELVIS W CONTRAST    Standing Status:   Future    Expected Date:   12/02/2024    Expiration Date:    09/23/2025    If indicated for the ordered procedure, I authorize the administration of contrast media per Radiology protocol:   Yes    Does the patient have a contrast media/X-ray dye allergy?:   No    Preferred imaging location?:   [facility]    Release to patient:   Immediate    If indicated for the ordered procedure, I authorize the administration of oral contrast media per Radiology protocol:   No    Reason for no oral contrast::  breast   Magnesium    Standing Status:   Future    Expiration Date:   09/23/2025   The patient has a good understanding of the overall plan. she agrees with it. she will call with any problems that may develop before the next visit here.  I personally spent a total of 30 minutes in the care of the patient today including preparing to see the patient, getting/reviewing separately obtained history, performing a medically appropriate exam/evaluation, counseling and educating, placing orders, referring and communicating with other health care professionals, documenting clinical information in the EHR, independently interpreting results, communicating results, and coordinating care.   Joann K Daija Routson, MD 09/23/24

## 2024-09-23 NOTE — Assessment & Plan Note (Signed)
 02/11/19: RT lumpectomy: DICS Intermediate grade, Margins Neg, ER 90%, PR 70%; Lt Lumpectomy: UDH status post radiation, could not tolerate tamoxifen  recurrence: 08/15/2021:MRI surveillance detected Right breast calcifications 5 cm, Ant biopsy: IG DCIS with necrosis, ER 40%, PR 0%, Posterior biopsy: Intermediate grade DCIS with [facility]  11/29/2021: Right mastectomy: High-grade DCIS with microinvasion, ER 0%, PR 0%, HER2 positive, 0/1 lymph node negative  T1c MIC, N0: Stage Ia   Patient had second opinion at [facility] with Dr. Eleanora who agreed with her plan of no systemic chemotherapy   Breast cancer surveillance: 1.  Mammogram 08/22/2022: Left breast benign breast density category B  2.  CT CAP 12/09/2022: Multiple liver lesions indicative of cysts and hemangiomas new right hepatic lobe lesion favored to be hemangioma otherwise no evidence of metastatic disease.  Radiology recommended an MRI of the liver. 3.  Liver MRI 01/08/2023: 2 New rim-enhancing lesions 2.3 cm (was 1.6 cm on 12/09/2022), 1.8 cm (measured 1.5 cm), benign hemangioma 2.4 cm and 1.9 cm and 1.3 cm unchanged 4.  PET CT scan 01/22/2023: Liver: 2.5 cm and 2.4 cm lesions ------------------------------------------------------------------------------------------------------------------------------------------------- 01/31/2023: Liver lesion biopsy: Metastatic adenocarcinoma to the liver consistent with breast primary positive for CK7 and GATA3, ER 0%, PR 0%, Ki-67 25%, HER2 3+ positive, [facility] molecular testing 02/21/2023: ER negative PR negative, HER2 amplified, ER positive TMB 4, BRCA negative, ESR 1 unmutated, PD-L1 negative, CPS: 1, PTEN IHC +2+ but mutation not detected   Current treatment: Completed 6 cycles of Taxotere  Herceptin  Perjeta  on 05/30/2023, currently on maintenance Herceptin  and Perjeta  (now being given intravenously because of skin irritation from injections)   Herceptin  Perjeta  toxicities:  Cardiomyopathy: Fatigue and palpitations on  Entresto  and metoprolol .  Follows cardiology Meningioma: Follows with Dr. Buckley, brain MRI  08/04/2023: Stable, new brain MRI has been ordered    MRI 05/05/2024: stable small hypervascular liver lesions CT CAP 05/17/24: Small lung nodules unchanged, benign liver cysts, no lymphadenopathy or met disease PET/CT 08/20/2024: Previous liver lesion not conspicuous.  New focal activity right lower quadrant distal ileal small bowel loop indeterminate.  Stable lung nodules without PET uptake   Radiology counseling: Responding very well to current treatment.   Patient will call her gastroenterologist for the ileal lesion. She will call her gynecologist to talk about the ovarian activity and the fibroids.   Return to clinic every 3 weeks for Herceptin  and Perjeta 

## 2024-09-24 ENCOUNTER — Other Ambulatory Visit: Payer: Self-pay | Admitting: Gastroenterology

## 2024-09-24 DIAGNOSIS — R948 Abnormal results of function studies of other organs and systems: Secondary | ICD-10-CM | POA: Diagnosis not present

## 2024-09-24 DIAGNOSIS — Z800 Family history of malignant neoplasm of digestive organs: Secondary | ICD-10-CM | POA: Diagnosis not present

## 2024-09-24 DIAGNOSIS — C228 Malignant neoplasm of liver, primary, unspecified as to type: Secondary | ICD-10-CM | POA: Diagnosis not present

## 2024-09-24 DIAGNOSIS — C50919 Malignant neoplasm of unspecified site of unspecified female breast: Secondary | ICD-10-CM | POA: Diagnosis not present

## 2024-09-24 LAB — CANCER ANTIGEN 27.29: CA 27.29: 21.5 U/mL (ref 0.0–38.6)

## 2024-09-30 ENCOUNTER — Ambulatory Visit
Admission: RE | Admit: 2024-09-30 | Discharge: 2024-09-30 | Disposition: A | Discharge status: Home / Self Care | Source: Ambulatory Visit | Attending: Gastroenterology | Admitting: Gastroenterology

## 2024-09-30 DIAGNOSIS — R948 Abnormal results of function studies of other organs and systems: Secondary | ICD-10-CM

## 2024-09-30 DIAGNOSIS — R9389 Abnormal findings on diagnostic imaging of other specified body structures: Secondary | ICD-10-CM | POA: Diagnosis not present

## 2024-09-30 MED ORDER — IOPAMIDOL (ISOVUE-370) INJECTION 76%
80.0000 mL | Freq: Once | INTRAVENOUS | Status: AC | PRN
  Administered 2024-09-30: 80 mL via INTRAVENOUS

## 2024-10-05 ENCOUNTER — Encounter (HOSPITAL_COMMUNITY): Admitting: Internal Medicine

## 2024-10-06 ENCOUNTER — Ambulatory Visit (HOSPITAL_COMMUNITY)
Admission: RE | Admit: 2024-10-06 | Discharge: 2024-10-06 | Disposition: A | Discharge status: Home / Self Care | Source: Ambulatory Visit | Attending: Internal Medicine | Admitting: Internal Medicine

## 2024-10-06 ENCOUNTER — Telehealth (HOSPITAL_COMMUNITY): Payer: Self-pay

## 2024-10-06 ENCOUNTER — Encounter (HOSPITAL_COMMUNITY): Payer: Self-pay | Admitting: Internal Medicine

## 2024-10-06 VITALS — BP 130/80 | HR 77 | Wt 175.4 lb

## 2024-10-06 DIAGNOSIS — R5383 Other fatigue: Secondary | ICD-10-CM | POA: Diagnosis not present

## 2024-10-06 DIAGNOSIS — T451X5A Adverse effect of antineoplastic and immunosuppressive drugs, initial encounter: Secondary | ICD-10-CM | POA: Diagnosis not present

## 2024-10-06 DIAGNOSIS — R0683 Snoring: Secondary | ICD-10-CM | POA: Diagnosis not present

## 2024-10-06 DIAGNOSIS — C50919 Malignant neoplasm of unspecified site of unspecified female breast: Secondary | ICD-10-CM | POA: Diagnosis not present

## 2024-10-06 DIAGNOSIS — I427 Cardiomyopathy due to drug and external agent: Secondary | ICD-10-CM | POA: Diagnosis not present

## 2024-10-06 DIAGNOSIS — I10 Essential (primary) hypertension: Secondary | ICD-10-CM | POA: Diagnosis not present

## 2024-10-06 DIAGNOSIS — R0602 Shortness of breath: Secondary | ICD-10-CM | POA: Insufficient documentation

## 2024-10-06 DIAGNOSIS — C787 Secondary malignant neoplasm of liver and intrahepatic bile duct: Secondary | ICD-10-CM | POA: Insufficient documentation

## 2024-10-06 MED ORDER — SPIRONOLACTONE 25 MG PO TABS
12.5000 mg | ORAL_TABLET | Freq: Every day | ORAL | 3 refills | Status: AC
Start: 2024-10-06 — End: ?

## 2024-10-06 NOTE — Patient Instructions (Addendum)
 Medication Changes:  START Spironolactone 12.5 mg (1/2 tab) Daily  Testing/Procedures:  Your physician has requested that you have an echocardiogram. Echocardiography is a painless test that uses sound waves to create images of your heart. It provides your doctor with information about the size and shape of your heart and how well your heart's chambers and valves are working. This procedure takes approximately one hour. There are no restrictions for this procedure. Please do NOT wear cologne, perfume, aftershave, or lotions (deodorant is allowed). Please arrive 15 minutes prior to your appointment time. IN 3 MONTHS  Please note: We ask at that you not bring children with you during ultrasound (echo/ vascular) testing. Due to room size and safety concerns, children are not allowed in the ultrasound rooms during exams. Our front office staff cannot provide observation of children in our lobby area while testing is being conducted. An adult accompanying a patient to their appointment will only be allowed in the ultrasound room at the discretion of the ultrasound technician under special circumstances. We apologize for any inconvenience.   Your provider has recommended that you have a home sleep study (Itamar Test).  We have provided you with the equipment in our office today. Please go ahead and download the app. DO NOT OPEN OR TAMPER WITH THE BOX UNTIL WE ADVISE YOU TO DO SO. Once insurance has approved the test our office will call you with PIN number and approval to proceed with testing. Once you have completed the test you just dispose of the equipment, the information is automatically uploaded to us  via blue-tooth technology. If your test is positive for sleep apnea and you need a home CPAP machine you will be contacted by Dr Dorine office [facility]) to set this up.   Special Instructions // Education:  Do the following things EVERYDAY: Weigh yourself in the morning before breakfast.  Write it down and keep it in a log. Take your medicines as prescribed Eat low salt foods--Limit salt (sodium) to 2000 mg per day.  Stay as active as you can everyday Limit all fluids for the day to less than 2 liters   Follow-Up in: 3 months with echocardiogram (February 2026), **PLEASE CALL OUR OFFICE IN JANUARY TO SCHEDULE THIS APPOINTMENT   At the [facility], you and your health needs are our priority. We have a designated team specialized in the treatment of Heart Failure. This Care Team includes your primary Heart Failure Specialized Cardiologist (physician), Advanced Practice Providers (APPs- Physician Assistants and Nurse Practitioners), and Pharmacist who all work together to provide you with the care you need, when you need it.   You may see any of the following providers on your designated Care Team at your next follow up:  Dr. Toribio Fuel Dr. Ezra Shuck Dr. Odis Brownie Greig Mosses, NP Caffie Shed, [place] [facility], [place] Beckey Coe, NP Jordan Lee, NP Tinnie Redman, PharmD   Please be sure to bring in all your medications bottles to every appointment.   Need to Contact Us :  If you have any questions or concerns before your next appointment please send us  a message through [place] or call our office at [phone].    TO LEAVE A MESSAGE FOR THE NURSE SELECT OPTION 2, PLEASE LEAVE A MESSAGE INCLUDING: YOUR NAME DATE OF BIRTH CALL BACK NUMBER REASON FOR CALL**this is important as we prioritize the call backs  YOU WILL RECEIVE A CALL BACK THE SAME DAY AS LONG AS YOU CALL  BEFORE 4:00 PM

## 2024-10-06 NOTE — Progress Notes (Signed)
 ITAMAR home sleep study given to patient, all instructions explained, waiver signed, and CLOUDPAT registration complete.

## 2024-10-06 NOTE — Progress Notes (Signed)
 CARDIO-ONCOLOGY CLINIC NOTE  Referring Physician: Primary Care: Primary Cardiologist:  HPI:  Joann Meadows is 48 y.o. female with breast cancer referred by Dr. Odean for enrollment into the Cardio-Oncology program.  Treated for AML 09/1998 treated with 2 induction regimens (including doxrubicin) followed by 6 consolidation regimens, remission   Diagnosed with R breast CA DCIS in 3/20 ER/PR+  Underwent lumpectomy Treated with tamoxifen  and XRT  Relapse in 9/22   9/23 found to have Stage IV CA in 3/24 started DOCEtaxel  + Trastuzumab  + Pertuzumab  (THP) q21d x 8 cycles / Trastuzumab  + Pertuzumab  q21d x 4 cycles   PET/CT: 06/19/2023: Interval resolution of the hypermetabolic lesions in the liver. PET/CT 10/03/2023: No evidence of recurrent/metastatic cancer low-density liver lesions less conspicuous without any hypermetabolic activity  ECHO:  7/24 EF 55-60% GLS -18.0  ECHO 11/24 50-55% GLS -13.4 (poor tracking)  POCUS ECHO 11/21/23 EF 50-55%  Echo  01/30/24 EF 55-60% GLS -19.2%  Echo EF 6/25 50-55% GLS -14.1%  Echo 09/08/24 EF 55-60% GLS -14.3%  Here for f/u. Remains on herceptin  and perjeta . Feels good. Riding Peloton without problem. At office visits BP typically 125-135/70-80s at infusion clinic tends to be higher. Tends to feel more SOB after her treatments for 2-3 days Does not get a lot of lfuid   Past Medical History:  Diagnosis Date   Breast cancer (HCC) 2020   Right Breast Cancer   Endometriosis    Fibroid    Hx of radiation therapy 06/2019   Leukemia (HCC)    20 years ago   Personal history of radiation therapy 2020   Right Breast Cancer   PONV (postoperative nausea and vomiting)    pt states she vomits after colonoscopies    Current Outpatient Medications  Medication Sig Dispense Refill   acetaminophen  (TYLENOL ) 325 MG tablet Take 650 mg by mouth every 6 (six) hours as needed.     ALPRAZolam (XANAX) 0.25 MG tablet      cholecalciferol (VITAMIN D3) 25 MCG (1000  UNIT) tablet Take 2,000 Units by mouth daily.     Cyanocobalamin (B-12) 500 MCG TABS 500 mg.     fluticasone (FLONASE) 50 MCG/ACT nasal spray Place 1 spray into both nostrils daily.     Ibuprofen  (ADVIL ) 200 MG CAPS Take 1 capsule by mouth as needed.     loratadine (CLARITIN) 10 MG tablet 1 tablet Orally Once a day     Magnesium Oxide -Mg Supplement 200 MG TABS Take 200 mg by mouth daily.     phenylephrine  (SUDAFED PE) 10 MG TABS tablet Take 10 mg by mouth as needed.     rosuvastatin (CRESTOR) 5 MG tablet Take 5 mg by mouth daily.     sacubitril -valsartan  (ENTRESTO ) 97-103 MG Take 1 tablet by mouth 2 (two) times daily. 60 tablet 11   Zinc-Vitamin C (ZINC-A-COLD/VITAMIN C MT) Use as directed 1 capsule in the mouth or throat daily.     No current facility-administered medications for this encounter.    Allergies  Allergen Reactions   Levaquin  [Levofloxacin ] Swelling    Swelling feet and hands   Tamoxifen  Citrate     Other reaction(s): depression, joint pain   Tape Rash      Social History   Socioeconomic History   Marital status: Single    Spouse name: Not on file   Number of children: 2   Years of education: Not on file   Highest education level: High school graduate  Occupational History   Not  on file  Tobacco Use   Smoking status: Former    Current packs/day: 0.00    Types: Cigarettes    Start date: 01/24/2005    Quit date: 01/25/2015    Years since quitting: 9.7   Smokeless tobacco: Never  Vaping Use   Vaping status: Never Used  Substance and Sexual Activity   Alcohol use: No   Drug use: No   Sexual activity: Yes    Birth control/protection: None  Other Topics Concern   Not on file  Social History Narrative   Lives with children   Social Drivers of Health   Financial Resource Strain: Not on file  Food Insecurity: No Food Insecurity (10/03/2022)   Hunger Vital Sign    Worried About Running Out of Food in the Last Year: Never true    Ran Out of Food in the Last  Year: Never true  Transportation Needs: No Transportation Needs (10/03/2022)   PRAPARE - Administrator, Civil Service (Medical): No    Lack of Transportation (Non-Medical): No  Physical Activity: Not on file  Stress: Not on file  Social Connections: Not on file  Intimate Partner Violence: Not At Risk (02/26/2019)   Humiliation, Afraid, Rape, and Kick questionnaire    Fear of Current or Ex-Partner: No    Emotionally Abused: No    Physically Abused: No    Sexually Abused: No      Family History  Problem Relation Age of Onset   Colon cancer Mother 40       d. 11   Hypertension Father    Diabetes Maternal Aunt    Diabetes Maternal Uncle    Mental retardation Maternal Uncle    Diabetes Paternal Aunt    Diabetes Maternal Grandmother    Stroke Paternal Grandmother        c. 40   Leukemia Paternal Grandfather        d. 66; ?CLL   Leukemia Cousin 4       mat first cousin   Lymphoma Cousin 40       pat first cousin    Vitals:   10/06/24 0911  BP: 130/80  Pulse: 77  SpO2: 97%  Weight: 79.6 kg (175 lb 6.4 oz)      PHYSICAL EXAM: General:  Well appearing. No resp difficulty HEENT: normal Neck: supple. no JVD. Carotids 2+ bilat; no bruits. No lymphadenopathy or thryomegaly appreciated. Cor: PMI nondisplaced. Regular rate & rhythm. No rubs, gallops or murmurs. Lungs: clear Abdomen: soft, nontender, nondistended. No hepatosplenomegaly. No bruits or masses. Good bowel sounds. Extremities: no cyanosis, clubbing, rash, tr edema Neuro: alert & orientedx3, cranial nerves grossly intact. moves all 4 extremities w/o difficulty. Affect pleasant  ECG: NSR 76 No ST-T wave abnormalities. Personally reviewed  ASSESSMENT & PLAN:  1. Right Breast Cancer - stage IV - Treated for AML 09/1998 treated with 2 induction regimens (including doxrubicin) followed by 6 consolidation regimens, remission  - Diagnosed with R breast CA DCIS in 3/20 ER/PR+  Underwent lumpectomy Treated  with tamoxifen  and XRT - Relapse in 9/22  - 9/23 found to have Stage IV CA in 3/24 started DOCEtaxel  + Trastuzumab  + Pertuzumab  (THP) q21d x 8 cycles / Trastuzumab  + Pertuzumab  q21d x 4 cycles  - remains on Herceptin /Perjeta / Tolerating well but having some SOB for 2-3 days after every infusion. Will start low-dose spiro to see if volume removal helps  2. Potential chemotherapy-induced cardiomyopathy - LV function low end of normal range -  ECHO:  7/24 EF 55-60% GLS -18.0  - ECHO 10/01/23 50-55% GLS -13.4 (poor tracking) - POCUS echo t12/27/24 in clinic EF stable, 50-55%  - Echo 01/30/24 EF 55-60% GLS -19.2%  - Echo 9/25 EF 50-55% GLS -14.1%  - Echo today 10/06/24 55-60% GLS -14.3% - Doing well NYHA I.  - Continue Entresto  to 97/103 bid - Add spiro 12.5 as above - Off Toprol  due to fatigue - EF and strain remain on low end of normal to normal range. Very stable. Continue H/P  - Continue surveillance echos  3. Hypertension  - Blood pressure well controlled. Add spiro 12.5 as above  4. Fatigue/snoring  - unable to complete sleep study - will retry  5. Screening for CAD - Cardiac CT 04/26/24. CAC 0  Toribio Fuel, MD  9:35 AM

## 2024-10-06 NOTE — Addendum Note (Signed)
 Encounter addended by: Buell Powell HERO, RN on: 10/06/2024 11:26 AM  Actions taken: Clinical Note Signed

## 2024-10-06 NOTE — Addendum Note (Signed)
 Encounter addended by: Buell Powell HERO, RN on: 10/06/2024 10:02 AM  Actions taken: Clinical Note Signed

## 2024-10-13 DIAGNOSIS — Z0000 Encounter for general adult medical examination without abnormal findings: Secondary | ICD-10-CM | POA: Diagnosis not present

## 2024-10-13 DIAGNOSIS — T451X5A Adverse effect of antineoplastic and immunosuppressive drugs, initial encounter: Secondary | ICD-10-CM | POA: Diagnosis not present

## 2024-10-13 DIAGNOSIS — R7301 Impaired fasting glucose: Secondary | ICD-10-CM | POA: Diagnosis not present

## 2024-10-13 DIAGNOSIS — E538 Deficiency of other specified B group vitamins: Secondary | ICD-10-CM | POA: Diagnosis not present

## 2024-10-13 DIAGNOSIS — E7800 Pure hypercholesterolemia, unspecified: Secondary | ICD-10-CM | POA: Diagnosis not present

## 2024-10-13 DIAGNOSIS — I427 Cardiomyopathy due to drug and external agent: Secondary | ICD-10-CM | POA: Diagnosis not present

## 2024-10-14 ENCOUNTER — Inpatient Hospital Stay: Attending: Hematology and Oncology

## 2024-10-14 ENCOUNTER — Encounter: Payer: Self-pay | Admitting: Hematology and Oncology

## 2024-10-14 ENCOUNTER — Encounter: Payer: Self-pay | Admitting: Licensed Clinical Social Worker

## 2024-10-14 VITALS — BP 121/80 | HR 75 | Temp 98.9°F | Resp 15 | Wt 172.5 lb

## 2024-10-14 DIAGNOSIS — D0511 Intraductal carcinoma in situ of right breast: Secondary | ICD-10-CM | POA: Insufficient documentation

## 2024-10-14 DIAGNOSIS — Z79899 Other long term (current) drug therapy: Secondary | ICD-10-CM | POA: Diagnosis not present

## 2024-10-14 DIAGNOSIS — Z5112 Encounter for antineoplastic immunotherapy: Secondary | ICD-10-CM | POA: Insufficient documentation

## 2024-10-14 DIAGNOSIS — C50919 Malignant neoplasm of unspecified site of unspecified female breast: Secondary | ICD-10-CM

## 2024-10-14 DIAGNOSIS — C787 Secondary malignant neoplasm of liver and intrahepatic bile duct: Secondary | ICD-10-CM | POA: Diagnosis not present

## 2024-10-14 MED ORDER — DIPHENHYDRAMINE HCL 25 MG PO CAPS
50.0000 mg | ORAL_CAPSULE | Freq: Once | ORAL | Status: AC
  Administered 2024-10-14: 50 mg via ORAL
  Filled 2024-10-14: qty 2

## 2024-10-14 MED ORDER — SODIUM CHLORIDE 0.9 % IV SOLN: INTRAVENOUS | Status: DC

## 2024-10-14 MED ORDER — ACETAMINOPHEN 325 MG PO TABS
650.0000 mg | ORAL_TABLET | Freq: Once | ORAL | Status: AC
  Administered 2024-10-14: 650 mg via ORAL
  Filled 2024-10-14: qty 2

## 2024-10-14 MED ORDER — TRASTUZUMAB-ANNS CHEMO 150 MG IV SOLR
6.0000 mg/kg | Freq: Once | INTRAVENOUS | Status: AC
  Administered 2024-10-14: 483 mg via INTRAVENOUS
  Filled 2024-10-14: qty 23

## 2024-10-14 MED ORDER — SODIUM CHLORIDE 0.9 % IV SOLN
420.0000 mg | Freq: Once | INTRAVENOUS | Status: AC
  Administered 2024-10-14: 420 mg via INTRAVENOUS
  Filled 2024-10-14: qty 14

## 2024-10-14 NOTE — Patient Instructions (Signed)
 [facility] - A DEPT OF [place]  Discharge Instructions: Thank you for choosing [facility] to provide your oncology and hematology care.   If you have a lab appointment with the Cancer Center, please go directly to the Cancer Center and check in at the registration area.   Wear comfortable clothing and clothing appropriate for easy access to any Portacath or PICC line.   We strive to give you quality time with your provider. You may need to reschedule your appointment if you arrive late (15 or more minutes).  Arriving late affects you and other patients whose appointments are after yours.  Also, if you miss three or more appointments without notifying the office, you may be dismissed from the clinic at the provider's discretion.      For prescription refill requests, have your pharmacy contact our office and allow 72 hours for refills to be completed.    Today you received the following chemotherapy and/or immunotherapy agents: Trastuzumab, Pertuzumab      To help prevent nausea and vomiting after your treatment, we encourage you to take your nausea medication as directed.  BELOW ARE SYMPTOMS THAT SHOULD BE REPORTED IMMEDIATELY: *FEVER GREATER THAN 100.4 F (38 C) OR HIGHER *CHILLS OR SWEATING *NAUSEA AND VOMITING THAT IS NOT CONTROLLED WITH YOUR NAUSEA MEDICATION *UNUSUAL SHORTNESS OF BREATH *UNUSUAL BRUISING OR BLEEDING *URINARY PROBLEMS (pain or burning when urinating, or frequent urination) *BOWEL PROBLEMS (unusual diarrhea, constipation, pain near the anus) TENDERNESS IN MOUTH AND THROAT WITH OR WITHOUT PRESENCE OF ULCERS (sore throat, sores in mouth, or a toothache) UNUSUAL RASH, SWELLING OR PAIN  UNUSUAL VAGINAL DISCHARGE OR ITCHING   Items with * indicate a potential emergency and should be followed up as soon as possible or go to the Emergency Department if any problems should occur.  Please show the CHEMOTHERAPY ALERT CARD or  IMMUNOTHERAPY ALERT CARD at check-in to the Emergency Department and triage nurse.  Should you have questions after your visit or need to cancel or reschedule your appointment, please contact [facility] - A DEPT OF [facility]  Dept: [phone]  and follow the prompts.  Office hours are 8:00 a.m. to 4:30 p.m. Monday - Friday. Please note that voicemails left after 4:00 p.m. may not be returned until the following business day.  We are closed weekends and major holidays. You have access to a nurse at all times for urgent questions. Please call the main number to the clinic Dept: [phone] and follow the prompts.   For any non-urgent questions, you may also contact your provider using MyChart. We now offer e-Visits for anyone 41 and older to request care online for non-urgent symptoms. For details visit [email or web address].   Also download the MyChart app! Go to the app store, search MyChart, open the app, select [place], and log in with your MyChart username and password.

## 2024-10-14 NOTE — Progress Notes (Signed)
Patient declined post pertuzumab observation.  Tolerated treatment well without incident.  VSS at discharge.  Ambulated to lobby.

## 2024-10-14 NOTE — Progress Notes (Signed)
 [facility] Clinical Social Work  Clinical Social Work was referred by engineer, civil (consulting) for insurance questions.  Clinical Social Worker contacted patient by phone to offer support and assess for needs.    Patient previously has had insurance through [employer]. This year, she was approved for Medicaid and now has [employer]. She had questions about if she needs a second insurance as well and if the doctors and medications are covered. CSW answered to the best of my ability.  No further questions at this time.      Moustapha Tooker E Kierah Goatley, LCSW  Clinical Social Worker [facility]        Patient is participating in a Managed Medicaid Plan:  Yes

## 2024-10-29 ENCOUNTER — Encounter (INDEPENDENT_AMBULATORY_CARE_PROVIDER_SITE_OTHER): Payer: Self-pay | Admitting: Otolaryngology

## 2024-10-29 ENCOUNTER — Ambulatory Visit (INDEPENDENT_AMBULATORY_CARE_PROVIDER_SITE_OTHER): Admitting: Otolaryngology

## 2024-10-29 VITALS — BP 133/88 | HR 78 | Temp 97.5°F | Ht 65.0 in | Wt 177.0 lb

## 2024-10-29 DIAGNOSIS — H6981 Other specified disorders of Eustachian tube, right ear: Secondary | ICD-10-CM | POA: Diagnosis not present

## 2024-10-29 DIAGNOSIS — J342 Deviated nasal septum: Secondary | ICD-10-CM | POA: Insufficient documentation

## 2024-10-29 DIAGNOSIS — H6521 Chronic serous otitis media, right ear: Secondary | ICD-10-CM | POA: Diagnosis not present

## 2024-10-29 DIAGNOSIS — J019 Acute sinusitis, unspecified: Secondary | ICD-10-CM | POA: Diagnosis not present

## 2024-10-29 DIAGNOSIS — J343 Hypertrophy of nasal turbinates: Secondary | ICD-10-CM

## 2024-10-29 DIAGNOSIS — D329 Benign neoplasm of meninges, unspecified: Secondary | ICD-10-CM

## 2024-10-29 DIAGNOSIS — J0101 Acute recurrent maxillary sinusitis: Secondary | ICD-10-CM | POA: Insufficient documentation

## 2024-10-29 MED ORDER — CIPROFLOXACIN HCL 500 MG PO TABS: 500.0000 mg | ORAL_TABLET | Freq: Two times a day (BID) | ORAL | 0 refills | Status: AC

## 2024-10-29 MED ORDER — PREDNISONE 10 MG (21) PO TBPK: ORAL_TABLET | ORAL | 0 refills | Status: DC

## 2024-10-29 NOTE — Progress Notes (Signed)
 Patient ID: Joann Meadows, female   DOB: May 17, 1976, 48 y.o.   MRN: [id number]  CC: Acute sinus infection, right ear eustachian tube dysfunction  History of Present Illness Joann Meadows is a 48 year old female with chronic right ear eustachian tube dysfunction who presents with sinus infection and ear pain.  She has persistent issues with her right ear, including an inability to 'pop' it, which has been a chronic problem even when not infected. Recently, she developed a sinus infection while on a cruise, which was treated with a seven-day course of Augmentin . Despite completing the course, she continues to experience symptoms, particularly in her right ear, which worsened during her flight back from the cruise.  Her symptoms include ear pain in the right ear, which worsened during air travel. She was informed by a doctor on the cruise that she had infections in her throat and ear, prompting the initiation of antibiotics. She has three days remaining of a ten-day antibiotic course.  Her medical history includes meningioma, requiring MRI scan every 3 months.  Her recent MRI scan showed bilateral maxillary mucous retention cysts. She also has a history of deviated septum and enlarged turbinates.  Current medications include Flonase and Claritin.  Exam: General: Communicates without difficulty, well nourished, no acute distress. Head: Normocephalic, no evidence injury, no tenderness, facial buttresses intact without stepoff. Face/sinus: No tenderness to palpation and percussion. Facial movement is normal and symmetric. Eyes: PERRL, EOMI. No scleral icterus, conjunctivae clear. Neuro: CN II exam reveals vision grossly intact.  No nystagmus at any point of gaze. Ears: Auricles well formed without lesions.  Ear canals are intact without mass or lesion.   The TMs are intact with right middle ear effusion.  Nose: External evaluation reveals normal support and skin without lesions.  Dorsum is intact.   Anterior rhinoscopy reveals edematous and erythematous mucosa over anterior aspect of inferior turbinates and deviated septum.  Oral:  Oral cavity and oropharynx are intact, symmetric, without erythema or edema.  Mucosa is moist without lesions. Neck: Full range of motion without pain.  There is no significant lymphadenopathy.  No masses palpable.  Thyroid  bed within normal limits to palpation.  Parotid glands and submandibular glands equal bilaterally without mass.  Trachea is midline. Neuro:  CN 2-12 grossly intact.    Procedure:  Flexible Nasal Endoscopy: Description: Risks, benefits, and alternatives of flexible endoscopy were explained to the patient.  Specific mention was made of the risk of throat numbness with difficulty swallowing, possible bleeding from the nose and mouth, and pain from the procedure.  The patient gave oral consent to proceed.  The flexible scope was inserted into the right nasal cavity.  Endoscopy of the interior nasal cavity, superior, inferior, and middle meatus was performed. The sphenoid-ethmoid recess was examined. Edematous mucosa was noted.  Purulent drainage noted. Nasal septal deviation noted. Olfactory cleft was clear.  Nasopharynx was clear.  Turbinates were hypertrophied but without mass.  The procedure was repeated on the contralateral side with similar findings.  The patient tolerated the procedure well.   Assessment & Plan Acute bacterial sinusitis Significant nasal congestion and purulent discharge. Examination revealed a deviated septum and turbinate hypertrophy, contributing to symptoms.  - Prescribed ciprofloxacin  500 mg orally twice daily for 10 days.  Possible side effects discussed. - Prescribed prednisone  for 6 days to reduce nasal swelling - Continue Flonase nasal spray daily - Use Claritin as needed for allergy symptoms  Right ear eustachian tube dysfunction with effusion Chronic right  ear eustachian tube dysfunction with persistent effusion,  exacerbated by recent sinus infection. Examination confirmed fluid presence in the right ear. Nasal congestion likely contributes to eustachian tube obstruction. - Continue Flonase nasal spray daily - Use Claritin as needed for allergy symptoms  Deviated nasal septum with turbinate hypertrophy Contributing to nasal congestion and eustachian tube dysfunction. Current nasal spray treatment is insufficient. - Will consider surgical correction of deviated septum and turbinate hypertrophy after resolution of current infection  1.1 cm anterior right meningioma. - The patient is having MRI scan every 3 months to evaluate her meningioma.

## 2024-10-30 ENCOUNTER — Telehealth (INDEPENDENT_AMBULATORY_CARE_PROVIDER_SITE_OTHER): Payer: Self-pay | Admitting: Otolaryngology

## 2024-10-30 NOTE — Telephone Encounter (Addendum)
 Patient called reporting that she started the pred/cipro  and is now having high BP 160/100 and broke out in a rash. No trouble swallowing, talking, or respiratory distress. No end organ sx related to high BP currently. Advised to stop the cipro /pred (has only take pred for 1 day, 60mg  it appears) and can take benadryl  as needed. She will call back Monday to let us  know how she is doing. Return/ED precautions discussed.  Chritopher Coster B Absalom Aro

## 2024-11-01 ENCOUNTER — Other Ambulatory Visit: Payer: Self-pay

## 2024-11-01 ENCOUNTER — Emergency Department (HOSPITAL_COMMUNITY)

## 2024-11-01 ENCOUNTER — Telehealth (INDEPENDENT_AMBULATORY_CARE_PROVIDER_SITE_OTHER): Payer: Self-pay | Admitting: Otolaryngology

## 2024-11-01 ENCOUNTER — Encounter (HOSPITAL_COMMUNITY): Payer: Self-pay | Admitting: Emergency Medicine

## 2024-11-01 ENCOUNTER — Other Ambulatory Visit (INDEPENDENT_AMBULATORY_CARE_PROVIDER_SITE_OTHER): Payer: Self-pay | Admitting: Otolaryngology

## 2024-11-01 ENCOUNTER — Emergency Department (HOSPITAL_COMMUNITY)
Admission: EM | Admit: 2024-11-01 | Discharge: 2024-11-01 | Disposition: A | Discharge status: Home / Self Care | Attending: Emergency Medicine | Admitting: Emergency Medicine

## 2024-11-01 DIAGNOSIS — R202 Paresthesia of skin: Secondary | ICD-10-CM | POA: Diagnosis not present

## 2024-11-01 DIAGNOSIS — R220 Localized swelling, mass and lump, head: Secondary | ICD-10-CM | POA: Diagnosis not present

## 2024-11-01 DIAGNOSIS — R531 Weakness: Secondary | ICD-10-CM | POA: Diagnosis not present

## 2024-11-01 DIAGNOSIS — R42 Dizziness and giddiness: Secondary | ICD-10-CM

## 2024-11-01 DIAGNOSIS — J320 Chronic maxillary sinusitis: Secondary | ICD-10-CM

## 2024-11-01 DIAGNOSIS — Z853 Personal history of malignant neoplasm of breast: Secondary | ICD-10-CM | POA: Diagnosis not present

## 2024-11-01 DIAGNOSIS — D329 Benign neoplasm of meninges, unspecified: Secondary | ICD-10-CM | POA: Diagnosis not present

## 2024-11-01 DIAGNOSIS — H5500 Unspecified nystagmus: Secondary | ICD-10-CM | POA: Diagnosis not present

## 2024-11-01 LAB — TROPONIN T, HIGH SENSITIVITY
Troponin T High Sensitivity: <15 ng/L (ref 0–19)
Troponin T High Sensitivity: <15 ng/L (ref 0–19)

## 2024-11-01 LAB — COMPREHENSIVE METABOLIC PANEL WITH GFR
ALT: 16 U/L (ref 0–44)
AST: 13 U/L — ABNORMAL LOW (ref 15–41)
Albumin: 4.2 g/dL (ref 3.5–5.0)
Alkaline Phosphatase: 70 U/L (ref 38–126)
Anion gap: 9 (ref 5–15)
BUN: 16 mg/dL (ref 6–20)
CO2: 26 mmol/L (ref 22–32)
Calcium: 8.7 mg/dL — ABNORMAL LOW (ref 8.9–10.3)
Chloride: 103 mmol/L (ref 98–111)
Creatinine, Ser: 0.66 mg/dL (ref 0.44–1.00)
GFR, Estimated: >60 mL/min (ref >60)
Glucose, Bld: 96 mg/dL (ref 70–99)
Potassium: 3.6 mmol/L (ref 3.5–5.1)
Sodium: 138 mmol/L (ref 135–145)
Total Bilirubin: 0.2 mg/dL (ref 0.0–1.2)
Total Protein: 6.9 g/dL (ref 6.5–8.1)

## 2024-11-01 LAB — URINALYSIS, ROUTINE W REFLEX MICROSCOPIC
Bilirubin Urine: NEGATIVE
Glucose, UA: NEGATIVE mg/dL
Hgb urine dipstick: NEGATIVE
Ketones, ur: NEGATIVE mg/dL
Leukocytes,Ua: NEGATIVE
Nitrite: NEGATIVE
Protein, ur: NEGATIVE mg/dL
Specific Gravity, Urine: 1.001 — ABNORMAL LOW (ref 1.005–1.030)
pH: 6 (ref 5.0–8.0)

## 2024-11-01 LAB — CBC
HCT: 38.7 % (ref 36.0–46.0)
Hemoglobin: 12.6 g/dL (ref 12.0–15.0)
MCH: 29.2 pg (ref 26.0–34.0)
MCHC: 32.6 g/dL (ref 30.0–36.0)
MCV: 89.8 fL (ref 80.0–100.0)
Platelets: 252 K/uL (ref 150–400)
RBC: 4.31 MIL/uL (ref 3.87–5.11)
RDW: 14 % (ref 11.5–15.5)
WBC: 9.1 K/uL (ref 4.0–10.5)
nRBC: 0 % (ref 0.0–0.2)

## 2024-11-01 LAB — CBG MONITORING, ED: Glucose-Capillary: 97 mg/dL (ref 70–99)

## 2024-11-01 LAB — HCG, SERUM, QUALITATIVE: Preg, Serum: NEGATIVE

## 2024-11-01 MED ORDER — AZITHROMYCIN 250 MG PO TABS: ORAL_TABLET | ORAL | 0 refills | Status: AC

## 2024-11-01 MED ORDER — HEPARIN SOD (PORK) LOCK FLUSH 100 UNIT/ML IV SOLN
500.0000 [IU] | Freq: Once | INTRAVENOUS | Status: DC
  Filled 2024-11-01: qty 5

## 2024-11-01 MED ORDER — GADOBUTROL 1 MMOL/ML IV SOLN
8.0000 mL | Freq: Once | INTRAVENOUS | Status: AC | PRN
  Administered 2024-11-01: 8 mL via INTRAVENOUS

## 2024-11-01 NOTE — ED Notes (Signed)
 Patient stated she want her port to be access for blood draw.

## 2024-11-01 NOTE — ED Triage Notes (Signed)
 Patient c/o dizziness and weakness started this morning. Patient report her BP was elevated at home. Patient report nausea, denies vomiting and diarrhea. Patient report chest discomfort, denies SOB. Hx Breast Ca with mets to liver, last chemo 11/20

## 2024-11-01 NOTE — Discharge Instructions (Addendum)
 Please let the ear nose and throat doctor family doctor and your cancer doctor know about your visit today.  Luckily your MRI looks okay.  Please return for sudden worsening dizziness headache one-sided numbness or weakness or difficulty with speech or swallowing.

## 2024-11-01 NOTE — Telephone Encounter (Signed)
 Patient called and stated she had a rash on her neck and face. She was still having congestion . Dr. Karis send in Z-pack and ordered a sinus CT. Patient is understood.

## 2024-11-01 NOTE — ED Provider Notes (Signed)
 Received patient in turnover from Dr. Patsey.  Please see their note for further details of Hx, PE.  Briefly patient is a 48 y.o. female with a Weakness and Dizziness .  L sided paresthesias, sinusitis.  Hx of breast CA.  Getting MRI if negative likely home,  MRI resulted and is negative.  On my review patient just started on Cipro  a few days ago.  Will have her discuss antibiotic changes with her ENT.    Emil Share, DO 11/01/24 1721

## 2024-11-01 NOTE — ED Provider Notes (Signed)
 [place] EMERGENCY DEPARTMENT AT [facility] Provider Note   CSN: [id number] Arrival date & time: 11/01/24  1334     Patient presents with: Weakness and Dizziness   Joann Meadows is a 48 y.o. female.    Weakness Associated symptoms: dizziness   Dizziness Associated symptoms: weakness   Patient presents with dizziness and weakness.  Feels unsteady.  Feels almost as if she is drunk.  Afebrile last couple days.  No nausea.  No vomiting.  History of metastatic breast cancer.  Also recently treated for sinusitis and eustachian tube dysfunction.  Had been on Augmentin  and to start Cipro  and prednisone  although had a reported reaction and stopped that with no new medicines.  That was her right ear.  Has seen Dr. Karis.  He does feel that there is a tingling on the left side of her head.  Reviewing notes it appears she has a meningioma and also has stage IV breast cancer.     Prior to Admission medications   Medication Sig Start Date End Date Taking? Authorizing Provider  acetaminophen  (TYLENOL ) 325 MG tablet Take 650 mg by mouth every 6 (six) hours as needed.    [provider]  ALPRAZolam (XANAX) 0.25 MG tablet  01/14/23   [provider]  cholecalciferol (VITAMIN D3) 25 MCG (1000 UNIT) tablet Take 2,000 Units by mouth daily.    [provider]  ciprofloxacin  (CIPRO ) 500 MG tablet Take 1 tablet (500 mg total) by mouth 2 (two) times daily for 10 days. 10/29/24 11/08/24  Karis Clunes, MD  Cyanocobalamin  (B-12) 500 MCG TABS 500 mg.    [provider]  fluticasone (FLONASE) 50 MCG/ACT nasal spray Place 1 spray into both nostrils daily.    [provider]  Ibuprofen  (ADVIL ) 200 MG CAPS Take 1 capsule by mouth as needed.    [provider]  loratadine (CLARITIN) 10 MG tablet 1 tablet Orally Once a day    [provider]  Magnesium Oxide -Mg Supplement 200 MG TABS Take 200 mg by mouth daily.    [provider]   phenylephrine  (SUDAFED PE) 10 MG TABS tablet Take 10 mg by mouth as needed.    [provider]  predniSONE  (STERAPRED UNI-PAK 21 TAB) 10 MG (21) TBPK tablet Per instructions (6,5,4,3,2,1) 10/29/24   Karis Clunes, MD  rosuvastatin (CRESTOR) 5 MG tablet Take 5 mg by mouth daily. 05/24/24   [provider]  sacubitril -valsartan  (ENTRESTO ) 97-103 MG Take 1 tablet by mouth 2 (two) times daily. 05/21/24   Bensimhon, Toribio SAUNDERS, MD  spironolactone  (ALDACTONE ) 25 MG tablet Take 0.5 tablets (12.5 mg total) by mouth daily. 10/06/24   Bensimhon, Toribio SAUNDERS, MD  Zinc-Vitamin C (ZINC-A-COLD/VITAMIN C MT) Use as directed 1 capsule in the mouth or throat daily.    [provider]    Allergies: Levaquin  [levofloxacin ], Tamoxifen  citrate, and Tape    Review of Systems  Neurological:  Positive for dizziness and weakness.    Updated Vital Signs BP 138/80   Pulse 75   Temp (!) 97.5 F (36.4 C)   Resp 16   SpO2 100%   Physical Exam Vitals and nursing note reviewed.  HENT:     Head: Atraumatic.     Left Ear: Tympanic membrane normal.     Ears:     Comments: May have a small amount of fluid behind right TM.  No bulging.  No erythema. Eyes:     Extraocular Movements: Extraocular movements intact.  Comments: Mild nystagmus with gaze to right.  Cardiovascular:     Rate and Rhythm: Regular rhythm.  Abdominal:     Tenderness: There is no abdominal tenderness.  Musculoskeletal:        General: No tenderness.  Neurological:     Mental Status: She is alert.     Comments: Face symmetric.  Eye movements intact with some mild nystagmus.  Finger-nose intact bilaterally.  No Romberg.  Appears normal gait.     (all labs ordered are listed, but only abnormal results are displayed) Labs Reviewed  COMPREHENSIVE METABOLIC PANEL WITH GFR  CBC  URINALYSIS, ROUTINE W REFLEX MICROSCOPIC  HCG, SERUM, QUALITATIVE  CBG MONITORING, ED  TROPONIN T, HIGH SENSITIVITY     EKG: None  Radiology: No results found.   Procedures   Medications Ordered in the ED - No data to display                                  Medical Decision Making Amount and/or Complexity of Data Reviewed Labs: ordered. Radiology: ordered.  Risk Prescription drug management.   Patient with unsteadiness dizziness.  Reviewing notes does have eustachian tube dysfunction along with sinusitis.  Could be the cause but also has known meningioma and stage IV breast cancer.  Last MRI was around 3 months ago.  With also the paresthesias on left side of head I feel as if this would warrant further evaluation with MRI.  Will also get basic blood work.  Differential diagnosis does include primary cause such as peripheral vertigo but also metastatic cancer considered.   Blood work reassuring.  Care turned over to Dr. Emil. MRI pending     Final diagnoses:  None    ED Discharge Orders     None          Patsey Lot, MD 11/01/24 [phone]

## 2024-11-01 NOTE — Telephone Encounter (Signed)
 The patient had a bad reaction to the medication this weekend, she spoke to the on-call dr Saturday.  She has stopped the medication per the doctor on call.  Please advise as she is still sick.

## 2024-11-04 ENCOUNTER — Inpatient Hospital Stay

## 2024-11-04 ENCOUNTER — Encounter: Payer: Self-pay | Admitting: Adult Health

## 2024-11-04 ENCOUNTER — Inpatient Hospital Stay: Admitting: Adult Health

## 2024-11-04 ENCOUNTER — Inpatient Hospital Stay: Attending: Hematology and Oncology

## 2024-11-04 VITALS — BP 130/74 | HR 70 | Resp 17

## 2024-11-04 VITALS — BP 127/82 | HR 79 | Temp 97.5°F | Resp 16 | Ht 65.0 in | Wt 176.5 lb

## 2024-11-04 DIAGNOSIS — Z5112 Encounter for antineoplastic immunotherapy: Secondary | ICD-10-CM | POA: Insufficient documentation

## 2024-11-04 DIAGNOSIS — C50919 Malignant neoplasm of unspecified site of unspecified female breast: Secondary | ICD-10-CM

## 2024-11-04 DIAGNOSIS — Z9011 Acquired absence of right breast and nipple: Secondary | ICD-10-CM | POA: Diagnosis not present

## 2024-11-04 DIAGNOSIS — J0101 Acute recurrent maxillary sinusitis: Secondary | ICD-10-CM

## 2024-11-04 DIAGNOSIS — Z79899 Other long term (current) drug therapy: Secondary | ICD-10-CM | POA: Insufficient documentation

## 2024-11-04 DIAGNOSIS — Z923 Personal history of irradiation: Secondary | ICD-10-CM | POA: Insufficient documentation

## 2024-11-04 DIAGNOSIS — C787 Secondary malignant neoplasm of liver and intrahepatic bile duct: Secondary | ICD-10-CM | POA: Diagnosis present

## 2024-11-04 DIAGNOSIS — Z1721 Progesterone receptor positive status: Secondary | ICD-10-CM | POA: Insufficient documentation

## 2024-11-04 DIAGNOSIS — Z87891 Personal history of nicotine dependence: Secondary | ICD-10-CM | POA: Insufficient documentation

## 2024-11-04 DIAGNOSIS — D0511 Intraductal carcinoma in situ of right breast: Secondary | ICD-10-CM | POA: Insufficient documentation

## 2024-11-04 DIAGNOSIS — Z170 Estrogen receptor positive status [ER+]: Secondary | ICD-10-CM | POA: Diagnosis not present

## 2024-11-04 MED ORDER — ACETAMINOPHEN 325 MG PO TABS
650.0000 mg | ORAL_TABLET | Freq: Once | ORAL | Status: AC
  Administered 2024-11-04: 650 mg via ORAL
  Filled 2024-11-04: qty 2

## 2024-11-04 MED ORDER — SODIUM CHLORIDE 0.9 % IV SOLN: INTRAVENOUS | Status: DC

## 2024-11-04 MED ORDER — SODIUM CHLORIDE 0.9 % IV SOLN
420.0000 mg | Freq: Once | INTRAVENOUS | Status: AC
  Administered 2024-11-04: 420 mg via INTRAVENOUS
  Filled 2024-11-04: qty 14

## 2024-11-04 MED ORDER — DIPHENHYDRAMINE HCL 25 MG PO CAPS
50.0000 mg | ORAL_CAPSULE | Freq: Once | ORAL | Status: AC
  Administered 2024-11-04: 50 mg via ORAL
  Filled 2024-11-04: qty 2

## 2024-11-04 MED ADMIN — TRASTUZUMAB-ANNS CHEMO IV INFUSION: 483 mg | INTRAVENOUS | NDC 55513013201

## 2024-11-04 MED FILL — Trastuzumab-anns For IV Soln 420 MG: 6.0000 mg/kg | INTRAVENOUS | Qty: 23 | Status: AC

## 2024-11-04 NOTE — Progress Notes (Unsigned)
  Cancer Center Cancer Follow up:    Joann Slater, MD [address] [place] [address]   DIAGNOSIS: Cancer Staging  Breast cancer metastasized to liver [facility]) Staging form: Breast, AJCC 8th Edition - Clinical: Stage IV (cT55mi, cNX, cM1, G3, ER: Unknown, PR: Unknown, HER2: Unknown) - Signed by [name], MD on 02/06/2023 Histologic grading system: 3 grade system    SUMMARY OF ONCOLOGIC HISTORY: Oncology History  Breast cancer metastasized to liver (HCC)  01/25/2019 Initial Diagnosis   Screening mammogram showed bilateral calcifications right breast 5 mm, pleomorphic.  Left breast 1.6 cm punctate, smaller group of calcifications spanning 3 mm.  Right lower outer quadrant biopsy revealed low-grade DCIS ER 90%, PR 70%; biopsy of left breast calcifications ALH and PASH   01/31/2019 Genetic Testing   ATM c.8495G>A and RECQL4 c.2587G>A VUS identified on the 9-gene STAT panel and Multi-cancer panel through [facility].  The STAT Breast cancer panel offered by [facility] includes sequencing and rearrangement analysis for the following 9 genes:  ATM, BRCA1, BRCA2, CDH1, CHEK2, PALB2, PTEN, STK11 and TP53.   The Multi-Gene Panel offered by [facility] includes sequencing and/or deletion duplication testing of the following 84 genes: AIP, ALK, APC, ATM, AXIN2,BAP1,  BARD1, BLM, BMPR1A, BRCA1, BRCA2, BRIP1, CASR, CDC73, CDH1, CDK4, CDKN1B, CDKN1C, CDKN2A (p14ARF), CDKN2A (p16INK4a), CEBPA, CHEK2, CTNNA1, DICER1, DIS3L2, EGFR (c.2369C>T, p.Thr790Met variant only), EPCAM (Deletion/duplication testing only), FH, FLCN, GATA2, GPC3, GREM1 (Promoter region deletion/duplication testing only), HOXB13 (c.251G>A, p.Gly84Glu), HRAS, KIT, MAX, MEN1, MET, MITF (c.952G>A, p.Glu318Lys variant only), MLH1, MSH2, MSH3, MSH6, MUTYH, NBN, NF1, NF2, NTHL1, PALB2, PDGFRA, PHOX2B, PMS2, POLD1, POLE, POT1, PRKAR1A, PTCH1, PTEN, RAD50, RAD51C, RAD51D, RB1, RECQL4, RET, RUNX1, SDHAF2, SDHA (sequence changes  only), SDHB, SDHC, SDHD, SMAD4, SMARCA4, SMARCB1, SMARCE1, STK11, SUFU, TERC, TERT, TMEM127, TP53, TSC1, TSC2, VHL, WRN and WT1.  he report date is February 02, 2019.   02/11/2019 Surgery   RT lumpectomy: DICS Intermediate grade, Margins Neg, ER 90%, PR 70%; Lt Lumpectomy: UDH   02/26/2019 -  Anti-estrogen oral therapy   Tamoxifen  20mg  daily, stopped because of depression and facial numbness   05/20/2019 - 06/17/2019 Radiation Therapy   Adjuvant XRT   08/15/2021 Relapse/Recurrence   MRI surveillance detected Right breast calcifications 5 cm, Ant biopsy: IG DCIS with necrosis, ER 40%, PR 0%, Posterior biopsy: Intermediate grade DCIS with [facility]   08/19/2022 Cancer Staging   Staging form: Breast, AJCC 8th Edition - Clinical: Stage IV (cT55mi, cNX, cM1, G3, ER: Unknown, PR: Unknown, HER2: Unknown) - Signed by [name], MD on 02/06/2023 Histologic grading system: 3 grade system   02/14/2023 - 01/15/2024 Chemotherapy   Patient is on Treatment Plan : BREAST DOCEtaxel  + Trastuzumab  + Pertuzumab  (THP) q21d x 8 cycles / Trastuzumab  + Pertuzumab  q21d x 4 cycles     02/05/2024 -  Chemotherapy   Patient is on Treatment Plan : BREAST Trastuzumab   + Pertuzumab  q21d x 13 cycles     AML (acute myeloid leukemia) (HCC)  09/1998 Initial Diagnosis   AML (acute myeloid leukemia) (HCC) treated with 2 induction regimens followed by 6 consolidation regimens, remission     CURRENT THERAPY:  INTERVAL HISTORY:  Discussed the use of AI scribe software for clinical note transcription with the patient, who gave verbal consent to proceed.  History of Present Illness      Patient Active Problem List   Diagnosis Date Noted   Acute recurrent maxillary sinusitis 10/29/2024   Right chronic serous otitis media  10/29/2024   Deviated nasal septum 10/29/2024   Hypertrophy of nasal turbinates 10/29/2024   Other specified disorders of eustachian tube, right ear 09/05/2023   Chronic rhinitis 09/05/2023   Swimmer's ear of  right side 09/03/2023   Port-A-Cath in place 03/07/2023   Meningioma (HCC) 01/30/2023   Liver lesion 01/23/2023   COPD possible, would be Gold Group B 11/25/2019   DOE (dyspnea on exertion) 11/24/2019   Encounter for antineoplastic chemotherapy 02/02/2019   Breast cancer metastasized to liver (HCC) 01/25/2019   AML (acute myeloid leukemia) (HCC) 01/25/2019   Family history of colon cancer    Family history of leukemia    Delivery normal 03/25/2017    is allergic to levaquin  [levofloxacin ], tamoxifen  citrate, and tape.  MEDICAL HISTORY: Past Medical History:  Diagnosis Date   Breast cancer (HCC) 2020   Right Breast Cancer   Endometriosis    Fibroid    Hx of radiation therapy 06/2019   Leukemia (HCC)    20 years ago   Personal history of radiation therapy 2020   Right Breast Cancer   PONV (postoperative nausea and vomiting)    pt states she vomits after colonoscopies    SURGICAL HISTORY: Past Surgical History:  Procedure Laterality Date   BREAST EXCISIONAL BIOPSY Left 02/11/2019   [facility]   BREAST LUMPECTOMY Right 02/11/2019   BREAST LUMPECTOMY WITH RADIOACTIVE SEED LOCALIZATION Bilateral 02/11/2019   Procedure: BILATERAL BREAST LUMPECTOMIES WITH BILATERAL RADIOACTIVE SEED LOCALIZATION;  Surgeon: Belinda Cough, MD;  Location: [facility];  Service: General;  Laterality: Bilateral;   IR IMAGING GUIDED PORT INSERTION  02/25/2023   IR RADIOLOGIST EVAL & MGMT  02/27/2023   IR RADIOLOGIST EVAL & MGMT  01/22/2024   IR RADIOLOGIST EVAL & MGMT  01/29/2024   MASTECTOMY Right 11/2021    SOCIAL HISTORY: Social History   Socioeconomic History   Marital status: Single    Spouse name: Not on file   Number of children: 2   Years of education: Not on file   Highest education level: High school graduate  Occupational History   Not on file  Tobacco Use   Smoking status: Former    Current packs/day: 0.00    Types: Cigarettes    Start date: 01/24/2005    Quit date: 01/25/2015     Years since quitting: 9.7   Smokeless tobacco: Never  Vaping Use   Vaping status: Never Used  Substance and Sexual Activity   Alcohol use: No   Drug use: No   Sexual activity: Yes    Birth control/protection: None  Other Topics Concern   Not on file  Social History Narrative   Lives with children   Social Drivers of Health   Tobacco Use: Medium Risk (11/04/2024)   Patient History    Smoking Tobacco Use: Former    Smokeless Tobacco Use: Never    Passive Exposure: Not on Actuary Strain: Low Risk (11/04/2024)   Overall Financial Resource Strain (CARDIA)    Difficulty of Paying Living Expenses: Not hard at all  Food Insecurity: No Food Insecurity (11/04/2024)   Epic    Worried About Programme Researcher, Broadcasting/film/video in the Last Year: Never true    [employer] of Food in the Last Year: Never true  Transportation Needs: No Transportation Needs (11/04/2024)   Epic    Lack of Transportation (Medical): No    Lack of Transportation (Non-Medical): No  Physical Activity: Inactive (11/04/2024)   Exercise Vital Sign  Days of Exercise per Week: 0 days    Minutes of Exercise per Session: 0 min  Stress: No Stress Concern Present (11/04/2024)   [employer] of Occupational Health - Occupational Stress Questionnaire    Feeling of Stress: Only a little  Social Connections: Moderately Isolated (11/04/2024)   Social Connection and Isolation Panel    Frequency of Communication with Friends and Family: Once a week    Frequency of Social Gatherings with Friends and Family: Three times a week    Attends Religious Services: More than 4 times per year    Active Member of Clubs or Organizations: No    Attends Banker Meetings: Never    Marital Status: Never married  Intimate Partner Violence: Not At Risk (11/04/2024)   Epic    Fear of Current or Ex-Partner: No    Emotionally Abused: No    Physically Abused: No    Sexually Abused: No  Depression (PHQ2-9): Low Risk  (11/04/2024)   Depression (PHQ2-9)    PHQ-2 Score: 0  Alcohol Screen: Low Risk (11/04/2024)   Alcohol Screen    Last Alcohol Screening Score (AUDIT): 0  Housing: Unknown (11/04/2024)   Epic    Unable to Pay for Housing in the Last Year: No    Number of Times Moved in the Last Year: Not on file    Homeless in the Last Year: No  Utilities: Not At Risk (11/04/2024)   Epic    Threatened with loss of utilities: No  Health Literacy: Adequate Health Literacy (11/04/2024)   B1300 Health Literacy    Frequency of need for help with medical instructions: Never    FAMILY HISTORY: Family History  Problem Relation Age of Onset   Colon cancer Mother 5       d. 19   Hypertension Father    Diabetes Maternal Aunt    Diabetes Maternal Uncle    Mental retardation Maternal Uncle    Diabetes Paternal Aunt    Diabetes Maternal Grandmother    Stroke Paternal Grandmother        c. 71   Leukemia Paternal Grandfather        d. 29; ?CLL   Leukemia Cousin 4       mat first cousin   Lymphoma Cousin 40       pat first cousin    Review of Systems - Oncology    PHYSICAL EXAMINATION    There were no vitals filed for this visit.  Physical Exam  LABORATORY DATA:  CBC    Component Value Date/Time   WBC 9.1 11/01/2024 1500   RBC 4.31 11/01/2024 1500   HGB 12.6 11/01/2024 1500   HGB 13.0 09/23/2024 0934   HCT 38.7 11/01/2024 1500   PLT 252 11/01/2024 1500   PLT 207 09/23/2024 0934   MCV 89.8 11/01/2024 1500   MCH 29.2 11/01/2024 1500   MCHC 32.6 11/01/2024 1500   RDW 14.0 11/01/2024 1500   LYMPHSABS 2.0 09/23/2024 0934   MONOABS 0.4 09/23/2024 0934   EOSABS 0.2 09/23/2024 0934   BASOSABS 0.1 09/23/2024 0934    CMP     Component Value Date/Time   NA 138 11/01/2024 1500   K 3.6 11/01/2024 1500   CL 103 11/01/2024 1500   CO2 26 11/01/2024 1500   GLUCOSE 96 11/01/2024 1500   BUN 16 11/01/2024 1500   CREATININE 0.66 11/01/2024 1500   CREATININE 0.71 09/23/2024 0934   CALCIUM  8.7 (L) 11/01/2024 1500   PROT  6.9 11/01/2024 1500   ALBUMIN 4.2 11/01/2024 1500   AST 13 (L) 11/01/2024 1500   AST 17 09/23/2024 0934   ALT 16 11/01/2024 1500   ALT 19 09/23/2024 0934   ALKPHOS 70 11/01/2024 1500   BILITOT 0.2 11/01/2024 1500   BILITOT 0.3 09/23/2024 0934   GFRNONAA >60 11/01/2024 1500   GFRNONAA >60 09/23/2024 0934   GFRAA >60 10/27/2019 1528     ASSESSMENT and THERAPY PLAN:   No problem-specific Assessment & Plan notes found for this encounter.   Assessment and Plan Assessment & Plan       All questions were answered. The patient knows to call the clinic with any problems, questions or concerns. We can certainly see the patient much sooner if necessary.  Total encounter time:*** minutes*in face-to-face visit time, chart review, lab review, care coordination, order entry, and documentation of the encounter time.    Morna Kendall, NP 11/04/2024 10:40 AM Medical Oncology and Hematology [facility] [address] [place], [place] [address] Tel. [phone]    Fax. [phone]  *Total Encounter Time as defined by the Centers for Medicare and Medicaid Services includes, in addition to the face-to-face time of a patient visit (documented in the note above) non-face-to-face time: obtaining and reviewing outside history, ordering and reviewing medications, tests or procedures, care coordination (communications with other health care professionals or caregivers) and documentation in the medical record.

## 2024-11-04 NOTE — Progress Notes (Signed)
Pt declined to stay for 30 min post obs, VSS, ambulatory to lobby upon discharge

## 2024-11-04 NOTE — Patient Instructions (Signed)
 [facility] - A DEPT OF [facility]  Discharge Instructions: Thank you for choosing [facility] to provide your oncology and hematology care.   If you have a lab appointment with the Cancer Center, please go directly to the Cancer Center and check in at the registration area.   Wear comfortable clothing and clothing appropriate for easy access to any Portacath or PICC line.   We strive to give you quality time with your provider. You may need to reschedule your appointment if you arrive late (15 or more minutes).  Arriving late affects you and other patients whose appointments are after yours.  Also, if you miss three or more appointments without notifying the office, you may be dismissed from the clinic at the provider's discretion.      For prescription refill requests, have your pharmacy contact our office and allow 72 hours for refills to be completed.    Today you received the following chemotherapy and/or immunotherapy agents: Trastuzumab and Pertuzumab      To help prevent nausea and vomiting after your treatment, we encourage you to take your nausea medication as directed.  BELOW ARE SYMPTOMS THAT SHOULD BE REPORTED IMMEDIATELY: *FEVER GREATER THAN 100.4 F (38 C) OR HIGHER *CHILLS OR SWEATING *NAUSEA AND VOMITING THAT IS NOT CONTROLLED WITH YOUR NAUSEA MEDICATION *UNUSUAL SHORTNESS OF BREATH *UNUSUAL BRUISING OR BLEEDING *URINARY PROBLEMS (pain or burning when urinating, or frequent urination) *BOWEL PROBLEMS (unusual diarrhea, constipation, pain near the anus) TENDERNESS IN MOUTH AND THROAT WITH OR WITHOUT PRESENCE OF ULCERS (sore throat, sores in mouth, or a toothache) UNUSUAL RASH, SWELLING OR PAIN  UNUSUAL VAGINAL DISCHARGE OR ITCHING   Items with * indicate a potential emergency and should be followed up as soon as possible or go to the Emergency Department if any problems should occur.  Please show the CHEMOTHERAPY ALERT CARD  or IMMUNOTHERAPY ALERT CARD at check-in to the Emergency Department and triage nurse.  Should you have questions after your visit or need to cancel or reschedule your appointment, please contact [facility] - A DEPT OF [facility]  Dept: [phone]  and follow the prompts.  Office hours are 8:00 a.m. to 4:30 p.m. Monday - Friday. Please note that voicemails left after 4:00 p.m. may not be returned until the following business day.  We are closed weekends and major holidays. You have access to a nurse at all times for urgent questions. Please call the main number to the clinic Dept: [phone] and follow the prompts.   For any non-urgent questions, you may also contact your provider using MyChart. We now offer e-Visits for anyone 44 and older to request care online for non-urgent symptoms. For details visit [email or web address].   Also download the MyChart app! Go to the app store, search "MyChart", open the app, select [place], and log in with your MyChart username and password.

## 2024-11-05 ENCOUNTER — Encounter: Payer: Self-pay | Admitting: Hematology and Oncology

## 2024-11-08 ENCOUNTER — Encounter: Payer: Self-pay | Admitting: Hematology and Oncology

## 2024-11-08 ENCOUNTER — Telehealth (HOSPITAL_COMMUNITY): Payer: Self-pay | Admitting: Cardiology

## 2024-11-08 ENCOUNTER — Other Ambulatory Visit (HOSPITAL_COMMUNITY): Payer: Self-pay | Admitting: Internal Medicine

## 2024-11-08 ENCOUNTER — Ambulatory Visit (HOSPITAL_COMMUNITY)
Admission: RE | Admit: 2024-11-08 | Discharge: 2024-11-08 | Disposition: A | Source: Ambulatory Visit | Attending: Internal Medicine | Admitting: Internal Medicine

## 2024-11-08 ENCOUNTER — Encounter (HOSPITAL_COMMUNITY): Payer: Self-pay

## 2024-11-08 ENCOUNTER — Ambulatory Visit (HOSPITAL_COMMUNITY)
Admission: RE | Admit: 2024-11-08 | Discharge: 2024-11-08 | Disposition: A | Source: Ambulatory Visit | Attending: Cardiology

## 2024-11-08 VITALS — BP 127/87 | HR 80 | Ht 65.0 in | Wt 179.0 lb

## 2024-11-08 DIAGNOSIS — Z7982 Long term (current) use of aspirin: Secondary | ICD-10-CM | POA: Diagnosis not present

## 2024-11-08 DIAGNOSIS — Z7969 Long term (current) use of other immunomodulators and immunosuppressants: Secondary | ICD-10-CM | POA: Diagnosis not present

## 2024-11-08 DIAGNOSIS — I10 Essential (primary) hypertension: Secondary | ICD-10-CM | POA: Diagnosis not present

## 2024-11-08 DIAGNOSIS — R002 Palpitations: Secondary | ICD-10-CM

## 2024-11-08 DIAGNOSIS — C9201 Acute myeloblastic leukemia, in remission: Secondary | ICD-10-CM | POA: Diagnosis not present

## 2024-11-08 DIAGNOSIS — R42 Dizziness and giddiness: Secondary | ICD-10-CM | POA: Diagnosis not present

## 2024-11-08 DIAGNOSIS — Z923 Personal history of irradiation: Secondary | ICD-10-CM | POA: Diagnosis not present

## 2024-11-08 DIAGNOSIS — C799 Secondary malignant neoplasm of unspecified site: Secondary | ICD-10-CM | POA: Diagnosis not present

## 2024-11-08 DIAGNOSIS — Z9221 Personal history of antineoplastic chemotherapy: Secondary | ICD-10-CM | POA: Diagnosis not present

## 2024-11-08 DIAGNOSIS — C50911 Malignant neoplasm of unspecified site of right female breast: Secondary | ICD-10-CM | POA: Diagnosis not present

## 2024-11-08 DIAGNOSIS — Z79899 Other long term (current) drug therapy: Secondary | ICD-10-CM | POA: Diagnosis not present

## 2024-11-08 DIAGNOSIS — R0683 Snoring: Secondary | ICD-10-CM | POA: Diagnosis not present

## 2024-11-08 DIAGNOSIS — R5383 Other fatigue: Secondary | ICD-10-CM | POA: Diagnosis not present

## 2024-11-08 NOTE — Progress Notes (Addendum)
 CARDIO-ONCOLOGY CLINIC NOTE  Referring Physician: Primary Care: Primary Cardiologist: Dr. Cherrie   Reason for Visit: Dizziness and Palpitations   HPI:  Ms. Rau is 48 y.o. female with breast cancer referred by Dr. Odean for enrollment into the Cardio-Oncology program.  Treated for AML 09/1998 treated with 2 induction regimens (including doxrubicin) followed by 6 consolidation regimens, remission   Diagnosed with R breast CA DCIS in 3/20 ER/PR+  Underwent lumpectomy Treated with tamoxifen  and XRT  Relapse in 9/22   9/23 found to have Stage IV CA in 3/24 started DOCEtaxel  + Trastuzumab  + Pertuzumab  (THP) q21d x 8 cycles / Trastuzumab  + Pertuzumab  q21d x 4 cycles   PET/CT: 06/19/2023: Interval resolution of the hypermetabolic lesions in the liver. PET/CT 10/03/2023: No evidence of recurrent/metastatic cancer low-density liver lesions less conspicuous without any hypermetabolic activity  ECHO:  7/24 EF 55-60% GLS -18.0  ECHO 11/24 50-55% GLS -13.4 (poor tracking)  POCUS ECHO 11/21/23 EF 50-55%  Echo  01/30/24 EF 55-60% GLS -19.2%  Echo EF 6/25 50-55% GLS -14.1%  Echo 09/08/24 EF 55-60% GLS -14.3%  She remains on herceptin  and perjeta . She was seen recently in cardio oncology clinic 10/06/24 and was doing well from cardiac standpoint, NYHA I. Echo showed EF 55-60%, GLS -14.3%.   She has now been added on as a work given given new complaints of dizziness/lightheadedness. She was instructed by her oncologist to be seen back given concerns that symptoms may be cardiac related. Of note, she also was recently treated for sinusitis and eustachian tube dysfunction. Had been on Augmentin , Cipro  and prednisone . Seen in the ED on 12/8 for dizziness. Brain MRI negative for acute intracranial abnormality. HS trop negative x 2. UA negative. BMP showed normal SCr 0.66, BUN 16. EKG showed NSR 66 bpm.   Today in clinic, she reports dizziness on a daily bases and palpitations that correlate w/  dizziness. Occurs randomly. Not associated w/ positional changes. No associated syncope/near syncope or CP. She is currently asymptomatic. She is not hypotensive. We checked orthostatic VS and they were normal.    Past Medical History:  Diagnosis Date   Breast cancer (HCC) 2020   Right Breast Cancer   Endometriosis    Fibroid    Hx of radiation therapy 06/2019   Leukemia (HCC)    20 years ago   Personal history of radiation therapy 2020   Right Breast Cancer   PONV (postoperative nausea and vomiting)    pt states she vomits after colonoscopies    Current Outpatient Medications  Medication Sig Dispense Refill   acetaminophen  (TYLENOL ) 325 MG tablet Take 650 mg by mouth every 6 (six) hours as needed.     ALPRAZolam (XANAX) 0.25 MG tablet      cholecalciferol (VITAMIN D3) 25 MCG (1000 UNIT) tablet Take 2,000 Units by mouth daily.     ciprofloxacin  (CIPRO ) 500 MG tablet Take 1 tablet (500 mg total) by mouth 2 (two) times daily for 10 days. (Patient not taking: Reported on 11/04/2024) 20 tablet 0   Cyanocobalamin  (B-12) 500 MCG TABS 500 mg.     fluticasone (FLONASE) 50 MCG/ACT nasal spray Place 1 spray into both nostrils daily.     Ibuprofen  (ADVIL ) 200 MG CAPS Take 1 capsule by mouth as needed.     loratadine (CLARITIN) 10 MG tablet 1 tablet Orally Once a day     Magnesium Oxide -Mg Supplement 200 MG TABS Take 200 mg by mouth daily.     phenylephrine  (SUDAFED  PE) 10 MG TABS tablet Take 10 mg by mouth as needed.     predniSONE  (STERAPRED UNI-PAK 21 TAB) 10 MG (21) TBPK tablet Per instructions (6,5,4,3,2,1) 21 tablet 0   rosuvastatin (CRESTOR) 5 MG tablet Take 5 mg by mouth daily.     sacubitril -valsartan  (ENTRESTO ) 97-103 MG Take 1 tablet by mouth 2 (two) times daily. 60 tablet 11   spironolactone  (ALDACTONE ) 25 MG tablet Take 0.5 tablets (12.5 mg total) by mouth daily. 15 tablet 3   Zinc-Vitamin C (ZINC-A-COLD/VITAMIN C MT) Use as directed 1 capsule in the mouth or throat daily.      No current facility-administered medications for this encounter.    Allergies  Allergen Reactions   Levaquin  [Levofloxacin ] Swelling    Swelling feet and hands   Tamoxifen  Citrate     Other reaction(s): depression, joint pain   Tape Rash      Social History   Socioeconomic History   Marital status: Single    Spouse name: Not on file   Number of children: 2   Years of education: Not on file   Highest education level: High school graduate  Occupational History   Not on file  Tobacco Use   Smoking status: Former    Current packs/day: 0.00    Types: Cigarettes    Start date: 01/24/2005    Quit date: 01/25/2015    Years since quitting: 9.7   Smokeless tobacco: Never  Vaping Use   Vaping status: Never Used  Substance and Sexual Activity   Alcohol use: No   Drug use: No   Sexual activity: Yes    Birth control/protection: None  Other Topics Concern   Not on file  Social History Narrative   Lives with children   Social Drivers of Health   Tobacco Use: Medium Risk (11/04/2024)   Patient History    Smoking Tobacco Use: Former    Smokeless Tobacco Use: Never    Passive Exposure: Not on Actuary Strain: Low Risk (11/04/2024)   Overall Financial Resource Strain (CARDIA)    Difficulty of Paying Living Expenses: Not hard at all  Food Insecurity: No Food Insecurity (11/04/2024)   Epic    Worried About Radiation Protection Practitioner of Food in the Last Year: Never true    Ran Out of Food in the Last Year: Never true  Transportation Needs: No Transportation Needs (11/04/2024)   Epic    Lack of Transportation (Medical): No    Lack of Transportation (Non-Medical): No  Physical Activity: Inactive (11/04/2024)   Exercise Vital Sign    Days of Exercise per Week: 0 days    Minutes of Exercise per Session: 0 min  Stress: No Stress Concern Present (11/04/2024)   [employer] of Occupational Health - Occupational Stress Questionnaire    Feeling of Stress: Only a little   Social Connections: Moderately Isolated (11/04/2024)   Social Connection and Isolation Panel    Frequency of Communication with Friends and Family: Once a week    Frequency of Social Gatherings with Friends and Family: Three times a week    Attends Religious Services: More than 4 times per year    Active Member of Clubs or Organizations: No    Attends Banker Meetings: Never    Marital Status: Never married  Intimate Partner Violence: Not At Risk (11/04/2024)   Epic    Fear of Current or Ex-Partner: No    Emotionally Abused: No    Physically Abused: No  Sexually Abused: No  Depression (PHQ2-9): Low Risk (11/04/2024)   Depression (PHQ2-9)    PHQ-2 Score: 0  Alcohol Screen: Low Risk (11/04/2024)   Alcohol Screen    Last Alcohol Screening Score (AUDIT): 0  Housing: Unknown (11/04/2024)   Epic    Unable to Pay for Housing in the Last Year: No    Number of Times Moved in the Last Year: Not on file    Homeless in the Last Year: No  Utilities: Not At Risk (11/04/2024)   Epic    Threatened with loss of utilities: No  Health Literacy: Adequate Health Literacy (11/04/2024)   B1300 Health Literacy    Frequency of need for help with medical instructions: Never      Family History  Problem Relation Age of Onset   Colon cancer Mother 81       d. 71   Hypertension Father    Diabetes Maternal Aunt    Diabetes Maternal Uncle    Mental retardation Maternal Uncle    Diabetes Paternal Aunt    Diabetes Maternal Grandmother    Stroke Paternal Grandmother        c. 98   Leukemia Paternal Grandfather        d. 3; ?CLL   Leukemia Cousin 4       mat first cousin   Lymphoma Cousin 40       pat first cousin    There were no vitals filed for this visit.  Physical Exam  GENERAL: NAD Lungs- clear  CARDIAC:  JVP not elevated         Normal rate with regular rhythm. No MRG. No LEE  ABDOMEN: Soft, non-tender, non-distended.  EXTREMITIES: Warm and well perfused.   NEUROLOGIC: No obvious FND   ECG: not performed, personally reviewed ED EKG, NSR 66 bpm   ASSESSMENT & PLAN:  1. Right Breast Cancer - stage IV - Treated for AML 09/1998 treated with 2 induction regimens (including doxrubicin) followed by 6 consolidation regimens, remission  - Diagnosed with R breast CA DCIS in 3/20 ER/PR+  Underwent lumpectomy Treated with tamoxifen  and XRT - Relapse in 9/22  - 9/23 found to have Stage IV CA in 3/24 started DOCEtaxel  + Trastuzumab  + Pertuzumab  (THP) q21d x 8 cycles / Trastuzumab  + Pertuzumab  q21d x 4 cycles  - remains on Herceptin /Perjeta / Tolerating well was having some SOB for 2-3 days after every infusion>>started on low dose spiro, symptoms improved  2. Potential chemotherapy-induced cardiomyopathy - LV function low end of normal range - ECHO:  7/24 EF 55-60% GLS -18.0  - ECHO 10/01/23 50-55% GLS -13.4 (poor tracking) - POCUS echo t12/27/24 in clinic EF stable, 50-55%  - Echo 01/30/24 EF 55-60% GLS -19.2%  - Echo 9/25 EF 50-55% GLS -14.1%  - Echo today 10/06/24 55-60% GLS -14.3% - Doing well NYHA I.  - Continue Entresto  to 97/103 bid - Continue Spiro 12.5  - Off Toprol  due to fatigue - EF and strain remain on low end of normal to normal range. Very stable. Continue H/P  - Continue surveillance echos. Due for repeat 2/26   3. Hypertension  - Blood pressure well controlled, not orthostatic   4. Fatigue/snoring  - unable to complete sleep study - will retry  5. Screening for CAD - Cardiac CT 04/26/24. CAC 0  6. Palpitations - correlates w/ dizzy episodes - will arrange 7 day zio    Plan to keep cardio-oncology appt in 2 months. If Zio w/ significant findings,  will bring back to APP clinic to address next steps.   Caffie Shed, PA-C  3:04 PM

## 2024-11-08 NOTE — Telephone Encounter (Signed)
 Patient called to request an appt  Reports she was told by oncology team to follow up with cardiology regarding dizziness.  Pt reports dizziness, light headiness, and light confusion.   No b/p readings available Seen in ER a few days ago-all negative  Pt is concerned cancer treatment has changed heart function hence symptoms above, would like to discuss with provider   Add on 12/ 15 @ 3

## 2024-11-08 NOTE — Assessment & Plan Note (Signed)
 02/11/19: RT lumpectomy: DICS Intermediate grade, Margins Neg, ER 90%, PR 70%; Lt Lumpectomy: UDH status post radiation, could not tolerate tamoxifen  recurrence: 08/15/2021:MRI surveillance detected Right breast calcifications 5 cm, Ant biopsy: IG DCIS with necrosis, ER 40%, PR 0%, Posterior biopsy: Intermediate grade DCIS with [facility]  11/29/2021: Right mastectomy: High-grade DCIS with microinvasion, ER 0%, PR 0%, HER2 positive, 0/1 lymph node negative  T1c MIC, N0: Stage Ia   Patient had second opinion at [facility] with Dr. Eleanora who agreed with her plan of no systemic chemotherapy   Breast cancer surveillance: 1.  Mammogram 08/22/2022: Left breast benign breast density category B  2.  CT CAP 12/09/2022: Multiple liver lesions indicative of cysts and hemangiomas new right hepatic lobe lesion favored to be hemangioma otherwise no evidence of metastatic disease.  Radiology recommended an MRI of the liver. 3.  Liver MRI 01/08/2023: 2 New rim-enhancing lesions 2.3 cm (was 1.6 cm on 12/09/2022), 1.8 cm (measured 1.5 cm), benign hemangioma 2.4 cm and 1.9 cm and 1.3 cm unchanged 4.  PET CT scan 01/22/2023: Liver: 2.5 cm and 2.4 cm lesions ------------------------------------------------------------------------------------------------------------------------------------------------- 01/31/2023: Liver lesion biopsy: Metastatic adenocarcinoma to the liver consistent with breast primary positive for CK7 and GATA3, ER 0%, PR 0%, Ki-67 25%, HER2 3+ positive, Caris molecular testing 02/21/2023: ER negative PR negative, HER2 amplified, ER positive TMB 4, BRCA negative, ESR 1 unmutated, PD-L1 negative, CPS: 1, PTEN IHC +2+ but mutation not detected   Current treatment: Completed 6 cycles of Taxotere  Herceptin  Perjeta  on 05/30/2023, currently on maintenance Herceptin  and Perjeta  (now being given intravenously because of skin irritation from injections)   Herceptin  Perjeta  toxicities:  Cardiomyopathy: Fatigue and palpitations on  Entresto  and metoprolol .  Follows cardiology Meningioma: Follows with Dr. Buckley, brain MRI  08/04/2023: Stable, new brain MRI has been ordered

## 2024-11-08 NOTE — Progress Notes (Signed)
 Zio patch placed onto patient.  All instructions and information reviewed with patient, they verbalize understanding with no questions.

## 2024-11-08 NOTE — Patient Instructions (Signed)
 Medication Changes: No Changes In Medications at this time.   Testing/Procedures:  Your provider has recommended that  you wear a Zio Patch for 7 days.  This monitor will record your heart rhythm for our review.  IF you have any symptoms while wearing the monitor please press the button.  If you have any issues with the patch or you notice a red or orange light on it please call the company at [phone].  Once you remove the patch please mail it back to the company as soon as possible so we can get the results.  Follow-Up in: IN February as scheduled with Dr. Bensimhon with an ECHO  At the [facility], you and your health needs are our priority. We have a designated team specialized in the treatment of Heart Failure. This Care Team includes your primary Heart Failure Specialized Cardiologist (physician), Advanced Practice Providers (APPs- Physician Assistants and Nurse Practitioners), and Pharmacist who all work together to provide you with the care you need, when you need it.   You may see any of the following providers on your designated Care Team at your next follow up:  Dr. Toribio Fuel Dr. Ezra Shuck Dr. Odis Brownie Greig Mosses, NP Caffie Shed, [place] [facility] [place], [place] Beckey Coe, NP Jordan Lee, NP Tinnie Redman, PharmD   Please be sure to bring in all your medications bottles to every appointment.   Need to Contact Us :  If you have any questions or concerns before your next appointment please send us  a message through [facility] or call our office at [phone].    TO LEAVE A MESSAGE FOR THE NURSE SELECT OPTION 2, PLEASE LEAVE A MESSAGE INCLUDING: YOUR NAME DATE OF BIRTH CALL BACK NUMBER REASON FOR CALL**this is important as we prioritize the call backs  YOU WILL RECEIVE A CALL BACK THE SAME DAY AS LONG AS YOU CALL BEFORE 4:00 PM

## 2024-11-11 ENCOUNTER — Encounter: Payer: Self-pay | Admitting: Hematology and Oncology

## 2024-11-22 ENCOUNTER — Telehealth: Payer: Self-pay

## 2024-11-22 ENCOUNTER — Telehealth: Admitting: Physician Assistant

## 2024-11-22 DIAGNOSIS — J019 Acute sinusitis, unspecified: Secondary | ICD-10-CM | POA: Diagnosis not present

## 2024-11-22 DIAGNOSIS — B9689 Other specified bacterial agents as the cause of diseases classified elsewhere: Secondary | ICD-10-CM | POA: Diagnosis not present

## 2024-11-22 MED ORDER — AMOXICILLIN-POT CLAVULANATE 875-125 MG PO TABS: 1.0000 | ORAL_TABLET | Freq: Two times a day (BID) | ORAL | 0 refills | Status: DC

## 2024-11-22 NOTE — Progress Notes (Signed)
 E-Visit for Sinus Problems  We are sorry that you are not feeling well.  Here is how we plan to help!  Based on what you have shared with me it looks like you have sinusitis.  Sinusitis is inflammation and infection in the sinus cavities of the head.  Based on your presentation I believe you most likely have Acute Bacterial Sinusitis.  This is an infection caused by bacteria and is treated with antibiotics. I have prescribed Augmentin 875mg /125mg  one tablet twice daily with food, for 7 days. You may use an oral decongestant such as Mucinex D or if you have glaucoma or high blood pressure use plain Mucinex. Saline nasal spray help and can safely be used as often as needed for congestion.  If you develop worsening sinus pain, fever or notice severe headache and vision changes, or if symptoms are not better after completion of antibiotic, please schedule an appointment with a health care provider.    Sinus infections are not as easily transmitted as other respiratory infection, however we still recommend that you avoid close contact with loved ones, especially the very young and elderly.  Remember to wash your hands thoroughly throughout the day as this is the number one way to prevent the spread of infection!  Home Care: Only take medications as instructed by your medical team. Complete the entire course of an antibiotic. Do not take these medications with alcohol. A steam or ultrasonic humidifier can help congestion.  You can place a towel over your head and breathe in the steam from hot water coming from a faucet. Avoid close contacts especially the very young and the elderly. Cover your mouth when you cough or sneeze. Always remember to wash your hands.  Get Help Right Away If: You develop worsening fever or sinus pain. You develop a severe head ache or visual changes. Your symptoms persist after you have completed your treatment plan.  Make sure you Understand these instructions. Will  watch your condition. Will get help right away if you are not doing well or get worse.  Your e-visit answers were reviewed by a board certified advanced clinical practitioner to complete your personal care plan.  Depending on the condition, your plan could have included both over the counter or prescription medications.  If there is a problem please reply  once you have received a response from your provider.  Your safety is important to us .  If you have drug allergies check your prescription carefully.    You can use MyChart to ask questions about today's visit, request a non-urgent call back, or ask for a work or school excuse for 24 hours related to this e-Visit. If it has been greater than 24 hours you will need to follow up with your provider, or enter a new e-Visit to address those concerns.  You will get an e-mail in the next two days asking about your experience.  I hope that your e-visit has been valuable and will speed your recovery. Thank you for using e-visits.  I have spent 5 minutes in review of e-visit questionnaire, review and updating patient chart, medical decision making and response to patient.   Joann Meadows Dickinson, PA-C

## 2024-11-22 NOTE — Telephone Encounter (Signed)
 S/w patient regarding ongoing cold symptoms. Patient reports that symptoms started about 5 days ago. Endorses non-productive cough, sore throat, hoarseness, sinus pressure, and ear pressure. No fever at this time. Patient only taking advil  q6h for symptom management.  Patient has been trying to get in touch with PCP, but unable to get appointment at this time. Case reviewed with Morna Kendall, NP. Patient advised to follow-up with virtual or in-person urgent care. Provided information via myChart message.  Patient advised to continue checking temperature, pushing fluids, and to try OTC cough medication for symptom management.  Patient verbalized an understanding of the information. All questions answered at time of call.

## 2024-11-25 ENCOUNTER — Other Ambulatory Visit (HOSPITAL_COMMUNITY): Payer: Self-pay | Admitting: Internal Medicine

## 2024-11-26 ENCOUNTER — Other Ambulatory Visit: Payer: Self-pay | Admitting: Hematology and Oncology

## 2024-11-26 ENCOUNTER — Telehealth: Payer: Self-pay | Admitting: *Deleted

## 2024-11-26 ENCOUNTER — Inpatient Hospital Stay: Attending: Hematology and Oncology

## 2024-11-26 VITALS — BP 146/92 | HR 66 | Temp 97.9°F | Resp 16 | Wt 177.0 lb

## 2024-11-26 DIAGNOSIS — C787 Secondary malignant neoplasm of liver and intrahepatic bile duct: Secondary | ICD-10-CM | POA: Diagnosis present

## 2024-11-26 DIAGNOSIS — Z79899 Other long term (current) drug therapy: Secondary | ICD-10-CM | POA: Diagnosis not present

## 2024-11-26 DIAGNOSIS — D0511 Intraductal carcinoma in situ of right breast: Secondary | ICD-10-CM | POA: Diagnosis present

## 2024-11-26 DIAGNOSIS — Z9011 Acquired absence of right breast and nipple: Secondary | ICD-10-CM | POA: Diagnosis not present

## 2024-11-26 DIAGNOSIS — Z9221 Personal history of antineoplastic chemotherapy: Secondary | ICD-10-CM | POA: Diagnosis not present

## 2024-11-26 DIAGNOSIS — Z923 Personal history of irradiation: Secondary | ICD-10-CM | POA: Diagnosis not present

## 2024-11-26 DIAGNOSIS — Z5112 Encounter for antineoplastic immunotherapy: Secondary | ICD-10-CM | POA: Diagnosis present

## 2024-11-26 DIAGNOSIS — D329 Benign neoplasm of meninges, unspecified: Secondary | ICD-10-CM | POA: Diagnosis not present

## 2024-11-26 DIAGNOSIS — Z170 Estrogen receptor positive status [ER+]: Secondary | ICD-10-CM | POA: Diagnosis not present

## 2024-11-26 DIAGNOSIS — C50919 Malignant neoplasm of unspecified site of unspecified female breast: Secondary | ICD-10-CM

## 2024-11-26 MED ORDER — DIPHENHYDRAMINE HCL 25 MG PO CAPS
50.0000 mg | ORAL_CAPSULE | Freq: Once | ORAL | Status: AC
  Administered 2024-11-26: 50 mg via ORAL
  Filled 2024-11-26: qty 2

## 2024-11-26 MED ORDER — TRASTUZUMAB-ANNS CHEMO 150 MG IV SOLR
6.0000 mg/kg | Freq: Once | INTRAVENOUS | Status: AC
  Administered 2024-11-26: 483 mg via INTRAVENOUS
  Filled 2024-11-26: qty 23

## 2024-11-26 MED ORDER — ACETAMINOPHEN 325 MG PO TABS
650.0000 mg | ORAL_TABLET | Freq: Once | ORAL | Status: AC
  Administered 2024-11-26: 650 mg via ORAL
  Filled 2024-11-26: qty 2

## 2024-11-26 MED ORDER — SODIUM CHLORIDE 0.9% FLUSH: 10.0000 mL | INTRAVENOUS | Status: DC | PRN

## 2024-11-26 MED ORDER — SODIUM CHLORIDE 0.9 % IV SOLN: INTRAVENOUS | Status: DC

## 2024-11-26 MED ORDER — SODIUM CHLORIDE 0.9 % IV SOLN
420.0000 mg | Freq: Once | INTRAVENOUS | Status: AC
  Administered 2024-11-26: 420 mg via INTRAVENOUS
  Filled 2024-11-26: qty 14

## 2024-11-26 NOTE — Patient Instructions (Signed)
 [facility] - A DEPT OF [facility]  Discharge Instructions: Thank you for choosing [facility] to provide your oncology and hematology care.   If you have a lab appointment with the Cancer Center, please go directly to the Cancer Center and check in at the registration area.   Wear comfortable clothing and clothing appropriate for easy access to any Portacath or PICC line.   We strive to give you quality time with your provider. You may need to reschedule your appointment if you arrive late (15 or more minutes).  Arriving late affects you and other patients whose appointments are after yours.  Also, if you miss three or more appointments without notifying the office, you may be dismissed from the clinic at the provider's discretion.      For prescription refill requests, have your pharmacy contact our office and allow 72 hours for refills to be completed.    Today you received the following chemotherapy and/or immunotherapy agents: Trastuzumab and Pertuzumab      To help prevent nausea and vomiting after your treatment, we encourage you to take your nausea medication as directed.  BELOW ARE SYMPTOMS THAT SHOULD BE REPORTED IMMEDIATELY: *FEVER GREATER THAN 100.4 F (38 C) OR HIGHER *CHILLS OR SWEATING *NAUSEA AND VOMITING THAT IS NOT CONTROLLED WITH YOUR NAUSEA MEDICATION *UNUSUAL SHORTNESS OF BREATH *UNUSUAL BRUISING OR BLEEDING *URINARY PROBLEMS (pain or burning when urinating, or frequent urination) *BOWEL PROBLEMS (unusual diarrhea, constipation, pain near the anus) TENDERNESS IN MOUTH AND THROAT WITH OR WITHOUT PRESENCE OF ULCERS (sore throat, sores in mouth, or a toothache) UNUSUAL RASH, SWELLING OR PAIN  UNUSUAL VAGINAL DISCHARGE OR ITCHING   Items with * indicate a potential emergency and should be followed up as soon as possible or go to the Emergency Department if any problems should occur.  Please show the CHEMOTHERAPY ALERT CARD  or IMMUNOTHERAPY ALERT CARD at check-in to the Emergency Department and triage nurse.  Should you have questions after your visit or need to cancel or reschedule your appointment, please contact [facility] - A DEPT OF [facility]  Dept: [phone]  and follow the prompts.  Office hours are 8:00 a.m. to 4:30 p.m. Monday - Friday. Please note that voicemails left after 4:00 p.m. may not be returned until the following business day.  We are closed weekends and major holidays. You have access to a nurse at all times for urgent questions. Please call the main number to the clinic Dept: [phone] and follow the prompts.   For any non-urgent questions, you may also contact your provider using MyChart. We now offer e-Visits for anyone 44 and older to request care online for non-urgent symptoms. For details visit [email or web address].   Also download the MyChart app! Go to the app store, search "MyChart", open the app, select [place], and log in with your MyChart username and password.

## 2024-11-26 NOTE — Telephone Encounter (Signed)
 Per I[place]ku, OK to treat while on Augmentin  for sinus infection. Pt is afebrile and no chills, only congestion.

## 2024-11-27 ENCOUNTER — Encounter: Payer: Self-pay | Admitting: Hematology and Oncology

## 2024-11-30 ENCOUNTER — Encounter: Payer: Self-pay | Admitting: Hematology and Oncology

## 2024-11-30 ENCOUNTER — Other Ambulatory Visit (HOSPITAL_COMMUNITY): Payer: Self-pay

## 2024-12-02 ENCOUNTER — Ambulatory Visit (HOSPITAL_COMMUNITY)
Admission: RE | Admit: 2024-12-02 | Discharge: 2024-12-02 | Disposition: A | Discharge status: Home / Self Care | Source: Ambulatory Visit | Attending: Hematology and Oncology | Admitting: Hematology and Oncology

## 2024-12-02 DIAGNOSIS — C787 Secondary malignant neoplasm of liver and intrahepatic bile duct: Secondary | ICD-10-CM | POA: Diagnosis present

## 2024-12-02 DIAGNOSIS — C50919 Malignant neoplasm of unspecified site of unspecified female breast: Secondary | ICD-10-CM | POA: Insufficient documentation

## 2024-12-02 MED ORDER — IOHEXOL 300 MG/ML  SOLN
100.0000 mL | Freq: Once | INTRAMUSCULAR | Status: AC | PRN
  Administered 2024-12-02: 100 mL via INTRAVENOUS

## 2024-12-03 ENCOUNTER — Other Ambulatory Visit: Payer: Self-pay | Admitting: *Deleted

## 2024-12-03 DIAGNOSIS — M25562 Pain in left knee: Secondary | ICD-10-CM

## 2024-12-03 NOTE — Progress Notes (Signed)
 Received call from pt with complaint of ongoing left knee pain x 1 month.  Pt denies recent injury or trauma.  Per MD pt needing to undergo left knee xray for further evaluation.  Orders placed, pt notified and verbalized understanding.

## 2024-12-06 ENCOUNTER — Encounter: Payer: Self-pay | Admitting: Hematology and Oncology

## 2024-12-08 ENCOUNTER — Encounter (HOSPITAL_BASED_OUTPATIENT_CLINIC_OR_DEPARTMENT_OTHER): Payer: Self-pay | Admitting: Cardiology

## 2024-12-08 DIAGNOSIS — G4733 Obstructive sleep apnea (adult) (pediatric): Secondary | ICD-10-CM

## 2024-12-13 NOTE — Procedures (Signed)
" ° ° °  SLEEP STUDY REPORT Patient Information Study Date: 12/08/2024 Patient Name: Joann Meadows Patient ID: [id number] Birth Date: [birth year] Age: 49 Gender: Female BMI: 29.8 (W=178 lb, H=5' 5'') Stopbang: 3 Referring Physician: Dan Bensimhon, MD  TEST DESCRIPTION: Home sleep apnea testing was completed using the WatchPat, a Type 1 device, utilizing peripheral arterial tonometry (PAT), chest movement, actigraphy, pulse oximetry, pulse rate, body position and snore. AHI was calculated with apnea and hypopnea using valid sleep time as the denominator. RDI includes apneas, hypopneas, and RERAs. The data acquired and the scoring of sleep and all associated events were performed in accordance with the recommended standards and specifications as outlined in the AASM Manual for the Scoring of Sleep and Associated Events 2.2.0 (2015).  FINDINGS:  1. Mild Obstructive Sleep Apnea with AHI 9.3/hr overall and moderate during REM sleep with REM AHi 21.9/hr.  2. No Central Sleep Apnea with pAHIc 0/hr.  3. Oxygen desaturations as low as 85%.  4. Moderate snoring was present. O2 sats were < 88% for 1.3 min.  5. Total sleep time was 6 hrs and 6 min.  6. 27.8% of total sleep time was spent in REM sleep.  7. Shortened sleep onset latency at 6 min.  8. Shortened REM sleep onset latency at 68 min.  9. Total awakenings were 2. 10. Arrhythmia detection: None  DIAGNOSIS: Mild Obstructive Sleep Apnea (G47.33) overall and Moderate Obstructive Sleep Apnea during REM sleep.  RECOMMENDATIONS: 1. Clinical correlation of these findings is necessary. The decision to treat obstructive sleep apnea (OSA) is usually based on the presence of apnea symptoms or the presence of associated medical conditions such as Hypertension, Congestive Heart Failure, Atrial Fibrillation or Obesity. The most common symptoms of OSA are snoring, gasping for breath while sleeping, daytime sleepiness and fatigue. 2. Initiating apnea  therapy is recommended given the presence of symptoms and/or associated conditions. Recommend proceeding with one of the following:  a. Auto-CPAP therapy with a pressure range of 5-20cm H2O.  b. An oral appliance (OA) that can be obtained from certain dentists with expertise in sleep medicine. These are primarily of use in non-obese patients with mild and moderate disease.  c. An ENT consultation which may be useful to look for specific causes of obstruction and possible treatment options.  d. If patient is intolerant to PAP therapy, consider referral to ENT for evaluation for hypoglossal nerve stimulator. 3. Close follow-up is necessary to ensure success with CPAP or oral appliance therapy for maximum benefit . 4. A follow-up oximetry study on CPAP is recommended to assess the adequacy of therapy and determine the need for supplemental oxygen or the potential need for Bi-level therapy. An arterial blood gas to determine the adequacy of baseline ventilation and oxygenation should also be considered. 5. Healthy sleep recommendations include: adequate nightly sleep (normal 7-9 hrs/night), avoidance of caffeine after noon and alcohol near bedtime, and maintaining a sleep environment that is cool, dark and quiet. 6. Weight loss for overweight patients is recommended. Even modest amounts of weight loss can significantly improve the severity of sleep apnea. 7. Snoring recommendations include: weight loss where appropriate, side sleeping, and avoidance of alcohol before bed. 8. Operation of motor vehicle should be avoided when sleepy.  Signature: Wilbert Bihari, MD; [facility]; Diplomat, American Board of Sleep Medicine Electronically Signed: 12/13/2024 6:07:45 PM  "

## 2024-12-14 ENCOUNTER — Ambulatory Visit (HOSPITAL_COMMUNITY)
Admission: RE | Admit: 2024-12-14 | Discharge: 2024-12-14 | Disposition: A | Discharge status: Home / Self Care | Source: Ambulatory Visit | Attending: Hematology and Oncology | Admitting: Hematology and Oncology

## 2024-12-14 DIAGNOSIS — M25562 Pain in left knee: Secondary | ICD-10-CM | POA: Insufficient documentation

## 2024-12-15 ENCOUNTER — Other Ambulatory Visit: Payer: Self-pay | Admitting: *Deleted

## 2024-12-15 NOTE — Progress Notes (Signed)
 This pt is coming in for Kanginti tomorrow and her ECHO is out of date 10/15 and she has another one scheduled for 2/16, per Gudena MD pt is okay to treat.

## 2024-12-15 NOTE — Progress Notes (Signed)
 Per MD okay to treat with echo from 09/08/24.

## 2024-12-16 ENCOUNTER — Inpatient Hospital Stay

## 2024-12-16 ENCOUNTER — Other Ambulatory Visit: Payer: Self-pay | Admitting: Hematology and Oncology

## 2024-12-16 ENCOUNTER — Inpatient Hospital Stay: Admitting: Hematology and Oncology

## 2024-12-16 VITALS — BP 135/97 | HR 68 | Resp 16

## 2024-12-16 VITALS — BP 138/89 | HR 78 | Temp 98.1°F | Resp 18 | Ht 65.0 in | Wt 175.0 lb

## 2024-12-16 DIAGNOSIS — C787 Secondary malignant neoplasm of liver and intrahepatic bile duct: Secondary | ICD-10-CM | POA: Diagnosis not present

## 2024-12-16 DIAGNOSIS — C50919 Malignant neoplasm of unspecified site of unspecified female breast: Secondary | ICD-10-CM

## 2024-12-16 DIAGNOSIS — M25562 Pain in left knee: Secondary | ICD-10-CM

## 2024-12-16 DIAGNOSIS — D0511 Intraductal carcinoma in situ of right breast: Secondary | ICD-10-CM | POA: Diagnosis not present

## 2024-12-16 MED ORDER — TRASTUZUMAB-ANNS CHEMO 150 MG IV SOLR
6.0000 mg/kg | Freq: Once | INTRAVENOUS | Status: AC
  Administered 2024-12-16: 483 mg via INTRAVENOUS
  Filled 2024-12-16: qty 23

## 2024-12-16 MED ORDER — ACETAMINOPHEN 325 MG PO TABS
650.0000 mg | ORAL_TABLET | Freq: Once | ORAL | Status: AC
  Administered 2024-12-16: 650 mg via ORAL
  Filled 2024-12-16: qty 2

## 2024-12-16 MED ORDER — DIPHENHYDRAMINE HCL 25 MG PO CAPS
50.0000 mg | ORAL_CAPSULE | Freq: Once | ORAL | Status: AC
  Administered 2024-12-16: 50 mg via ORAL
  Filled 2024-12-16: qty 2

## 2024-12-16 MED ORDER — SODIUM CHLORIDE 0.9 % IV SOLN
420.0000 mg | Freq: Once | INTRAVENOUS | Status: AC
  Administered 2024-12-16: 420 mg via INTRAVENOUS
  Filled 2024-12-16: qty 14

## 2024-12-16 MED ORDER — SODIUM CHLORIDE 0.9 % IV SOLN: INTRAVENOUS | Status: DC

## 2024-12-16 NOTE — Patient Instructions (Signed)
 [facility] - A DEPT OF [facility]  Discharge Instructions: Thank you for choosing [facility] to provide your oncology and hematology care.   If you have a lab appointment with the Cancer Center, please go directly to the Cancer Center and check in at the registration area.   Wear comfortable clothing and clothing appropriate for easy access to any Portacath or PICC line.   We strive to give you quality time with your provider. You may need to reschedule your appointment if you arrive late (15 or more minutes).  Arriving late affects you and other patients whose appointments are after yours.  Also, if you miss three or more appointments without notifying the office, you may be dismissed from the clinic at the provider's discretion.      For prescription refill requests, have your pharmacy contact our office and allow 72 hours for refills to be completed.    Today you received the following chemotherapy and/or immunotherapy agents: Trastuzumab -anns (Kanjinti ) & Pertuzumab  (Perjeta )    To help prevent nausea and vomiting after your treatment, we encourage you to take your nausea medication as directed.  BELOW ARE SYMPTOMS THAT SHOULD BE REPORTED IMMEDIATELY: *FEVER GREATER THAN 100.4 F (38 C) OR HIGHER *CHILLS OR SWEATING *NAUSEA AND VOMITING THAT IS NOT CONTROLLED WITH YOUR NAUSEA MEDICATION *UNUSUAL SHORTNESS OF BREATH *UNUSUAL BRUISING OR BLEEDING *URINARY PROBLEMS (pain or burning when urinating, or frequent urination) *BOWEL PROBLEMS (unusual diarrhea, constipation, pain near the anus) TENDERNESS IN MOUTH AND THROAT WITH OR WITHOUT PRESENCE OF ULCERS (sore throat, sores in mouth, or a toothache) UNUSUAL RASH, SWELLING OR PAIN  UNUSUAL VAGINAL DISCHARGE OR ITCHING   Items with * indicate a potential emergency and should be followed up as soon as possible or go to the Emergency Department if any problems should occur.  Please show the  CHEMOTHERAPY ALERT CARD or IMMUNOTHERAPY ALERT CARD at check-in to the Emergency Department and triage nurse.  Should you have questions after your visit or need to cancel or reschedule your appointment, please contact [facility] - A DEPT OF [facility]  Dept: [phone]  and follow the prompts.  Office hours are 8:00 a.m. to 4:30 p.m. Monday - Friday. Please note that voicemails left after 4:00 p.m. may not be returned until the following business day.  We are closed weekends and major holidays. You have access to a nurse at all times for urgent questions. Please call the main number to the clinic Dept: [phone] and follow the prompts.   For any non-urgent questions, you may also contact your provider using MyChart. We now offer e-Visits for anyone 36 and older to request care online for non-urgent symptoms. For details visit [email or web address].   Also download the MyChart app! Go to the app store, search MyChart, open the app, select [place], and log in with your MyChart username and password.

## 2024-12-16 NOTE — Progress Notes (Signed)
 Pt declined post-observation period for Perjeta .

## 2024-12-16 NOTE — Progress Notes (Signed)
 "  Patient Care Team: Regino Slater, MD as PCP - General (Family Medicine) [name], RN as Oncology Nurse Navigator [name], MD as Consulting Physician (Hematology and Oncology) Default, Provider, MD as Technician  DIAGNOSIS:  Encounter Diagnosis  Name Primary?   Breast cancer metastasized to liver (HCC) Yes    SUMMARY OF ONCOLOGIC HISTORY: Oncology History  Breast cancer metastasized to liver (HCC)  01/25/2019 Initial Diagnosis   Screening mammogram showed bilateral calcifications right breast 5 mm, pleomorphic.  Left breast 1.6 cm punctate, smaller group of calcifications spanning 3 mm.  Right lower outer quadrant biopsy revealed low-grade DCIS ER 90%, PR 70%; biopsy of left breast calcifications ALH and PASH   01/31/2019 Genetic Testing   ATM c.8495G>A and RECQL4 c.2587G>A VUS identified on the 9-gene STAT panel and Multi-cancer panel through [facility].  The STAT Breast cancer panel offered by [facility] includes sequencing and rearrangement analysis for the following 9 genes:  ATM, BRCA1, BRCA2, CDH1, CHEK2, PALB2, PTEN, STK11 and TP53.   The Multi-Gene Panel offered by [facility] includes sequencing and/or deletion duplication testing of the following 84 genes: AIP, ALK, APC, ATM, AXIN2,BAP1,  BARD1, BLM, BMPR1A, BRCA1, BRCA2, BRIP1, CASR, CDC73, CDH1, CDK4, CDKN1B, CDKN1C, CDKN2A (p14ARF), CDKN2A (p16INK4a), CEBPA, CHEK2, CTNNA1, DICER1, DIS3L2, EGFR (c.2369C>T, p.Thr790Met variant only), EPCAM (Deletion/duplication testing only), FH, FLCN, GATA2, GPC3, GREM1 (Promoter region deletion/duplication testing only), HOXB13 (c.251G>A, p.Gly84Glu), HRAS, KIT, MAX, MEN1, MET, MITF (c.952G>A, p.Glu318Lys variant only), MLH1, MSH2, MSH3, MSH6, MUTYH, NBN, NF1, NF2, NTHL1, PALB2, PDGFRA, PHOX2B, PMS2, POLD1, POLE, POT1, PRKAR1A, PTCH1, PTEN, RAD50, RAD51C, RAD51D, RB1, RECQL4, RET, RUNX1, SDHAF2, SDHA (sequence changes only), SDHB, SDHC, SDHD, SMAD4, SMARCA4, SMARCB1, SMARCE1, STK11, SUFU, TERC,  TERT, TMEM127, TP53, TSC1, TSC2, VHL, WRN and WT1.  he report date is February 02, 2019.   02/11/2019 Surgery   RT lumpectomy: DICS Intermediate grade, Margins Neg, ER 90%, PR 70%; Lt Lumpectomy: UDH   02/26/2019 -  Anti-estrogen oral therapy   Tamoxifen  20mg  daily, stopped because of depression and facial numbness   05/20/2019 - 06/17/2019 Radiation Therapy   Adjuvant XRT   08/15/2021 Relapse/Recurrence   MRI surveillance detected Right breast calcifications 5 cm, Ant biopsy: IG DCIS with necrosis, ER 40%, PR 0%, Posterior biopsy: Intermediate grade DCIS with [facility]   08/19/2022 Cancer Staging   Staging form: Breast, AJCC 8th Edition - Clinical: Stage IV (cT13mi, cNX, cM1, G3, ER: Unknown, PR: Unknown, HER2: Unknown) - Signed by [name], MD on 02/06/2023 Histologic grading system: 3 grade system   02/14/2023 - 01/15/2024 Chemotherapy   Patient is on Treatment Plan : BREAST DOCEtaxel  + Trastuzumab  + Pertuzumab  (THP) q21d x 8 cycles / Trastuzumab  + Pertuzumab  q21d x 4 cycles     02/05/2024 -  Chemotherapy   Patient is on Treatment Plan : BREAST Trastuzumab   + Pertuzumab  q21d x 13 cycles     AML (acute myeloid leukemia) (HCC)  09/1998 Initial Diagnosis   AML (acute myeloid leukemia) (HCC) treated with 2 induction regimens followed by 6 consolidation regimens, remission     CHIEF COMPLIANT: Follow-up of metastatic breast cancer on Herceptin  and Perjeta  maintenance  HISTORY OF PRESENT ILLNESS:  History of Present Illness Joann Meadows is a 49 year old female with metastatic HER2-positive breast cancer to the liver, currently in remission, who presents for oncology follow-up.  She remains on maintenance trastuzumab , started March 2024 after six cycles of docetaxel , trastuzumab , and pertuzumab , with infusions ongoing since July 2024 and the next due February 2026. She  is tolerating therapy without new cancer-related symptoms and asks about appropriate duration and dosing interval of  trastuzumab , including whether treatment can be stopped after several years.  Recent CT and PET imaging were completed. She is concerned whether residual liver findings represent scar versus active disease but has no new hepatic or systemic symptoms. She is scheduled for a [facility] ctDNA test and a nurse visit tomorrow.  She has persistent non-sharp left knee discomfort since October 2025, worse at rest and at night, without injury or swelling. A left knee radiograph was obtained two days ago and results are pending. She has not yet seen orthopedics but is considering referral.  Since a cruise in late 2025, she had prolonged sinus symptoms through January 2026 requiring two antibiotic courses. She is followed by ENT and will discuss possible septal surgery for a deviated septum. She stopped Claritin and zinc but continues vitamin D, vitamin C, and B12.  Menses resumed in November 2025 with heavy bleeding in November, December, and January. She has OB/GYN follow-up and a pelvic ultrasound scheduled. She denies other abnormal bruising or bleeding.  Recent echocardiogram showed LVEF 54%. Holter monitoring showed tachycardia and premature atrial contractions. She has not yet seen cardiology and denies chest pain or dyspnea. She stopped magnesium due to diarrhea, and current magnesium levels are stable.       ALLERGIES:  is allergic to levaquin  [levofloxacin ], tamoxifen  citrate, and tape.  MEDICATIONS:  Current Outpatient Medications  Medication Sig Dispense Refill   acetaminophen  (TYLENOL ) 325 MG tablet Take 650 mg by mouth every 6 (six) hours as needed.     ALPRAZolam (XANAX) 0.25 MG tablet      cholecalciferol (VITAMIN D3) 25 MCG (1000 UNIT) tablet Take 2,000 Units by mouth daily.     Cyanocobalamin  (B-12) 500 MCG TABS 500 mg.     fluticasone (FLONASE) 50 MCG/ACT nasal spray Place 1 spray into both nostrils daily.     Ibuprofen  (ADVIL ) 200 MG CAPS Take 1 capsule by mouth as needed.      Magnesium Oxide -Mg Supplement 200 MG TABS Take 200 mg by mouth daily.     phenylephrine  (SUDAFED PE) 10 MG TABS tablet Take 10 mg by mouth as needed.     rosuvastatin (CRESTOR) 5 MG tablet Take 5 mg by mouth daily.     sacubitril -valsartan  (ENTRESTO ) 97-103 MG TAKE 1 TABLET BY MOUTH TWICE A DAY 60 tablet 11   spironolactone  (ALDACTONE ) 25 MG tablet Take 0.5 tablets (12.5 mg total) by mouth daily. 15 tablet 3   Zinc-Vitamin C (ZINC-A-COLD/VITAMIN C MT) Use as directed 1 capsule in the mouth or throat daily.     loratadine (CLARITIN) 10 MG tablet 1 tablet Orally Once a day (Patient not taking: Reported on 12/16/2024)     No current facility-administered medications for this visit.    PHYSICAL EXAMINATION: ECOG PERFORMANCE STATUS: 1 - Symptomatic but completely ambulatory  Vitals:   12/16/24 0900  BP: 138/89  Pulse: 78  Resp: 18  Temp: 98.1 F (36.7 C)  SpO2: 99%   Filed Weights   12/16/24 0900  Weight: 175 lb (79.4 kg)    Physical Exam MUSCULOSKELETAL: Knee normal  (exam performed in the presence of a chaperone)  LABORATORY DATA:  I have reviewed the data as listed    Latest Ref Rng & Units 11/01/2024    3:00 PM 09/23/2024    9:34 AM 09/02/2024    7:58 AM  CMP  Glucose 70 - 99 mg/dL 96  125  100   BUN 6 - 20 mg/dL 16  13  21    Creatinine 0.44 - 1.00 mg/dL 9.33  9.28  9.27   Sodium 135 - 145 mmol/L 138  139  138   Potassium 3.5 - 5.1 mmol/L 3.6  3.7  4.0   Chloride 98 - 111 mmol/L 103  107  107   CO2 22 - 32 mmol/L 26  26  25    Calcium 8.9 - 10.3 mg/dL 8.7  8.9  9.3   Total Protein 6.5 - 8.1 g/dL 6.9  6.9  7.1   Total Bilirubin 0.0 - 1.2 mg/dL 0.2  0.3  0.3   Alkaline Phos 38 - 126 U/L 70  67  72   AST 15 - 41 U/L 13  17  14    ALT 0 - 44 U/L 16  19  15      Lab Results  Component Value Date   WBC 9.1 11/01/2024   HGB 12.6 11/01/2024   HCT 38.7 11/01/2024   MCV 89.8 11/01/2024   PLT 252 11/01/2024   NEUTROABS 3.1 09/23/2024    ASSESSMENT & PLAN:  Breast  cancer metastasized to liver (HCC) 02/11/19: RT lumpectomy: DICS Intermediate grade, Margins Neg, ER 90%, PR 70%; Lt Lumpectomy: UDH status post radiation, could not tolerate tamoxifen  recurrence: 08/15/2021:MRI surveillance detected Right breast calcifications 5 cm, Ant biopsy: IG DCIS with necrosis, ER 40%, PR 0%, Posterior biopsy: Intermediate grade DCIS with [facility]  11/29/2021: Right mastectomy: High-grade DCIS with microinvasion, ER 0%, PR 0%, HER2 positive, 0/1 lymph node negative  T1c MIC, N0: Stage Ia   Patient had second opinion at [facility] with Dr. Eleanora who agreed with her plan of no systemic chemotherapy   Breast cancer surveillance: 1.  Mammogram 08/22/2022: Left breast benign breast density category B  2.  CT CAP 12/09/2022: Multiple liver lesions indicative of cysts and hemangiomas new right hepatic lobe lesion favored to be hemangioma otherwise no evidence of metastatic disease.  Radiology recommended an MRI of the liver. 3.  Liver MRI 01/08/2023: 2 New rim-enhancing lesions 2.3 cm (was 1.6 cm on 12/09/2022), 1.8 cm (measured 1.5 cm), benign hemangioma 2.4 cm and 1.9 cm and 1.3 cm unchanged 4.  PET CT scan 01/22/2023: Liver: 2.5 cm and 2.4 cm lesions ------------------------------------------------------------------------------------------------------------------------------------------------- 01/31/2023: Liver lesion biopsy: Metastatic adenocarcinoma to the liver consistent with breast primary positive for CK7 and GATA3, ER 0%, PR 0%, Ki-67 25%, HER2 3+ positive, [facility] molecular testing 02/21/2023: ER negative PR negative, HER2 amplified, ER positive TMB 4, BRCA negative, ESR 1 unmutated, PD-L1 negative, CPS: 1, PTEN IHC +2+ but mutation not detected   Current treatment: Completed 6 cycles of Taxotere  Herceptin  Perjeta  on 05/30/2023, currently on maintenance Herceptin  and Perjeta  (now being given intravenously because of skin irritation from injections)   Herceptin  Perjeta  toxicities:   Cardiomyopathy: Fatigue and palpitations on Entresto  and metoprolol .  Follows cardiology Meningioma: Follows with Dr. Buckley, brain MRI  08/04/2023: Stable, new brain MRI has been ordered    MRI 05/05/2024: stable small hypervascular liver lesions CT CAP 05/17/24: Small lung nodules unchanged, benign liver cysts, no lymphadenopathy or met disease PET/CT 08/20/2024: Previous liver lesion not conspicuous.  New focal activity right lower quadrant distal ileal small bowel loop indeterminate.  Stable lung nodules without PET uptake  CT CAP 12/02/2024: Multiple liver lesions unchanged.    Knee pain: Patient will be seeing orthopedics. Return to clinic every 3 weeks for Herceptin  and Perjeta  and I will see her  every 6 weeks.     No orders of the defined types were placed in this encounter.  The patient has a good understanding of the overall plan. she agrees with it. she will call with any problems that may develop before the next visit here.  I personally spent a total of 30 minutes in the care of the patient today including preparing to see the patient, getting/reviewing separately obtained history, performing a medically appropriate exam/evaluation, counseling and educating, placing orders, referring and communicating with other health care professionals, documenting clinical information in the EHR, independently interpreting results, communicating results, and coordinating care.   Viinay K Sadaf Przybysz, MD 12/16/24    "

## 2024-12-16 NOTE — Assessment & Plan Note (Signed)
 02/11/19: RT lumpectomy: DICS Intermediate grade, Margins Neg, ER 90%, PR 70%; Lt Lumpectomy: UDH status post radiation, could not tolerate tamoxifen  recurrence: 08/15/2021:MRI surveillance detected Right breast calcifications 5 cm, Ant biopsy: IG DCIS with necrosis, ER 40%, PR 0%, Posterior biopsy: Intermediate grade DCIS with [facility]  11/29/2021: Right mastectomy: High-grade DCIS with microinvasion, ER 0%, PR 0%, HER2 positive, 0/1 lymph node negative  T1c MIC, N0: Stage Ia   Patient had second opinion at [facility] with Dr. Eleanora who agreed with her plan of no systemic chemotherapy   Breast cancer surveillance: 1.  Mammogram 08/22/2022: Left breast benign breast density category B  2.  CT CAP 12/09/2022: Multiple liver lesions indicative of cysts and hemangiomas new right hepatic lobe lesion favored to be hemangioma otherwise no evidence of metastatic disease.  Radiology recommended an MRI of the liver. 3.  Liver MRI 01/08/2023: 2 New rim-enhancing lesions 2.3 cm (was 1.6 cm on 12/09/2022), 1.8 cm (measured 1.5 cm), benign hemangioma 2.4 cm and 1.9 cm and 1.3 cm unchanged 4.  PET CT scan 01/22/2023: Liver: 2.5 cm and 2.4 cm lesions ------------------------------------------------------------------------------------------------------------------------------------------------- 01/31/2023: Liver lesion biopsy: Metastatic adenocarcinoma to the liver consistent with breast primary positive for CK7 and GATA3, ER 0%, PR 0%, Ki-67 25%, HER2 3+ positive, [facility] molecular testing 02/21/2023: ER negative PR negative, HER2 amplified, ER positive TMB 4, BRCA negative, ESR 1 unmutated, PD-L1 negative, CPS: 1, PTEN IHC +2+ but mutation not detected   Current treatment: Completed 6 cycles of Taxotere  Herceptin  Perjeta  on 05/30/2023, currently on maintenance Herceptin  and Perjeta  (now being given intravenously because of skin irritation from injections)   Herceptin  Perjeta  toxicities:  Cardiomyopathy: Fatigue and palpitations on  Entresto  and metoprolol .  Follows cardiology Meningioma: Follows with Dr. Buckley, brain MRI  08/04/2023: Stable, new brain MRI has been ordered    MRI 05/05/2024: stable small hypervascular liver lesions CT CAP 05/17/24: Small lung nodules unchanged, benign liver cysts, no lymphadenopathy or met disease PET/CT 08/20/2024: Previous liver lesion not conspicuous.  New focal activity right lower quadrant distal ileal small bowel loop indeterminate.  Stable lung nodules without PET uptake  CT CAP 12/02/2024: Multiple liver lesions unchanged.   Radiology counseling: Responding very well to current treatment.   Patient will call her gastroenterologist for the ileal lesion.:  She has an appointment next week. GYN did an evaluation of uterus and ovaries and felt it was not of any consequence.   Leg cramps: Currently taking 400 mg of magnesium supplement.   Return to clinic every 3 weeks for Herceptin  and Perjeta  and I will see her every 6 weeks.

## 2024-12-17 ENCOUNTER — Ambulatory Visit (INDEPENDENT_AMBULATORY_CARE_PROVIDER_SITE_OTHER): Admitting: Otolaryngology

## 2024-12-17 VITALS — BP 126/81 | HR 74 | Ht 65.0 in | Wt 175.0 lb

## 2024-12-17 DIAGNOSIS — J343 Hypertrophy of nasal turbinates: Secondary | ICD-10-CM | POA: Diagnosis not present

## 2024-12-17 DIAGNOSIS — J3489 Other specified disorders of nose and nasal sinuses: Secondary | ICD-10-CM

## 2024-12-17 DIAGNOSIS — J338 Other polyp of sinus: Secondary | ICD-10-CM

## 2024-12-17 DIAGNOSIS — J342 Deviated nasal septum: Secondary | ICD-10-CM

## 2024-12-17 DIAGNOSIS — H6981 Other specified disorders of Eustachian tube, right ear: Secondary | ICD-10-CM | POA: Diagnosis not present

## 2024-12-17 DIAGNOSIS — J320 Chronic maxillary sinusitis: Secondary | ICD-10-CM | POA: Diagnosis not present

## 2024-12-17 DIAGNOSIS — H6521 Chronic serous otitis media, right ear: Secondary | ICD-10-CM

## 2024-12-17 NOTE — Progress Notes (Signed)
 Patient ID: Joann Meadows, female   DOB: [birth year], 49 y.o.   MRN: [id number]  Follow up: Right ear eustachian tube dysfunction, chronic nasal obstruction, recurrent sinusitis  History of Present Illness Joann Meadows is a 49 year old female with chronic nasal obstruction and recurrent sinus infections who presents for evaluation of persistent sinonasal and right ear symptoms.  She has longstanding, severe nasal obstruction characterized by markedly limited airflow and chronic congestion. She consistently experiences difficulty breathing through one side of her nose and reports persistent swelling and discomfort, with symptoms exacerbated by upper respiratory infections and allergies. She describes having minimal intranasal space and has adapted to reduced nasal breathing capacity.  She experiences recurrent acute sinus infections, most recently in December 2025, requiring two courses of antibiotics. These episodes occur every few months and are associated with increased nasal pressure and pain. Persistent sinonasal symptoms are present between infectious episodes.  MRI of the sinuses performed on November 01, 2024 demonstrated a severely deviated nasal septum with a prominent septal spur contacting the lateral wall, significant bilateral inferior turbinate hypertrophy, and bilateral maxillary disease with mucosal edema and polyposis. She underwent adenoidectomy at age 35.  She has chronic right-sided ear symptoms, particularly following upper respiratory infections. She notes that her right ear develops infection after worsening nasal symptoms, with episodes frequently related to increased nasal congestion.  She currently uses intranasal fluticasone, but feels the medication does not reach the affected areas due to the severity of her nasal obstruction.  Exam: General: Communicates without difficulty, well nourished, no acute distress. Head: Normocephalic, no evidence injury, no tenderness,  facial buttresses intact without stepoff. Face/sinus: No tenderness to palpation and percussion. Facial movement is normal and symmetric. Eyes: PERRL, EOMI. No scleral icterus, conjunctivae clear. Neuro: CN II exam reveals vision grossly intact.  No nystagmus at any point of gaze. Ears: Auricles well formed without lesions.  Ear canals are intact without mass or lesion.  No erythema or edema is appreciated.  The TMs are intact without fluid. Nose: External evaluation reveals normal support and skin without lesions.  Dorsum is intact.  Anterior rhinoscopy reveals congested mucosa over anterior aspect of inferior turbinates and deviated septum.  Oral:  Oral cavity and oropharynx are intact, symmetric, without erythema or edema.  Mucosa is moist without lesions. Neck: Full range of motion without pain.  There is no significant lymphadenopathy.  No masses palpable.  Thyroid  bed within normal limits to palpation.  Parotid glands and submandibular glands equal bilaterally without mass.  Trachea is midline. Neuro:  CN 2-12 grossly intact.    Procedure:  Flexible Nasal Endoscopy: Description: Risks, benefits, and alternatives of flexible endoscopy were explained to the patient.  Specific mention was made of the risk of throat numbness with difficulty swallowing, possible bleeding from the nose and mouth, and pain from the procedure.  The patient gave oral consent to proceed.  The flexible scope was inserted into the right nasal cavity.  Endoscopy of the interior nasal cavity, superior, inferior, and middle meatus was performed. The sphenoid-ethmoid recess was examined. Edematous mucosa was noted.  Nasal septal deviation noted. Olfactory cleft was clear.  Nasopharynx was clear.  Turbinates were hypertrophied but without mass.  The procedure was repeated on the contralateral side with similar findings.  The patient tolerated the procedure well.   Assessment & Plan Chronic nasal obstruction, secondary to severe nasal  septal deviation and bilateral inferior turbinate hypertrophy. Chronic, severe nasal obstruction due to markedly deviated septum and significant bilateral  inferior turbinate hypertrophy, resulting in minimal nasal airway space. This longstanding anatomic abnormality is the primary contributor to recurrent nasal congestion, sinus pressure, and discomfort, refractory to medical management, and significantly impairs quality of life.  - The physical exam findings and the MRI images are extensively reviewed with the patient. -The treatment options are discussed.  The options include continuing medical management versus surgical intervention with septoplasty, bilateral inferior turbinate reduction, and bilateral maxillary antrostomy with polyp removal.  The risks, benefits, alternatives, and details of the procedures are extensively discussed.  Questions are invited and answered. - Discussed expected postoperative course, including mild discomfort, use of nasal splints, and typical recovery course. - Instructed to avoid heavy lifting for three days postoperatively. - The patient would like to proceed with the procedures.  Bilateral chronic maxillary sinusitis and polyposis The patient was previously treated with multiple courses of antibiotics, allergy medications, and steroid nasal spray without improvement in her symptoms. - The patient will benefit from surgical intervention with bilateral endoscopic maxillary antrostomy and polyp removal.  Right ear eustachian tube dysfunction with recurrent effusion Chronic right eustachian tube dysfunction with effusion, exacerbated by nasal and nasopharyngeal congestion secondary to septal deviation and turbinate hypertrophy.  - Continue with daily Flonase nasal sprays and nasal saline irrigation. - Anticipated improvement in eustachian tube dysfunction and related symptoms following nasal surgery.

## 2024-12-24 ENCOUNTER — Telehealth: Payer: Self-pay | Admitting: *Deleted

## 2024-12-24 DIAGNOSIS — G4733 Obstructive sleep apnea (adult) (pediatric): Secondary | ICD-10-CM

## 2024-12-24 DIAGNOSIS — R0683 Snoring: Secondary | ICD-10-CM

## 2024-12-24 DIAGNOSIS — R5383 Other fatigue: Secondary | ICD-10-CM

## 2024-12-24 DIAGNOSIS — R002 Palpitations: Secondary | ICD-10-CM

## 2024-12-24 NOTE — Telephone Encounter (Signed)
 The patient has been notified of the result and verbalized understanding.  All questions (if any) were answered. Joshua Dalton Seip, CMA 12/24/2024 3:03 PM     Patient states she has a nasal surgery coming up soon and she wants to wait and talk to her cardiologist before deciding to move forward with getting her cpap. She will call our office back when she is ready to move forward.

## 2024-12-24 NOTE — Telephone Encounter (Signed)
-----   Message from Wilbert Bihari, MD sent at 12/13/2024  6:10 PM EST ----- Please let patient know that they have sleep apnea and recommend treating with CPAP.  Please order an auto CPAP from 4-15cm H2O with heated humidity and mask of choice.  Order overnight pulse ox on CPAP.  Followup with me in 6 weeks.

## 2024-12-31 ENCOUNTER — Encounter: Payer: Self-pay | Admitting: *Deleted

## 2025-01-06 ENCOUNTER — Inpatient Hospital Stay: Attending: Hematology and Oncology

## 2025-01-10 ENCOUNTER — Ambulatory Visit (HOSPITAL_COMMUNITY)

## 2025-01-10 ENCOUNTER — Ambulatory Visit (HOSPITAL_COMMUNITY): Admitting: Internal Medicine

## 2025-01-17 ENCOUNTER — Encounter (HOSPITAL_BASED_OUTPATIENT_CLINIC_OR_DEPARTMENT_OTHER): Payer: Self-pay

## 2025-01-17 ENCOUNTER — Ambulatory Visit (HOSPITAL_BASED_OUTPATIENT_CLINIC_OR_DEPARTMENT_OTHER): Admit: 2025-01-17 | Admitting: Otolaryngology

## 2025-01-20 ENCOUNTER — Ambulatory Visit (INDEPENDENT_AMBULATORY_CARE_PROVIDER_SITE_OTHER): Admitting: Otolaryngology

## 2025-01-27 ENCOUNTER — Inpatient Hospital Stay

## 2025-01-27 ENCOUNTER — Inpatient Hospital Stay: Attending: Hematology and Oncology | Admitting: Hematology and Oncology

## 2025-01-27 ENCOUNTER — Inpatient Hospital Stay: Attending: Hematology and Oncology

## 2025-08-02 ENCOUNTER — Other Ambulatory Visit

## 2025-08-08 ENCOUNTER — Ambulatory Visit: Admitting: Internal Medicine
